# Patient Record
Sex: Female | Born: 1945 | Race: White | Hispanic: No | Marital: Married | Smoking: Never smoker
Health system: Southern US, Community
[De-identification: ages and names within clinical notes are randomized; demographics above are authoritative.]

## PROBLEM LIST (undated history)

## (undated) DIAGNOSIS — G5790 Unspecified mononeuropathy of unspecified lower limb: Secondary | ICD-10-CM

## (undated) DIAGNOSIS — E669 Obesity, unspecified: Secondary | ICD-10-CM

## (undated) DIAGNOSIS — K219 Gastro-esophageal reflux disease without esophagitis: Secondary | ICD-10-CM

## (undated) DIAGNOSIS — I10 Essential (primary) hypertension: Secondary | ICD-10-CM

## (undated) DIAGNOSIS — R51 Headache: Secondary | ICD-10-CM

## (undated) DIAGNOSIS — K5790 Diverticulosis of intestine, part unspecified, without perforation or abscess without bleeding: Secondary | ICD-10-CM

## (undated) DIAGNOSIS — E039 Hypothyroidism, unspecified: Secondary | ICD-10-CM

## (undated) DIAGNOSIS — C50919 Malignant neoplasm of unspecified site of unspecified female breast: Secondary | ICD-10-CM

## (undated) DIAGNOSIS — K635 Polyp of colon: Secondary | ICD-10-CM

## (undated) DIAGNOSIS — E785 Hyperlipidemia, unspecified: Secondary | ICD-10-CM

## (undated) DIAGNOSIS — I4891 Unspecified atrial fibrillation: Secondary | ICD-10-CM

## (undated) DIAGNOSIS — M1990 Unspecified osteoarthritis, unspecified site: Secondary | ICD-10-CM

## (undated) DIAGNOSIS — Z8679 Personal history of other diseases of the circulatory system: Secondary | ICD-10-CM

## (undated) DIAGNOSIS — N281 Cyst of kidney, acquired: Secondary | ICD-10-CM

## (undated) DIAGNOSIS — K760 Fatty (change of) liver, not elsewhere classified: Secondary | ICD-10-CM

## (undated) DIAGNOSIS — C73 Malignant neoplasm of thyroid gland: Secondary | ICD-10-CM

## (undated) DIAGNOSIS — R519 Headache, unspecified: Secondary | ICD-10-CM

## (undated) DIAGNOSIS — K589 Irritable bowel syndrome without diarrhea: Secondary | ICD-10-CM

## (undated) DIAGNOSIS — I499 Cardiac arrhythmia, unspecified: Secondary | ICD-10-CM

## (undated) HISTORY — DX: Essential (primary) hypertension: I10

## (undated) HISTORY — DX: Polyp of colon: K63.5

## (undated) HISTORY — DX: Cyst of kidney, acquired: N28.1

## (undated) HISTORY — DX: Unspecified atrial fibrillation: I48.91

## (undated) HISTORY — DX: Obesity, unspecified: E66.9

## (undated) HISTORY — PX: TONSILLECTOMY: SUR1361

## (undated) HISTORY — PX: COLONOSCOPY W/ POLYPECTOMY: SHX1380

## (undated) HISTORY — PX: CHOLECYSTECTOMY: SHX55

## (undated) HISTORY — DX: Hyperlipidemia, unspecified: E78.5

## (undated) HISTORY — DX: Fatty (change of) liver, not elsewhere classified: K76.0

## (undated) HISTORY — PX: JOINT REPLACEMENT: SHX530

## (undated) HISTORY — DX: Unspecified mononeuropathy of unspecified lower limb: G57.90

## (undated) HISTORY — DX: Personal history of other diseases of the circulatory system: Z86.79

## (undated) HISTORY — DX: Diverticulosis of intestine, part unspecified, without perforation or abscess without bleeding: K57.90

## (undated) HISTORY — DX: Malignant neoplasm of unspecified site of unspecified female breast: C50.919

## (undated) HISTORY — DX: Hypothyroidism, unspecified: E03.9

## (undated) HISTORY — DX: Irritable bowel syndrome, unspecified: K58.9

---

## 1997-09-20 ENCOUNTER — Ambulatory Visit (HOSPITAL_COMMUNITY): Admission: RE | Admit: 1997-09-20 | Discharge: 1997-09-20 | Payer: Self-pay | Admitting: Cardiology

## 1998-04-09 HISTORY — PX: BREAST LUMPECTOMY: SHX2

## 1998-08-29 ENCOUNTER — Other Ambulatory Visit: Admission: RE | Admit: 1998-08-29 | Discharge: 1998-08-29 | Payer: Self-pay | Admitting: Obstetrics and Gynecology

## 1998-09-13 ENCOUNTER — Encounter: Payer: Self-pay | Admitting: Obstetrics and Gynecology

## 1998-09-13 ENCOUNTER — Ambulatory Visit (HOSPITAL_COMMUNITY): Admission: RE | Admit: 1998-09-13 | Discharge: 1998-09-13 | Payer: Self-pay | Admitting: Obstetrics and Gynecology

## 1998-11-07 ENCOUNTER — Ambulatory Visit (HOSPITAL_BASED_OUTPATIENT_CLINIC_OR_DEPARTMENT_OTHER): Admission: RE | Admit: 1998-11-07 | Discharge: 1998-11-07 | Payer: Self-pay | Admitting: Surgery

## 1998-11-25 ENCOUNTER — Ambulatory Visit (HOSPITAL_BASED_OUTPATIENT_CLINIC_OR_DEPARTMENT_OTHER): Admission: RE | Admit: 1998-11-25 | Discharge: 1998-11-25 | Payer: Self-pay | Admitting: Surgery

## 1998-12-05 ENCOUNTER — Encounter: Admission: RE | Admit: 1998-12-05 | Discharge: 1999-03-05 | Payer: Self-pay | Admitting: Radiation Oncology

## 1999-02-17 ENCOUNTER — Other Ambulatory Visit: Admission: RE | Admit: 1999-02-17 | Discharge: 1999-02-17 | Payer: Self-pay | Admitting: Obstetrics and Gynecology

## 1999-03-08 ENCOUNTER — Encounter: Admission: RE | Admit: 1999-03-08 | Discharge: 1999-06-06 | Payer: Self-pay | Admitting: Radiation Oncology

## 1999-09-01 ENCOUNTER — Other Ambulatory Visit: Admission: RE | Admit: 1999-09-01 | Discharge: 1999-09-01 | Payer: Self-pay | Admitting: Obstetrics and Gynecology

## 1999-11-27 ENCOUNTER — Encounter (INDEPENDENT_AMBULATORY_CARE_PROVIDER_SITE_OTHER): Payer: Self-pay | Admitting: Specialist

## 1999-11-27 ENCOUNTER — Other Ambulatory Visit: Admission: RE | Admit: 1999-11-27 | Discharge: 1999-11-27 | Payer: Self-pay | Admitting: Obstetrics and Gynecology

## 2000-07-31 ENCOUNTER — Ambulatory Visit (HOSPITAL_BASED_OUTPATIENT_CLINIC_OR_DEPARTMENT_OTHER): Admission: RE | Admit: 2000-07-31 | Discharge: 2000-07-31 | Payer: Self-pay | Admitting: *Deleted

## 2000-10-09 ENCOUNTER — Encounter: Payer: Self-pay | Admitting: Hematology and Oncology

## 2000-10-09 ENCOUNTER — Ambulatory Visit (HOSPITAL_COMMUNITY): Admission: RE | Admit: 2000-10-09 | Discharge: 2000-10-09 | Payer: Self-pay | Admitting: Hematology and Oncology

## 2000-10-30 ENCOUNTER — Encounter: Admission: RE | Admit: 2000-10-30 | Discharge: 2000-10-30 | Payer: Self-pay | Admitting: Hematology and Oncology

## 2000-10-30 ENCOUNTER — Encounter: Payer: Self-pay | Admitting: Hematology and Oncology

## 2000-12-17 ENCOUNTER — Other Ambulatory Visit: Admission: RE | Admit: 2000-12-17 | Discharge: 2000-12-17 | Payer: Self-pay | Admitting: Obstetrics and Gynecology

## 2001-03-12 ENCOUNTER — Encounter: Admission: RE | Admit: 2001-03-12 | Discharge: 2001-03-12 | Payer: Self-pay | Admitting: Obstetrics and Gynecology

## 2001-03-12 ENCOUNTER — Encounter: Payer: Self-pay | Admitting: Obstetrics and Gynecology

## 2001-03-25 ENCOUNTER — Encounter: Payer: Self-pay | Admitting: Obstetrics and Gynecology

## 2001-03-25 ENCOUNTER — Encounter: Admission: RE | Admit: 2001-03-25 | Discharge: 2001-03-25 | Payer: Self-pay | Admitting: Obstetrics and Gynecology

## 2001-03-26 ENCOUNTER — Encounter: Admission: RE | Admit: 2001-03-26 | Discharge: 2001-03-26 | Payer: Self-pay | Admitting: Obstetrics and Gynecology

## 2001-03-26 ENCOUNTER — Encounter: Payer: Self-pay | Admitting: Obstetrics and Gynecology

## 2002-05-15 ENCOUNTER — Other Ambulatory Visit: Admission: RE | Admit: 2002-05-15 | Discharge: 2002-05-15 | Payer: Self-pay | Admitting: Obstetrics and Gynecology

## 2002-10-30 ENCOUNTER — Encounter: Admission: RE | Admit: 2002-10-30 | Discharge: 2002-10-30 | Payer: Self-pay | Admitting: Oncology

## 2002-10-30 ENCOUNTER — Encounter: Payer: Self-pay | Admitting: Oncology

## 2003-07-01 ENCOUNTER — Other Ambulatory Visit: Admission: RE | Admit: 2003-07-01 | Discharge: 2003-07-01 | Payer: Self-pay | Admitting: Obstetrics and Gynecology

## 2003-08-31 ENCOUNTER — Encounter: Admission: RE | Admit: 2003-08-31 | Discharge: 2003-08-31 | Payer: Self-pay | Admitting: Obstetrics and Gynecology

## 2003-09-03 ENCOUNTER — Encounter: Admission: RE | Admit: 2003-09-03 | Discharge: 2003-09-03 | Payer: Self-pay | Admitting: Obstetrics and Gynecology

## 2003-09-03 ENCOUNTER — Encounter (INDEPENDENT_AMBULATORY_CARE_PROVIDER_SITE_OTHER): Payer: Self-pay | Admitting: Diagnostic Radiology

## 2003-09-03 ENCOUNTER — Encounter (INDEPENDENT_AMBULATORY_CARE_PROVIDER_SITE_OTHER): Payer: Self-pay | Admitting: *Deleted

## 2003-09-14 ENCOUNTER — Encounter: Admission: RE | Admit: 2003-09-14 | Discharge: 2003-09-14 | Payer: Self-pay | Admitting: Surgery

## 2003-10-18 ENCOUNTER — Encounter: Admission: RE | Admit: 2003-10-18 | Discharge: 2003-10-18 | Payer: Self-pay | Admitting: Surgery

## 2003-10-22 ENCOUNTER — Encounter (INDEPENDENT_AMBULATORY_CARE_PROVIDER_SITE_OTHER): Payer: Self-pay | Admitting: *Deleted

## 2003-10-22 ENCOUNTER — Observation Stay (HOSPITAL_COMMUNITY): Admission: RE | Admit: 2003-10-22 | Discharge: 2003-10-22 | Payer: Self-pay | Admitting: Surgery

## 2003-10-22 ENCOUNTER — Encounter: Admission: RE | Admit: 2003-10-22 | Discharge: 2003-10-22 | Payer: Self-pay | Admitting: Surgery

## 2004-02-14 ENCOUNTER — Ambulatory Visit: Payer: Self-pay | Admitting: Internal Medicine

## 2004-02-15 ENCOUNTER — Encounter: Admission: RE | Admit: 2004-02-15 | Discharge: 2004-02-15 | Payer: Self-pay | Admitting: Cardiology

## 2004-04-18 ENCOUNTER — Encounter: Admission: RE | Admit: 2004-04-18 | Discharge: 2004-04-18 | Payer: Self-pay | Admitting: Surgery

## 2004-06-05 ENCOUNTER — Emergency Department (HOSPITAL_COMMUNITY): Admission: EM | Admit: 2004-06-05 | Discharge: 2004-06-05 | Payer: Self-pay | Admitting: Emergency Medicine

## 2004-07-11 ENCOUNTER — Other Ambulatory Visit: Admission: RE | Admit: 2004-07-11 | Discharge: 2004-07-11 | Payer: Self-pay | Admitting: Obstetrics and Gynecology

## 2004-07-17 ENCOUNTER — Ambulatory Visit: Payer: Self-pay | Admitting: Internal Medicine

## 2004-07-26 ENCOUNTER — Ambulatory Visit: Payer: Self-pay | Admitting: Internal Medicine

## 2004-08-01 ENCOUNTER — Ambulatory Visit: Payer: Self-pay | Admitting: Family Medicine

## 2004-11-22 ENCOUNTER — Ambulatory Visit: Payer: Self-pay | Admitting: Oncology

## 2004-11-28 ENCOUNTER — Encounter: Admission: RE | Admit: 2004-11-28 | Discharge: 2004-11-28 | Payer: Self-pay | Admitting: Obstetrics and Gynecology

## 2004-12-08 ENCOUNTER — Ambulatory Visit: Payer: Self-pay | Admitting: Internal Medicine

## 2005-01-09 ENCOUNTER — Encounter: Admission: RE | Admit: 2005-01-09 | Discharge: 2005-01-09 | Payer: Self-pay | Admitting: Obstetrics and Gynecology

## 2005-01-16 ENCOUNTER — Ambulatory Visit: Payer: Self-pay | Admitting: Internal Medicine

## 2005-07-16 ENCOUNTER — Other Ambulatory Visit: Admission: RE | Admit: 2005-07-16 | Discharge: 2005-07-16 | Payer: Self-pay | Admitting: Obstetrics and Gynecology

## 2005-07-27 ENCOUNTER — Ambulatory Visit: Payer: Self-pay | Admitting: Internal Medicine

## 2005-08-03 ENCOUNTER — Ambulatory Visit: Payer: Self-pay | Admitting: Internal Medicine

## 2005-08-10 ENCOUNTER — Encounter: Admission: RE | Admit: 2005-08-10 | Discharge: 2005-08-10 | Payer: Self-pay | Admitting: Internal Medicine

## 2005-11-02 ENCOUNTER — Ambulatory Visit: Payer: Self-pay | Admitting: Internal Medicine

## 2005-11-21 ENCOUNTER — Ambulatory Visit: Payer: Self-pay | Admitting: Oncology

## 2005-11-26 LAB — CBC WITH DIFFERENTIAL/PLATELET
Eosinophils Absolute: 0.1 10*3/uL (ref 0.0–0.5)
MCV: 95.9 fL (ref 81.0–101.0)
MONO%: 7.1 % (ref 0.0–13.0)
NEUT#: 5.9 10*3/uL (ref 1.5–6.5)
RBC: 4.02 10*6/uL (ref 3.70–5.32)
RDW: 12 % (ref 11.3–14.5)
WBC: 9.1 10*3/uL (ref 3.9–10.0)

## 2005-11-26 LAB — COMPREHENSIVE METABOLIC PANEL
AST: 41 U/L — ABNORMAL HIGH (ref 0–37)
Albumin: 4.5 g/dL (ref 3.5–5.2)
Alkaline Phosphatase: 99 U/L (ref 39–117)
Glucose, Bld: 91 mg/dL (ref 70–99)
Potassium: 3.9 mEq/L (ref 3.5–5.3)
Sodium: 141 mEq/L (ref 135–145)
Total Protein: 7.4 g/dL (ref 6.0–8.3)

## 2005-11-30 ENCOUNTER — Encounter (INDEPENDENT_AMBULATORY_CARE_PROVIDER_SITE_OTHER): Payer: Self-pay | Admitting: Diagnostic Radiology

## 2005-11-30 ENCOUNTER — Encounter (INDEPENDENT_AMBULATORY_CARE_PROVIDER_SITE_OTHER): Payer: Self-pay | Admitting: Specialist

## 2005-11-30 ENCOUNTER — Encounter: Admission: RE | Admit: 2005-11-30 | Discharge: 2005-11-30 | Payer: Self-pay | Admitting: Oncology

## 2005-12-25 ENCOUNTER — Ambulatory Visit: Admission: RE | Admit: 2005-12-25 | Discharge: 2006-03-01 | Payer: Self-pay | Admitting: Radiation Oncology

## 2005-12-31 ENCOUNTER — Encounter: Admission: RE | Admit: 2005-12-31 | Discharge: 2005-12-31 | Payer: Self-pay | Admitting: Oncology

## 2006-01-10 ENCOUNTER — Ambulatory Visit: Payer: Self-pay | Admitting: Internal Medicine

## 2006-01-10 ENCOUNTER — Ambulatory Visit: Payer: Self-pay | Admitting: Oncology

## 2006-01-30 ENCOUNTER — Encounter (INDEPENDENT_AMBULATORY_CARE_PROVIDER_SITE_OTHER): Payer: Self-pay | Admitting: Specialist

## 2006-01-30 ENCOUNTER — Ambulatory Visit (HOSPITAL_BASED_OUTPATIENT_CLINIC_OR_DEPARTMENT_OTHER): Admission: RE | Admit: 2006-01-30 | Discharge: 2006-01-30 | Payer: Self-pay | Admitting: Surgery

## 2006-01-30 ENCOUNTER — Encounter: Admission: RE | Admit: 2006-01-30 | Discharge: 2006-01-30 | Payer: Self-pay | Admitting: Surgery

## 2006-02-14 ENCOUNTER — Ambulatory Visit (HOSPITAL_COMMUNITY): Admission: RE | Admit: 2006-02-14 | Discharge: 2006-02-14 | Payer: Self-pay | Admitting: Surgery

## 2006-02-22 ENCOUNTER — Encounter: Payer: Self-pay | Admitting: Internal Medicine

## 2006-03-01 ENCOUNTER — Ambulatory Visit: Payer: Self-pay | Admitting: Oncology

## 2006-03-29 ENCOUNTER — Ambulatory Visit: Payer: Self-pay | Admitting: Internal Medicine

## 2006-03-29 LAB — CONVERTED CEMR LAB
HDL: 34.5 mg/dL — ABNORMAL LOW (ref >39.0)
Triglyceride fasting, serum: 293 mg/dL (ref 0–149)

## 2006-04-05 LAB — COMPREHENSIVE METABOLIC PANEL
AST: 73 U/L — ABNORMAL HIGH (ref 0–37)
Albumin: 4.5 g/dL (ref 3.5–5.2)
BUN: 16 mg/dL (ref 6–23)
CO2: 25 mEq/L (ref 19–32)
Calcium: 9 mg/dL (ref 8.4–10.5)
Chloride: 102 mEq/L (ref 96–112)
Glucose, Bld: 133 mg/dL — ABNORMAL HIGH (ref 70–99)
Potassium: 3.6 mEq/L (ref 3.5–5.3)

## 2006-04-05 LAB — CBC WITH DIFFERENTIAL/PLATELET
Basophils Absolute: 0 10*3/uL (ref 0.0–0.1)
Eosinophils Absolute: 0.1 10*3/uL (ref 0.0–0.5)
HCT: 38.9 % (ref 34.8–46.6)
HGB: 13.9 g/dL (ref 11.6–15.9)
MONO#: 0.5 10*3/uL (ref 0.1–0.9)
NEUT#: 4.6 10*3/uL (ref 1.5–6.5)
RDW: 12.3 % (ref 11.3–14.5)
lymph#: 1.7 10*3/uL (ref 0.9–3.3)

## 2006-04-05 LAB — LACTATE DEHYDROGENASE: LDH: 176 U/L (ref 94–250)

## 2006-04-29 ENCOUNTER — Ambulatory Visit: Payer: Self-pay | Admitting: Oncology

## 2006-04-30 LAB — HEPATIC FUNCTION PANEL
ALT: 53 U/L — ABNORMAL HIGH (ref 0–35)
AST: 45 U/L — ABNORMAL HIGH (ref 0–37)
Albumin: 4.5 g/dL (ref 3.5–5.2)
Alkaline Phosphatase: 103 U/L (ref 39–117)
Total Protein: 7.3 g/dL (ref 6.0–8.3)

## 2006-04-30 LAB — HEPATITIS B SURFACE ANTIGEN: Hepatitis B Surface Ag: NEGATIVE

## 2006-05-09 DIAGNOSIS — I10 Essential (primary) hypertension: Secondary | ICD-10-CM | POA: Insufficient documentation

## 2006-05-09 DIAGNOSIS — K219 Gastro-esophageal reflux disease without esophagitis: Secondary | ICD-10-CM

## 2006-05-23 LAB — CBC WITH DIFFERENTIAL/PLATELET
Basophils Absolute: 0 10*3/uL (ref 0.0–0.1)
EOS%: 1.5 % (ref 0.0–7.0)
Eosinophils Absolute: 0.1 10*3/uL (ref 0.0–0.5)
HCT: UNDETERMINED % (ref 34.8–46.6)
HGB: 14.1 g/dL (ref 11.6–15.9)
MCH: UNDETERMINED pg (ref 26.0–34.0)
MCV: UNDETERMINED fL (ref 81.0–101.0)
MONO%: 8.1 % (ref 0.0–13.0)
NEUT#: 3.5 10*3/uL (ref 1.5–6.5)
NEUT%: 57.4 % (ref 39.6–76.8)
RDW: UNDETERMINED % (ref 11.3–14.5)

## 2006-05-23 LAB — COMPREHENSIVE METABOLIC PANEL
Albumin: 4.3 g/dL (ref 3.5–5.2)
BUN: 13 mg/dL (ref 6–23)
CO2: 27 mEq/L (ref 19–32)
Glucose, Bld: 91 mg/dL (ref 70–99)
Potassium: 3.9 mEq/L (ref 3.5–5.3)
Sodium: 140 mEq/L (ref 135–145)
Total Bilirubin: 1.2 mg/dL (ref 0.3–1.2)
Total Protein: 7 g/dL (ref 6.0–8.3)

## 2006-06-25 ENCOUNTER — Ambulatory Visit: Payer: Self-pay | Admitting: Oncology

## 2006-07-01 LAB — CBC WITH DIFFERENTIAL/PLATELET
Basophils Absolute: 0 10*3/uL (ref 0.0–0.1)
EOS%: 1.8 % (ref 0.0–7.0)
Eosinophils Absolute: 0.1 10*3/uL (ref 0.0–0.5)
HCT: UNDETERMINED % (ref 34.8–46.6)
HGB: 13.4 g/dL (ref 11.6–15.9)
MCH: UNDETERMINED pg (ref 26.0–34.0)
MCV: UNDETERMINED fL (ref 81.0–101.0)
MONO%: 9.5 % (ref 0.0–13.0)
NEUT#: 3.5 10*3/uL (ref 1.5–6.5)
NEUT%: 57.1 % (ref 39.6–76.8)
RDW: 12 % (ref 11.3–14.5)

## 2006-07-01 LAB — COMPREHENSIVE METABOLIC PANEL
Albumin: 4.2 g/dL (ref 3.5–5.2)
BUN: 13 mg/dL (ref 6–23)
Calcium: 9.3 mg/dL (ref 8.4–10.5)
Chloride: 103 mEq/L (ref 96–112)
Creatinine, Ser: 0.54 mg/dL (ref 0.40–1.20)
Glucose, Bld: 92 mg/dL (ref 70–99)
Potassium: 3.7 mEq/L (ref 3.5–5.3)

## 2006-07-01 LAB — GAMMA GT: GGT: 41 U/L (ref 7–51)

## 2006-08-09 ENCOUNTER — Ambulatory Visit: Payer: Self-pay | Admitting: Internal Medicine

## 2006-09-11 ENCOUNTER — Telehealth: Payer: Self-pay | Admitting: Internal Medicine

## 2006-11-15 ENCOUNTER — Ambulatory Visit: Payer: Self-pay | Admitting: Internal Medicine

## 2006-11-23 LAB — CONVERTED CEMR LAB: TSH: 4.69 microintl units/mL (ref 0.35–5.50)

## 2006-11-25 ENCOUNTER — Encounter (INDEPENDENT_AMBULATORY_CARE_PROVIDER_SITE_OTHER): Payer: Self-pay | Admitting: *Deleted

## 2006-12-04 ENCOUNTER — Ambulatory Visit: Payer: Self-pay | Admitting: Oncology

## 2006-12-06 ENCOUNTER — Encounter: Admission: RE | Admit: 2006-12-06 | Discharge: 2006-12-06 | Payer: Self-pay | Admitting: Oncology

## 2006-12-06 ENCOUNTER — Encounter: Payer: Self-pay | Admitting: Internal Medicine

## 2006-12-06 LAB — COMPREHENSIVE METABOLIC PANEL
ALT: 74 U/L — ABNORMAL HIGH (ref 0–35)
AST: 70 U/L — ABNORMAL HIGH (ref 0–37)
Albumin: 4.2 g/dL (ref 3.5–5.2)
Alkaline Phosphatase: 102 U/L (ref 39–117)
Calcium: 9.7 mg/dL (ref 8.4–10.5)
Chloride: 104 mEq/L (ref 96–112)
Potassium: 3.8 mEq/L (ref 3.5–5.3)

## 2006-12-06 LAB — CBC WITH DIFFERENTIAL/PLATELET
BASO%: 1.1 % (ref 0.0–2.0)
Eosinophils Absolute: 0.1 10*3/uL (ref 0.0–0.5)
MCHC: 36.4 g/dL — ABNORMAL HIGH (ref 32.0–36.0)
MONO#: 0.6 10*3/uL (ref 0.1–0.9)
NEUT#: 3.7 10*3/uL (ref 1.5–6.5)
RBC: 4.01 10*6/uL (ref 3.70–5.32)
RDW: 12 % (ref 11.3–14.5)
WBC: 6.4 10*3/uL (ref 3.9–10.0)
lymph#: 2 10*3/uL (ref 0.9–3.3)

## 2006-12-27 ENCOUNTER — Encounter: Payer: Self-pay | Admitting: Internal Medicine

## 2006-12-30 ENCOUNTER — Encounter: Payer: Self-pay | Admitting: Internal Medicine

## 2006-12-30 LAB — HEPATITIS C ANTIBODY: HCV Ab: NEGATIVE

## 2007-01-10 ENCOUNTER — Encounter: Admission: RE | Admit: 2007-01-10 | Discharge: 2007-01-10 | Payer: Self-pay | Admitting: Oncology

## 2007-01-10 ENCOUNTER — Encounter: Payer: Self-pay | Admitting: Internal Medicine

## 2007-01-10 DIAGNOSIS — K760 Fatty (change of) liver, not elsewhere classified: Secondary | ICD-10-CM

## 2007-01-10 HISTORY — DX: Fatty (change of) liver, not elsewhere classified: K76.0

## 2007-01-24 ENCOUNTER — Telehealth (INDEPENDENT_AMBULATORY_CARE_PROVIDER_SITE_OTHER): Payer: Self-pay | Admitting: *Deleted

## 2007-05-16 ENCOUNTER — Ambulatory Visit: Payer: Self-pay | Admitting: Internal Medicine

## 2007-05-31 LAB — CONVERTED CEMR LAB: TSH: 3.75 microintl units/mL (ref 0.35–5.50)

## 2007-06-02 ENCOUNTER — Encounter (INDEPENDENT_AMBULATORY_CARE_PROVIDER_SITE_OTHER): Payer: Self-pay | Admitting: *Deleted

## 2007-06-11 ENCOUNTER — Ambulatory Visit: Payer: Self-pay | Admitting: Oncology

## 2007-06-13 ENCOUNTER — Encounter: Payer: Self-pay | Admitting: Internal Medicine

## 2007-06-13 LAB — CBC WITH DIFFERENTIAL/PLATELET
Basophils Absolute: 0 10*3/uL (ref 0.0–0.1)
EOS%: 0.9 % (ref 0.0–7.0)
HCT: 40.1 % (ref 34.8–46.6)
HGB: 14.6 g/dL (ref 11.6–15.9)
LYMPH%: 27.8 % (ref 14.0–48.0)
MCH: 34.5 pg — ABNORMAL HIGH (ref 26.0–34.0)
MONO#: 0.6 10*3/uL (ref 0.1–0.9)
NEUT%: 62.7 % (ref 39.6–76.8)
Platelets: 233 10*3/uL (ref 145–400)
lymph#: 2.2 10*3/uL (ref 0.9–3.3)

## 2007-06-13 LAB — LACTATE DEHYDROGENASE: LDH: 171 U/L (ref 94–250)

## 2007-06-13 LAB — COMPREHENSIVE METABOLIC PANEL
BUN: 16 mg/dL (ref 6–23)
CO2: 22 mEq/L (ref 19–32)
Calcium: 9.1 mg/dL (ref 8.4–10.5)
Chloride: 106 mEq/L (ref 96–112)
Creatinine, Ser: 0.65 mg/dL (ref 0.40–1.20)
Total Bilirubin: 1.1 mg/dL (ref 0.3–1.2)

## 2007-06-24 ENCOUNTER — Telehealth (INDEPENDENT_AMBULATORY_CARE_PROVIDER_SITE_OTHER): Payer: Self-pay | Admitting: *Deleted

## 2007-07-28 ENCOUNTER — Ambulatory Visit: Payer: Self-pay | Admitting: Oncology

## 2007-09-05 ENCOUNTER — Other Ambulatory Visit: Admission: RE | Admit: 2007-09-05 | Discharge: 2007-09-05 | Payer: Self-pay | Admitting: Obstetrics and Gynecology

## 2007-10-03 ENCOUNTER — Encounter: Payer: Self-pay | Admitting: Internal Medicine

## 2007-12-19 ENCOUNTER — Encounter: Admission: RE | Admit: 2007-12-19 | Discharge: 2007-12-19 | Payer: Self-pay | Admitting: Oncology

## 2008-02-18 ENCOUNTER — Ambulatory Visit: Payer: Self-pay | Admitting: Oncology

## 2008-02-23 ENCOUNTER — Encounter: Payer: Self-pay | Admitting: Internal Medicine

## 2008-02-23 LAB — CBC WITH DIFFERENTIAL/PLATELET
BASO%: 0.3 % (ref 0.0–2.0)
EOS%: 1.1 % (ref 0.0–7.0)
HCT: 39.9 % (ref 34.8–46.6)
LYMPH%: 28.8 % (ref 14.0–48.0)
MCH: 34.9 pg — ABNORMAL HIGH (ref 26.0–34.0)
MCHC: 35.9 g/dL (ref 32.0–36.0)
MCV: 97.2 fL (ref 81.0–101.0)
MONO%: 8.7 % (ref 0.0–13.0)
NEUT%: 61.1 % (ref 39.6–76.8)
Platelets: 217 10*3/uL (ref 145–400)
RBC: 4.11 10*6/uL (ref 3.70–5.32)
WBC: 7.5 10*3/uL (ref 3.9–10.0)

## 2008-02-23 LAB — COMPREHENSIVE METABOLIC PANEL
ALT: 106 U/L — ABNORMAL HIGH (ref 0–35)
AST: 144 U/L — ABNORMAL HIGH (ref 0–37)
Alkaline Phosphatase: 95 U/L (ref 39–117)
CO2: 27 mEq/L (ref 19–32)
Creatinine, Ser: 0.77 mg/dL (ref 0.40–1.20)
Sodium: 138 mEq/L (ref 135–145)
Total Bilirubin: 1.1 mg/dL (ref 0.3–1.2)
Total Protein: 7.3 g/dL (ref 6.0–8.3)

## 2008-02-23 LAB — LACTATE DEHYDROGENASE: LDH: 227 U/L (ref 94–250)

## 2008-02-27 ENCOUNTER — Encounter: Payer: Self-pay | Admitting: Internal Medicine

## 2008-03-12 ENCOUNTER — Ambulatory Visit: Payer: Self-pay | Admitting: Internal Medicine

## 2008-03-12 DIAGNOSIS — Z860100 Personal history of colon polyps, unspecified: Secondary | ICD-10-CM | POA: Insufficient documentation

## 2008-03-12 DIAGNOSIS — Z853 Personal history of malignant neoplasm of breast: Secondary | ICD-10-CM

## 2008-03-12 DIAGNOSIS — E890 Postprocedural hypothyroidism: Secondary | ICD-10-CM

## 2008-03-19 ENCOUNTER — Encounter: Payer: Self-pay | Admitting: Internal Medicine

## 2008-03-19 LAB — HEPATIC FUNCTION PANEL
ALT: 81 U/L — ABNORMAL HIGH (ref 0–35)
AST: 81 U/L — ABNORMAL HIGH (ref 0–37)
Bilirubin, Direct: 0.2 mg/dL (ref 0.0–0.3)
Indirect Bilirubin: 1.1 mg/dL — ABNORMAL HIGH (ref 0.0–0.9)
Total Protein: 7.3 g/dL (ref 6.0–8.3)

## 2008-04-12 ENCOUNTER — Telehealth (INDEPENDENT_AMBULATORY_CARE_PROVIDER_SITE_OTHER): Payer: Self-pay | Admitting: *Deleted

## 2008-08-11 ENCOUNTER — Ambulatory Visit: Payer: Self-pay | Admitting: Oncology

## 2008-08-13 ENCOUNTER — Encounter: Payer: Self-pay | Admitting: Internal Medicine

## 2008-08-13 LAB — COMPREHENSIVE METABOLIC PANEL
ALT: 87 U/L — ABNORMAL HIGH (ref 0–35)
AST: 90 U/L — ABNORMAL HIGH (ref 0–37)
Alkaline Phosphatase: 79 U/L (ref 39–117)
BUN: 13 mg/dL (ref 6–23)
Calcium: 9.5 mg/dL (ref 8.4–10.5)
Chloride: 101 mEq/L (ref 96–112)
Creatinine, Ser: 0.66 mg/dL (ref 0.40–1.20)
Potassium: 3.6 mEq/L (ref 3.5–5.3)

## 2008-08-13 LAB — CBC WITH DIFFERENTIAL/PLATELET
BASO%: 0.2 % (ref 0.0–2.0)
EOS%: 1 % (ref 0.0–7.0)
MCH: 34 pg (ref 25.1–34.0)
MCHC: 35.5 g/dL (ref 31.5–36.0)
MCV: 95.7 fL (ref 79.5–101.0)
MONO%: 7.3 % (ref 0.0–14.0)
NEUT%: 59.8 % (ref 38.4–76.8)
RDW: 12 % (ref 11.2–14.5)
lymph#: 2.3 10*3/uL (ref 0.9–3.3)

## 2008-08-20 ENCOUNTER — Encounter: Payer: Self-pay | Admitting: Internal Medicine

## 2009-03-09 ENCOUNTER — Ambulatory Visit: Payer: Self-pay | Admitting: Oncology

## 2009-03-11 ENCOUNTER — Encounter: Admission: RE | Admit: 2009-03-11 | Discharge: 2009-03-11 | Payer: Self-pay | Admitting: Oncology

## 2009-03-11 LAB — COMPREHENSIVE METABOLIC PANEL
ALT: 64 U/L — ABNORMAL HIGH (ref 0–35)
AST: 66 U/L — ABNORMAL HIGH (ref 0–37)
CO2: 24 mEq/L (ref 19–32)
Calcium: 9.1 mg/dL (ref 8.4–10.5)
Chloride: 104 mEq/L (ref 96–112)
Potassium: 4.2 mEq/L (ref 3.5–5.3)
Sodium: 139 mEq/L (ref 135–145)
Total Protein: 7.3 g/dL (ref 6.0–8.3)

## 2009-03-11 LAB — CBC WITH DIFFERENTIAL/PLATELET
BASO%: 1.4 % (ref 0.0–2.0)
HCT: 39.5 % (ref 34.8–46.6)
MCHC: 35.4 g/dL (ref 31.5–36.0)
MONO#: 0.5 10*3/uL (ref 0.1–0.9)
NEUT%: 60.4 % (ref 38.4–76.8)
RBC: 4.02 10*6/uL (ref 3.70–5.45)
RDW: 12.2 % (ref 11.2–14.5)
WBC: 7.2 10*3/uL (ref 3.9–10.3)
lymph#: 2.1 10*3/uL (ref 0.9–3.3)

## 2009-03-11 LAB — LACTATE DEHYDROGENASE: LDH: 169 U/L (ref 94–250)

## 2009-03-18 ENCOUNTER — Encounter: Payer: Self-pay | Admitting: Internal Medicine

## 2009-03-18 ENCOUNTER — Other Ambulatory Visit: Admission: RE | Admit: 2009-03-18 | Discharge: 2009-03-18 | Payer: Self-pay | Admitting: Obstetrics and Gynecology

## 2009-04-05 ENCOUNTER — Encounter (INDEPENDENT_AMBULATORY_CARE_PROVIDER_SITE_OTHER): Payer: Self-pay | Admitting: *Deleted

## 2009-04-06 ENCOUNTER — Telehealth (INDEPENDENT_AMBULATORY_CARE_PROVIDER_SITE_OTHER): Payer: Self-pay | Admitting: *Deleted

## 2009-05-27 ENCOUNTER — Ambulatory Visit: Payer: Self-pay | Admitting: Internal Medicine

## 2009-05-27 DIAGNOSIS — E785 Hyperlipidemia, unspecified: Secondary | ICD-10-CM

## 2009-05-27 DIAGNOSIS — R740 Nonspecific elevation of levels of transaminase and lactic acid dehydrogenase [LDH]: Secondary | ICD-10-CM

## 2009-05-27 DIAGNOSIS — E8881 Metabolic syndrome: Secondary | ICD-10-CM | POA: Insufficient documentation

## 2009-05-27 LAB — CONVERTED CEMR LAB
HDL goal, serum: 40 mg/dL
LDL Goal: 130 mg/dL

## 2009-06-10 ENCOUNTER — Encounter: Payer: Self-pay | Admitting: Internal Medicine

## 2009-06-16 ENCOUNTER — Encounter: Payer: Self-pay | Admitting: Internal Medicine

## 2009-08-18 ENCOUNTER — Ambulatory Visit: Payer: Self-pay | Admitting: Oncology

## 2009-08-19 ENCOUNTER — Encounter: Payer: Self-pay | Admitting: Internal Medicine

## 2009-08-19 LAB — CBC WITH DIFFERENTIAL/PLATELET
BASO%: 0.2 % (ref 0.0–2.0)
EOS%: 1 % (ref 0.0–7.0)
MCH: 34.4 pg — ABNORMAL HIGH (ref 25.1–34.0)
MCHC: 35.7 g/dL (ref 31.5–36.0)
RBC: 4.03 10*6/uL (ref 3.70–5.45)
RDW: 12 % (ref 11.2–14.5)
lymph#: 2.2 10*3/uL (ref 0.9–3.3)

## 2009-08-19 LAB — COMPREHENSIVE METABOLIC PANEL
ALT: 38 U/L — ABNORMAL HIGH (ref 0–35)
AST: 43 U/L — ABNORMAL HIGH (ref 0–37)
Albumin: 4 g/dL (ref 3.5–5.2)
Calcium: 9.2 mg/dL (ref 8.4–10.5)
Chloride: 105 mEq/L (ref 96–112)
Creatinine, Ser: 0.6 mg/dL (ref 0.40–1.20)
Potassium: 3.7 mEq/L (ref 3.5–5.3)

## 2009-08-26 ENCOUNTER — Encounter: Payer: Self-pay | Admitting: Internal Medicine

## 2009-10-12 ENCOUNTER — Encounter (INDEPENDENT_AMBULATORY_CARE_PROVIDER_SITE_OTHER): Payer: Self-pay | Admitting: *Deleted

## 2009-10-19 ENCOUNTER — Encounter (INDEPENDENT_AMBULATORY_CARE_PROVIDER_SITE_OTHER): Payer: Self-pay | Admitting: *Deleted

## 2009-10-24 ENCOUNTER — Encounter: Payer: Self-pay | Admitting: Internal Medicine

## 2009-11-01 ENCOUNTER — Telehealth (INDEPENDENT_AMBULATORY_CARE_PROVIDER_SITE_OTHER): Payer: Self-pay | Admitting: *Deleted

## 2010-01-17 ENCOUNTER — Encounter (INDEPENDENT_AMBULATORY_CARE_PROVIDER_SITE_OTHER): Payer: Self-pay | Admitting: *Deleted

## 2010-01-20 ENCOUNTER — Ambulatory Visit: Payer: Self-pay | Admitting: Internal Medicine

## 2010-01-31 ENCOUNTER — Telehealth (INDEPENDENT_AMBULATORY_CARE_PROVIDER_SITE_OTHER): Payer: Self-pay | Admitting: *Deleted

## 2010-02-01 ENCOUNTER — Ambulatory Visit: Payer: Self-pay | Admitting: Internal Medicine

## 2010-03-24 ENCOUNTER — Encounter
Admission: RE | Admit: 2010-03-24 | Discharge: 2010-03-24 | Payer: Self-pay | Source: Home / Self Care | Attending: Oncology | Admitting: Oncology

## 2010-04-30 ENCOUNTER — Encounter: Payer: Self-pay | Admitting: Oncology

## 2010-05-01 ENCOUNTER — Encounter: Payer: Self-pay | Admitting: Oncology

## 2010-05-03 ENCOUNTER — Emergency Department (HOSPITAL_COMMUNITY)
Admission: EM | Admit: 2010-05-03 | Discharge: 2010-05-03 | Disposition: A | Discharge status: Other Acute Inpatient Hospital | Payer: Self-pay | Source: Home / Self Care | Admitting: Emergency Medicine

## 2010-05-03 ENCOUNTER — Inpatient Hospital Stay (HOSPITAL_COMMUNITY)
Admission: AD | Admit: 2010-05-03 | Discharge: 2010-05-10 | DRG: 220 | Disposition: A | Discharge status: Home / Self Care | Payer: Medicare Other | Attending: Thoracic Surgery (Cardiothoracic Vascular Surgery) | Admitting: Thoracic Surgery (Cardiothoracic Vascular Surgery)

## 2010-05-03 DIAGNOSIS — Z7982 Long term (current) use of aspirin: Secondary | ICD-10-CM

## 2010-05-03 DIAGNOSIS — I4891 Unspecified atrial fibrillation: Secondary | ICD-10-CM | POA: Diagnosis present

## 2010-05-03 DIAGNOSIS — K219 Gastro-esophageal reflux disease without esophagitis: Secondary | ICD-10-CM | POA: Diagnosis present

## 2010-05-03 DIAGNOSIS — D62 Acute posthemorrhagic anemia: Secondary | ICD-10-CM | POA: Diagnosis not present

## 2010-05-03 DIAGNOSIS — Z23 Encounter for immunization: Secondary | ICD-10-CM

## 2010-05-03 DIAGNOSIS — I2510 Atherosclerotic heart disease of native coronary artery without angina pectoris: Secondary | ICD-10-CM | POA: Diagnosis present

## 2010-05-03 DIAGNOSIS — C50919 Malignant neoplasm of unspecified site of unspecified female breast: Secondary | ICD-10-CM | POA: Diagnosis present

## 2010-05-03 DIAGNOSIS — I7103 Dissection of thoracoabdominal aorta: Principal | ICD-10-CM | POA: Diagnosis present

## 2010-05-03 DIAGNOSIS — Z8679 Personal history of other diseases of the circulatory system: Secondary | ICD-10-CM

## 2010-05-03 DIAGNOSIS — E039 Hypothyroidism, unspecified: Secondary | ICD-10-CM | POA: Diagnosis present

## 2010-05-03 DIAGNOSIS — I10 Essential (primary) hypertension: Secondary | ICD-10-CM | POA: Diagnosis present

## 2010-05-03 HISTORY — DX: Personal history of other diseases of the circulatory system: Z86.79

## 2010-05-03 HISTORY — PX: OTHER SURGICAL HISTORY: SHX169

## 2010-05-03 HISTORY — PX: CORONARY ARTERY BYPASS GRAFT: SHX141

## 2010-05-03 LAB — POCT I-STAT 4, (NA,K, GLUC, HGB,HCT)
Glucose, Bld: 97 mg/dL (ref 70–99)
HCT: 30 % — ABNORMAL LOW (ref 36.0–46.0)
HCT: 23 % — ABNORMAL LOW (ref 36.0–46.0)
HCT: 27 % — ABNORMAL LOW (ref 36.0–46.0)
HCT: 19 % — ABNORMAL LOW (ref 36.0–46.0)
HCT: 33 % — ABNORMAL LOW (ref 36.0–46.0)
Hemoglobin: 11.2 g/dL — ABNORMAL LOW (ref 12.0–15.0)
Hemoglobin: 10.2 g/dL — ABNORMAL LOW (ref 12.0–15.0)
Hemoglobin: 6.5 g/dL — CL (ref 12.0–15.0)
Hemoglobin: 11.2 g/dL — ABNORMAL LOW (ref 12.0–15.0)
Potassium: 4.1 mEq/L (ref 3.5–5.1)
Potassium: 3.4 mEq/L — ABNORMAL LOW (ref 3.5–5.1)
Sodium: 140 mEq/L (ref 135–145)
Sodium: 139 mEq/L (ref 135–145)
Sodium: 141 mEq/L (ref 135–145)

## 2010-05-03 LAB — HEPATIC FUNCTION PANEL
ALT: 54 U/L — ABNORMAL HIGH (ref 0–35)
Albumin: 3.7 g/dL (ref 3.5–5.2)
Alkaline Phosphatase: 80 U/L (ref 39–117)
Total Bilirubin: 1.4 mg/dL — ABNORMAL HIGH (ref 0.3–1.2)
Total Protein: 7.9 g/dL (ref 6.0–8.3)

## 2010-05-03 LAB — HEMOGLOBIN AND HEMATOCRIT, BLOOD: Hemoglobin: 9.5 g/dL — ABNORMAL LOW (ref 12.0–15.0)

## 2010-05-03 LAB — POCT I-STAT 3, ART BLOOD GAS (G3+)
Acid-base deficit: 3 mmol/L — ABNORMAL HIGH (ref 0.0–2.0)
Bicarbonate: 28 mEq/L — ABNORMAL HIGH (ref 20.0–24.0)
Bicarbonate: 24.9 mEq/L — ABNORMAL HIGH (ref 20.0–24.0)
O2 Saturation: 99 %
O2 Saturation: 95 %
TCO2: 29 mmol/L (ref 0–100)
TCO2: 23 mmol/L (ref 0–100)
TCO2: 26 mmol/L (ref 0–100)
pCO2 arterial: 28.4 mmHg — ABNORMAL LOW (ref 35.0–45.0)
pCO2 arterial: 41 mmHg (ref 35.0–45.0)
pCO2 arterial: 43.9 mmHg (ref 35.0–45.0)
pH, Arterial: 7.548 — ABNORMAL HIGH (ref 7.350–7.400)
pH, Arterial: 7.352 (ref 7.350–7.400)

## 2010-05-03 LAB — DIFFERENTIAL
Basophils Relative: 0 % (ref 0–1)
Eosinophils Absolute: 0.1 10*3/uL (ref 0.0–0.7)
Monocytes Absolute: 0.9 10*3/uL (ref 0.1–1.0)
Monocytes Relative: 9 % (ref 3–12)

## 2010-05-03 LAB — URINALYSIS, ROUTINE W REFLEX MICROSCOPIC
Hgb urine dipstick: NEGATIVE
Nitrite: NEGATIVE
Protein, ur: NEGATIVE mg/dL
Urine Glucose, Fasting: NEGATIVE mg/dL

## 2010-05-03 LAB — PROTIME-INR: Prothrombin Time: 17.5 seconds — ABNORMAL HIGH (ref 11.6–15.2)

## 2010-05-03 LAB — BASIC METABOLIC PANEL
CO2: 24 mEq/L (ref 19–32)
Chloride: 106 mEq/L (ref 96–112)
Creatinine, Ser: 1.1 mg/dL (ref 0.4–1.2)
GFR calc Af Amer: >60 mL/min (ref >60)

## 2010-05-03 LAB — CBC
HCT: 33.1 % — ABNORMAL LOW (ref 36.0–46.0)
Hemoglobin: 13 g/dL (ref 12.0–15.0)
Hemoglobin: 11.6 g/dL — ABNORMAL LOW (ref 12.0–15.0)
MCH: 33.7 pg (ref 26.0–34.0)
MCH: 32.5 pg (ref 26.0–34.0)
MCHC: 35.5 g/dL (ref 30.0–36.0)
MCHC: 35 g/dL (ref 30.0–36.0)
Platelets: 174 10*3/uL (ref 150–400)
RBC: 3.57 MIL/uL — ABNORMAL LOW (ref 3.87–5.11)

## 2010-05-03 LAB — POCT CARDIAC MARKERS

## 2010-05-03 LAB — GLUCOSE, POCT (MANUAL RESULT ENTRY)
Glucose, Bld: 125 mg/dL — ABNORMAL HIGH (ref 70–99)
Operator id: 3390

## 2010-05-03 LAB — POCT I-STAT 3, VENOUS BLOOD GAS (G3P V)
pCO2, Ven: 30.4 mmHg — ABNORMAL LOW (ref 45.0–50.0)
pO2, Ven: 49 mmHg — ABNORMAL HIGH (ref 30.0–45.0)

## 2010-05-04 LAB — CBC
HCT: 31.1 % — ABNORMAL LOW (ref 36.0–46.0)
Hemoglobin: 11 g/dL — ABNORMAL LOW (ref 12.0–15.0)
MCH: 32.4 pg (ref 26.0–34.0)
MCH: 32.5 pg (ref 26.0–34.0)
MCHC: 35.4 g/dL (ref 30.0–36.0)
MCHC: 34.6 g/dL (ref 30.0–36.0)
MCV: 91.7 fL (ref 78.0–100.0)
Platelets: 100 10*3/uL — ABNORMAL LOW (ref 150–400)
RDW: 14.9 % (ref 11.5–15.5)

## 2010-05-04 LAB — POCT I-STAT 3, ART BLOOD GAS (G3+)
Acid-base deficit: 1 mmol/L (ref 0.0–2.0)
Bicarbonate: 24.2 mEq/L — ABNORMAL HIGH (ref 20.0–24.0)
Bicarbonate: 25.1 mEq/L — ABNORMAL HIGH (ref 20.0–24.0)
O2 Saturation: 91 %
O2 Saturation: 91 %
O2 Saturation: 92 %
TCO2: 25 mmol/L (ref 0–100)
TCO2: 26 mmol/L (ref 0–100)
TCO2: 25 mmol/L (ref 0–100)
pCO2 arterial: 38.6 mmHg (ref 35.0–45.0)
pCO2 arterial: 40.1 mmHg (ref 35.0–45.0)
pCO2 arterial: 39.5 mmHg (ref 35.0–45.0)
pO2, Arterial: 58 mmHg — ABNORMAL LOW (ref 80.0–100.0)
pO2, Arterial: 61 mmHg — ABNORMAL LOW (ref 80.0–100.0)
pO2, Arterial: 64 mmHg — ABNORMAL LOW (ref 80.0–100.0)

## 2010-05-04 LAB — CREATININE, SERUM
Creatinine, Ser: 0.85 mg/dL (ref 0.4–1.2)
GFR calc Af Amer: >60 mL/min (ref >60)

## 2010-05-04 LAB — PREPARE PLATELETS

## 2010-05-04 LAB — GLUCOSE, CAPILLARY
Glucose-Capillary: 151 mg/dL — ABNORMAL HIGH (ref 70–99)
Glucose-Capillary: 152 mg/dL — ABNORMAL HIGH (ref 70–99)
Glucose-Capillary: 132 mg/dL — ABNORMAL HIGH (ref 70–99)
Glucose-Capillary: 121 mg/dL — ABNORMAL HIGH (ref 70–99)
Glucose-Capillary: 111 mg/dL — ABNORMAL HIGH (ref 70–99)

## 2010-05-04 LAB — POCT I-STAT, CHEM 8
BUN: 16 mg/dL (ref 6–23)
Calcium, Ion: 1.02 mmol/L — ABNORMAL LOW (ref 1.12–1.32)
Chloride: 108 mEq/L (ref 96–112)

## 2010-05-04 LAB — PREPARE FRESH FROZEN PLASMA: Unit division: 0

## 2010-05-04 LAB — BASIC METABOLIC PANEL
BUN: 13 mg/dL (ref 6–23)
CO2: 24 mEq/L (ref 19–32)
Calcium: 8.1 mg/dL — ABNORMAL LOW (ref 8.4–10.5)
Glucose, Bld: 130 mg/dL — ABNORMAL HIGH (ref 70–99)
Sodium: 142 mEq/L (ref 135–145)

## 2010-05-04 NOTE — H&P (Addendum)
NAMECHARMA, MOCARSKI              ACCOUNT NO.:  [id number]  MEDICAL RECORD NO.:  [id number]          PATIENT TYPE:  INP  LOCATION:  2307                         FACILITY:  [facility]  PHYSICIAN:  Salvatore Decent. Dorris Fetch, M.D.DATE OF BIRTH:  1946/02/09  DATE OF ADMISSION:  05/03/2010 DATE OF DISCHARGE:                             HISTORY & PHYSICAL   Breanna Hernandez is a 65 year old woman with a history of hypertension who presented to the emergency room at [facility] with complaint of acute chest pain.  She had a sudden onset of pain this morning about 10:00 a.m. she was driving, was right sided, she has to pulled over, and took an aspirin.  EMS came.  She became nauseated but that resolved but continued to have pain in her chest.  She was taken to the emergency room.  Noted to be hypertensive with blood pressure of 147/50.  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  PAST MEDICAL HISTORY:  Significant for: 1. Hypertension. 2. Hypothyroidism. 3. Breast cancer x3. 4. Previous cholecystectomy.  MEDICATIONS AT HOME:  Amlodipine, hydrochlorothiazide, nadolol, and Synthroid.  She has no known drug allergies.  SOCIAL HISTORY:  She is a nonsmoker, nondrinker.  FAMILY HISTORY:  No family history of dissection.  REVIEW OF SYSTEMS:  The patient was feeling well up until acute onset of pain this morning.  No recent change in bowel or bladder habits.  No stroke or TIA symptoms.  No congestive heart failure or anginal-type symptoms.  All other systems are negative.  PHYSICAL EXAMINATION:  GENERAL:  Ms. Schlauch is a 65 year old woman in no acute distress.  She is overweight, but otherwise, without abnormality. VITAL SIGNS:  Blood pressure is 140/68, pulse is 76, respirations are 20, and oxygen saturation is 99% on 2 liters nasal cannula. NEUROLOGICAL:  She is alert and oriented x3 with no focal deficits. HEENT:   Unremarkable. NECK:  Supple without thyromegaly, adenopathy, or bruits. CARDIAC:  Regular rate and rhythm.  Normal S1 and S2.  There is a 2/6 murmur. ABDOMEN:  Soft, nontender. EXTREMITIES:  Without clubbing, cyanosis, or edema.  She has faintly palpable pulses in all 4 extremities.  LABORATORY DATA:  CT scans reviewed, reveals a type 1 dissection.  Sodium 137, potassium 4.2, BUN 23, creatinine 1.1, glucose is 113. White count 9.3, hematocrit 37, platelets 174.  Albumin 3.7.  LFTs within normal limits.  Myoglobin is 134.  CK less than 1, troponin less than 0.05.  UA was negative.  EKG shows junctional rhythm, nonspecific T-wave abnormality.  IMPRESSION:  Breanna Hernandez is a 65 year old woman with a history of hypertension who presents with acute onset of chest pain.  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  She needs urgent surgical intervention.  I discussed in detail with her the natural history of aortic dissection as well as indications, risks, benefits, and alternative treatments.  She understands the need for open surgery, cardiopulmonary bypass, circulatory arrest, and placement of the ascending aorta, possible repair or replacement of the aortic valve.  She understands the risks include but not limited to death, stroke, myocardial infarction, deep venous thrombosis, pulmonary embolism, bleeding, possible need for transfusions, infections, as well as other organ system dysfunctions including respiratory, renal, hepatic, or GI complications.  She understands and accepts these risks and agrees to proceed.  She is being prepared for the operating room as we speak and we will begin this as soon as possible.     Salvatore Decent Dorris Fetch, M.D.     SCH/MEDQ  D:  05/03/2010  T:  05/04/2010  Job:  [id number]  cc:   Trudi Ida. Denton Lank, M.D. Thereasa Solo. Little, M.D.  Electronically Signed by Breanna Hernandez M.D. on 05/04/2010 11:32:02 AM

## 2010-05-04 NOTE — Op Note (Addendum)
[name], DETTMER              ACCOUNT NO.:  [id number]  MEDICAL RECORD NO.:  [id number]          PATIENT TYPE:  INP  LOCATION:  2307                         FACILITY:  [facility]  PHYSICIAN:  Salvatore Decent. Dorris Fetch, M.D.DATE OF BIRTH:  07/28/1945  DATE OF PROCEDURE:  05/03/2010 DATE OF DISCHARGE:                              OPERATIVE REPORT   PREOPERATIVE DIAGNOSIS:  Type 1 aortic dissection.  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  PROCEDURES:  Median sternotomy, extracorporeal circulation, repair of type 1 aortic dissection with tube graft from sinotubular junction to take-off of the innominate artery, distal anastomoses under deep hypothermic circulatory arrest, coronary artery bypass grafting x1 (saphenous vein graft to right coronary), endoscopic vein harvest right thigh.  SURGEON:  Salvatore Decent. Dorris Fetch, MD  ASSISTANT:  Stephanie Acre Dasovich, PA  ANESTHESIA:  General.  FINDINGS:  Prebypass echocardiography revealed type 1 dissection.  No aortic insufficiency.  No pericardial effusion.  Normal left ventricular function.  Type 1 dissection with tear 1 cm distal to sinotubular junction in line with midportion of the noncoronary cusp, thrombosis of the false lumen.  Normal-appearing aortic valve and tricuspid.  CLINICAL NOTE:  Breanna Hernandez is a 65 year old woman with a history of hypertension, who presented with a sudden onset of chest pain at approximately 6 a.m. this morning.  She went to the emergency room at [facility].  She ruled out for myocardial infarction with initial enzymes and EKG.  However, CT angio revealed a type 1 aortic dissection. The patient was transferred to [facility].  She was brought directly to the preoperative holding area where she was seen and assessed.  She was advised to undergo surgical repair of the type 1 dissection. Indications, risks, benefits, and alternatives were discussed in  detail with the patient and her family.  They understood the high-risk nature of the procedure.  She accepted the risks and agreed to proceed.  OPERATIVE NOTE:  Breanna Hernandez was brought to the preoperative holding area after giving informed consent.  Anesthesia placed.  A Swan-Ganz catheter and arterial blood pressure monitoring line.  Intravenous access was established, intravenous antibiotics were administered.  The patient was taken to the operating room, anesthetized, and intubated.  A Foley catheter was placed.  Transesophageal echocardiography was performed, it revealed normal left ventricular function.  There was no significant valvular pathology.  There was no aortic insufficiency.  There was a type 1 aortic dissection with no visible flow in the false lumen.  The chest, abdomen, and legs were prepped and draped in usual fashion. After performing the surgical time-out, an incision was made in the right deltopectoral groove.  Of note, the patient was morbidly obese. The axillary artery was dissected out.  Care was taken not to retract or otherwise impinge on the brachial plexus.  Proximal and distal control was obtained on the axillary artery.  5000 units of heparin was administered.  The artery was clamped proximally and distally, an arteriotomy was made, an 8-mm Hemashield graft was sewn end-to-side to the axillary artery with a running 5-0 Prolene suture.  This was then  cannulated with a 24-French arterial cannula and connected to the bypass circuit.  A median sternotomy was performed.  Hemostasis was achieved. Remainder of full heparin dose was given.  The pericardium was opened, was immediately apparent.  The aorta was markedly enlarged, purplish in appearance, consistent with aortic dissection.  A dual-stage venous cannula was placed via pursestring suture in the rectal appendage. After confirming adequate anticoagulation with ACT measurement, cardiopulmonary bypass was  commenced.  Flows were maintained per protocol and immediate cooling was begun.  Left ventricular vent was placed via pursestring suture in the right superior pulmonary vein. Retrograde cardioplegic cannula was placed in the right atrium and directed into the coronary sinus.  The aorta was crossclamped.  The left ventricle was emptied via the aortic root vent.  Cardiac arrest then was achieved with a combination of cold retrograde blood cardioplegia and topical iced saline.  Total of 1 L of cardioplegia was administered. There was a rapid diastolic arrest.  There was myocardial septal cooling to 7 degrees Celsius.  The aorta was opened in the mid ascending aorta. Inspection towards the aortic valve revealed the aortic valve to be a tricuspid valve with normal-appearing leaflets.  There was a tear a centimeter distal to the sinotubular junction in line with the midpoint of the noncoronary sinus and leaflet.  The dissected aorta was resected back to the sinotubular junction.  At this point, there was a calcified plaque anteriorly and the dissection did not extend in that location. This, however, did affect an impinge on the ostium of the right coronary which was markedly narrowed.  The left main coronary was normal in appearance and the dissection did not affect the left sinus of Valsalva. The dissection had extended into the noncoronary sinus of Valsalva.  The aortic wall was reconstructed by placing Teflon felt between the intima and adventitia in this area.  BioGlue was used to fuse the tissue and Teflon felt together.  It was apparent that this calcified plaque would not be suitable to sew to as it extended to the ostium and involved the ostium of the right coronary, it was elected to plan to bypass the right coronary after preparing the proximal aorta at the level of sinotubular junction.  By this time, the patient reached 18 degrees Celsius. Barbiturates and steroids were administered.   The patient was placed in steep Trendelenburg position.  The head was packed in ice.  Deep hypothermic circulatory arrest then was performed.  After initially going with complete arrest, a clamp was placed on the innominate artery and antegrade cerebral perfusion was performed via the axillary artery maintaining flows between 500 mL and a liter per minute.  Aorta then was divided up to the takeoff of the innominate artery.  At this point, the false lumen did extend into the aortic arch, but careful inspection of the arch and proximal descending aorta revealed no intimal tears.  Aorta was reconstructed in this area again by placing Teflon felt in between the intima and adventitia and applying BioGlue to achieve a good seal. The takeoff of the brachiocephalic itself was intact with no dissection in that area.  The aorta sized for a 30-mm Hemashield graft.  The distal anastomosis was performed.  A Teflon felt strip was placed around the distal aorta.  Anastomosis then was performed with a running 4-0 Prolene suture.  At the completion of the anastomosis, flow was reinitiated. The clamp was removed from the innominate artery and a crossclamp was placed on the  aortic graft.  The anastomosis was inspected for hemostasis.  Additional sutures were placed as needed.  The proximal suture line then was done in a similar fashion again.  A separate segment of the 30-mm Hemashield graft was anastomosed end-to-side again using a Teflon felt strip to buttress the proximal suture line. Rewarming was begun after re-establishment of flow.  After completing the proximal suture line, the two halves of the graft were then cut to length with a slight bevel.  They were then anastomosed end-to-end with a running 4-0 Prolene suture.  Additional cardioplegia was administered at 20-30-minute intervals throughout this portion of the procedure to maintain myocardial septal temperature of less than 10 degrees  Celsius. There was good diastolic arrest throughout the procedure.  While this end-to-end anastomosis was performed, a segment of saphenous vein was harvested from the right thigh endoscopically. It was a good-quality vein, it was beveled at its distal end and was anastomosed end-to-side to the distal right coronary which was a 2-mm good-quality target.  This anastomosis was performed with a running 7-0 Prolene suture.  A thermal cautery was used create an aortotomy in the ascending graft and the proximal anastomosis was performed with a running 6-0 Prolene suture. Before tying the suture, extensive de-airing was performed of the heart and aortic root.  Warm dose of retrograde cardioplegia was administered. After completely de-airing the graft, the aortic crossclamp was removed. Total crossclamp time was 120 minutes.  Of note, circulatory arrest time was 27 minutes.  All proximal and distal anastomoses were inspected for hemostasis.  The patient resumed a spontaneous rhythm, but was bradycardic.  Epicardial pacing wires were placed on the right ventricle and right atrium.  DDD pacing was initiated at 90 beats per minute.  A dopamine infusion was initiated at 5 mcg/kg per minute.  When the patient had rewarmed to 37 degrees Celsius, she was weaned from cardiopulmonary bypass on the first attempt.  Total bypass time was 159 minutes.  Postbypass transesophageal echocardiography revealed good function of the aortic valve with no aortic insufficiency.  There was preserved left ventricular function.  Initial cardiac index was greater than 2 L per minute per meter squared and the patient remained hemodynamically stable throughout the postbypass period.  A test dose of protamine was administered and was well tolerated.  The atrial cannula was removed.  The patient was thrombocytopenic and coagulopathic.  FFP and platelets were administered.  The Hemashield graft on the axillary artery was  clamped near the anastomosis, divided, and oversewn with a 5-0 Prolene suture.  This wound was then packed after ensuring hemostasis.  Hemostasis was achieved in the chest.  Chest was copiously irrigated warm saline.  Two mediastinal chest tubes were placed through separate subcostal incisions.  The pericardium was reapproximated with interrupted 3-0 silk sutures, came together easily without tension and did not have any hemodynamic consequences.  Sternum was closed with interrupted heavy gauge stainless steel wires.  The pectoralis fascia, subcutaneous tissue, and skin were closed in standard fashion.  The axillary incision then was closed in 2 layers with a running 2-0 Vicryl subcutaneous suture and a running 3-0 Vicryl subcuticular suture.  All sponge, needle, and instrument counts were correct at the end of the procedure.  The patient was taken from the operating room to the surgical intensive care unit in fair condition.     Salvatore Decent Dorris Fetch, M.D.     SCH/MEDQ  D:  05/03/2010  T:  05/04/2010  Job:  [id number]  cc:   Thereasa Solo. Little, M.D.  Electronically Signed by [name] M.D. on 05/04/2010 11:32:20 AM

## 2010-05-05 LAB — CBC
HCT: 26.8 % — ABNORMAL LOW (ref 36.0–46.0)
Hemoglobin: 9.1 g/dL — ABNORMAL LOW (ref 12.0–15.0)
MCH: 32.2 pg (ref 26.0–34.0)
MCV: 94.7 fL (ref 78.0–100.0)
Platelets: 87 10*3/uL — ABNORMAL LOW (ref 150–400)
RBC: 2.83 MIL/uL — ABNORMAL LOW (ref 3.87–5.11)
WBC: 21 10*3/uL — ABNORMAL HIGH (ref 4.0–10.5)

## 2010-05-05 LAB — BASIC METABOLIC PANEL
BUN: 21 mg/dL (ref 6–23)
CO2: 27 mEq/L (ref 19–32)
Chloride: 104 mEq/L (ref 96–112)
Glucose, Bld: 157 mg/dL — ABNORMAL HIGH (ref 70–99)
Potassium: 3.9 mEq/L (ref 3.5–5.1)

## 2010-05-05 LAB — GLUCOSE, CAPILLARY
Glucose-Capillary: 140 mg/dL — ABNORMAL HIGH (ref 70–99)
Glucose-Capillary: 140 mg/dL — ABNORMAL HIGH (ref 70–99)
Glucose-Capillary: 126 mg/dL — ABNORMAL HIGH (ref 70–99)

## 2010-05-06 LAB — BASIC METABOLIC PANEL
BUN: 15 mg/dL (ref 6–23)
Chloride: 103 mEq/L (ref 96–112)
GFR calc Af Amer: >60 mL/min (ref >60)
Potassium: 3.7 mEq/L (ref 3.5–5.1)

## 2010-05-06 LAB — GLUCOSE, CAPILLARY
Glucose-Capillary: 117 mg/dL — ABNORMAL HIGH (ref 70–99)
Glucose-Capillary: 110 mg/dL — ABNORMAL HIGH (ref 70–99)

## 2010-05-06 LAB — CBC
Hemoglobin: 8.8 g/dL — ABNORMAL LOW (ref 12.0–15.0)
MCV: 95.9 fL (ref 78.0–100.0)
Platelets: 88 10*3/uL — ABNORMAL LOW (ref 150–400)
RBC: 2.71 MIL/uL — ABNORMAL LOW (ref 3.87–5.11)
WBC: 15 10*3/uL — ABNORMAL HIGH (ref 4.0–10.5)

## 2010-05-06 LAB — HEMOGLOBIN A1C: Mean Plasma Glucose: 123 mg/dL — ABNORMAL HIGH (ref <117)

## 2010-05-07 LAB — CROSSMATCH
Unit division: 0
Unit division: 0

## 2010-05-07 LAB — BASIC METABOLIC PANEL
BUN: 8 mg/dL (ref 6–23)
CO2: 27 mEq/L (ref 19–32)
Calcium: 8.3 mg/dL — ABNORMAL LOW (ref 8.4–10.5)
Chloride: 100 mEq/L (ref 96–112)
Creatinine, Ser: 0.61 mg/dL (ref 0.4–1.2)
GFR calc Af Amer: >60 mL/min (ref >60)
GFR calc non Af Amer: >60 mL/min (ref >60)
Glucose, Bld: 98 mg/dL (ref 70–99)
Potassium: 3.8 mEq/L (ref 3.5–5.1)
Sodium: 136 mEq/L (ref 135–145)

## 2010-05-07 LAB — CBC
HCT: 26.1 % — ABNORMAL LOW (ref 36.0–46.0)
Hemoglobin: 9 g/dL — ABNORMAL LOW (ref 12.0–15.0)
MCH: 32.7 pg (ref 26.0–34.0)
MCHC: 34.5 g/dL (ref 30.0–36.0)
MCV: 94.9 fL (ref 78.0–100.0)
Platelets: 125 10*3/uL — ABNORMAL LOW (ref 150–400)
RBC: 2.75 MIL/uL — ABNORMAL LOW (ref 3.87–5.11)
RDW: 14 % (ref 11.5–15.5)
WBC: 10.2 10*3/uL (ref 4.0–10.5)

## 2010-05-08 LAB — BASIC METABOLIC PANEL
BUN: 9 mg/dL (ref 6–23)
CO2: 29 mEq/L (ref 19–32)
Calcium: 8.5 mg/dL (ref 8.4–10.5)
Chloride: 101 mEq/L (ref 96–112)
Creatinine, Ser: 0.62 mg/dL (ref 0.4–1.2)
GFR calc Af Amer: >60 mL/min (ref >60)
GFR calc non Af Amer: >60 mL/min (ref >60)
Glucose, Bld: 95 mg/dL (ref 70–99)
Potassium: 3.2 mEq/L — ABNORMAL LOW (ref 3.5–5.1)
Sodium: 139 mEq/L (ref 135–145)

## 2010-05-08 LAB — CBC
HCT: 27.3 % — ABNORMAL LOW (ref 36.0–46.0)
Hemoglobin: 9.2 g/dL — ABNORMAL LOW (ref 12.0–15.0)
MCH: 32.1 pg (ref 26.0–34.0)
MCHC: 33.7 g/dL (ref 30.0–36.0)
MCV: 95.1 fL (ref 78.0–100.0)
Platelets: 171 10*3/uL (ref 150–400)
RBC: 2.87 MIL/uL — ABNORMAL LOW (ref 3.87–5.11)
RDW: 14 % (ref 11.5–15.5)
WBC: 10.8 10*3/uL — ABNORMAL HIGH (ref 4.0–10.5)

## 2010-05-08 LAB — HEPATIC FUNCTION PANEL
ALT: 315 U/L — ABNORMAL HIGH (ref 0–35)
AST: 202 U/L — ABNORMAL HIGH (ref 0–37)
Albumin: 2.8 g/dL — ABNORMAL LOW (ref 3.5–5.2)
Alkaline Phosphatase: 91 U/L (ref 39–117)
Bilirubin, Direct: 0.2 mg/dL (ref 0.0–0.3)
Indirect Bilirubin: 0.8 mg/dL (ref 0.3–0.9)
Total Bilirubin: 1 mg/dL (ref 0.3–1.2)
Total Protein: 5.3 g/dL — ABNORMAL LOW (ref 6.0–8.3)

## 2010-05-08 LAB — AMYLASE: Amylase: 41 U/L (ref 0–105)

## 2010-05-09 LAB — BASIC METABOLIC PANEL
BUN: 8 mg/dL (ref 6–23)
Calcium: 8.8 mg/dL (ref 8.4–10.5)
Chloride: 99 mEq/L (ref 96–112)
Creatinine, Ser: 0.79 mg/dL (ref 0.4–1.2)
GFR calc non Af Amer: >60 mL/min (ref >60)

## 2010-05-09 LAB — CBC
MCH: 32.6 pg (ref 26.0–34.0)
MCHC: 34.4 g/dL (ref 30.0–36.0)
MCV: 94.8 fL (ref 78.0–100.0)
Platelets: 229 10*3/uL (ref 150–400)
RDW: 14 % (ref 11.5–15.5)

## 2010-05-09 NOTE — Letter (Signed)
[facility]   Imported By: Lanelle Bal 09/23/2009 09:47:22  _____________________________________________________________________  External Attachment:    Type:   Image     Comment:   External Document

## 2010-05-09 NOTE — Progress Notes (Signed)
Summary: prep ?'s   Phone Note Call from Patient Call back at [phone]   Caller: Patient Call For: Dr. Juanda Chance Reason for Call: Talk to Nurse Summary of Call: prep ?'s Initial call taken by: Vallarie Mare,  January 31, 2010 10:29 AM  Follow-up for Phone Call        pt called to confirm names of prep med.  Reviewed meds with patient. Follow-up by: Ezra Sites RN,  January 31, 2010 10:39 AM

## 2010-05-09 NOTE — Progress Notes (Signed)
Summary: Refill Request  Phone Note Refill Request Call back at [phone] Message from:  Pharmacy on November 01, 2009 8:28 AM  Refills Requested: Medication #1:  LORAZEPAM 1 MG TABS 1 by mouth once daily as needed   Dosage confirmed as above?Dosage Confirmed   Supply Requested: 1 month   Last Refilled: 04/14/2009 [facility] [address] [place]  Initial call taken by: Lavell Islam,  November 01, 2009 8:30 AM    Prescriptions: LORAZEPAM 1 MG TABS (LORAZEPAM) 1 by mouth once daily as needed  #30 x 0   Entered by:   Shonna Chock CMA   Authorized by:   Marga Melnick MD   Signed by:   Shonna Chock CMA on 11/01/2009   Method used:   Printed then faxed to ...       [facility]  [address] [phone]* (retail)       [address]       [place], [place]  [address]       Ph: [phone] or [phone]       Fax: [phone]   RxID:   [id number]

## 2010-05-09 NOTE — Letter (Signed)
Summary: [facility] Instructions  [facility]  [address] [place], [place] [address]   Phone: [phone]  Fax: [phone]       Breanna Hernandez    Jan 23, 1946    MRN: [id number]       Procedure Day Dorna Bloom:  Wednesday 02/01/2010     Arrival Time: 8:00 am     Procedure Time: 9:00 am     Location of Procedure:                    _ x_  [facility] (4th Floor)      PREPARATION FOR COLONOSCOPY WITH MIRALAX  Starting 5 days prior to your procedure Friday 10/21 do not eat nuts, seeds, popcorn, corn, beans, peas,  salads, or any raw vegetables.  Do not take any fiber supplements (e.g. Metamucil, Citrucel, and Benefiber). ____________________________________________________________________________________________________   THE DAY BEFORE YOUR PROCEDURE         DATE: Tuesday 10/25  1   Drink clear liquids the entire day-NO SOLID FOOD  2   Do not drink anything colored red or purple.  Avoid juices with pulp.  No orange juice.  3   Drink at least 64 oz. (8 glasses) of fluid/clear liquids during the day to prevent dehydration and help the prep work efficiently.  CLEAR LIQUIDS INCLUDE: Water Jello Ice Popsicles Tea (sugar ok, no milk/cream) Powdered fruit flavored drinks Coffee (sugar ok, no milk/cream) Gatorade Juice: apple, white grape, white cranberry  Lemonade Clear bullion, consomm, broth Carbonated beverages (any kind) Strained chicken noodle soup Hard Candy  4   Mix the entire bottle of Miralax with 64 oz. of Gatorade/Powerade in the morning and put in the refrigerator to chill.  5   At 3:00 pm take 2 Dulcolax/Bisacodyl tablets.  6   At 4:30 pm take one Reglan/Metoclopramide tablet.  7  Starting at 5:00 pm drink one 8 oz glass of the Miralax mixture every 15-20 minutes until you have finished drinking the entire 64 oz.  You should finish drinking prep around 7:30 or 8:00 pm.  8   If you are nauseated, you may take the 2nd  Reglan/Metoclopramide tablet at 6:30 pm.        9    At 8:00 pm take 2 more DULCOLAX/Bisacodyl tablets.     THE DAY OF YOUR PROCEDURE      DATE:  Wednesday 10/26  You may drink clear liquids until 7:00 am  (2 HOURS BEFORE PROCEDURE).   MEDICATION INSTRUCTIONS  Unless otherwise instructed, you should take regular prescription medications with a small sip of water as early as possible the morning of your procedure.   Additional medication instructions:  Hold HCTZ morning of procedure only         OTHER INSTRUCTIONS  You will need a responsible adult at least 67 years of age to accompany you and drive you home.   This person must remain in the waiting room during your procedure.  Wear loose fitting clothing that is easily removed.  Leave jewelry and other valuables at home.  However, you may wish to bring a book to read or an iPod/MP3 player to listen to music as you wait for your procedure to start.  Remove all body piercing jewelry and leave at home.  Total time from sign-in until discharge is approximately 2-3 hours.  You should go home directly after your procedure and rest.  You can resume normal activities the day after your procedure.  The day of your procedure you should not:   Drive   Make legal decisions   Operate machinery   Drink alcohol   Return to work  You will receive specific instructions about eating, activities and medications before you leave.   The above instructions have been reviewed and explained to me by  Sherren Kerns RN  January 20, 2010 10:56 AM   I fully understand and can verbalize these instructions _____________________________ Date _______

## 2010-05-09 NOTE — Letter (Signed)
Summary: Colonoscopy Letter  [facility]  [address] [place], [place] [address]   Phone: [phone]  Fax: [phone]      October 12, 2009 MRN: [id number]   [facility] [address] [place], [place]  [address]   Dear Ms. Delaguila,   According to your medical record, it is time for you to schedule a Colonoscopy. The American Cancer Society recommends this procedure as a method to detect early colon cancer. Patients with a family history of colon cancer, or a personal history of colon polyps or inflammatory bowel disease are at increased risk.  This letter has beeen generated based on the recommendations made at the time of your procedure. If you feel that in your particular situation this may no longer apply, please contact our office.  Please call our office at [phone] to schedule this appointment or to update your records at your earliest convenience.  Thank you for cooperating with [place] to provide you with the very best care possible.   Sincerely,  Hedwig Morton. Juanda Chance, M.D.  [facility] [phone]

## 2010-05-09 NOTE — Procedures (Signed)
Summary: Colonoscopy  Patient: Breanna Hernandez Note: All result statuses are Final unless otherwise noted.  Tests: (1) Colonoscopy (COL)   COL Colonoscopy           DONE     [facility]     [address]     [place], [place]  [address]           COLONOSCOPY PROCEDURE REPORT           PATIENT:  Breanna Hernandez, Breanna Hernandez  MR#:  [id number]     BIRTHDATE:  11/16/1945, 63 yrs. old  GENDER:  female     ENDOSCOPIST:  Hedwig Morton. Juanda Chance, MD     REF. BY:     PROCEDURE DATE:  02/01/2010     PROCEDURE:  Colonoscopy [address]     ASA CLASS:  Class II     INDICATIONS:  colon polyp 1999     last colon 2004     MEDICATIONS:   Versed 10 mg, Fentanyl 100 mcg           DESCRIPTION OF PROCEDURE:   After the risks benefits and     alternatives of the procedure were thoroughly explained, informed     consent was obtained.  Digital rectal exam was performed and     revealed no rectal masses.   The LB [id number] endoscope     was introduced through the anus and advanced to the cecum, which     was identified by both the appendix and ileocecal valve, without     limitations.  The quality of the prep was good, using MiraLax.     The instrument was then slowly withdrawn as the colon was fully     examined.     <<PROCEDUREIMAGES>>           FINDINGS:  Mild diverticulosis was found (see image1).  This was     otherwise a normal examination of the colon (see image6, image4,     image3, and image2).   Retroflexed views in the rectum revealed no     abnormalities.    The scope was then withdrawn from the patient     and the procedure completed.           COMPLICATIONS:  None     ENDOSCOPIC IMPRESSION:     1) Mild diverticulosis     2) Otherwise normal examination     RECOMMENDATIONS:     1) high fiber diet     REPEAT EXAM:  In 10 year(s) for.           ______________________________     Hedwig Morton. Juanda Chance, MD           CC:  Pecola Lawless, MD           n.     Rosalie DoctorHedwig Morton. Keshana Klemz at  02/01/2010 10:02 AM           Ladson, Zinc, [phone]  Note: An exclamation mark (!) indicates a result that was not dispersed into the flowsheet. Document Creation Date: 02/01/2010 10:02 AM _______________________________________________________________________  (1) Order result status: Final Collection or observation date-time: 02/01/2010 09:53 Requested date-time:  Receipt date-time:  Reported date-time:  Referring Physician:   Ordering Physician: Lina Sar [phone]) Specimen Source:  Source: Launa Grill Order Number: [id number] Lab site:   Appended Document: Colonoscopy    Clinical Lists Changes  Observations: Added new observation of COLONNXTDUE: 01/2020 (02/01/2010 12:40)

## 2010-05-09 NOTE — Letter (Signed)
Summary: [facility]  [facility]   Imported By: Lanelle Bal 06/21/2009 14:17:33  _____________________________________________________________________  External Attachment:    Type:   Image     Comment:   External Document

## 2010-05-09 NOTE — Letter (Signed)
Summary: Previsit letter  [facility]  [address] [place], [place] [address]   Phone: [phone]  Fax: [phone]       10/19/2009 MRN: [id number]  [facility] [address] [place], [place]  [address]  Dear Breanna Hernandez,  Welcome to the Gastroenterology Division at [facility].    You are scheduled to see a nurse for your pre-procedure visit on 11-25-09 at 10am on the 3rd floor at [facility], [address]  We ask that you try to arrive at our office 15 minutes prior to your appointment time to allow for check-in.  Your nurse visit will consist of discussing your medical and surgical history, your immediate family medical history, and your medications.    Please bring a complete list of all your medications or, if you prefer, bring the medication bottles and we will list them.  We will need to be aware of both prescribed and over the counter drugs.  We will need to know exact dosage information as well.  If you are on blood thinners (Coumadin, Plavix, Aggrenox, Ticlid, etc.) please call our office today/prior to your appointment, as we need to consult with your physician about holding your medication.   Please be prepared to read and sign documents such as consent forms, a financial agreement, and acknowledgement forms.  If necessary, and with your consent, a friend or relative is welcome to sit-in on the nurse visit with you.  Please bring your insurance card so that we may make a copy of it.  If your insurance requires a referral to see a specialist, please bring your referral form from your primary care physician.  No co-pay is required for this nurse visit.     If you cannot keep your appointment, please call [phone] to cancel or reschedule prior to your appointment date.  This allows [place] the opportunity to schedule an appointment for another patient in need of care.    Thank you for choosing [facility] for your  medical needs.  We appreciate the opportunity to care for you.  Please visit [place] at our website  to learn more about our practice.                     Sincerely.                                                                                                                   The Gastroenterology Division

## 2010-05-09 NOTE — Letter (Signed)
[facility]: [facility]   Imported By: Lanelle Bal 05/16/2009 09:30:33  _____________________________________________________________________  External Attachment:    Type:   Image     Comment:   External Document

## 2010-05-09 NOTE — Letter (Signed)
Summary: Previsit letter  [facility]  [address] [place], [place] [address]   Phone: [phone]  Fax: [phone]       10/24/2009 MRN: [id number]  [facility] [address] [place], [place]  [address]  Dear Ms. Hnat,  Welcome to the Gastroenterology Division at [facility].    You are scheduled to see a nurse for your pre-procedure visit on 12-02-09 at 4pm on the 3rd floor at [facility], [address].  We ask that you try to arrive at our office 15 minutes prior to your appointment time to allow for check-in.  Your nurse visit will consist of discussing your medical and surgical history, your immediate family medical history, and your medications.    Please bring a complete list of all your medications or, if you prefer, bring the medication bottles and we will list them.  We will need to be aware of both prescribed and over the counter drugs.  We will need to know exact dosage information as well.  If you are on blood thinners (Coumadin, Plavix, Aggrenox, Ticlid, etc.) please call our office today/prior to your appointment, as we need to consult with your physician about holding your medication.   Please be prepared to read and sign documents such as consent forms, a financial agreement, and acknowledgement forms.  If necessary, and with your consent, a friend or relative is welcome to sit-in on the nurse visit with you.  Please bring your insurance card so that we may make a copy of it.  If your insurance requires a referral to see a specialist, please bring your referral form from your primary care physician.  No co-pay is required for this nurse visit.     If you cannot keep your appointment, please call [phone] to cancel or reschedule prior to your appointment date.  This allows [place] the opportunity to schedule an appointment for another patient in need of care.    Thank you for choosing [facility] for your medical  needs.  We appreciate the opportunity to care for you.  Please visit [place] at our website  to learn more about our practice.                     Sincerely.                                                                                                                   The Gastroenterology Division

## 2010-05-09 NOTE — Miscellaneous (Signed)
Summary: previsit prep/rm  Clinical Lists Changes  Medications: Added new medication of MIRALAX   POWD (POLYETHYLENE GLYCOL 3350) As per prep  instructions. - Signed Added new medication of DULCOLAX 5 MG  TBEC (BISACODYL) Day before procedure take 2 at 3pm and 2 at 8pm. - Signed Added new medication of REGLAN 10 MG  TABS (METOCLOPRAMIDE HCL) As per prep instructions. - Signed Rx of MIRALAX   POWD (POLYETHYLENE GLYCOL 3350) As per prep  instructions.;  #255gm x 0;  Signed;  Entered by: Sherren Kerns RN;  Authorized by: Hart Carwin MD;  Method used: Electronically to [facility]  [address] [phone]*, [address] [place], [place], [place]  [address], Ph: [phone] or [phone], Fax: [phone] Rx of DULCOLAX 5 MG  TBEC (BISACODYL) Day before procedure take 2 at 3pm and 2 at 8pm.;  #4 x 0;  Signed;  Entered by: Sherren Kerns RN;  Authorized by: Hart Carwin MD;  Method used: Electronically to [facility]  [address] [phone]*, [address] [place], [place], [place]  [address], Ph: [phone] or [phone], Fax: [phone] Rx of REGLAN 10 MG  TABS (METOCLOPRAMIDE HCL) As per prep instructions.;  #2 x 0;  Signed;  Entered by: Sherren Kerns RN;  Authorized by: Hart Carwin MD;  Method used: Electronically to [facility]  [address] [phone]*, [address] [place], [place], [place]  [address], Ph: [phone] or [phone], Fax: [phone] Observations: Added new observation of ALLERGY REV: Done (01/20/2010 10:33)    Prescriptions: REGLAN 10 MG  TABS (METOCLOPRAMIDE HCL) As per prep instructions.  #2 x 0   Entered by:   Sherren Kerns RN   Authorized by:   Hart Carwin MD   Signed by:   Sherren Kerns RN on 01/20/2010   Method used:   Electronically to        [facility]  [address] [phone]* (retail)       [address]       [place], [place]  [address]       Ph: [phone] or [phone]       Fax: [phone]   RxID:   [id number] DULCOLAX 5 MG  TBEC (BISACODYL) Day before procedure take 2 at 3pm and  2 at 8pm.  #4 x 0   Entered by:   Sherren Kerns RN   Authorized by:   Hart Carwin MD   Signed by:   Sherren Kerns RN on 01/20/2010   Method used:   [facility]ronically to        [facility]  [address] [phone]* (retail)       [address]       [place], [place]  [address]       Ph: [phone] or [phone]       Fax: [phone]   RxID:   [id number] MIRALAX   POWD (POLYETHYLENE GLYCOL 3350) As per prep  instructions.  #255gm x 0   Entered by:   Sherren Kerns RN   Authorized by:   Hart Carwin MD   Signed by:   Sherren Kerns RN on 01/20/2010   Method used:   [facility]ronically to        [facility]  [address] [phone]* (retail)       [address] [facility]  [place], [place]  [address]       Ph: [phone] or [phone]       Fax: [phone]   RxID:   [id number]

## 2010-05-09 NOTE — Assessment & Plan Note (Signed)
Summary: med refill/kdc   Vital Signs:  Patient profile:   65 year old female Height:      64 inches Weight:      229.4 pounds BMI:     39.52 Temp:     98.3 degrees F oral Pulse rate:   64 / minute Resp:     16 per minute BP sitting:   130 / 82  (left arm) Cuff size:   large  Vitals Entered By: Shonna Chock (May 27, 2009 1:20 PM) CC: Yearly follow-up, heart follow-up will be completed by Dr.Little, Lipid Management, Hypertension Management Comments REVIEWED MED LIST, PATIENT AGREED DOSE AND INSTRUCTION CORRECT    CC:  Yearly follow-up, heart follow-up will be completed by Dr.Little, Lipid Management, and Hypertension Management.  History of Present Illness: Breanna Hernandez is here for med refill; she believed  Dr Caprice Kluver monitors  lipids but none on record since lipid panel here in 2007. Last Card OV was 6/09 to evaluate palpitations. Dr Patsy Lager 03/2009 OV reviewed ; he felt breast disease is inactive , monitor every 6 mos. 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 Bev has decreased sweets since 03/2009; CVE as walking with grandchildren.  Hypertension History:      She complains of neurologic problems, but denies headache, chest pain, palpitations, dyspnea with exertion, orthopnea, PND, peripheral edema, visual symptoms, syncope, and side effects from treatment.  She notes no problems with any antihypertensive medication side effects.  Further comments include: BP 120/75.        Positive major cardiovascular risk factors include female age 67 years old or older, hyperlipidemia, hypertension, and family history for ischemic heart disease (males less than 24 years old).  Negative major cardiovascular risk factors include no history of diabetes and non-tobacco-user status.        Further assessment for target organ damage reveals no history of ASHD, stroke/TIA, or peripheral vascular disease.    Lipid Management History:      Positive NCEP/ATP III risk factors  include female age 67 years old or older, HDL cholesterol less than 40, family history for ischemic heart disease (males less than 42 years old), and hypertension.  Negative NCEP/ATP III risk factors include non-diabetic, non-tobacco-user status, no ASHD (atherosclerotic heart disease), no prior stroke/TIA, no peripheral vascular disease, and no history of aortic aneurysm.      Allergies (verified): No Known Drug Allergies  Past History:  Past Medical History: Hypertension & palpitations, Dr Clarene Duke Breast cancer, hx of 2000,2005 on R; 2007 on L, Dr Cyndie Chime Peripheral neuropathy Colonic polyps, hx of, 2005 GERD Hyperlipidemia  Past Surgical History: Lumpectomy X2 on R;1X on L; radiation  2000; site implant 2007 Cholecystectomy Tonsillectomy Colon polypectomy 2005, due 2011, Dr Juanda Chance  Family History: Father: d 26 cns aneurysm, MI X15, 1st MI in 42s Mother: [place]'s,HTN,goiter Siblings: bro HTN  Social History: Married Never Smoked Alcohol use-no  Review of Systems General:  Denies fatigue and sleep disorder; Weight down 14#with decreased bread & sweets. Eyes:  Denies blurring, double vision, and vision loss-both eyes. ENT:  Denies difficulty swallowing and hoarseness. CV:  Denies leg cramps with exertion, lightheadness, and near fainting. Resp:  Complains of morning headaches; denies excessive snoring and hypersomnolence. GI:  Denies abdominal pain, bloody stools, dark tarry stools, and indigestion; Colonoscopy 2011. Derm:  Complains of dryness; denies changes in nail beds, hair loss, and lesion(s). Neuro:  Complains of numbness and tingling; N&T in feet due to neuropathy  due to ?Marland Kitchen Psych:  Denies anxiety and depression. Endo:  Denies cold intolerance, excessive hunger, excessive thirst, excessive urination, and heat intolerance.  Physical Exam  General:  well-nourished; alert,appropriate and cooperative throughout examination Eyes:  No corneal or conjunctival  inflammation noted. EOMI. Perrla. No lid lag Neck:  No deformities, masses, or tenderness noted. Lungs:  Normal respiratory effort, chest expands symmetrically. Lungs are clear to auscultation, no crackles or wheezes. Heart:  normal rate, regular rhythm, no gallop, no rub, no JVD, no HJR, and grade 1 /6 systolic murmur.   Abdomen:  Bowel sounds positive,abdomen soft and non-tender without masses, organomegaly or hernias noted. Pulses:  R and L carotid,radial,dorsalis pedis and posterior tibial pulses are full and equal bilaterally Extremities:  No clubbing, cyanosis, edema, or deformity noted  Neurologic:  alert & oriented X3 and DTRs symmetrical and normal except @ R knee  . No tremor Skin:  Intact without suspicious lesions or rashes Cervical Nodes:  No lymphadenopathy noted Axillary Nodes:  No palpable lymphadenopathy Psych:  memory intact for recent and remote, normally interactive, and good eye contact.     Impression & Recommendations:  Problem # 1:  HYPERLIPIDEMIA (ICD-272.4)  Metabolic Syndrome Pattern ( "Pre Diabetes")  Orders: TLB-A1C / Hgb A1C (Glycohemoglobin) (83036-A1C)  Problem # 2:  UNSPECIFIED HYPOTHYROIDISM (ICD-244.9)  Orders: Venipuncture (78469) TLB-TSH (Thyroid Stimulating Hormone) (62952-WUX) Prescription Created Electronically [phone])  Problem # 3:  HYPERTENSION (ICD-401.9)  Controlled Her updated medication list for this problem includes:    Nadolol 20 Mg Tabs (Nadolol)    Hydrochlorothiazide 12.5 Mg Caps (Hydrochlorothiazide) .Marland Kitchen... 1 by mouth once daily    Amlodipine Besy-benazepril Hcl 5-20 Mg Caps (Amlodipine besy-benazepril hcl) .Marland Kitchen... 1 by mouth once daily  Orders: Venipuncture (10272) TLB-Creatinine, Blood (82565-CREA) TLB-Potassium (K+) (84132-K) TLB-BUN (Urea Nitrogen) (84520-BUN)  Problem # 4:  METABOLIC SYNDROME X (ICD-277.7)  Orders: Venipuncture (53664) TLB-A1C / Hgb A1C (Glycohemoglobin) (83036-A1C)  Problem # 5:  NONSPEC  ELEVATION OF LEVELS OF TRANSAMINASE/LDH (ICD-790.4) NASH, PMH of   Complete Medication List: 1)  Nadolol 20 Mg Tabs (Nadolol) 2)  Levothyroxine Sodium 50 Mcg  .Marland KitchenMarland Kitchen. 1 by mouth once daily 3)  Hydrochlorothiazide 12.5 Mg Caps (Hydrochlorothiazide) .Marland Kitchen.. 1 by mouth once daily 4)  Lorazepam 1 Mg Tabs (Lorazepam) .Marland Kitchen.. 1 by mouth once daily as needed 5)  Amlodipine Besy-benazepril Hcl 5-20 Mg Caps (Amlodipine besy-benazepril hcl) .Marland Kitchen.. 1 by mouth once daily  Hypertension Assessment/Plan:      The patient's hypertensive risk group is category B: At least one risk factor (excluding diabetes) with no target organ damage.  Today's blood pressure is 130/82.    Lipid Assessment/Plan:      Based on NCEP/ATP III, the patient's risk factor category is "2 or more risk factors and a calculated 10 year CAD risk of > 20%".  The patient's lipid goals are as follows: Total cholesterol goal is 200; LDL cholesterol goal is 130; HDL cholesterol goal is 40; Triglyceride goal is 150.  Her LDL cholesterol goal has not been met.  Secondary causes for hyperlipidemia have been ruled out.  She has been counseled on adjunctive measures for lowering her cholesterol and has been provided with dietary instructions.    Patient Instructions: 1)  Eat brown , low glycemic carbs. Consume < 25 grams of sugar / day from foods & drinks withh High Fructose Corn Syrup as #1,2 or #3 on label. 2)  Please schedule a follow-up appointment in 3 months. 3)  Hepatic Panel prior to  visit, ICD-9:790.4 4)  NMR Lipoprofile Lipid Panel prior to visit, ICD-9:272.4 Prescriptions: LEVOTHYROXINE SODIUM 50 MCG 1 by mouth once daily Brand medically necessary #90 x 3   Entered and Authorized by:   Marga Melnick MD   Signed by:   Marga Melnick MD on 05/27/2009   Method used:   [facility] to ...       [facility]  [address] [phone]* (retail)       [address]       [place], [place]  [address]       Ph: [phone] or [phone]       Fax:  [phone]   RxID:   [id number]

## 2010-05-10 LAB — HEPATIC FUNCTION PANEL
ALT: 143 U/L — ABNORMAL HIGH (ref 0–35)
Indirect Bilirubin: 0.9 mg/dL (ref 0.3–0.9)
Total Protein: 6 g/dL (ref 6.0–8.3)

## 2010-05-10 LAB — BASIC METABOLIC PANEL
BUN: 10 mg/dL (ref 6–23)
Creatinine, Ser: 0.91 mg/dL (ref 0.4–1.2)
GFR calc non Af Amer: >60 mL/min (ref >60)
Potassium: 3.9 mEq/L (ref 3.5–5.1)

## 2010-05-10 LAB — CBC
MCV: 96.3 fL (ref 78.0–100.0)
Platelets: 257 10*3/uL (ref 150–400)
RDW: 14.4 % (ref 11.5–15.5)
WBC: 11.8 10*3/uL — ABNORMAL HIGH (ref 4.0–10.5)

## 2010-05-11 ENCOUNTER — Emergency Department (HOSPITAL_COMMUNITY): Payer: Medicare Other

## 2010-05-11 ENCOUNTER — Inpatient Hospital Stay (HOSPITAL_COMMUNITY)
Admission: EM | Admit: 2010-05-11 | Discharge: 2010-05-17 | DRG: 310 | Disposition: A | Discharge status: Home / Self Care | Payer: Medicare Other | Attending: Cardiology | Admitting: Cardiology

## 2010-05-11 DIAGNOSIS — I10 Essential (primary) hypertension: Secondary | ICD-10-CM | POA: Diagnosis present

## 2010-05-11 DIAGNOSIS — F4321 Adjustment disorder with depressed mood: Secondary | ICD-10-CM | POA: Diagnosis present

## 2010-05-11 DIAGNOSIS — E876 Hypokalemia: Secondary | ICD-10-CM | POA: Diagnosis not present

## 2010-05-11 DIAGNOSIS — E039 Hypothyroidism, unspecified: Secondary | ICD-10-CM | POA: Diagnosis present

## 2010-05-11 DIAGNOSIS — I509 Heart failure, unspecified: Secondary | ICD-10-CM | POA: Diagnosis present

## 2010-05-11 DIAGNOSIS — F411 Generalized anxiety disorder: Secondary | ICD-10-CM | POA: Diagnosis present

## 2010-05-11 DIAGNOSIS — Z951 Presence of aortocoronary bypass graft: Secondary | ICD-10-CM

## 2010-05-11 DIAGNOSIS — Z853 Personal history of malignant neoplasm of breast: Secondary | ICD-10-CM

## 2010-05-11 DIAGNOSIS — R7989 Other specified abnormal findings of blood chemistry: Secondary | ICD-10-CM | POA: Diagnosis present

## 2010-05-11 DIAGNOSIS — I4891 Unspecified atrial fibrillation: Principal | ICD-10-CM | POA: Diagnosis present

## 2010-05-11 DIAGNOSIS — T462X5A Adverse effect of other antidysrhythmic drugs, initial encounter: Secondary | ICD-10-CM | POA: Diagnosis present

## 2010-05-11 DIAGNOSIS — Z7901 Long term (current) use of anticoagulants: Secondary | ICD-10-CM

## 2010-05-11 LAB — URINALYSIS, ROUTINE W REFLEX MICROSCOPIC
Hgb urine dipstick: NEGATIVE
Leukocytes, UA: NEGATIVE
Specific Gravity, Urine: 1.012 (ref 1.005–1.030)
Urobilinogen, UA: 0.2 mg/dL (ref 0.0–1.0)

## 2010-05-11 LAB — POCT CARDIAC MARKERS
CKMB, poc: 2.6 ng/mL (ref 1.0–8.0)
Myoglobin, poc: 145 ng/mL (ref 12–200)
Troponin i, poc: <0.05 ng/mL (ref 0.00–0.09)

## 2010-05-11 LAB — URINE MICROSCOPIC-ADD ON

## 2010-05-11 LAB — POCT I-STAT, CHEM 8
BUN: 14 mg/dL (ref 6–23)
Chloride: 106 mEq/L (ref 96–112)
Sodium: 138 mEq/L (ref 135–145)

## 2010-05-11 LAB — CBC
Platelets: 241 10*3/uL (ref 150–400)
RDW: 14.7 % (ref 11.5–15.5)
WBC: 12.9 10*3/uL — ABNORMAL HIGH (ref 4.0–10.5)

## 2010-05-11 LAB — COMPREHENSIVE METABOLIC PANEL
ALT: 96 U/L — ABNORMAL HIGH (ref 0–35)
AST: 35 U/L (ref 0–37)
Alkaline Phosphatase: 92 U/L (ref 39–117)
CO2: 24 mEq/L (ref 19–32)
GFR calc non Af Amer: >60 mL/min (ref >60)
Glucose, Bld: 96 mg/dL (ref 70–99)
Potassium: 3.6 mEq/L (ref 3.5–5.1)
Sodium: 138 mEq/L (ref 135–145)
Total Protein: 5.9 g/dL — ABNORMAL LOW (ref 6.0–8.3)

## 2010-05-11 LAB — MAGNESIUM: Magnesium: 1.8 mg/dL (ref 1.5–2.5)

## 2010-05-11 LAB — APTT: aPTT: 25 seconds (ref 24–37)

## 2010-05-11 LAB — HEMOGLOBIN A1C: Hgb A1c MFr Bld: 5.7 % — ABNORMAL HIGH (ref <5.7)

## 2010-05-11 LAB — PROTIME-INR: INR: 1.1 (ref 0.00–1.49)

## 2010-05-12 LAB — COMPREHENSIVE METABOLIC PANEL
Albumin: 3 g/dL — ABNORMAL LOW (ref 3.5–5.2)
Alkaline Phosphatase: 89 U/L (ref 39–117)
BUN: 7 mg/dL (ref 6–23)
CO2: 24 mEq/L (ref 19–32)
Chloride: 103 mEq/L (ref 96–112)
Creatinine, Ser: 0.66 mg/dL (ref 0.4–1.2)
GFR calc non Af Amer: >60 mL/min (ref >60)
Glucose, Bld: 105 mg/dL — ABNORMAL HIGH (ref 70–99)
Potassium: 3.4 mEq/L — ABNORMAL LOW (ref 3.5–5.1)
Total Bilirubin: 1.4 mg/dL — ABNORMAL HIGH (ref 0.3–1.2)

## 2010-05-12 LAB — PROTIME-INR: INR: 1.07 (ref 0.00–1.49)

## 2010-05-12 LAB — GLUCOSE, CAPILLARY: Glucose-Capillary: 138 mg/dL — ABNORMAL HIGH (ref 70–99)

## 2010-05-13 LAB — PROTIME-INR
INR: 1.21 (ref 0.00–1.49)
Prothrombin Time: 15.5 seconds — ABNORMAL HIGH (ref 11.6–15.2)

## 2010-05-14 LAB — CBC
HCT: 29.2 % — ABNORMAL LOW (ref 36.0–46.0)
MCH: 32.1 pg (ref 26.0–34.0)
MCV: 96.7 fL (ref 78.0–100.0)
Platelets: 321 10*3/uL (ref 150–400)
RDW: 15.1 % (ref 11.5–15.5)

## 2010-05-14 LAB — BASIC METABOLIC PANEL
BUN: 6 mg/dL (ref 6–23)
Creatinine, Ser: 0.71 mg/dL (ref 0.4–1.2)
GFR calc non Af Amer: >60 mL/min (ref >60)
Glucose, Bld: 110 mg/dL — ABNORMAL HIGH (ref 70–99)
Potassium: 3.6 mEq/L (ref 3.5–5.1)

## 2010-05-15 LAB — CBC
HCT: 29.8 % — ABNORMAL LOW (ref 36.0–46.0)
MCHC: 33.6 g/dL (ref 30.0–36.0)
MCV: 95.8 fL (ref 78.0–100.0)
Platelets: 348 10*3/uL (ref 150–400)
RDW: 14.9 % (ref 11.5–15.5)
WBC: 13.7 10*3/uL — ABNORMAL HIGH (ref 4.0–10.5)

## 2010-05-15 LAB — COMPREHENSIVE METABOLIC PANEL
Albumin: 3 g/dL — ABNORMAL LOW (ref 3.5–5.2)
BUN: 5 mg/dL — ABNORMAL LOW (ref 6–23)
Calcium: 9.1 mg/dL (ref 8.4–10.5)
Glucose, Bld: 106 mg/dL — ABNORMAL HIGH (ref 70–99)
Potassium: 3.2 mEq/L — ABNORMAL LOW (ref 3.5–5.1)
Sodium: 139 mEq/L (ref 135–145)
Total Protein: 6.4 g/dL (ref 6.0–8.3)

## 2010-05-15 LAB — PROTIME-INR
INR: 2.18 — ABNORMAL HIGH (ref 0.00–1.49)
Prothrombin Time: 24.4 seconds — ABNORMAL HIGH (ref 11.6–15.2)

## 2010-05-16 LAB — BASIC METABOLIC PANEL
BUN: 7 mg/dL (ref 6–23)
CO2: 27 mEq/L (ref 19–32)
Calcium: 9 mg/dL (ref 8.4–10.5)
GFR calc Af Amer: >60 mL/min (ref >60)
GFR calc non Af Amer: >60 mL/min (ref >60)
GFR calc non Af Amer: >60 mL/min (ref >60)
Glucose, Bld: 99 mg/dL (ref 70–99)
Potassium: 3 mEq/L — ABNORMAL LOW (ref 3.5–5.1)
Potassium: 4 mEq/L (ref 3.5–5.1)
Sodium: 136 mEq/L (ref 135–145)

## 2010-05-16 LAB — CBC
Hemoglobin: 10.4 g/dL — ABNORMAL LOW (ref 12.0–15.0)
Platelets: 395 10*3/uL (ref 150–400)
RBC: 3.24 MIL/uL — ABNORMAL LOW (ref 3.87–5.11)
WBC: 13.6 10*3/uL — ABNORMAL HIGH (ref 4.0–10.5)

## 2010-05-16 LAB — MAGNESIUM: Magnesium: 2.1 mg/dL (ref 1.5–2.5)

## 2010-05-16 LAB — PROTIME-INR
INR: 2.34 — ABNORMAL HIGH (ref 0.00–1.49)
Prothrombin Time: 25.8 seconds — ABNORMAL HIGH (ref 11.6–15.2)

## 2010-05-16 LAB — T3, FREE: T3, Free: 1.9 pg/mL — ABNORMAL LOW (ref 2.3–4.2)

## 2010-05-17 ENCOUNTER — Encounter: Payer: Self-pay | Admitting: Internal Medicine

## 2010-05-17 LAB — CBC
HCT: 29.4 % — ABNORMAL LOW (ref 36.0–46.0)
Hemoglobin: 9.9 g/dL — ABNORMAL LOW (ref 12.0–15.0)
MCHC: 33.7 g/dL (ref 30.0–36.0)
MCV: 95.8 fL (ref 78.0–100.0)
Platelets: 414 10*3/uL — ABNORMAL HIGH (ref 150–400)
WBC: 11.6 10*3/uL — ABNORMAL HIGH (ref 4.0–10.5)

## 2010-05-17 LAB — BASIC METABOLIC PANEL
BUN: 8 mg/dL (ref 6–23)
Calcium: 9 mg/dL (ref 8.4–10.5)
GFR calc non Af Amer: >60 mL/min (ref >60)
Glucose, Bld: 108 mg/dL — ABNORMAL HIGH (ref 70–99)

## 2010-05-17 LAB — PROTIME-INR: Prothrombin Time: 27.6 seconds — ABNORMAL HIGH (ref 11.6–15.2)

## 2010-05-18 NOTE — Discharge Summary (Signed)
Breanna Hernandez, Breanna Hernandez              ACCOUNT NO.:  [id number]  MEDICAL RECORD NO.:  [id number]          PATIENT TYPE:  INP  LOCATION:  2018                         FACILITY:  [facility]  PHYSICIAN:  Salvatore Decent. Dorris Fetch, M.D.DATE OF BIRTH:  05-Apr-1946  DATE OF ADMISSION:  05/03/2010 DATE OF DISCHARGE:  05/10/2010                              DISCHARGE SUMMARY   ADMITTING DIAGNOSES: 1. Type 1 aortic dissection. 2. History of hypertension. 3. History of hypothyroidism. 4. History of breast cancer x3.  DISCHARGE DIAGNOSES: 1. Type 1 aortic dissection. 2. History of hypertension. 3. History of hypothyroidism. 4. History of breast cancer x3. 5. Acute blood loss anemia. 6. Elevated transaminases. 7. Atrial fibrillation (conversion to sinus rhythm).  PROCEDURES:  Emergent median sternotomy, extracorporeal circulation, and repair of type 1 aortic dissection with tube graft from sinotubular junction to take off the innominate artery, distal anastomosis under deep hypothermic circulatory arrest, coronary artery bypass graft x1 (saphenous vein graft to right coronary artery with endoscopic vessel harvesting from the right side) by Dr. Dorris Fetch on May 03, 2010.  HISTORY OF PRESENTING ILLNESS:  This is a 65 year old Caucasian female with the aforementioned past medical history who presented initially to [facility] Emergency Room with complaints of acute chest pain. According to medical records, the patient was driving around 16:10 a.m. to a saloon appointment when she experienced a sudden onset of chest pain.  She pulled her car over, took an aspirin, and EMS was summoned. She then became nauseated, however, this did resolve.  She did continue to have chest pain.  Upon her arrival to [facility], the patient was found to be hypertensive with a blood pressure of 147/50.  A CT angiogram of the chest revealed a type A dissection with possible thrombosis of the false  lumen.  The patient was then emergently transferred to [facility] for further evaluation and treatment by Dr. Dorris Fetch.  The patient obviously needed urgent surgical intervention. Potential risks, complications, and benefits were discussed.  The patient agreed to proceed, and she underwent the repair of the type 1 aortic dissection with CABG x1 on May 03, 2010.  BRIEF HOSPITAL COURSE STAY:  The patient was extubated without difficulty early the morning of postop day #1.  She remained afebrile and hemodynamically stable.  She was initially AAI paced.  She also initially required 50% Ventimask.  She was able to be weaned off this and was placed on nasal cannula and eventually weaned to room air.  Her Swan-Ganz A-line chest tubes and Foley were all removed early in her postoperative course.  She was found to have leukocytosis postoperatively.  Her white blood cell count did increase to 24,300. Her last white blood cell count was 13,300.  She was given steroids intraoperatively.  She remains afebrile.  There are no signs of wound infection.  No GU complaints.  No pneumonia seen on chest x-ray.  The patient was also found to have acute blood loss anemia.  H and H went as low as 8.8 and 26.  She did not require a postoperative transfusion. Her last H and H was 10 and 29.1  respectively.  She has already been started on a beta-blocker and Norvasc.  She did, however, go into atrial fibrillation.  She was placed on an amiodarone drip initially.  She then converted to sinus rhythm and was placed on amiodarone p.o.  However, she was found to have elevated transaminases.  Her last AST was 202 and ALT was 315.  These will again be checked prior to discharge.  Her amiodarone was discontinued, and she did remain in sinus rhythm.  She was then felt surgically stable for transfer from intensive care unit to [facility] for further convalescence on May 08, 2010.  She was found to be volume overloaded  and diuresed accordingly.  Currently on postop day #6, she has already been tolerating a diet.  She has had a bowel movement. Her complaints include anxiety, difficulty sleeping, loose stools, some neck and shoulder pain, and an episode where uptil apparently got "stuck".  PHYSICAL EXAMINATION:  VITAL SIGNS:  She is afebrile.  Heart rate is 76, BP 144/81, O2 sats 91% on room air.  Preop weight is 104 kg, weight is down to 105.6 kg. CARDIOVASCULAR:  Regular rate and rhythm. PULMONARY:  Clear. ABDOMEN:  Soft and nontender.  Bowel sounds present. EXTREMITIES:  Positive lower extremity edema bilaterally. SKIN:  Her sternal wound is clean, dry, and continuing to heal.  Provided the patient remains in sinus rhythm, remains afebrile, and pending morning round evaluation, she will be surgically stable for discharge on May 10, 2010.  Latest laboratory studies are as follows.  BMET done on May 09, 2010, potassium 3.8 which has been supplemented, sodium 138, BUN and creatinine 8 and 0.79 respectively. CBC done on this date H and H 10 and 29.1, white count 13,300, platelet count 229,000.  Last chest x-ray done on May 09, 2010, showed small bilateral pleural effusions, mild interstitial edema, improvement in overall aeration.  DISCHARGE INSTRUCTIONS:  Diet:  The patient to remain on a low-sodium heart-healthy diet. Activity:  No driving, no strenuous activity.  No lifting more than 10 pounds until instructed otherwise.  The patient is to continue with her breathing exercise daily.  She is to walk every day and increase her frequency and duration as she tolerates. Wound care:  She may shower.  She is to cleanse her wounds with mild soap and water, to call the office if any wound problems arise.  FOLLOWUP APPOINTMENTS: 1. The patient is to contact Dr. Fredirick Maudlin office for followup     appointment with his discretion. 2. The patient has an appointment with Dr. Dorris Fetch on May 31, 2010, at 11:15 a.m.; 30 minutes prior to this office appointment, a     chest x-ray will be obtained.  DISCHARGE MEDICATIONS:  At the time of this dictation include the following. 1. Enteric-coated aspirin 325 mg p.o. daily. 2. Lasix 40 mg p.o. daily x4 days. 3. Mucinex 600 mg p.o. two times daily for cough. 4. Lopressor 50 mg p.o. two times daily. 5. Potassium chloride 20 mEq p.o. daily x4 days. 6. Ultram 50 mg 1-2 tablets every 4-6 hours as needed for pain. 7. Amlodipine and benazepril 5/20 one tablet p.o. daily. 8. Calcium carbonate over the counter p.o. two times daily. 9. Ibuprofen 200 mg 3 tablets p.o. q.8 h. p.r.n. pain. 10.Omega-3 acid ethyl esters 1 g p.o. daily. 11.Synthroid 50 mcg p.o. daily.     Doree Fudge, PA   ______________________________ Salvatore Decent Dorris Fetch, M.D.    DZ/MEDQ  D:  05/09/2010  T:  05/10/2010  Job:  [id number]  cc:   Thereasa Solo. Little, M.D.  Electronically Signed by Doree Fudge PA on 05/10/2010 09:36:28 AM Electronically Signed by [name] M.D. on 05/18/2010 10:23:10 AM

## 2010-05-26 ENCOUNTER — Encounter: Payer: Self-pay | Admitting: Internal Medicine

## 2010-05-29 ENCOUNTER — Encounter: Payer: Self-pay | Admitting: Internal Medicine

## 2010-05-30 ENCOUNTER — Other Ambulatory Visit: Payer: Self-pay | Admitting: Thoracic Surgery (Cardiothoracic Vascular Surgery)

## 2010-05-30 DIAGNOSIS — I7101 Dissection of thoracic aorta: Secondary | ICD-10-CM

## 2010-05-31 ENCOUNTER — Encounter (INDEPENDENT_AMBULATORY_CARE_PROVIDER_SITE_OTHER): Payer: Self-pay | Admitting: Thoracic Surgery (Cardiothoracic Vascular Surgery)

## 2010-05-31 ENCOUNTER — Ambulatory Visit
Admission: RE | Admit: 2010-05-31 | Discharge: 2010-05-31 | Disposition: A | Discharge status: Home / Self Care | Payer: Medicare Other | Source: Ambulatory Visit | Attending: Thoracic Surgery (Cardiothoracic Vascular Surgery) | Admitting: Thoracic Surgery (Cardiothoracic Vascular Surgery)

## 2010-05-31 DIAGNOSIS — I7101 Dissection of thoracic aorta: Secondary | ICD-10-CM

## 2010-06-01 NOTE — H&P (Signed)
Breanna Hernandez, Breanna Hernandez              ACCOUNT NO.:  [id number]  MEDICAL RECORD NO.:  [id number]           PATIENT TYPE:  E  LOCATION:  MAJO                         FACILITY:  [facility]  PHYSICIAN:  Thereasa Solo. Harlow Basley, M.D. DATE OF BIRTH:  02-24-1946  DATE OF ADMISSION:  05/11/2010 DATE OF DISCHARGE:                             HISTORY & PHYSICAL   CHIEF COMPLAINT:  Chest discomfort and tachycardia.  HISTORY OF PRESENT ILLNESS:  The patient is a 65 year old female who was just discharged 12 hours ago after emergency surgery for aortic dissection.  The patient had her surgery on May 03, 2010.  According to the discharge summary she did have some postoperative atrial fibrillation and was put on amiodarone, but this was discontinued because of LFTs of 202 and 315.  She was in sinus rhythm.  She was discharged in sinus rhythm and her LFTs were actually improving to 58 and 54.  Unfortunately, her records from her admission are not available to me as she was just recently discharged.  The patient said she went home and was doing well, but woke up at 2:00 a.m. with tachycardia and weak feeling.  She went to her blood pressure machine and her heart rate was 200.  She came to the emergency room and was seen by the emergency room physician and put on diltiazem.  Her rate came down, but her blood pressure also dropped into the 90s.  She eventually converted spontaneously to sinus rhythm.  Her blood pressure is now 105/50.  She is much more comfortable.  She denies any fever or chills.  PAST MEDICAL HISTORY:  Remarkable for breast cancer.  She has had radiation therapy on the right and lumpectomy on the right in 2005.  She had radiation on the left in 2007.  She has had a cholecystectomy.  She has been treated by Dr. Clarene Duke for hypertension.  DISCHARGE MEDICATIONS: 1. Aspirin 325 mg a day. 2. Lasix 40 mg a day. 3. Lopressor 50 mg b.i.d. 4. Potassium 20 mEq a day. 5. Norvasc 5 mg a day. 6.  Benazepril 10 mg a day. 7. Synthroid 0.05 mg a day.  ALLERGIES:  SHE HAS NO KNOWN DRUG ALLERGIES.  SOCIAL HISTORY:  She is married.  She is a nonsmoker.  FAMILY HISTORY:  Unremarkable.  REVIEW OF SYSTEMS:  Essentially unremarkable, except for noted above.  PHYSICAL EXAMINATION:  VITAL SIGNS:  Blood pressure currently is 105/50, heart rate 85, respirations 12. GENERAL:  She is a morbidly obese female in no acute distress. HEENT:  Normocephalic, atraumatic.  Extraocular movements are intact. Sclera is nonicteric.  Lids and conjunctivae are within normal limits. NECK:  Without JVD or bruit.  Thyroid not enlarged. CHEST: Clear to auscultation and percussion.  She does have a healing anterior surgical scar. ABDOMEN:  Morbidly obese, nontender. EXTREMITIES:  Reveal no edema.  Distal pulses are 3+/4 bilaterally. NEURO:  Grossly intact.  She is awake, alert and oriented, cooperative. Moves all extremities without obvious deficit. SKIN: Cool and dry.  LABORATORY:  White count 12.9, hemoglobin 12.1, hematocrit 34.4, platelets 241.  Sodium 138, potassium 3.7, BUN 14, creatinine 0.8,  troponin is negative.  EKG shows sinus rhythm with no acute changes.  IMPRESSION: 1. Recurrent paroxysmal atrial fibrillation. 2. Status post emergency surgery May 03, 2010 for type I aortic     dissection. 3. Hypertension. 4. History of breast cancer. 5. Morbid obesity. 6. History of elevated liver function tests during her previous     admission, which resulted in stopping her amiodarone prior to     discharge.  PLAN:  The patient will be admitted to telemetry.  We will continue her beta blocker and add low dose Cardizem.  She may need to go back on amiodarone, this will be discussed further with Dr. Clarene Duke.  It is interesting to note that the patient actually did have some atrial fibrillation a week before she had her aortic dissection.  She was instructed to take an extra metoprolol and it went  away spontaneously. At this time, we will not start heparin or Lovenox, but will probably need to consider this if she has recurrent atrial fibrillation.  She will probably need to go on heparin.     Abelino Derrick, P.A.   ______________________________ Thereasa Solo. Rudie Rikard, M.D.    Lenard Lance  D:  05/11/2010  T:  05/11/2010  Job:  [id number]  Electronically Signed by Corine Shelter P.A. on 05/16/2010 12:26:53 PM Electronically Signed by Julieanne Manson M.D. on 06/01/2010 01:03:59 PM

## 2010-06-01 NOTE — Discharge Summary (Signed)
Breanna Hernandez, Breanna Hernandez              ACCOUNT NO.:  [id number]  MEDICAL RECORD NO.:  [id number]           Hernandez TYPE:  I  LOCATION:  3705                         FACILITY:  [facility]  PHYSICIAN:  Thereasa Solo. Sandra Tellefsen, M.D. DATE OF BIRTH:  [birth year]  DATE OF ADMISSION:  05/11/2010 DATE OF DISCHARGE:  05/17/2010                              DISCHARGE SUMMARY   DISCHARGE DIAGNOSES: 1. Atrial fibrillation with rapid ventricular response, now     maintaining sinus rhythm. 2. Significant sinus pauses both in atrial fibrillation and sinus     rhythm while on IV Cardizem.  We will monitor as an outpatient for     tachy-brady syndrome and to rule out need for permanent pacemaker. 3. Anticoagulation on Coumadin with therapeutic INRs. 4. Recent type 1 aortic dissection, undergoing repair of Breanna type 1     aortic dissection with tube graft and coronary artery bypass graft     x1 with saphenous vein graft to Breanna right coronary artery on     May 03, 2010. 5. Hypokalemia, resolving. 6. Situational depression secondary to sudden surgery and now repeat     hospitalization. 7. Hypothyroidism.  We will monitor as an outpatient now as Breanna Hernandez is on     amiodarone and on replacement therapy with Synthroid.  This may     also be playing a role in Breanna Hernandez depression. 8. Situational anxiety. 9. Hypertension, controlled. 10.History of breast cancer without recurrence. 11.Obesity. 12.Drug-induced abnormal liver function studies, possibly related to     amiodarone, will need to monitor as an outpatient.  DISCHARGE CONDITION:  Improved.  PROCEDURES:  None.  DISCHARGE MEDICATIONS:  See medication reconciliation sheet from [facility].  Breanna Hernandez will be on a decreasing dose of amiodarone.  Breanna Hernandez did not tolerate Celexa in [facility] hospital with hot flashes and diarrhea that could be interpreted to lactulose Breanna Hernandez was receiving in [facility] hospital.  Breanna Hernandez is now on Lexapro to help with Breanna Hernandez depression and Ativan.  We increased  Breanna Hernandez potassium, but that will need to be monitored.  Breanna Hernandez potassium is low as an outpatient and of course Coumadin was added.  DISCHARGE INSTRUCTIONS: 1. Increase activity slowly, follow previous discharge instructions. 2. Low-sodium, heart-healthy diet. 3. Wound care as before for chest incision. 4. Follow up with Dr. Clarene Duke next week on May 29, 2010 at noon. 5. Follow up with Dr. Dorris Fetch as previously instructed. 6. Follow up at [facility]     on May 18, 2010 at 3:10 p.m. 7. You are scheduled for a monitor to wear on May 18, 2010 at 2:30     p.m. just before [facility] [place].  HOSPITAL COURSE:  Breanna Hernandez is a 65 year old female, had just been discharged 12 hours prior to this admission after having emergency surgery for aortic dissection with repair of Breanna aorta and bypass grafting x1 with a vein graft to Breanna RCA.  Breanna Hernandez did have some postop AFib and had been placed on amiodarone, but it was discontinued because of abnormal LFTs up to 202.  Breanna Hernandez maintained sinus rhythm towards Breanna end of Breanna Hernandez  hospital stay and Breanna Hernandez LFTs had actually improved.  Breanna Hernandez awoke at 2 a.m. on May 11, 2010 with tachycardia.  Heart was beating out of Breanna Hernandez chest and weak feeling.  Breanna Hernandez took Breanna Hernandez blood pressure and heart rate on Breanna machine was 200.  Breanna Hernandez came to [facility] emergency room and put on diltiazem.  Breanna Hernandez rate came down, and Breanna Hernandez blood pressure also dropped into Breanna 90s.  Breanna Hernandez eventually converted spontaneously to sinus rhythm, but then would go back and forth into AFib in sinus rhythm.  PAST MEDICAL HISTORY:  Breast cancer with radiation on Breanna right, lumpectomy on Breanna right in 2005.  Breanna Hernandez had radiation on Breanna left in 2007. Other history includes cholecystectomy and history of hypertension.  Breanna Hernandez was admitted and throughout Breanna day after admission, Breanna Hernandez continued with episodic AFib with rapid ventricular response.  Breanna Hernandez  was then given a bolus of IV amiodarone and drip started, which did control Breanna Hernandez heart rate.  Cardizem was continued and changed to p.o., and Breanna Hernandez did develop long pauses.  While on sinus rhythm, Breanna Hernandez would have 3.5-second pauses and at times, up to 6-second pauses.  Cardizem was discontinued and Breanna Hernandez maintained sinus with PACs and then Breanna Hernandez did eventually have more atrial fibrillation.  Breanna Hernandez last atrial fib prior to discharge was on Breanna night of May 15, 2010, but no further bradycardia.  Breanna Hernandez had had an extra IV Lopressor during Breanna night.  By May 17, 2010, Dr. Lynnea Ferrier had a long discussion with Breanna Hernandez concerning possibility of pacemaker, staying in [facility] hospital, monitoring as an outpatient.  Breanna Hernandez was quite anxious to be discharged.  Breanna Hernandez had maintained sinus rhythm for 24 hours.  Breanna Hernandez amio continues currently at 400 twice a day and will be decreased on May 19, 2010 down to 200 twice a day.  Please note Breanna Hernandez potassium is slightly low today because we have discontinued Breanna Hernandez benazepril.  We will increase Breanna Hernandez potassium to 40 mEq daily.  Breanna Hernandez is somewhat anemic at 9.9 after surgery, this has been stable.  Breanna Hernandez white count was slightly elevated, but is down to 11.6. Additionally, Breanna Hernandez TSH is elevated at 8.  We will follow that as an outpatient before we make any adjustments with Breanna Hernandez recent episodes of PAF.  LABORATORY DATA:  At discharge, sodium 135, potassium 3.4 which will be replaced prior to discharge, chloride 102, CO2 of 24, glucose 108, BUN 8, creatinine 0.68, and calcium 9.0.  Pro time 27.6 with a INR of 2.56. CBC:  Hemoglobin 9.9, hematocrit 29.4, WBC 11.6, and platelets 414. Free T3 was 1.9.  TSH was 8.161.  Magnesium level was 2.1 and most recent comprehensive metabolic panel, Breanna Hernandez total bili was 1.0, alkaline phos was 100, SGOT 33, SGPT was 48, total protein was 6.4, albumin 3.0, and calcium 9.1.  Urine had 0-2 wbc's, some bacteria, nitrites were positive, leukocytes were  negative.  Hemoglobin A1c was 5.7.  Breanna Hernandez initial TSH was 7.891.  MRSA screen was negative.  BNP on admission had been 285.  Actually on amio, Breanna Hernandez LFTs had actually come down.  Breanna Hernandez SGPT on admission had been 96.  We will have lab work done to include comprehensive metabolic panel; TSH; free T4, T3; and T3, T4.  CBC to evaluate Breanna Hernandez anemia.  RADIOLOGY:  Chest x-ray on admission, mild perihilar and basilar predominant pulmonary edema consistent with heart failure.  Breanna Hernandez was maintained on Breanna Hernandez regular dose of Lasix.  Blood pressure was 101/53, pulse 73, respirations 19,  temperature 97.3, oxygen saturation on room air was 95% on discharge.  Breanna Hernandez will follow up with Dr. Clarene Duke as instructed.     Darcella Gasman. [place], N.P.   ______________________________ Thereasa Solo Trebor Galdamez, M.D.    LRI/MEDQ  D:  05/17/2010  T:  05/17/2010  Job:  [id number]  cc:   Salvatore Decent. Dorris Fetch, M.D. Dr. Alwyn Ren  Electronically Signed by Nada Boozer N.P. on 05/18/2010 03:35:37 PM Electronically Signed by Julieanne Manson M.D. on 06/01/2010 01:03:56 PM

## 2010-06-01 NOTE — Assessment & Plan Note (Signed)
OFFICE VISIT  Breanna Hernandez, Breanna Hernandez DOB:  14-Nov-1945                                        May 31, 2010 CHART #:  [id number]  REASON FOR VISIT:  Followup after recent repair type 1 aortic dissection and CABG x1.  The patient is a 65 year old woman who presented with acute onset of chest pain back in January 2012.  On May 03, 2010, she underwent repair of a type 1 aortic dissection with a replacement of the ascending aorta with deep hypothermic circulatory arrest.  We did also have to do a bypass of the right coronary artery because of the calcified plaque had extended to the ostium of the right coronary and could not be sewn through to repair the root.  There also was some ostial narrowing of the right coronary from this plaque.  Postoperatively, she really did quite well.  She was, however, readmitted with atrial fibrillation a few days after she was first discharged.  She has been out of the hospital and in sinus rhythm for about 2 weeks since then.  She saw Dr. Clarene Duke yesterday and said everything was good except her blood pressure was still little elevated at that time.  She does have some soreness, but by and large is doing well and had numerous questions.  CURRENT MEDICATIONS: 1. Aspirin 81 mg daily. 2. Lopressor 50 mg b.i.d. 3. Amlodipine/benazepril 5/20 one tablet b.i.d. 4. Synthroid 50 mcg daily. 5. Mucinex 600 mg b.i.d. 6. Ultram 50 mg q.6 hours p.r.n. pain. 7. Over-the-counter calcium carbonate twice daily. 8. Fish oil 1000 mg daily. 9. Ibuprofen 600 mg q.8 hours p.r.n. 10.Oxycodone p.r.n.  On physical examination, her blood pressure is 160/82, pulse 60, respirations 16, oxygen saturation 95% on room air.  Neurologically, she is alert and oriented x3 with no focal deficits.  Her incisions are healing well except for 1 area, likely suture granuloma at the inferior portion of her sternal incision.  This was explored.  There was  some blood but no purulence.  The sternum itself is stable.  Chest x-ray shows good aeration of the lungs bilaterally.  IMPRESSION:  The patient is now about a month out from repair of a type 1 dissection.  She is doing well at this point in time.  She did have some atrial fibrillation which required readmission but that is currently under control.  She may have had atrial fibrillation a few weeks prior to her admission.  She had an episode of palpitations but this was not formally diagnosed.  My biggest concern currently is her blood pressure still elevated.  She is on a pretty good medical regimen with a beta-blocker, calcium blocker, and ACE inhibitor.  Rather than have multiple doctors start adjusting medications, I recommended that she recontact Dr. Fredirick Maudlin office as they advised her to if her blood pressure remained elevated, just to see if they want to make an adjustment to her regimen.  I will plan to see her back in 6 months, repeat a CT angio of the chest, reassess the remainder of the aorta.  I recommended that she not try driving for a couple more of weeks, she is not to lift anything greater than 10 pounds for at least another 2 weeks.  Salvatore Decent Dorris Fetch, M.D. Electronically Signed  SCH/MEDQ  D:  05/31/2010  T:  06/01/2010  Job:  [id number] 

## 2010-06-06 NOTE — Letter (Signed)
Summary: [facility]  [facility]   Imported By: Maryln Gottron 05/31/2010 14:01:25  _____________________________________________________________________  External Attachment:    Type:   Image     Comment:   External Document

## 2010-06-07 ENCOUNTER — Telehealth (INDEPENDENT_AMBULATORY_CARE_PROVIDER_SITE_OTHER): Payer: Self-pay | Admitting: *Deleted

## 2010-06-15 NOTE — Progress Notes (Signed)
Summary: Thyroid labs  Phone Note Call from Patient   Summary of Call: Patient left message on triage that she had thyroid tested at Dr. Julieanne Manson office and was notified that it would be sent to our office because her meds may need to be adjusted. This has not been scanned in, have you seen it? Please advise.  Initial call taken by: Lucious Groves CMA,  June 07, 2010 11:51 AM  Follow-up for Phone Call        yes; thyroid was abnormal . As soon as it has been scanned ask her to see me before refill of thyroid med. give samples if she is running out  Follow-up by: Marga Melnick MD,  June 08, 2010 5:38 AM  Additional Follow-up for Phone Call Additional follow up Details #1::        spoke w/ patient aware of information and says that she is currently recovering from aortic surgey and not sure when she will be able to come in but informed that once information is scanned that we will call her with further instructions....Marland KitchenMarland KitchenDoristine Devoid CMA  June 08, 2010 9:06 AM

## 2010-06-27 NOTE — Letter (Signed)
Summary: [facility]   [facility]   Imported By: Kassie Mends 06/22/2010 08:54:19  _____________________________________________________________________  External Attachment:    Type:   Image     Comment:   External Document

## 2010-07-26 ENCOUNTER — Encounter: Payer: Self-pay | Admitting: Internal Medicine

## 2010-07-26 ENCOUNTER — Ambulatory Visit (INDEPENDENT_AMBULATORY_CARE_PROVIDER_SITE_OTHER): Payer: Medicare Other | Admitting: Internal Medicine

## 2010-07-26 VITALS — BP 128/80 | HR 56 | Wt 232.4 lb

## 2010-07-26 DIAGNOSIS — E876 Hypokalemia: Secondary | ICD-10-CM

## 2010-07-26 DIAGNOSIS — E039 Hypothyroidism, unspecified: Secondary | ICD-10-CM

## 2010-07-26 MED ORDER — LEVOTHYROXINE SODIUM 75 MCG PO TABS: 75.0000 ug | ORAL_TABLET | Freq: Every day | ORAL | Status: DC

## 2010-07-26 NOTE — Progress Notes (Signed)
  Subjective:    Patient ID: Breanna Hernandez, female    DOB: May 11, 1945, 65 y.o.   MRN: [id number]  HPI she returns to followup on her thyroid function tests. The TSH was significantly elevated at 8.161 ; free T3 was decreased @ 1.9. She was started on Amiodarone in late 04/2010 while in ICU.  Since discharge she has had difficulty sleeping; she states she awakens every 2 hours or so. She has been using lorazepam with some benefit. She is having mild fatigue ; HCT was 29.4 on 02/08. Symptoms: Fever: no  Night sweats: yes, once only  Weight loss: no   Exertional chest pain: no  Dyspnea: no  Cough: no  Hemoptysis: no  Leg swelling: yes, better with HCTZ ( Note: recent hypokalemia)  PND: no  Melena: no  Adenopathy: no  Severe snoring or apnea: no  Daytime sleepiness: no  Skin changes: no  Feeling depressed: no  Anhedonia: yes, improved with  time  Altered appetite: no  Poor sleep: yes, see above       Review of Systems     Objective:   Physical Exam she exhibits no distress; skin is warm and dry without lesions.  Extraocular motion is intact. There is no lid lag present.  Thyroid is normal to palpation.  She has a regular rhythm with a grade 1/2 systolic murmur. Second heart sound is increased.  Deep tendon reflexes are 0-1/2+ at the knees.  She has no tremor or needle lysis of the nails. She is oriented and interactive. Clinically she does not slightly depressed.          Assessment & Plan:  #1 hypothyroidism in the context of amiodarone therapy  #2 fatigue, multifactorial. Her anemia contributes to this  #3 recent hypokalemia. Significantly  HCTZ  has been restarted.  Plan: #1 her Synthroid will be increased from 50 mcg a day to 75 mcg a day. TSH will be checked in 8 weeks.  #2 Close monitor of the potassium is  necessary. As her renal function is normal, perhaps she would be a candidate for spironolactone in place of HCTZ if the hypokalemia is  recurrent.

## 2010-07-26 NOTE — Patient Instructions (Addendum)
Please  Use  the salt substitute "No Salt"  @ your table. Increase  intake of citrus products.  In 8-10 weeks repeat the  TSH with free T4 & free T3  along with a potassium level(244.9, 276.8)

## 2010-07-28 ENCOUNTER — Other Ambulatory Visit: Payer: Self-pay | Admitting: *Deleted

## 2010-07-28 MED ORDER — LORAZEPAM 1 MG PO TABS: 1.0000 mg | ORAL_TABLET | Freq: Every day | ORAL | Status: DC | PRN

## 2010-08-21 ENCOUNTER — Encounter (HOSPITAL_BASED_OUTPATIENT_CLINIC_OR_DEPARTMENT_OTHER): Payer: Medicare Other | Admitting: Oncology

## 2010-08-21 ENCOUNTER — Other Ambulatory Visit: Payer: Self-pay | Admitting: Oncology

## 2010-08-21 DIAGNOSIS — C50419 Malignant neoplasm of upper-outer quadrant of unspecified female breast: Secondary | ICD-10-CM

## 2010-08-21 LAB — COMPREHENSIVE METABOLIC PANEL
BUN: 15 mg/dL (ref 6–23)
CO2: 25 mEq/L (ref 19–32)
Creatinine, Ser: 0.76 mg/dL (ref 0.40–1.20)
Glucose, Bld: 92 mg/dL (ref 70–99)
Total Bilirubin: 0.7 mg/dL (ref 0.3–1.2)

## 2010-08-21 LAB — CBC WITH DIFFERENTIAL/PLATELET
BASO%: 0.4 % (ref 0.0–2.0)
Basophils Absolute: 0 10*3/uL (ref 0.0–0.1)
HCT: 39.3 % (ref 34.8–46.6)
LYMPH%: 26.9 % (ref 14.0–49.7)
MCHC: 34.9 g/dL (ref 31.5–36.0)
MONO#: 0.5 10*3/uL (ref 0.1–0.9)
NEUT%: 63.2 % (ref 38.4–76.8)
Platelets: 185 10*3/uL (ref 145–400)
WBC: 6.1 10*3/uL (ref 3.9–10.3)

## 2010-08-21 LAB — LACTATE DEHYDROGENASE: LDH: 155 U/L (ref 94–250)

## 2010-08-21 LAB — FERRITIN: Ferritin: 90 ng/mL (ref 10–291)

## 2010-08-25 ENCOUNTER — Other Ambulatory Visit: Payer: Self-pay | Admitting: Oncology

## 2010-08-25 ENCOUNTER — Encounter (HOSPITAL_BASED_OUTPATIENT_CLINIC_OR_DEPARTMENT_OTHER): Payer: Medicare Other | Admitting: Oncology

## 2010-08-25 DIAGNOSIS — C50419 Malignant neoplasm of upper-outer quadrant of unspecified female breast: Secondary | ICD-10-CM

## 2010-08-25 DIAGNOSIS — Z9889 Other specified postprocedural states: Secondary | ICD-10-CM

## 2010-08-25 DIAGNOSIS — Z170 Estrogen receptor positive status [ER+]: Secondary | ICD-10-CM

## 2010-08-25 DIAGNOSIS — Z853 Personal history of malignant neoplasm of breast: Secondary | ICD-10-CM

## 2010-08-25 NOTE — Op Note (Signed)
Breanna Hernandez, Breanna Hernandez              ACCOUNT NO.:  [id number]   MEDICAL RECORD NO.:  [id number]          PATIENT TYPE:  AMB   LOCATION:  DAY                          FACILITY[facility]  [facility]   PHYSICIAN:  Currie Paris, M.D.DATE OF BIRTH:  14-Sep-1945   DATE OF PROCEDURE:  02/14/2006  DATE OF DISCHARGE:                                 OPERATIVE REPORT   CCS [phone]   PREOPERATIVE DIAGNOSIS:  Carcinoma left breast, upper outer quadrant.   POSTOPERATIVE DIAGNOSIS:  Carcinoma left breast, upper outer quadrant.   OPERATION:  Placement of MammoSite for radiation therapy.   SURGEON:  Currie Paris, M.D.   ANESTHESIA:  MAC.   MEDICAL HISTORY:  [name] is a 59-year lady getting ready to have  radiation for her breast cancer.  She elected to proceed with MammoSite  radiation.   DESCRIPTION OF PROCEDURE:  The patient confirmed that we were planned to  place a MammoSite in the left breast.  The left side was initialed to  confirm that site.   The patient was taken to the operating and after satisfactory IV sedation,  the breast was prepped and draped.  The time-out occurred.   Using ultrasound I could see that I had a nice cavity that was about 2 cm  deep.  It measured about 4 cm wide and perhaps 6 to 7 cm long.   I aspirated it and got some bloody fluid and injected about the 10 mL of  0.25% Marcaine with epi.   I also injected a small area just medial in the skin so that we could place  the MammoSite through that.   Using spinal needle I accessed the cavity again and injected the tract.   We opened an elliptical MammoSite and the first one unfortunately was not  symmetric but the second appeared appropriate.   Small incision was made in the skin just medial to the lumpectomy incision.  Using trocar and ultrasound guidance, the cavity was entered.  The seroma  was decompressed.  The MammoSite was introduced and inflated to 65 mL with  additional seroma being drained as this  filled up.   Using ultrasound we seemed to have the cavity fairly well filled and good  approximation.   Sterile dressings applied.  The patient tolerated the procedure well.  No  operative complications.  All counts were correct.      Currie Paris, M.D.  Electronically Signed     CJS/MEDQ  D:  02/14/2006  T:  02/14/2006  Job:  [id number]   cc:   Billie Lade, M.D.  Fax: [phone]   Genene Churn. Cyndie Chime, M.D.  Fax: [phone]

## 2010-08-25 NOTE — Consult Note (Signed)
NAMEDAHNA, HATTABAUGH NO.:  [id number]   MEDICAL RECORD NO.:  [id number]          PATIENT TYPE:  EMS   LOCATION:  MAJO                         FACILITY:  [facility]   PHYSICIAN:  Melvyn Novas, M.D.  DATE OF BIRTH:  07-Jul-1945   DATE OF CONSULTATION:  06/05/2004  DATE OF DISCHARGE:                                   CONSULTATION   REQUESTING PHYSICIAN:  Consult called through the ER physician, Dr. Lorre Nick.   The patient was seen on June 05, 2004, at the [facility]  emergency room.  The patient was admitted at 2 p.m.  The consult was  requested at 6:30 p.m.   This is a pleasant 65 year old Caucasian right-handed female patient of Dr.  Alwyn Ren, Dr. Verdis Prime, and Dr. Marcelino Freestone.  She has a history of  migraines, which were in detail evaluated two years ago on an outpatient  basis.  Workup included an MRI and MRA at the time that the patient recalls  being negative.  She presented today with a tingling numbness in the right  arm and face but also felt weak in the same distribution and at the same  time had a retro-orbital and temporal right-sided headache and some nausea.  The patient presented to the ER after having called Dr. Alwyn Ren, and his  office nurse told her to come to the ER instead.  Dr. Freida Busman here evaluated  the patient by a head CT, which was negative for stroke.  A sedimentation  rate was taken, which is still pending.  The patient had a normal Chem-7 and  metabolic profile a normal PT.  Her INR was 0.9.  She said that she takes  aspirin at home.  Here in the ER, she received 0.5 mg of Dilaudid for her  headache.  The patient was at the time of her admission in regular sinus  rhythm and has remained so.  Blood pressure on admission was 180 systolic  over 96 diastolic.  Respiratory rate was 18.  Temperature was normal  temperature.  She has now a blood pressure of 140/60 with resolution of  headaches and nausea after being given  Dilaudid.  She has also no further  tingling, numbness or weakness.   PHYSICAL EXAMINATION:  CHEST:  The patient shows clear lungs to  auscultation.  CARDIAC:  No cardiac murmur.  NECK:  No carotid bruits.  NEUROLOGIC:  There is no facial asymmetry noticed, no bruits.  The patient  has no dysarthria or aphasia.  The pupils are equal to light and  accommodation, full extraocular movements.  No temporal arteries palpable.  Motor examination shows equal strength, tone and mass.  The patient can  extend all extremities for longer than 12 seconds against gravity and shows  no drift.  Deep tendon reflexes are 1+.  Toes are downgoing to plantar  stimulation.  Finger-to-nose test is intact without dysmetria, ataxia or  tremor.  The patient is slightly impaired in her gait secondary to being  drowsy from __________.   ASSESSMENT:  Complicated migraine resolution after receiving __________.  The patient  should continue aspirin at home for her regular vascular  prophylaxis and see Dr. Orlin Hilding for follow-up within this week.  I have also  discussed with her that she can obtain outpatient neurovascular evaluation  through Dr. Orlin Hilding pending on her assessment.  I do see at this time no  reason for admission.  I will give the patient a Midrin prescription for  migraine medication to use at home.      CD/MEDQ  D:  06/05/2004  T:  06/06/2004  Job:  [id number]   cc:   Santina Evans A. Orlin Hilding, M.D.  [address]  [address]  [place]  [place] [address]  Fax: [phone]   Titus Dubin. Alwyn Ren, M.D. [facility]   Lyn Records III, M.D.  [address]  [address]  [place]  [place] [address]  Fax: [phone]

## 2010-09-01 ENCOUNTER — Encounter: Payer: Self-pay | Admitting: Internal Medicine

## 2010-09-19 ENCOUNTER — Other Ambulatory Visit: Payer: Self-pay | Admitting: Internal Medicine

## 2010-09-19 DIAGNOSIS — E876 Hypokalemia: Secondary | ICD-10-CM

## 2010-09-19 DIAGNOSIS — E039 Hypothyroidism, unspecified: Secondary | ICD-10-CM

## 2010-09-20 ENCOUNTER — Other Ambulatory Visit (INDEPENDENT_AMBULATORY_CARE_PROVIDER_SITE_OTHER): Payer: Medicare Other

## 2010-09-20 DIAGNOSIS — E039 Hypothyroidism, unspecified: Secondary | ICD-10-CM

## 2010-09-20 DIAGNOSIS — E876 Hypokalemia: Secondary | ICD-10-CM

## 2010-09-20 LAB — TSH: TSH: 3.7 u[IU]/mL (ref 0.35–5.50)

## 2010-09-20 LAB — POTASSIUM: Potassium: 4.1 mEq/L (ref 3.5–5.1)

## 2010-09-20 LAB — T3, FREE: T3, Free: 2.6 pg/mL (ref 2.3–4.2)

## 2010-09-21 LAB — T4, FREE: Free T4: 0.99 ng/dL (ref 0.60–1.60)

## 2010-10-25 ENCOUNTER — Other Ambulatory Visit: Payer: Self-pay | Admitting: Thoracic Surgery (Cardiothoracic Vascular Surgery)

## 2010-10-25 DIAGNOSIS — I7100 Dissection of unspecified site of aorta: Secondary | ICD-10-CM

## 2010-10-31 ENCOUNTER — Other Ambulatory Visit: Payer: Self-pay | Admitting: Internal Medicine

## 2010-10-31 MED ORDER — LORAZEPAM 1 MG PO TABS: 1.0000 mg | ORAL_TABLET | Freq: Every day | ORAL | Status: DC | PRN

## 2010-10-31 NOTE — Telephone Encounter (Signed)
rx sent to pharmacy

## 2010-11-24 ENCOUNTER — Other Ambulatory Visit: Payer: Self-pay | Admitting: Thoracic Surgery (Cardiothoracic Vascular Surgery)

## 2010-11-25 LAB — CREATININE, SERUM: Creat: 0.97 mg/dL (ref 0.50–1.10)

## 2010-11-25 LAB — BUN: BUN: 18 mg/dL (ref 6–23)

## 2010-11-27 ENCOUNTER — Encounter (INDEPENDENT_AMBULATORY_CARE_PROVIDER_SITE_OTHER): Payer: Medicare Other | Admitting: Thoracic Surgery (Cardiothoracic Vascular Surgery)

## 2010-11-27 ENCOUNTER — Ambulatory Visit
Admission: RE | Admit: 2010-11-27 | Discharge: 2010-11-27 | Disposition: A | Discharge status: Home / Self Care | Payer: Medicare Other | Source: Ambulatory Visit | Attending: Thoracic Surgery (Cardiothoracic Vascular Surgery) | Admitting: Thoracic Surgery (Cardiothoracic Vascular Surgery)

## 2010-11-27 DIAGNOSIS — I7100 Dissection of unspecified site of aorta: Secondary | ICD-10-CM

## 2010-11-27 DIAGNOSIS — I7101 Dissection of thoracic aorta: Secondary | ICD-10-CM

## 2010-11-27 DIAGNOSIS — I2510 Atherosclerotic heart disease of native coronary artery without angina pectoris: Secondary | ICD-10-CM

## 2010-11-27 MED ORDER — IOHEXOL 300 MG/ML  SOLN: 100.0000 mL | Freq: Once | INTRAMUSCULAR | Status: AC | PRN

## 2010-11-28 NOTE — Assessment & Plan Note (Signed)
OFFICE VISIT  ELLENORA, TALTON DOB:  1945/09/16                                        November 27, 2010 CHART #:  [id number]  REASON FOR VISIT:  Six-month followup after repair of aortic dissection.  Ms. 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  She had some ostial narrowing of the right coronary artery.  She had repair of the type 1 dissection under deep hypothermic circulatory arrest and coronary artery bypass grafting x1 with a saphenous vein to her right coronary artery that was on May 03, 2010.  She was readmitted with atrial fibrillation a few days later, but overall was doing very well and I saw her back in the office in February.  She states that since then she has overall been doing well. She has not had any type of pain remotely similar to what she had with her dissection.  She says her blood pressure has been controlled, usually running about 120/70.  She is checking it at home as well as multiple doctors' visits.  She does state that she still has some pains mainly on the left side of her chest and sometimes feels like clicking sensation or noise when she lies down and this tends to be she thought possibly from her throat from where she was intubated.  CURRENT MEDICATIONS: 1. Aspirin 81 mg daily. 2. Coumadin 2.5 mg daily. 3. Lopressor 75 mg b.i.d. 4. Amlodipine/benazepril 5/20 one tablet daily. 5. Synthroid 75 mcg daily. 6. Calcium carbonate twice daily. 7. Fish oil 1000 mg daily. 8. Amiodarone 200 mg daily. 9. Lexapro 10 mg daily. 10.Hydrochlorothiazide 12.5 mg daily. 11.Ativan 1 mg nightly.  She has no known drug allergies.  PHYSICAL EXAMINATION:  VITAL SIGNS:  Today, her blood pressure is 170/96, pulse 64, respirations 18, and oxygen saturations 96% on room air. CARDIAC:  Regular rate and rhythm.  There is no murmur. CHEST:  Her sternum is stable.  Sternal incision is clean,  dry, intact. NECK:  She has no carotid bruits. EXTREMITIES:  She has palpable pulses throughout. LUNGS:  Clear with equal breath sounds.  CT angio of the chest is reviewed and compared to her film from when she presented with type 1 dissection which extended from just above the valve all the way down into her abdominal aorta.  On her CT angio today, there is essentially no trace of a false lumen and no signs that the ascending aorta has been replaced, but the remainder of the aorta appears relatively normal down into the abdomen.  IMPRESSION:  Ms. Carmon is a 65 year old woman status post replacement of ascending aorta for a type 1 dissection along with bypass of right coronary artery back in January.  Overall, I think she is doing well. She still has some pains which all likelihood related to the incisions. It is quite possible that with some osteoporosis she had some rib fractures and maybe part of the reason she is having some more chronic pain issues.  These really are not severe or life altering, although it does limit some of the heavier activity she might be doing.  From a standpoint of her CT angio, I am really excited to see that there is no residual evidence of false lumen. Although it is not a guarantee, it has been shown to  be less likely to result in late aneurysmal dilatation when that is found.  I am concerned by her blood pressure today.  It was elevated in the office, but she just had a CT angio and had rushed upstairs and has another appointment later this afternoon, so it may be somewhat related to the stressors of the day.  She says that she is checking it on a regular basis and tends to run in the 120s systolic, which is right where we want her, so I did not make any medication changes today. But I told her to keep an eye on her blood pressure and if it were to continue to climb then she might need an adjustment to her medication doses.  I am going to plan to see  her back in 6 months.  We will repeat her CT angio.  I do think she will need to continue to be followed even though her risk for aneurysmal dilatation may be less, she still has some risks for that, so after I see her back in 6 months which will be the 1-year followup visit, beyond that we can just see her back once a year.  Salvatore Decent Dorris Fetch, M.D. Electronically Signed  SCH/MEDQ  D:  11/27/2010  T:  11/28/2010  Job:  [id number]  cc:   Thereasa Solo. Little, M.D. Titus Dubin. Alwyn Ren, MD,FACP,FCCP

## 2010-12-04 ENCOUNTER — Encounter: Payer: Medicare Other | Admitting: Thoracic Surgery (Cardiothoracic Vascular Surgery)

## 2011-01-08 ENCOUNTER — Other Ambulatory Visit: Payer: Self-pay | Admitting: Internal Medicine

## 2011-01-08 MED ORDER — LORAZEPAM 1 MG PO TABS: 1.0000 mg | ORAL_TABLET | Freq: Every day | ORAL | Status: DC | PRN

## 2011-01-08 NOTE — Telephone Encounter (Signed)
Rx sent 

## 2011-03-14 ENCOUNTER — Other Ambulatory Visit: Payer: Self-pay | Admitting: Internal Medicine

## 2011-03-14 NOTE — Telephone Encounter (Signed)
Last OV noted 07-26-10 last refill 01-08-11

## 2011-03-15 MED ORDER — LORAZEPAM 1 MG PO TABS: 1.0000 mg | ORAL_TABLET | Freq: Every day | ORAL | Status: DC | PRN

## 2011-03-15 NOTE — Telephone Encounter (Signed)
Addended by: Maurice Small on: 03/15/2011 11:23 AM   Modules accepted: Orders

## 2011-04-06 ENCOUNTER — Ambulatory Visit
Admission: RE | Admit: 2011-04-06 | Discharge: 2011-04-06 | Disposition: A | Discharge status: Home / Self Care | Payer: Medicare Other | Source: Ambulatory Visit | Attending: Oncology | Admitting: Oncology

## 2011-04-06 DIAGNOSIS — Z853 Personal history of malignant neoplasm of breast: Secondary | ICD-10-CM

## 2011-04-17 ENCOUNTER — Ambulatory Visit (INDEPENDENT_AMBULATORY_CARE_PROVIDER_SITE_OTHER): Payer: Medicare Other

## 2011-04-17 DIAGNOSIS — Z23 Encounter for immunization: Secondary | ICD-10-CM

## 2011-04-19 ENCOUNTER — Other Ambulatory Visit: Payer: Self-pay | Admitting: Thoracic Surgery (Cardiothoracic Vascular Surgery)

## 2011-04-19 DIAGNOSIS — I7101 Dissection of thoracic aorta: Secondary | ICD-10-CM

## 2011-04-19 DIAGNOSIS — I2510 Atherosclerotic heart disease of native coronary artery without angina pectoris: Secondary | ICD-10-CM

## 2011-05-07 ENCOUNTER — Encounter: Payer: Self-pay | Admitting: Internal Medicine

## 2011-05-29 ENCOUNTER — Encounter: Payer: Medicare Other | Admitting: Thoracic Surgery (Cardiothoracic Vascular Surgery)

## 2011-05-29 ENCOUNTER — Other Ambulatory Visit: Payer: Self-pay | Admitting: *Deleted

## 2011-05-29 ENCOUNTER — Encounter: Payer: PRIVATE HEALTH INSURANCE | Admitting: Thoracic Surgery (Cardiothoracic Vascular Surgery)

## 2011-05-29 ENCOUNTER — Other Ambulatory Visit: Payer: Medicare Other

## 2011-05-29 DIAGNOSIS — E039 Hypothyroidism, unspecified: Secondary | ICD-10-CM

## 2011-05-29 MED ORDER — LEVOTHYROXINE SODIUM 75 MCG PO TABS: 75.0000 ug | ORAL_TABLET | Freq: Every day | ORAL | Status: DC

## 2011-05-29 NOTE — Telephone Encounter (Signed)
Rx sent 

## 2011-05-31 MED ORDER — LORAZEPAM 1 MG PO TABS: 1.0000 mg | ORAL_TABLET | Freq: Every day | ORAL | Status: DC | PRN

## 2011-05-31 NOTE — Telephone Encounter (Signed)
Lorazepam sent in also

## 2011-06-27 DIAGNOSIS — Z8679 Personal history of other diseases of the circulatory system: Secondary | ICD-10-CM | POA: Insufficient documentation

## 2011-06-28 ENCOUNTER — Ambulatory Visit
Admission: RE | Admit: 2011-06-28 | Discharge: 2011-06-28 | Disposition: A | Discharge status: Home / Self Care | Payer: BLUE CROSS/BLUE SHIELD | Source: Ambulatory Visit | Attending: Thoracic Surgery (Cardiothoracic Vascular Surgery) | Admitting: Thoracic Surgery (Cardiothoracic Vascular Surgery)

## 2011-06-28 ENCOUNTER — Encounter: Payer: Medicare Other | Admitting: Thoracic Surgery (Cardiothoracic Vascular Surgery)

## 2011-06-28 DIAGNOSIS — I2510 Atherosclerotic heart disease of native coronary artery without angina pectoris: Secondary | ICD-10-CM

## 2011-06-28 DIAGNOSIS — I7101 Dissection of thoracic aorta: Secondary | ICD-10-CM

## 2011-06-28 MED ORDER — IOHEXOL 350 MG/ML SOLN
100.0000 mL | Freq: Once | INTRAVENOUS | Status: AC | PRN
  Administered 2011-06-28: 100 mL via INTRAVENOUS

## 2011-07-03 ENCOUNTER — Ambulatory Visit (INDEPENDENT_AMBULATORY_CARE_PROVIDER_SITE_OTHER): Payer: Medicare Other | Admitting: Thoracic Surgery (Cardiothoracic Vascular Surgery)

## 2011-07-03 ENCOUNTER — Encounter: Payer: Self-pay | Admitting: Thoracic Surgery (Cardiothoracic Vascular Surgery)

## 2011-07-03 VITALS — BP 165/81 | HR 55 | Resp 16 | Ht 65.0 in | Wt 244.0 lb

## 2011-07-03 DIAGNOSIS — Z09 Encounter for follow-up examination after completed treatment for conditions other than malignant neoplasm: Secondary | ICD-10-CM

## 2011-07-03 DIAGNOSIS — I7100 Dissection of unspecified site of aorta: Secondary | ICD-10-CM

## 2011-07-03 NOTE — Progress Notes (Signed)
HPI: Breanna Hernandez is a 66 year old woman who underwent repair of a type I aortic dissection on 05/03/2010. She also had coronary bypass grafting times one with a graft to her right coronary at that time. She was last seen in the office in August for 6 month followup at which time she was doing well. Since then she's continued to do well. She states that her blood pressures have been running high systolic but normal diastolic. She states that she occasionally is very aware of her heartbeat and feels like her heart is pounding in her chest, this is usually when she is lying down at night trying to go to sleep. She says that her heart is not beating rapidly, just that it feels very strong.  She recently was found to be relatively hypothyroid and her Synthroid dose was increased to  Current Outpatient Prescriptions  Medication Sig Dispense Refill  . amiodarone (PACERONE) 200 MG tablet Take 200 mg by mouth daily.        Marland Kitchen amLODipine (NORVASC) 5 MG tablet Take 5 mg by mouth daily.      Marland Kitchen aspirin 81 MG tablet Take 81 mg by mouth daily.        . benazepril (LOTENSIN) 20 MG tablet Take 20 mg by mouth daily.      . Calcium Carb-Cholecalciferol (LIQUID CALCIUM WITH D3) [phone] MG-UNIT CAPS Take by mouth 2 (two) times daily.      . fish oil-omega-3 fatty acids 1000 MG capsule Take 2 g by mouth daily.      Marland Kitchen levothyroxine (SYNTHROID) 75 MCG tablet Take 1 tablet (75 mcg total) by mouth daily.  30 tablet  0  . LORazepam (ATIVAN) 1 MG tablet Take 1 tablet (1 mg total) by mouth daily as needed.  30 tablet  0  . metoprolol (LOPRESSOR) 50 MG tablet Take 50 mg by mouth as directed. 1 1/2 by mouth twice daily       . warfarin (COUMADIN) 2.5 MG tablet Take 2.5 mg by mouth as directed. 2 by mouth daily or as directed by coumadin clinic       . HYDROcodone-acetaminophen (VICODIN) 5-500 MG per tablet Take 1 tablet by mouth as directed. 1-2 by mouth every 6 hours as needed only          Physical Exam: BP 165/81  Pulse  55  Resp 16  Ht 5\' 5"  (1.651 m)  Wt 244 lb (110.678 kg)  BMI 40.60 kg/m2  SpO2 92% Gen. obese in no acute distress Neurologic alert and oriented x3 no focal deficits Lungs clear equal breath sounds bilaterally Cardiac exam regular rate and rhythm, 2/6 systolic murmur, no diastolic murmur 2+ pulses throughout No carotid bruits  Diagnostic Tests: CT of the chest is reviewed. Shows a stable repair, the distal arch and descending aorta appear relatively normal, no false lumen is apparent.  Impression: 66 year old woman now one year out from repair of an aortic dissection. She really is doing well at this point in time. She does still have some systolic hypertension. Unfortunately her heart rate is to loaded try and push her beta blocker dose. Fortunately her diastolic blood pressures are within normal limits.  Typically after a type I dissection we would follow with yearly CT angios. However, given the essentially normal appearance of her aorta and her concerns about radiation exposure, I think it would be reasonable in her case to every other year unless a change is noted. I did discuss with her the possibility doing  MR angios, but she has had a bad experience with MR in the past.  Plan: Return in 2 years with CT angiogram to assess aortic arch and descending thoracic aorta

## 2011-07-16 ENCOUNTER — Other Ambulatory Visit: Payer: Self-pay | Admitting: Internal Medicine

## 2011-07-25 ENCOUNTER — Other Ambulatory Visit: Payer: Self-pay | Admitting: Internal Medicine

## 2011-07-25 ENCOUNTER — Other Ambulatory Visit: Payer: Self-pay | Admitting: *Deleted

## 2011-07-25 MED ORDER — LEVOTHYROXINE SODIUM 75 MCG PO TABS: ORAL_TABLET | ORAL | Status: DC

## 2011-07-25 NOTE — Telephone Encounter (Signed)
RX called in .

## 2011-08-22 ENCOUNTER — Telehealth: Payer: Self-pay | Admitting: Oncology

## 2011-08-22 NOTE — Telephone Encounter (Signed)
lmonvm adviisng the pt of her may 2013 appt for lab and to see dr Cyndie Chime

## 2011-08-24 ENCOUNTER — Telehealth: Payer: Self-pay | Admitting: Oncology

## 2011-08-24 NOTE — Telephone Encounter (Signed)
Pt called and left message , called pt back , left message that appt with MD 1st opening is in July 2013, waiting for a call back

## 2011-08-28 ENCOUNTER — Other Ambulatory Visit: Payer: Medicare Other | Admitting: Lab

## 2011-08-29 ENCOUNTER — Other Ambulatory Visit (HOSPITAL_BASED_OUTPATIENT_CLINIC_OR_DEPARTMENT_OTHER): Payer: Medicare Other | Admitting: Lab

## 2011-08-29 DIAGNOSIS — E039 Hypothyroidism, unspecified: Secondary | ICD-10-CM

## 2011-08-29 LAB — CBC WITH DIFFERENTIAL/PLATELET
Basophils Absolute: 0 10*3/uL (ref 0.0–0.1)
Eosinophils Absolute: 0.1 10*3/uL (ref 0.0–0.5)
HCT: 39 % (ref 34.8–46.6)
HGB: 13.4 g/dL (ref 11.6–15.9)
LYMPH%: 30.1 % (ref 14.0–49.7)
MCV: 100.1 fL (ref 79.5–101.0)
MONO%: 9.4 % (ref 0.0–14.0)
NEUT#: 3.3 10*3/uL (ref 1.5–6.5)
NEUT%: 58.9 % (ref 38.4–76.8)
Platelets: 184 10*3/uL (ref 145–400)
RDW: 12.9 % (ref 11.2–14.5)

## 2011-08-29 LAB — LACTATE DEHYDROGENASE: LDH: 153 U/L (ref 94–250)

## 2011-08-29 LAB — COMPREHENSIVE METABOLIC PANEL
CO2: 26 mEq/L (ref 19–32)
Glucose, Bld: 87 mg/dL (ref 70–99)
Sodium: 140 mEq/L (ref 135–145)
Total Bilirubin: 1 mg/dL (ref 0.3–1.2)
Total Protein: 6.8 g/dL (ref 6.0–8.3)

## 2011-09-04 ENCOUNTER — Telehealth: Payer: Self-pay | Admitting: Oncology

## 2011-09-04 ENCOUNTER — Ambulatory Visit (HOSPITAL_BASED_OUTPATIENT_CLINIC_OR_DEPARTMENT_OTHER): Payer: Medicare Other | Admitting: Oncology

## 2011-09-04 ENCOUNTER — Other Ambulatory Visit: Payer: Self-pay | Admitting: Internal Medicine

## 2011-09-04 VITALS — BP 146/72 | HR 54 | Temp 96.9°F | Wt 244.2 lb

## 2011-09-04 DIAGNOSIS — Z853 Personal history of malignant neoplasm of breast: Secondary | ICD-10-CM

## 2011-09-04 DIAGNOSIS — E039 Hypothyroidism, unspecified: Secondary | ICD-10-CM

## 2011-09-04 DIAGNOSIS — I10 Essential (primary) hypertension: Secondary | ICD-10-CM

## 2011-09-04 DIAGNOSIS — C50919 Malignant neoplasm of unspecified site of unspecified female breast: Secondary | ICD-10-CM

## 2011-09-04 NOTE — Telephone Encounter (Signed)
gv pt appt schedule for may 2014 and mammo for 04/2012

## 2011-09-04 NOTE — Progress Notes (Signed)
Hematology and Oncology Follow Up Visit  Breanna Hernandez [id number] [birth year] 66 y.o. 09/04/2011 5:34 PM   Principle Diagnosis: Encounter Diagnosis  Name Primary?  . Breast CA Yes     Interim History:  Followup visit for this pleasant 66 year old woman with a history of Metachronous bilateral breast cancers.  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  Started on hormonal therapy with Aromasin which she stopped due to excessive weight gain.  She's had no interim medical problems. Her chest wall remains sore status post emergency surgery last year for a type I aortic dissection. She denies any bone pain. No headache or change in vision. No change in bowel habit. No vaginal bleeding or discharge.   Medications: reviewed  Allergies: No Known Allergies  Review of Systems: Constitutional:  No constitutional symptoms  Respiratory: No cough or dyspnea Cardiovascular: She gets a rapid heartbeat but doesn't feel it is palpitations. This is occurring 2-3 times a week. Not associated with dyspnea. She was told by her cardiologist at this was likely not significant.  Gastrointestinal: No change in bowel habit Genito-Urinary: No urinary tract symptoms. Musculoskeletal: See above Neurologic: See above Skin: No rash or ecchymosis General: Her hair is thinning out. Remaining ROS negative.  Physical Exam: Blood pressure 146/72, pulse 54, temperature 96.9 F (36.1 C), temperature source Oral, weight 244 lb 3.2 oz (110.768 kg). Wt Readings from Last 3 Encounters:  09/04/11 244 lb 3.2 oz (110.768 kg)  07/03/11 244 lb (110.678 kg)  07/26/10 232 lb 6.4 oz (105.416 kg)     General appearance: Well-nourished Caucasian  woman HENNT: Midline thyroidectomy scar in her neck. Pharynx no erythema or exudate. Lymph nodes: No cervical supraclavicular or axillary adenopathy Breasts: Surgical changes in both breasts. No dominant mass in either breast. Lungs: Clear to auscultation resonant to percussion. Heart: Regular rhythm with a 2-3/6 pansystolic murmur loudest at the left sternal border Abdomen: Soft obese nontender no mass no organomegaly Extremities: No edema no calf tenderness Vascular: No cyanosis Neurologic: Motor strength 5 over 5, reflexes 1+ symmetric Skin: No rash or ecchymosis  Lab Results: Lab Results  Component Value Date   WBC 5.6 08/29/2011   HGB 13.4 08/29/2011   HCT 39.0 08/29/2011   MCV 100.1 08/29/2011   PLT 184 08/29/2011     Chemistry      Component Value Date/Time   NA 140 08/29/2011 1035   K 4.2 08/29/2011 1035   CL 104 08/29/2011 1035   CO2 26 08/29/2011 1035   BUN 19 08/29/2011 1035   CREATININE 0.90 08/29/2011 1035   CREATININE 0.97 11/24/2010 0000      Component Value Date/Time   CALCIUM 9.3 08/29/2011 1035   ALKPHOS 62 08/29/2011 1035   AST 22 08/29/2011 1035   ALT 23 08/29/2011 1035   BILITOT 1.0 08/29/2011 1035       Radiological Studies: Most recent mammogram done 03/29/2011 showed no new disease.   Impression and Plan: #1. Metachronous primary bilateral invasive and noninvasive breast cancers treated as outlined above. She remains free of any new disease now out 13 years from initial DCIS, 8 years from second DCIS, and 6 years from the invasive cancer. Plan is continue annual followup and mammograms. Routine laboratory studies no longer felt necessary in  stable breast cancer patients who do not have stage IV disease. She can get her annual labs done through her primary care physician's office if there are other indications for these labs. I reassured her that if she developed any new symptom or physical finding that this would be followed up with the appropriate  studies.  #2. Status post emergency surgery for a type I aortic dissection 05/03/2010  #3.  status post previous thyroidectomy for benign disease currently hypothyroid on replacement.  #4. Essential hypertension.  #5. GERD.  #6. 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  #7. Fluctuating mild transaminase elevations. Liver functions have been normal over the last year.   CC:. Dr. Marga Melnick; Dr. Cicero Duck; Dr. Teodora Medici; Dr. Caprice Kluver   Levert Feinstein, MD 5/28/20135:34 PM

## 2011-09-05 NOTE — Telephone Encounter (Signed)
Last OV, 07/26/2010. Medication last filled on 07/25/11, #30 with an indication OV due. No pending appointment schedule

## 2011-09-05 NOTE — Telephone Encounter (Signed)
OK #30; unfortunately it has been  over a year since she's been seen. Any additional refills would require office visit followup as per the [facility]  medical board standard of care

## 2011-10-23 ENCOUNTER — Other Ambulatory Visit: Payer: Self-pay | Admitting: Internal Medicine

## 2011-10-25 NOTE — Telephone Encounter (Signed)
OK X1 

## 2011-10-25 NOTE — Telephone Encounter (Signed)
Dr.Hopper please advise on refill request,  last OV 07/2010, patient informed office visit over-due. No pending appointment

## 2011-11-10 ENCOUNTER — Other Ambulatory Visit: Payer: Self-pay | Admitting: Internal Medicine

## 2011-11-12 NOTE — Telephone Encounter (Signed)
Patient needs to schedule a CPX  

## 2011-12-06 ENCOUNTER — Other Ambulatory Visit: Payer: Self-pay | Admitting: Internal Medicine

## 2011-12-07 NOTE — Telephone Encounter (Signed)
Ok to refill 

## 2011-12-07 NOTE — Telephone Encounter (Signed)
Refill done.  

## 2011-12-07 NOTE — Telephone Encounter (Signed)
Ok ativan #30, no RF

## 2011-12-13 ENCOUNTER — Other Ambulatory Visit: Payer: Self-pay | Admitting: Internal Medicine

## 2012-01-10 ENCOUNTER — Other Ambulatory Visit: Payer: Self-pay | Admitting: Internal Medicine

## 2012-01-11 ENCOUNTER — Other Ambulatory Visit: Payer: Self-pay | Admitting: Internal Medicine

## 2012-01-11 NOTE — Telephone Encounter (Signed)
Patient needs to have TSH 244.9, appointment 3-5 days later for a follow-up and CPX near future (3 appointments)

## 2012-01-14 NOTE — Telephone Encounter (Signed)
Rx printed.     MW

## 2012-01-14 NOTE — Telephone Encounter (Signed)
OK #30 

## 2012-01-14 NOTE — Telephone Encounter (Signed)
Ok to refill 

## 2012-02-08 ENCOUNTER — Other Ambulatory Visit: Payer: Self-pay | Admitting: Internal Medicine

## 2012-02-12 ENCOUNTER — Encounter: Payer: Self-pay | Admitting: Internal Medicine

## 2012-02-15 ENCOUNTER — Ambulatory Visit: Payer: Medicare Other | Admitting: Internal Medicine

## 2012-02-18 ENCOUNTER — Other Ambulatory Visit: Payer: Self-pay | Admitting: Internal Medicine

## 2012-02-19 NOTE — Telephone Encounter (Signed)
Rx sent for # 30 no refills to normal pharmacy. Pt needs OV last seen 07/26/10       MW

## 2012-02-22 NOTE — Telephone Encounter (Signed)
lmovm

## 2012-02-29 ENCOUNTER — Other Ambulatory Visit: Payer: Self-pay | Admitting: Internal Medicine

## 2012-02-29 NOTE — Telephone Encounter (Signed)
Ok to refill 

## 2012-02-29 NOTE — Telephone Encounter (Signed)
I called patient and informed her that she is overdue for an appointment. Per Dr.Hopper (gave verbal-was standing in front of me at the time of call) , # 30 and no more refills with out an appointment. Patient verbalized understanding that she is due for an appointment. Patient was unable to check her schedule at the time of call and indicated that she would check and call back to schedule an appointment.

## 2012-02-29 NOTE — Telephone Encounter (Signed)
Pt last OV 07/26/10 pt needs OV plz advise   MW

## 2012-03-12 ENCOUNTER — Encounter: Payer: Self-pay | Admitting: *Deleted

## 2012-03-12 ENCOUNTER — Ambulatory Visit (INDEPENDENT_AMBULATORY_CARE_PROVIDER_SITE_OTHER)
Admission: RE | Admit: 2012-03-12 | Discharge: 2012-03-12 | Disposition: A | Discharge status: Home / Self Care | Payer: Medicare Other | Source: Ambulatory Visit | Attending: Physician Assistant | Admitting: Physician Assistant

## 2012-03-12 ENCOUNTER — Ambulatory Visit (INDEPENDENT_AMBULATORY_CARE_PROVIDER_SITE_OTHER): Payer: Medicare Other | Admitting: Physician Assistant

## 2012-03-12 ENCOUNTER — Telehealth: Payer: Self-pay | Admitting: *Deleted

## 2012-03-12 ENCOUNTER — Telehealth: Payer: Self-pay | Admitting: Internal Medicine

## 2012-03-12 ENCOUNTER — Other Ambulatory Visit (INDEPENDENT_AMBULATORY_CARE_PROVIDER_SITE_OTHER): Payer: Medicare Other

## 2012-03-12 VITALS — BP 134/72 | HR 56 | Ht 64.0 in | Wt 249.0 lb

## 2012-03-12 DIAGNOSIS — R109 Unspecified abdominal pain: Secondary | ICD-10-CM

## 2012-03-12 DIAGNOSIS — R52 Pain, unspecified: Secondary | ICD-10-CM

## 2012-03-12 DIAGNOSIS — Z8679 Personal history of other diseases of the circulatory system: Secondary | ICD-10-CM

## 2012-03-12 DIAGNOSIS — K559 Vascular disorder of intestine, unspecified: Secondary | ICD-10-CM

## 2012-03-12 DIAGNOSIS — R197 Diarrhea, unspecified: Secondary | ICD-10-CM

## 2012-03-12 LAB — BASIC METABOLIC PANEL
CO2: 30 mEq/L (ref 19–32)
Glucose, Bld: 101 mg/dL — ABNORMAL HIGH (ref 70–99)
Potassium: 4.2 mEq/L (ref 3.5–5.1)
Sodium: 138 mEq/L (ref 135–145)

## 2012-03-12 LAB — CBC WITH DIFFERENTIAL/PLATELET
Eosinophils Relative: 0.6 % (ref 0.0–5.0)
HCT: 39.5 % (ref 36.0–46.0)
Lymphs Abs: 2.1 10*3/uL (ref 0.7–4.0)
Monocytes Relative: 8.1 % (ref 3.0–12.0)
Neutrophils Relative %: 68.9 % (ref 43.0–77.0)
Platelets: 177 10*3/uL (ref 150.0–400.0)
WBC: 9.6 10*3/uL (ref 4.5–10.5)

## 2012-03-12 MED ORDER — IOHEXOL 350 MG/ML SOLN
100.0000 mL | Freq: Once | INTRAVENOUS | Status: AC | PRN
  Administered 2012-03-12: 100 mL via INTRAVENOUS

## 2012-03-12 MED ORDER — DICYCLOMINE HCL 10 MG PO CAPS: 10.0000 mg | ORAL_CAPSULE | Freq: Three times a day (TID) | ORAL | Status: DC

## 2012-03-12 NOTE — Progress Notes (Signed)
Subjective:    Patient ID: Breanna Hernandez, female    DOB: [birth year], 66 y.o.   MRN: [id number]  HPI Breanna Hernandez is a very nice 66 year old white female known to Dr. Lina Sar with history of bilateral breast cancers, status post lumpectomy Z. and radiation. She also has history of hypertension hyperlipidemia hypothyroidism morbid obesity, and had an aortic dissection in January of 2012 for which she underwent emergency surgery with Dr. Dorris Fetch. 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 Postoperatively she developed atrial fibrillation and has been on Coumadin since. She was last seen here in October of 2011 when she had colonoscopy which was negative with the exception of mild left-sided diverticulosis.  She comes in today as an urgent add-on, after onset late yesterday afternoon with severe abrupt mid to lower abdominal pain followed by a "hot feeling" all over and then multiple bowel movements eventually becoming bloody by about 9:30 last evening. She says the first several bowel movements did not maintain any blood and then late last night she started passing small amounts of red blood with mucus. She only slept for about 3 or 4 hours and then this morning has had 2 episodes of bowel movements with just dark red blood and mucus. She says the pain is not nearly as bad at this point, she does feel sore all over her abdomen. She has had some nausea, but no vomiting, no fever or chills. She drank some water and ate a cracker this morning. She was able to drive herself to our office, denies dizziness etc. She has not had any recent changes in her medications, no recent antibiotics. She is says that she has had IBS episodes with cramping and diarrhea but that this was completely different and much more intense.    Review of Systems  Constitutional: Positive for appetite change.  HENT: Negative.   Eyes: Negative.   Respiratory: Negative.   Cardiovascular: Negative.    Gastrointestinal: Positive for abdominal pain, diarrhea and blood in stool.  Genitourinary: Negative.   Musculoskeletal: Negative.   Neurological: Negative.   Hematological: Negative.   Psychiatric/Behavioral: Negative.    Outpatient Prescriptions Prior to Visit  Medication Sig Dispense Refill  . amiodarone (PACERONE) 200 MG tablet Take 100 mg by mouth daily.       Marland Kitchen amLODipine (NORVASC) 5 MG tablet Take 5 mg by mouth daily.      Marland Kitchen aspirin 81 MG tablet Take 81 mg by mouth daily.        . benazepril (LOTENSIN) 20 MG tablet Take 40 mg by mouth daily.       . Calcium Carb-Cholecalciferol (LIQUID CALCIUM WITH D3) [phone] MG-UNIT CAPS Take by mouth 3 (three) times daily.       . fish oil-omega-3 fatty acids 1000 MG capsule Take by mouth 3 (three) times daily.       . hydrochlorothiazide (HYDRODIURIL) 12.5 MG tablet Take 12.5 mg by mouth daily.      Marland Kitchen levothyroxine (SYNTHROID, LEVOTHROID) 75 MCG tablet TAKE ONE TABLET BY MOUTH EVERY DAY **NEED TO CALL FOR AN APPOINTMENT**  30 tablet  0  . LORazepam (ATIVAN) 1 MG tablet TAKE ONE TABLET BY MOUTH EVERY DAY AS NEEDED  30 tablet  0  . metoprolol (LOPRESSOR) 50 MG tablet Take 50 mg by mouth as directed. 1 1/2 by mouth twice daily       . levothyroxine (SYNTHROID, LEVOTHROID) 75 MCG tablet TAKE ONE TABLET BY MOUTH EVERY DAY **NEED  TO CALL FOR AN APPOINTMENT**  30 tablet  0  . warfarin (COUMADIN) 2.5 MG tablet Take 2.5 mg by mouth as directed. 2 by mouth daily or as directed by coumadin clinic        No Known Allergies Patient Active Problem List  Diagnosis  . UNSPECIFIED HYPOTHYROIDISM  . HYPERLIPIDEMIA  . METABOLIC SYNDROME X  . HYPERTENSION  . Esophageal Reflux  . NONSPEC ELEVATION OF LEVELS OF TRANSAMINASE/LDH  . BREAST CANCER, HX OF  . COLONIC POLYPS, HX OF  . History of aortic dissection   History  Substance Use Topics  . Smoking status: Never Smoker   . Smokeless tobacco: Never Used  . Alcohol Use: No   Family History  Problem  Relation Age of Onset  . Aneurysm Father     CNS  . Heart attack Father     x2  . Alzheimer's disease Mother   . Hypertension Mother   . Hypertension Brother   . Goiter Mother   . Colon cancer Neg Hx        Objective:   Physical Exam O. well-developed obese older white female in no acute distress, pleasant blood pressure 134/72 pulse 56 height 5 foot 4 weight 249. HEENT; nontraumatic normocephalic EOMI PERRLA sclera anicteric,Neck; Supple no JVD, Cardiovascular; regular rate and rhythm with S1-S2 no significant murmur rub or gallop, she has a sternal incisional scar. Pulmonary; clear bilaterally, Abdomen; large, soft she is quite tender in the hypogastrium and left upper quadrant left mid quadrant no guarding or rebound no palpable mass or hepatosplenomegaly bowel sounds are active upper abdominal incisional scar, Rectal; not done, Extremities; no clubbing cyanosis or edema,skin warm and dry, Psych; mood and affect normal and appropriate.        Assessment & Plan:  #38  66 year old female with abrupt onset of severe lower abdominal pain, clamminess, diarrhea with eventual development of bloody diarrhea and mucus, onset yesterday afternoon . Her clinical picture is very consistent with an acute segmental ischemic colitis,, also consider infectious colitis though doubt. She is hemodynamically stable, but on chronic Coumadin, and also has history of previous aortic aneurysm repair.  #2 history of atrial fibrillation-on chronic Coumadin #3 status post repair of type I aortic dissection and CABG January 2012 #4 history of bilateral breast cancers #5 obesity #6 hypertension  Plan; CBC with differential, bmet,and pro time now Schedule for CT angio  abdomen and pelvis today-segmental ischemic colitis is generally a small vessel process-however given her history of prior aortic aneurysm repair feel it is important to evaluate her mesenteric vessels. Clear liquid diet with gradual  advancement Bentyl 10 mg 2-3 times daily as needed for cramping and pain Further plans pending results of CT

## 2012-03-12 NOTE — Progress Notes (Signed)
Reviewed and agree. Ischemic colitis likely cause for acute abd. pain.

## 2012-03-12 NOTE — Patient Instructions (Addendum)
Please go to the basement level to have your labs drawn.  We sent a prescription to [facility], [place] for the Dicyclomine for cramping and spasms.  You have been scheduled for a CT  Angiogram scan of the abdomen and pelvis at [facility] ([address]---this is in the same building as Architectural technologist).   You are scheduled on Wed 03-12-2012 Today  at 2:00 Pm . You should arrive at 1:45 PM  prior to your appointment time for registration. Please follow the written instructions below on the day of your exam:  WARNING: IF YOU ARE ALLERGIC TO IODINE/X-RAY DYE, PLEASE NOTIFY RADIOLOGY IMMEDIATELY AT [phone]! YOU WILL BE GIVEN A 13 HOUR PREMEDICATION PREP.  1) Do not eat or drink anything after 10 AM.  (4 hours prior to your test) You may take any medications as prescribed with a small amount of water except for the following: Metformin, Glucophage, Glucovance, Avandamet, Riomet, Fortamet, Actoplus Met, Janumet, Glumetza or Metaglip. The above medications must be held the day of the exam AND 48 hours after the exam.  The purpose of you drinking the oral contrast is to aid in the visualization of your intestinal tract. The contrast solution may cause some diarrhea. Before your exam is started, you will be given a small amount of fluid to drink. Depending on your individual set of symptoms, you may also receive an intravenous injection of x-ray contrast/dye. Plan on being at [facility] for 30 minutes or long, depending on the type of exam you are having performed.  If you have any questions regarding your exam or if you need to reschedule, you may call the CT department at [phone] between the hours of 8:00 am and 5:00 pm, Monday-Friday.  ________________________________________________________________________

## 2012-03-12 NOTE — Telephone Encounter (Signed)
I called the pt per [facility] PA to advise the labs looks good.  The CT Angiogram also looked good.  The blood vessels were fine, no problem with them.  I made the patient a follow up with Dr. Juanda Chance on 04-30-2012 at 3:45 PM.

## 2012-03-12 NOTE — Telephone Encounter (Signed)
Patient calling to report yesterday, she had abdominal cramping and diarrhea x 6-7 times. Last night, she had a diarrhea stool with bright, red blood. This AM, she has had blood and mucous in toilet without stool. She feels "sore" inside her stomach. Denies fever, nausea, vomiting or recent antibiotic use. She does report she is on Coumadin. Scheduled patient with Mike Gip, PA today at 11:00 AM.

## 2012-03-13 ENCOUNTER — Telehealth: Payer: Self-pay | Admitting: Internal Medicine

## 2012-03-13 NOTE — Telephone Encounter (Signed)
Just be sure she has a follow up scheduled with primary GI MD- I believe brodie, and Pam may have already scheduled- thanks

## 2012-03-13 NOTE — Telephone Encounter (Signed)
Clarified with Mike Gip PA the patient is ok to wait for follow up as scheduled.  The patient should feel better and have more energy in the next 4 or 5 days if she is not better by then she should call back.  She verbalized understanding

## 2012-03-13 NOTE — Telephone Encounter (Signed)
Patient reports that she is sore today, no bleeding, fever, or diarrhea.  She is tolerating a bland diet.  She does not feel well.  Amy Esterwood PA what is the next step?

## 2012-03-16 ENCOUNTER — Encounter: Payer: Self-pay | Admitting: Internal Medicine

## 2012-03-18 ENCOUNTER — Encounter: Payer: Self-pay | Admitting: Internal Medicine

## 2012-04-03 ENCOUNTER — Encounter: Payer: Self-pay | Admitting: *Deleted

## 2012-04-10 ENCOUNTER — Other Ambulatory Visit: Payer: Self-pay | Admitting: Internal Medicine

## 2012-04-10 NOTE — Telephone Encounter (Signed)
OK X 1; The medication is " as needed" and should not be taken on a regular basis.  Regular use can increase risk of addiction but more importantly affect level of alertness and balance with increased risk of falling.  

## 2012-04-10 NOTE — Telephone Encounter (Signed)
Last appointment 07/2010, no pending appointment. Hopp please advise if ok to refill?  **Patient will need to sign controlled substance contract

## 2012-04-11 ENCOUNTER — Other Ambulatory Visit: Payer: Self-pay | Admitting: Internal Medicine

## 2012-04-18 ENCOUNTER — Ambulatory Visit
Admission: RE | Admit: 2012-04-18 | Discharge: 2012-04-18 | Disposition: A | Discharge status: Home / Self Care | Payer: Medicare Other | Source: Ambulatory Visit | Attending: Oncology | Admitting: Oncology

## 2012-04-18 ENCOUNTER — Other Ambulatory Visit: Payer: Self-pay | Admitting: Oncology

## 2012-04-18 DIAGNOSIS — C50919 Malignant neoplasm of unspecified site of unspecified female breast: Secondary | ICD-10-CM

## 2012-04-30 ENCOUNTER — Encounter: Payer: Self-pay | Admitting: Internal Medicine

## 2012-04-30 ENCOUNTER — Ambulatory Visit (INDEPENDENT_AMBULATORY_CARE_PROVIDER_SITE_OTHER): Payer: Medicare Other | Admitting: Internal Medicine

## 2012-04-30 ENCOUNTER — Ambulatory Visit: Payer: Medicare Other | Admitting: Internal Medicine

## 2012-04-30 VITALS — BP 128/74 | HR 68 | Ht 64.0 in | Wt 246.4 lb

## 2012-04-30 DIAGNOSIS — K559 Vascular disorder of intestine, unspecified: Secondary | ICD-10-CM

## 2012-04-30 MED ORDER — DICYCLOMINE HCL 20 MG PO TABS: 20.0000 mg | ORAL_TABLET | Freq: Three times a day (TID) | ORAL | Status: DC | PRN

## 2012-04-30 NOTE — Progress Notes (Signed)
Breanna Hernandez 1945-09-23 MRN [id number]  History of Present Illness:  This is a 67 year old white female who comes for followup of acute abdominal pain consistent with ischemic colitis. This happened several weeks ago and she saw Mike Gip, PA-C in the office. Patient at that time was passing blood and mucus for 24 hours. She has been on 3 blood pressure medications at the time of the attack. A CT angiogram done in December 2013 showed aortic calcifications, repaired dissecting aorta and no sign of mesenteric ischemia. She has a history of atrial fibrillation for which she is on Coumadin. She is up-to-date on her colonoscopy with last exam being in October 2011, which showed mild diverticulosis of the left colon. Patient is morbidly obese. She has an irritable bowel syndrome with occasional urgent diarrheal stools and crampy abdominal pain.   Past Medical History  Diagnosis Date  . HTN (hypertension)   . Hypothyroidism   . Breast cancer     x 3  . History of aortic dissection     type 1  . Diverticulosis   . Hyperplastic colon polyp   . Hyperlipidemia   . Obesity   . IBS (irritable bowel syndrome)   . Atrial fibrillation   . Renal cyst, left   . Fatty liver 01/10/07   Past Surgical History  Procedure Date  . Cholecystectomy   . Tonsillectomy   . Colonoscopy w/ polypectomy   . Breast lumpectomy 2000    Right x2, Left x1  . Median sternotomy, extracorporeal circulation, repair of type 1 aortic dissection with  tube graft from  sinotubular junction to take off of innominate artery, distal anastomoses under deep bypothermic circulatory arrest 05/03/2010    Dr Dorris Fetch  . Cabg x 1 05/03/2010    Dr Dorris Fetch (with type 1 aortic dissection)    reports that she has never smoked. She has never used smokeless tobacco. She reports that she does not drink alcohol or use illicit drugs. family history includes Alzheimer's disease in her mother; Aneurysm in her father; Goiter in her  mother; Heart attack in her father; and Hypertension in her brother and mother.  There is no history of Colon cancer. No Known Allergies      Review of Systems: Denies chest pain she has occasional dizziness when she stands up  The remainder of the 10 point ROS is negative except as outlined in H&P   Physical Exam: General appearance  Well developed, in no distress. Morbidly obese Eyes- non icteric. HEENT nontraumatic, normocephalic. Mouth no lesions, tongue papillated, no cheilosis. Neck supple without adenopathy, thyroid not enlarged, no carotid bruits, no JVD. Lungs Clear to auscultation bilaterally. Cor normal S1, normal S2, regular rhythm, no murmur,  quiet precordium. Abdomen: Morbidly obese with normoactive bowel sounds. Minimal tenderness left lower quadrant. No distention, no rebound. No ascites. Rectal: Not done. Extremities no pedal edema. Skin no lesions. Neurological alert and oriented x 3. Psychological normal mood and affect.  Assessment and Plan:  Problem #6 67 year old white female who had an episode of ischemic colitis most likely secondary to a low flow state. She is on 3 separate antihypertensive medications which might have caused borderline blood pressure combined with mild dehydration. She quickly recovered within several days. A CT angiogram did not show any significant occlusive disease of the mesenteric blood vessels. I had a long discussion with the patient and encouraged her to maintain good hydration and monitor her blood pressure. If her systolic pressure drops below 90, she should hold  her blood pressure medication. Her metoprolol dose has been decreased to 50 mg twice a day. No further evaluation is necessary at this point. She is up-to-date on her colonoscopy.  Problem #1 Irritable bowel syndrome. Patient is to continue a high fiber diet. We will give her Bentyl 20 mg to take one by mouth when necessary for crampy abdominal pain.   04/30/2012 Lina Sar

## 2012-04-30 NOTE — Patient Instructions (Addendum)
We have sent the following medications to your pharmacy for you to pick up at your convenience: Bentyl  CC: Dr Marga Melnick, Dr Dorris Fetch

## 2012-05-24 ENCOUNTER — Other Ambulatory Visit: Payer: Self-pay

## 2012-06-04 ENCOUNTER — Telehealth: Payer: Self-pay | Admitting: Oncology

## 2012-06-04 NOTE — Telephone Encounter (Signed)
left message gv appt d/t. ask pt to confirm message..pt aware letter/cal will b mailed...td

## 2012-06-05 ENCOUNTER — Telehealth: Payer: Self-pay | Admitting: Oncology

## 2012-06-05 NOTE — Telephone Encounter (Signed)
spoke with pt..gv appt d/t..td °

## 2012-06-07 IMAGING — CR DG CHEST 1V PORT
1 series · 1 of 1 positions shown · non-contrast
Comparison: CT 05/03/2010, time 14 26

CLINICAL DATA: Postoperative evaluation.  Thoracic ascending
aneurysm.

PORTABLE CHEST - 1 VIEW

[AP]
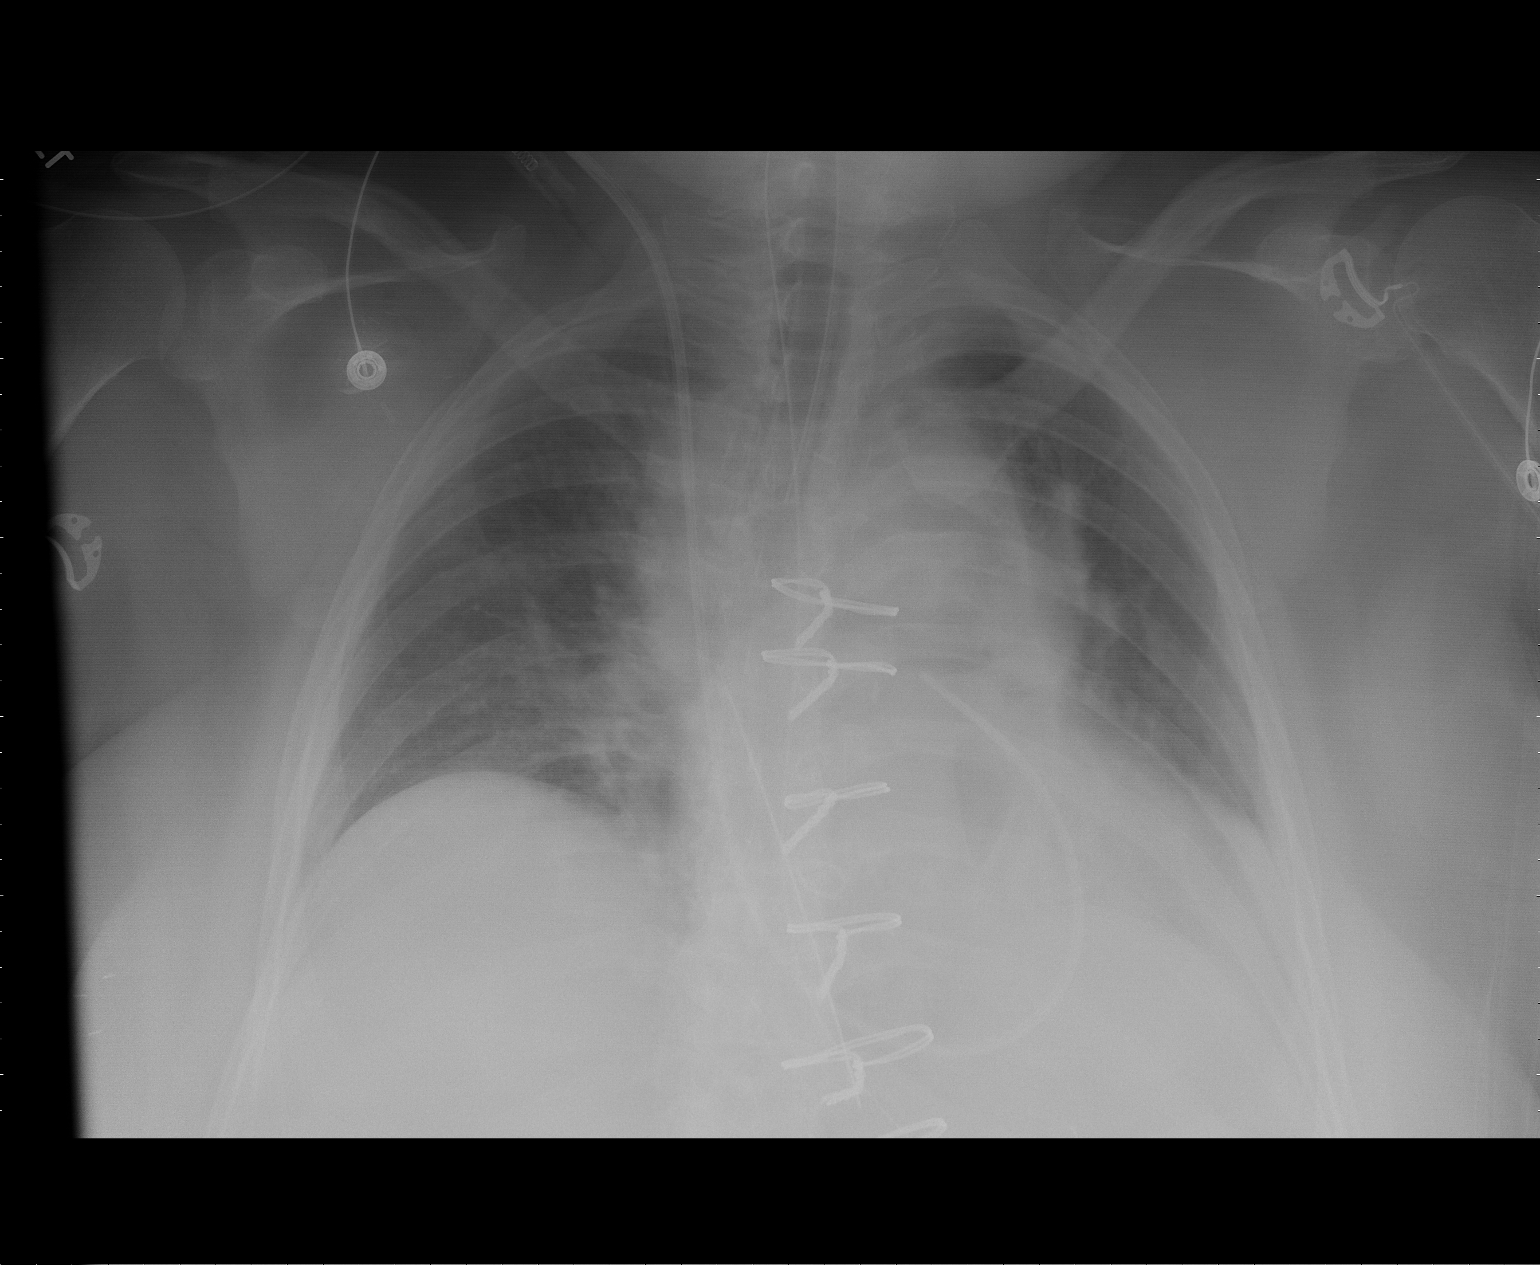

[1 of 1 positions shown; findings below may reference images not displayed]

FINDINGS: An endotracheal tube is in place with the tip
approximately 2.5 cm above the carina.  A Swan-Ganz catheter is in
place via a right IJ approach with the tip in the main pulmonary
outflow tract.  An enteric tube is also in place with the tip
terminating off the inferior aspect of this exam.  Chest tubes are
partially imaged overlying the upper abdomen and left lower
hemithorax.

The lung volumes are low.  Linear atelectasis in the small effusion
is noted at the left lung base.  The heart is within normal limits
in size and contour considering the depth of inspiration.  The
right lung is relatively clear.
IMPRESSION: Support apparatus as detailed above.  Low lung volumes with left
lung base atelectasis.

## 2012-06-07 IMAGING — CR DG CHEST 1V PORT
1 series · 1 of 1 positions shown · non-contrast
Comparison: Chest 02/14/2006.

CLINICAL DATA: Chest pain and vomiting.

PORTABLE CHEST - 1 VIEW

[AP]
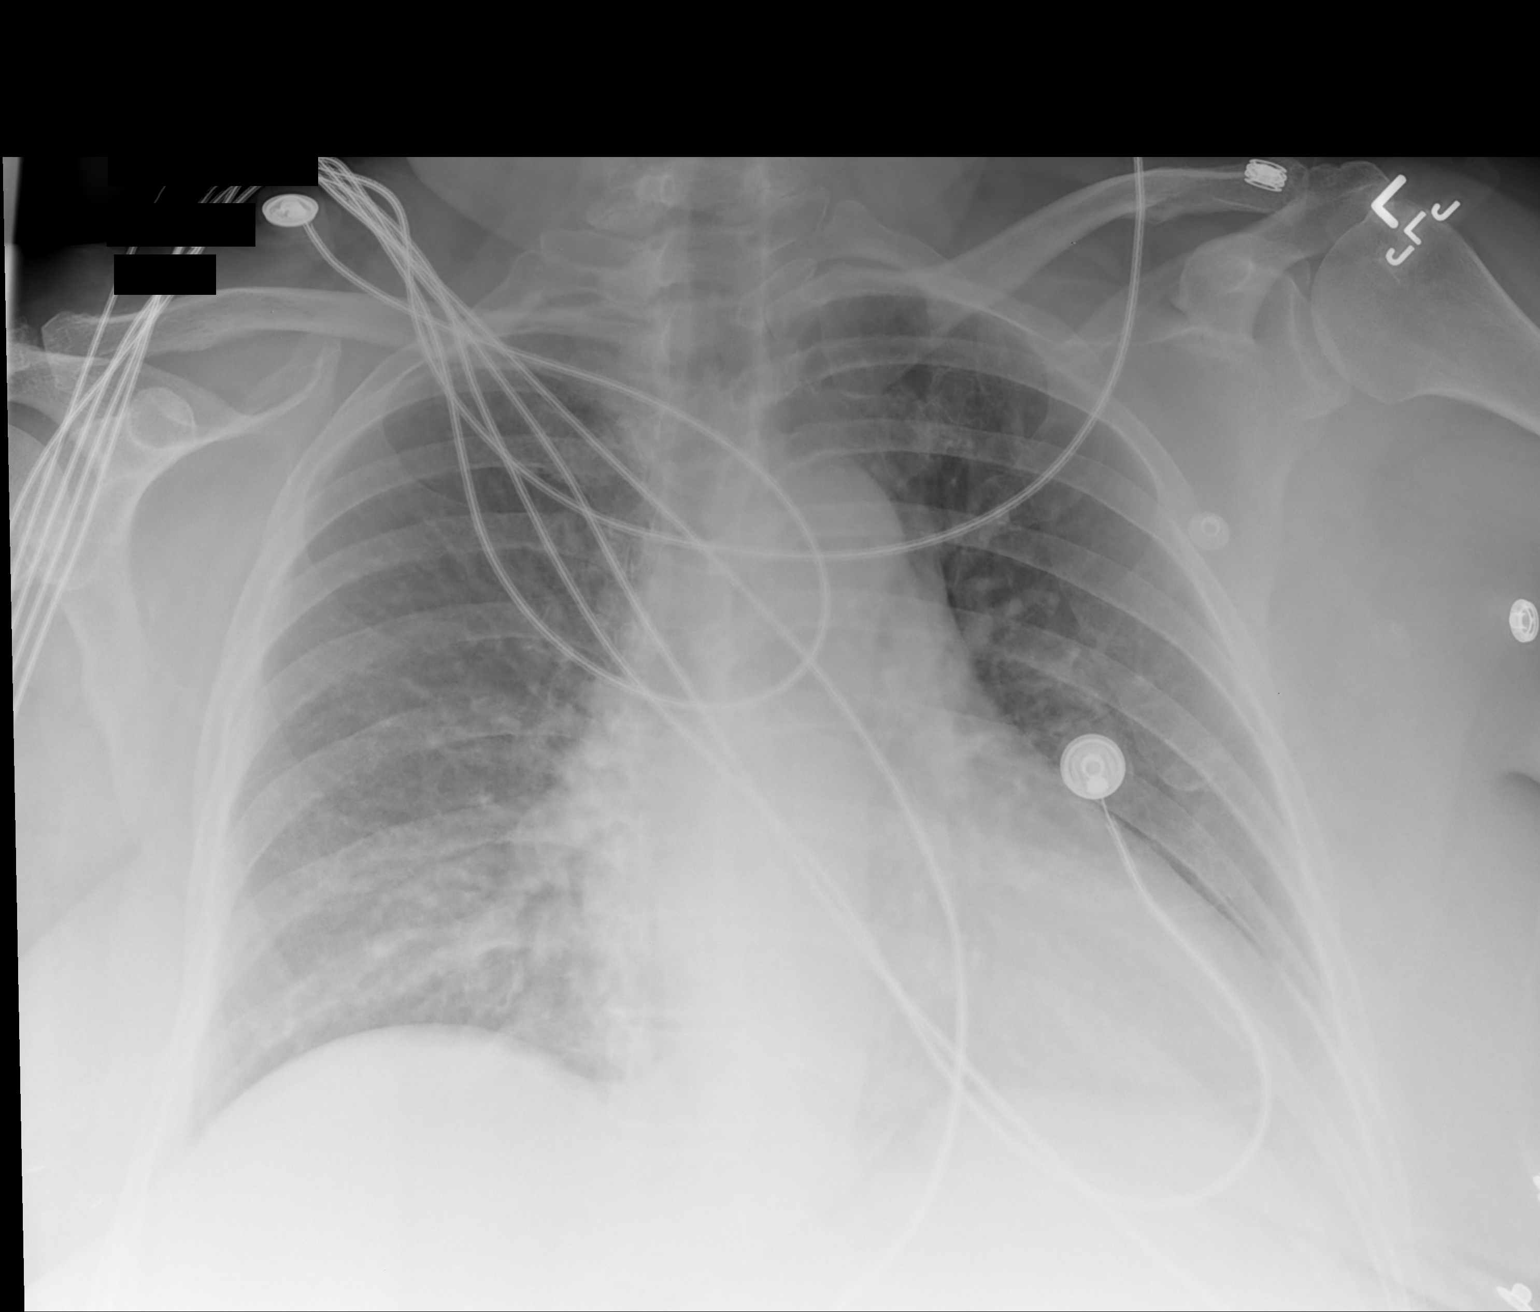

[1 of 1 positions shown; findings below may reference images not displayed]

FINDINGS: Lung volumes are low with some subsegmental basilar
atelectasis.  Heart size is upper normal.  No pneumothorax or
pleural effusion.
IMPRESSION: No acute disease.

## 2012-06-08 IMAGING — CR DG CHEST 1V PORT
1 series · 1 of 1 positions shown · non-contrast
Comparison: 05/03/2010

CLINICAL DATA: Status post CABG

PORTABLE CHEST - 1 VIEW

[AP]
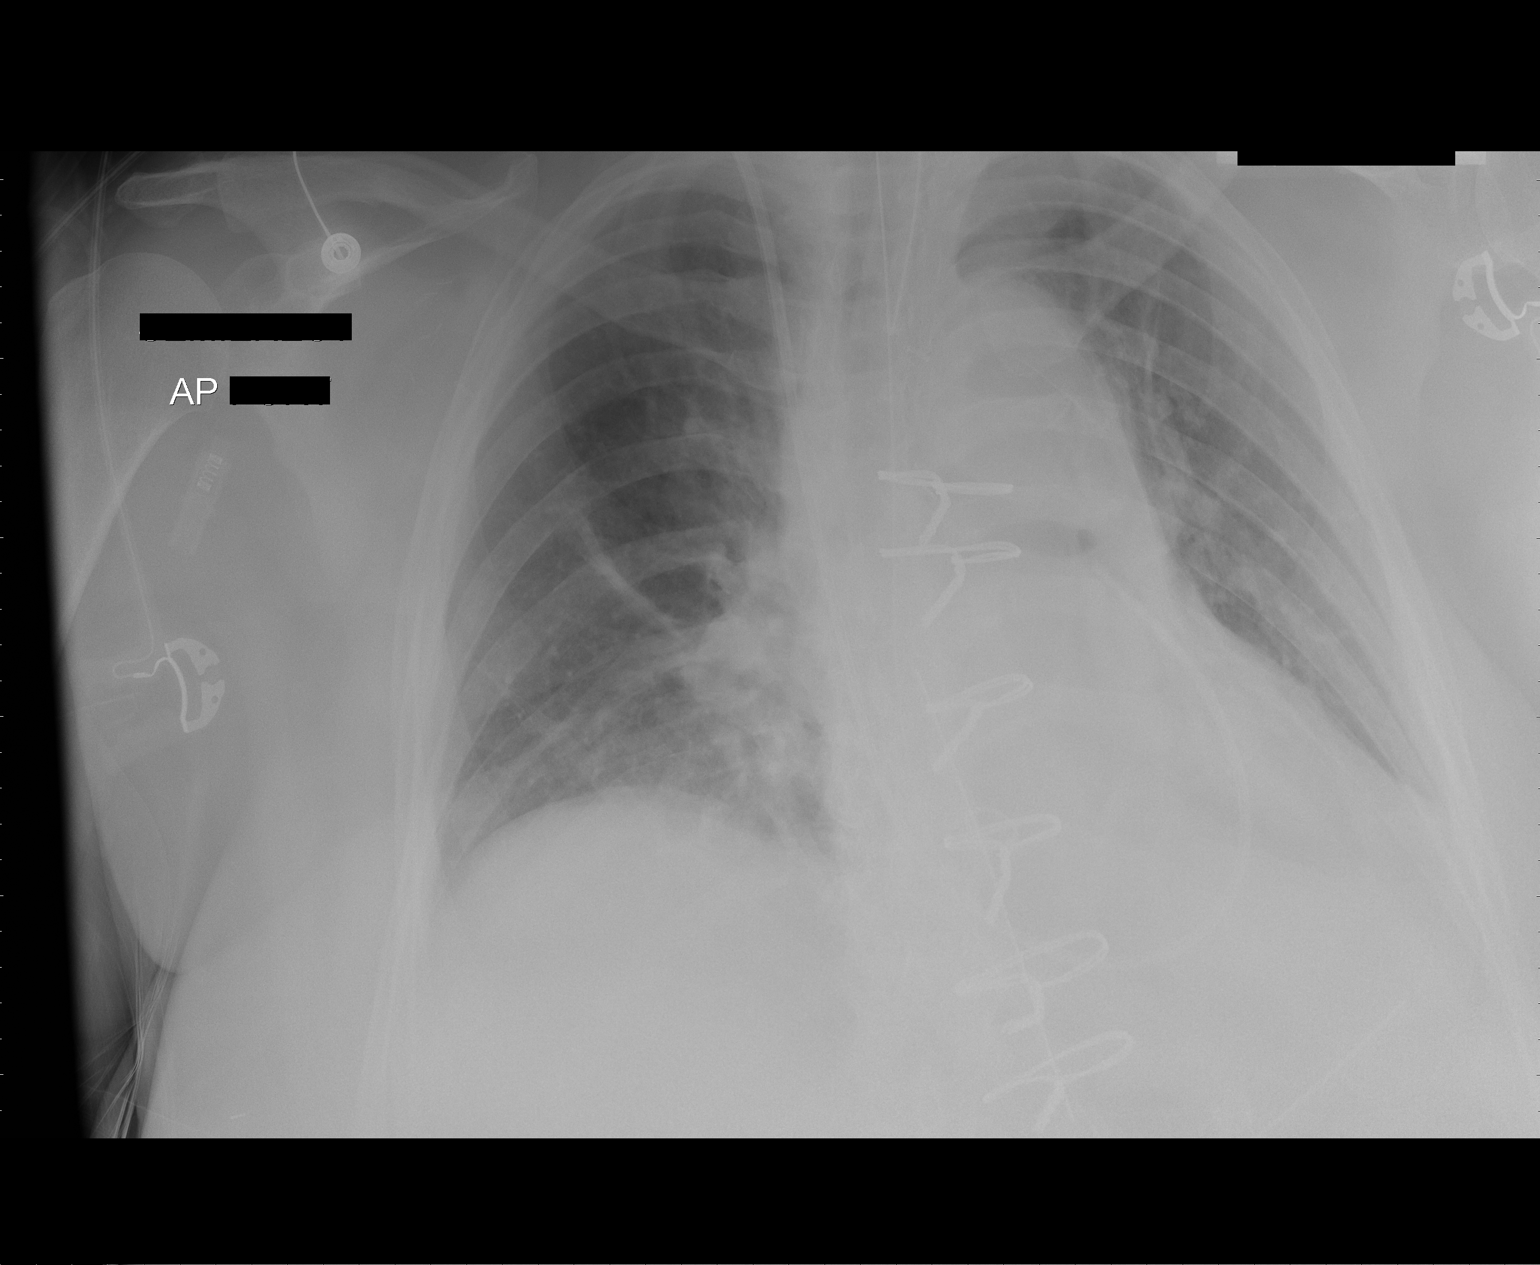

[1 of 1 positions shown; findings below may reference images not displayed]

FINDINGS: Evidence of cardiac surgery again noted.
Endotracheal tube, NG tube and Swan-Ganz catheter and mediastinal
tube are unchanged.
A mildly low volume film with continued bilateral lower lung
atelectasis present.
Mild pulmonary vascular congestion is identified.
No definite pneumothorax is identified.
Little interval change from the prior study noted.
IMPRESSION: Stable chest - no evidence of pneumothorax.

Continued bilateral lower lung atelectasis.

## 2012-06-10 ENCOUNTER — Telehealth: Payer: Self-pay | Admitting: Oncology

## 2012-06-10 NOTE — Telephone Encounter (Signed)
Pt called and MD visit to a Friday per pt rqst

## 2012-06-25 ENCOUNTER — Ambulatory Visit: Payer: Self-pay | Admitting: Cardiovascular Disease

## 2012-06-25 DIAGNOSIS — I4891 Unspecified atrial fibrillation: Secondary | ICD-10-CM | POA: Insufficient documentation

## 2012-06-29 ENCOUNTER — Encounter: Payer: Self-pay | Admitting: *Deleted

## 2012-07-28 ENCOUNTER — Encounter: Payer: Self-pay | Admitting: Cardiovascular Disease

## 2012-08-26 ENCOUNTER — Other Ambulatory Visit: Payer: Self-pay | Admitting: Cardiovascular Disease

## 2012-08-26 NOTE — Telephone Encounter (Signed)
Rx was sent to pharmacy electronically. 

## 2012-08-29 ENCOUNTER — Ambulatory Visit: Payer: Medicare Other | Admitting: Oncology

## 2012-09-10 ENCOUNTER — Ambulatory Visit (INDEPENDENT_AMBULATORY_CARE_PROVIDER_SITE_OTHER): Payer: Medicare Other | Admitting: Pharmacist Clinician (PhC)/ Clinical Pharmacy Specialist

## 2012-09-10 VITALS — BP 142/76 | HR 60

## 2012-09-10 DIAGNOSIS — Z7901 Long term (current) use of anticoagulants: Secondary | ICD-10-CM

## 2012-09-10 DIAGNOSIS — I4891 Unspecified atrial fibrillation: Secondary | ICD-10-CM

## 2012-09-10 LAB — POCT INR: INR: 2

## 2012-09-22 ENCOUNTER — Ambulatory Visit: Payer: Self-pay | Admitting: Oncology

## 2012-10-03 ENCOUNTER — Ambulatory Visit (HOSPITAL_BASED_OUTPATIENT_CLINIC_OR_DEPARTMENT_OTHER): Payer: Medicare Other | Admitting: Lab

## 2012-10-03 ENCOUNTER — Telehealth: Payer: Self-pay | Admitting: Oncology

## 2012-10-03 ENCOUNTER — Ambulatory Visit (HOSPITAL_BASED_OUTPATIENT_CLINIC_OR_DEPARTMENT_OTHER): Payer: Medicare Other | Admitting: Oncology

## 2012-10-03 DIAGNOSIS — Z7901 Long term (current) use of anticoagulants: Secondary | ICD-10-CM

## 2012-10-03 DIAGNOSIS — Z853 Personal history of malignant neoplasm of breast: Secondary | ICD-10-CM

## 2012-10-03 LAB — HEPATIC FUNCTION PANEL
AST: 29 U/L (ref 0–37)
Albumin: 4.1 g/dL (ref 3.5–5.2)
Alkaline Phosphatase: 87 U/L (ref 39–117)
Total Protein: 7.3 g/dL (ref 6.0–8.3)

## 2012-10-03 NOTE — Progress Notes (Signed)
Hematology and Oncology Follow Up Visit  Breanna Hernandez [id number] Jan 08, 1946 67 y.o. 10/03/2012 7:22 PM   Principle Diagnosis: Encounter Diagnoses  Name Primary?  Marland Kitchen BREAST CANCER, HX OF   . Long term (current) use of anticoagulants   . Other hemochromatosis Yes     Interim History:   Followup visit for this pleasant 67 year old woman with a history of metachronous primary bilateral breast cancers. 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 Started on hormonal therapy with Aromasin which she stopped due to excessive weight gain.   Most recent mammogram done 04/18/2012 showed a 4 mm nodular density upper inner quadrant left breast which was not seen on ultrasound. 6 month interval study recommended by the radiologist.  She denies any headache or change in vision, no bone pain, no change in bowel habit, no vaginal bleeding or discharge.  She remains on Coumadin 2 years status post surgery for a type I aortic dissection.   Medications: reviewed  Allergies: No Known Allergies  Review of Systems: Constitutional:   No constitutional symptoms Respiratory: No cough or dyspnea Cardiovascular:  No chest pain or palpitations Gastrointestinal: See above Genito-Urinary: See above Musculoskeletal: See above Neurologic: See above Skin: No rash or ecchymosis Remaining ROS negative.  Physical Exam: Blood pressure 137/64, pulse 63, temperature 98 F (36.7 C), temperature source Oral, resp. rate 18, height 5\' 4"  (1.626 m), weight 243 lb (110.224 kg). Wt Readings from Last 3 Encounters:  10/03/12 243 lb (110.224 kg)  04/30/12 246 lb 6.4 oz (111.766 kg)  03/12/12 249 lb (112.946 kg)     General appearance:  Well-nourished Caucasian woman HENNT: Pharynx no erythema or exudate Lymph nodes: No cervical, supraclavicular, or axillary adenopathy Breasts: Large breasted. Surgical changes left breast. No dominant mass in either breast. Lungs: Clear to auscultation resonant to percussion Heart: Regular rhythm soft 1-2/6 systolic murmur at left sternal border Abdomen: Soft nontender, no mass, no organomegaly Extremities: No edema, no calf tenderness Musculoskeletal: No joint deformities.  GU: Vascular: No cyanosis Neurologic: Motor strength 5 over 5, reflexes absent symmetric at the knees, 1+ symmetric at the biceps Skin: No rash or ecchymosis  Lab Results: Lab Results  Component Value Date   WBC 9.6 03/12/2012   HGB 13.4 03/12/2012   HCT 39.5 03/12/2012   MCV 99.1 03/12/2012   PLT 177.0 03/12/2012     Chemistry      Component Value Date/Time   NA 138 03/12/2012 1221   K 4.2 03/12/2012 1221   CL 101 03/12/2012 1221   CO2 30 03/12/2012 1221   BUN 19 03/12/2012 1221   CREATININE 0.9 03/12/2012 1221   CREATININE 0.97 11/24/2010 0000      Component Value Date/Time   CALCIUM 9.4 03/12/2012 1221   ALKPHOS 62 08/29/2011 1035   AST 22 08/29/2011 1035   ALT 23 08/29/2011 1035   BILITOT 1.0 08/29/2011 1035       Radiological Studies: No results found.  Impression:  Plan: #1. Metachronous primary bilateral invasive and noninvasive breast cancers treated as outlined above. No gross evidence for new disease. Minor finding on January 2000 4T mammogram will be followed up with a 6 month interval left mammogram at this time.   #2. Status post emergency surgery for a type I aortic dissection  05/03/2010  #3. status post previous thyroidectomy for benign disease currently hypothyroid on replacement.  #4. Essential hypertension.  #5. GERD.  #6. 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    CC:.    Levert Feinstein, MD 6/27/20147:22 PM

## 2012-10-03 NOTE — Telephone Encounter (Signed)
Gave pt appt for lab and MD June 2015

## 2012-10-08 ENCOUNTER — Telehealth: Payer: Self-pay | Admitting: *Deleted

## 2012-10-08 NOTE — Telephone Encounter (Signed)
Notified pt of lab results per Dr Cyndie Chime.  She had questions about her bilirubin which she saw was elevated in MyChart & wonders if she should be concerned & if there is any correlation between that & gallbladder problems.  Note to Dr Cyndie Chime.

## 2012-10-08 NOTE — Telephone Encounter (Signed)
Message copied by Sabino Snipes on Wed Oct 08, 2012 12:56 PM ------      Message from: Levert Feinstein      Created: Mon Oct 06, 2012  9:07 AM       Call patient: iron storage protein in normal range - nothing needs to be done ------

## 2012-10-09 ENCOUNTER — Telehealth: Payer: Self-pay | Admitting: Cardiovascular Disease

## 2012-10-09 NOTE — Telephone Encounter (Signed)
Please call-having dizziness,especially when walking! Blood pressure is down-the bottom is 57-114/57-this is low for her!

## 2012-10-09 NOTE — Telephone Encounter (Signed)
Returned call.  Pt stated she has been experiencing some dizzy spells the last 2-3 days.  Stated BP has been 110/57 and 112/57.  Stated it's usually a lot higher than that, both numbers.  Pt stated she took her BP yesterday (described orthostatics) and it was 99/<55 standing.  Pt informed she is likely having some orthostatic hypotension.  Orthostatic precautions given to pt and also advised to increase water intake.  Pt advised to take BP monitor w/ her over the weekend and monitor BP, especially if symptomatic.  Pt verbalized understanding and agreed w/ plan.  Pt will call back on Monday if symptoms worsen for appt or discuss w/ Belenda Cruise at INR check next week.

## 2012-10-15 ENCOUNTER — Ambulatory Visit (INDEPENDENT_AMBULATORY_CARE_PROVIDER_SITE_OTHER): Payer: Medicare Other | Admitting: Pharmacist Clinician (PhC)/ Clinical Pharmacy Specialist

## 2012-10-15 VITALS — BP 142/72 | HR 76

## 2012-10-15 DIAGNOSIS — I4891 Unspecified atrial fibrillation: Secondary | ICD-10-CM

## 2012-10-15 DIAGNOSIS — Z7901 Long term (current) use of anticoagulants: Secondary | ICD-10-CM

## 2012-10-17 ENCOUNTER — Ambulatory Visit
Admission: RE | Admit: 2012-10-17 | Discharge: 2012-10-17 | Disposition: A | Discharge status: Home / Self Care | Payer: Medicare Other | Source: Ambulatory Visit | Attending: Oncology | Admitting: Oncology

## 2012-10-17 ENCOUNTER — Other Ambulatory Visit: Payer: Self-pay | Admitting: Oncology

## 2012-10-17 DIAGNOSIS — Z853 Personal history of malignant neoplasm of breast: Secondary | ICD-10-CM

## 2012-10-20 ENCOUNTER — Other Ambulatory Visit: Payer: Self-pay | Admitting: Oncology

## 2012-10-20 DIAGNOSIS — Z853 Personal history of malignant neoplasm of breast: Secondary | ICD-10-CM

## 2012-10-22 ENCOUNTER — Telehealth: Payer: Self-pay | Admitting: *Deleted

## 2012-10-22 NOTE — Telephone Encounter (Signed)
sw pt gv appt d/t for mammogram for 04/20/12. Pt is aware that i will mail a letter/avs...td

## 2012-10-24 ENCOUNTER — Other Ambulatory Visit: Payer: Self-pay | Admitting: Internal Medicine

## 2012-10-27 NOTE — Telephone Encounter (Signed)
Last OV 07/26/2010, no pending appointment. Hopp please advise (indication given on several refills-appointment due)

## 2012-10-27 NOTE — Telephone Encounter (Signed)
No refill w/o OV follow up NOT seen for > 2 years

## 2012-11-12 ENCOUNTER — Other Ambulatory Visit: Payer: Self-pay

## 2012-11-13 ENCOUNTER — Ambulatory Visit (INDEPENDENT_AMBULATORY_CARE_PROVIDER_SITE_OTHER): Payer: Medicare Other | Admitting: Pharmacist Clinician (PhC)/ Clinical Pharmacy Specialist

## 2012-11-13 DIAGNOSIS — Z7901 Long term (current) use of anticoagulants: Secondary | ICD-10-CM

## 2012-11-13 DIAGNOSIS — I4891 Unspecified atrial fibrillation: Secondary | ICD-10-CM

## 2012-11-18 ENCOUNTER — Other Ambulatory Visit: Payer: Self-pay | Admitting: Cardiovascular Disease

## 2012-11-18 NOTE — Telephone Encounter (Signed)
Rx was sent to pharmacy electronically. 

## 2012-12-04 ENCOUNTER — Ambulatory Visit: Payer: Medicare Other | Admitting: Pharmacist Clinician (PhC)/ Clinical Pharmacy Specialist

## 2012-12-09 ENCOUNTER — Ambulatory Visit (INDEPENDENT_AMBULATORY_CARE_PROVIDER_SITE_OTHER): Payer: Medicare Other | Admitting: Pharmacist Clinician (PhC)/ Clinical Pharmacy Specialist

## 2012-12-09 DIAGNOSIS — Z7901 Long term (current) use of anticoagulants: Secondary | ICD-10-CM

## 2012-12-09 DIAGNOSIS — I4891 Unspecified atrial fibrillation: Secondary | ICD-10-CM

## 2012-12-30 ENCOUNTER — Ambulatory Visit (INDEPENDENT_AMBULATORY_CARE_PROVIDER_SITE_OTHER): Payer: Medicare Other | Admitting: Pharmacist Clinician (PhC)/ Clinical Pharmacy Specialist

## 2012-12-30 VITALS — BP 134/68 | HR 64

## 2012-12-30 DIAGNOSIS — Z7901 Long term (current) use of anticoagulants: Secondary | ICD-10-CM

## 2012-12-30 DIAGNOSIS — I4891 Unspecified atrial fibrillation: Secondary | ICD-10-CM

## 2013-01-20 ENCOUNTER — Ambulatory Visit (INDEPENDENT_AMBULATORY_CARE_PROVIDER_SITE_OTHER): Payer: Medicare Other | Admitting: Pharmacist Clinician (PhC)/ Clinical Pharmacy Specialist

## 2013-01-20 VITALS — BP 136/82 | HR 64

## 2013-01-20 DIAGNOSIS — Z7901 Long term (current) use of anticoagulants: Secondary | ICD-10-CM

## 2013-01-20 DIAGNOSIS — I4891 Unspecified atrial fibrillation: Secondary | ICD-10-CM

## 2013-02-03 ENCOUNTER — Ambulatory Visit: Payer: Medicare Other | Admitting: Pharmacist Clinician (PhC)/ Clinical Pharmacy Specialist

## 2013-02-04 ENCOUNTER — Other Ambulatory Visit: Payer: Self-pay | Admitting: *Deleted

## 2013-02-04 ENCOUNTER — Other Ambulatory Visit: Payer: Self-pay | Admitting: Pharmacist Clinician (PhC)/ Clinical Pharmacy Specialist

## 2013-02-04 MED ORDER — WARFARIN SODIUM 4 MG PO TABS: ORAL_TABLET | ORAL | Status: DC

## 2013-02-04 MED ORDER — AMLODIPINE BESYLATE 5 MG PO TABS: 5.0000 mg | ORAL_TABLET | Freq: Every day | ORAL | Status: DC

## 2013-02-04 NOTE — Telephone Encounter (Signed)
Rx was sent to pharmacy electronically. 

## 2013-02-05 ENCOUNTER — Ambulatory Visit (INDEPENDENT_AMBULATORY_CARE_PROVIDER_SITE_OTHER): Payer: Medicare Other | Admitting: Pharmacist Clinician (PhC)/ Clinical Pharmacy Specialist

## 2013-02-05 VITALS — BP 120/70 | HR 60

## 2013-02-05 DIAGNOSIS — Z7901 Long term (current) use of anticoagulants: Secondary | ICD-10-CM

## 2013-02-05 DIAGNOSIS — I4891 Unspecified atrial fibrillation: Secondary | ICD-10-CM

## 2013-02-05 LAB — POCT INR: INR: 2.5

## 2013-02-12 ENCOUNTER — Other Ambulatory Visit: Payer: Self-pay

## 2013-03-11 ENCOUNTER — Ambulatory Visit (INDEPENDENT_AMBULATORY_CARE_PROVIDER_SITE_OTHER): Payer: Medicare Other | Admitting: Pharmacist Clinician (PhC)/ Clinical Pharmacy Specialist

## 2013-03-11 VITALS — BP 134/70 | HR 64

## 2013-03-11 DIAGNOSIS — Z7901 Long term (current) use of anticoagulants: Secondary | ICD-10-CM

## 2013-03-11 DIAGNOSIS — I4891 Unspecified atrial fibrillation: Secondary | ICD-10-CM

## 2013-03-11 LAB — POCT INR: INR: 2.6

## 2013-03-12 ENCOUNTER — Ambulatory Visit: Payer: Medicare Other | Admitting: Pharmacist Clinician (PhC)/ Clinical Pharmacy Specialist

## 2013-04-22 ENCOUNTER — Ambulatory Visit: Payer: Medicare Other | Admitting: Pharmacist Clinician (PhC)/ Clinical Pharmacy Specialist

## 2013-04-29 ENCOUNTER — Ambulatory Visit (INDEPENDENT_AMBULATORY_CARE_PROVIDER_SITE_OTHER): Payer: Medicare Other | Admitting: Pharmacist Clinician (PhC)/ Clinical Pharmacy Specialist

## 2013-04-29 VITALS — BP 140/72 | HR 76

## 2013-04-29 DIAGNOSIS — I4891 Unspecified atrial fibrillation: Secondary | ICD-10-CM

## 2013-04-29 DIAGNOSIS — Z7901 Long term (current) use of anticoagulants: Secondary | ICD-10-CM

## 2013-04-29 LAB — POCT INR: INR: 2.2

## 2013-06-06 ENCOUNTER — Telehealth: Payer: Self-pay | Admitting: Oncology

## 2013-06-06 ENCOUNTER — Encounter: Payer: Self-pay | Admitting: Oncology

## 2013-06-06 NOTE — Telephone Encounter (Signed)
, °

## 2013-06-09 ENCOUNTER — Other Ambulatory Visit: Payer: Self-pay

## 2013-06-09 DIAGNOSIS — I7100 Dissection of unspecified site of aorta: Secondary | ICD-10-CM

## 2013-06-10 ENCOUNTER — Ambulatory Visit (INDEPENDENT_AMBULATORY_CARE_PROVIDER_SITE_OTHER): Payer: Medicare Other | Admitting: Pharmacist Clinician (PhC)/ Clinical Pharmacy Specialist

## 2013-06-10 VITALS — BP 150/78 | HR 60

## 2013-06-10 DIAGNOSIS — I4891 Unspecified atrial fibrillation: Secondary | ICD-10-CM

## 2013-06-10 DIAGNOSIS — Z7901 Long term (current) use of anticoagulants: Secondary | ICD-10-CM

## 2013-06-10 LAB — POCT INR: INR: 3.9

## 2013-06-22 ENCOUNTER — Other Ambulatory Visit: Payer: Self-pay | Admitting: Thoracic Surgery (Cardiothoracic Vascular Surgery)

## 2013-06-22 LAB — CREATININE, SERUM: Creat: 0.77 mg/dL (ref 0.50–1.10)

## 2013-06-22 LAB — BUN: BUN: 15 mg/dL (ref 6–23)

## 2013-06-23 ENCOUNTER — Ambulatory Visit (INDEPENDENT_AMBULATORY_CARE_PROVIDER_SITE_OTHER): Payer: Medicare Other | Admitting: Thoracic Surgery (Cardiothoracic Vascular Surgery)

## 2013-06-23 ENCOUNTER — Encounter: Payer: Self-pay | Admitting: Thoracic Surgery (Cardiothoracic Vascular Surgery)

## 2013-06-23 ENCOUNTER — Ambulatory Visit
Admission: RE | Admit: 2013-06-23 | Discharge: 2013-06-23 | Disposition: A | Discharge status: Home / Self Care | Payer: Medicare Other | Source: Ambulatory Visit | Attending: Thoracic Surgery (Cardiothoracic Vascular Surgery) | Admitting: Thoracic Surgery (Cardiothoracic Vascular Surgery)

## 2013-06-23 VITALS — BP 142/72 | HR 70 | Resp 16 | Ht 64.0 in | Wt 235.0 lb

## 2013-06-23 DIAGNOSIS — I7100 Dissection of unspecified site of aorta: Secondary | ICD-10-CM

## 2013-06-23 DIAGNOSIS — Z951 Presence of aortocoronary bypass graft: Secondary | ICD-10-CM

## 2013-06-23 DIAGNOSIS — Z09 Encounter for follow-up examination after completed treatment for conditions other than malignant neoplasm: Secondary | ICD-10-CM

## 2013-06-23 MED ORDER — IOHEXOL 350 MG/ML SOLN
80.0000 mL | Freq: Once | INTRAVENOUS | Status: AC | PRN
  Administered 2013-06-23: 80 mL via INTRAVENOUS

## 2013-06-23 NOTE — Progress Notes (Signed)
HPI:  Breanna Hernandez returns today for a scheduled followup visit.  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 I last saw in the office in March of 2013. At that time I recommended an annual CT followup. However she had concerns about radiation exposure and wished to wait 2 years.  She says that she is doing well. She has been on Coumadin for 3 years and wants to stop that. She says that she has not had any atrial fibrillation since the perioperative period for the aortic dissection. She does complain of a burning sensation in the epigastrium with exertion. This is relieved with rest. It is not severe and doesn't happen every time she is exerting herself. She was concerned with whether this could be a sign of additional aortic problems. This is not accompanied by shortness of breath or diaphoresis.   Current Outpatient Prescriptions  Medication Sig Dispense Refill  . amLODipine (NORVASC) 5 MG tablet Take 1 tablet (5 mg total) by mouth daily.  30 tablet  5  . aspirin 81 MG tablet Take 81 mg by mouth daily.        . benazepril (LOTENSIN) 20 MG tablet Take 40 mg by mouth daily.       . Calcium Carb-Cholecalciferol (LIQUID CALCIUM WITH D3) [phone] MG-UNIT CAPS Take by mouth 3 (three) times daily.       . fish oil-omega-3 fatty acids 1000 MG capsule Take by mouth 3 (three) times daily.       . hydrochlorothiazide (MICROZIDE) 12.5 MG capsule TAKE ONE CAPSULE BY MOUTH ONCE DAILY  90 capsule  2  . levothyroxine (SYNTHROID, LEVOTHROID) 75 MCG tablet TAKE ONE TABLET BY MOUTH EVERY DAY **NEED TO CALL FOR AN APPOINTMENT**  30 tablet  0  . LORazepam (ATIVAN) 1 MG tablet 1 by mouth " as needed" and should not be taken on a regular basis. Regular use can increase risk of addiction but more importantly affect level of alertness and balance with increased risk of falling. APPOINTMENT DUE  30 tablet  0  . Magnesium 250 MG TABS Take  250 mg by mouth 2 (two) times daily.      . metoprolol (LOPRESSOR) 50 MG tablet Take 50 mg by mouth 2 (two) times daily.       Marland Kitchen warfarin (COUMADIN) 4 MG tablet Take 1 & 1/2 tablets by mouth daily or as directed  135 tablet  1  . [DISCONTINUED] amLODipine-benazepril (LOTREL) 5-20 MG per capsule Take 1 capsule by mouth daily.        . [DISCONTINUED] escitalopram (LEXAPRO) 10 MG tablet Take 10 mg by mouth daily.        . [DISCONTINUED] lisinopril (PRINIVIL,ZESTRIL) 20 MG tablet Take 20 mg by mouth daily.         No current facility-administered medications for this visit.   Review of systems Burning pain with exertion The leg swelling Constipation Leg cramps Neuropathy with numbness in hands and feet Muscle pain Difficulty walking All other systems are negative  Physical Exam BP 142/72  Pulse 70  Resp 16  Ht 5\' 4"  (1.626 m)  Wt 235 lb (106.595 kg)  BMI 40.32 kg/m2  SpO2 73% Obese 68 year old woman in no acute distress Alert and oriented x3 with no focal motor deficits No carotid bruits Cardiac regular rate and rhythm normal S1 and S2, 2/6 systolic murmur Lungs clear with equal breath sounds Peripheral pulses intact  Diagnostic Tests: CT ANGIOGRAPHY CHEST WITH CONTRAST  TECHNIQUE:  Multidetector CT imaging of the chest was performed using the  standard protocol during bolus administration of intravenous  contrast. Multiplanar CT image reconstructions and MIPs were  obtained to evaluate the vascular anatomy.  CONTRAST: 25mL OMNIPAQUE IOHEXOL 350 MG/ML SOLN  COMPARISON: CT ANGIO CHEST W/CM &/OR WO/CM dated 06/28/2011  FINDINGS:  The thoracic inlet is unremarkable.  Stable postsurgical changes identified within the mediastinum.  Cardiomegaly is once again appreciated also stable. The patient's  aortic graft repair is stable and unchanged. There is no evidence of  mediastinal masses nor adenopathy. Atherosclerotic calcifications  are identified within the coronary arteries.   There is no evidence of filling defects within the main, lobar, or  segmental pulmonary arteries. There is no evidence of a thoracic  aortic aneurysm nor dissection. Mural atherosclerotic calcification  appreciated within the thoracic aorta.  Hypoventilation is appreciated within the lung bases. No focal  regions of consolidation or focal infiltrates are identified.  Arterial phase contrast evaluation of the liver, adrenals, pancreas  is unremarkable. A left renal cyst is once again identified. Kidneys  are otherwise unremarkable. 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 Atherosclerotic calcifications  identified within the abdominal aorta. Visualized portions of the  abdominal aorta demonstrate no evidence of aneurysmal dilatation.  Atherosclerotic calcifications identified within the mesenteric  vessels. Celiac, SMA, IMA are opacified. There is no evidence of  abdominal free fluid, loculated fluid collections, masses, nor  adenopathy.  There is no evidence of aggressive appearing osseous lesions.  Patient status post median sternotomy. Multilevel spondylolysis is  appreciated within the thoracic spine. Stable changes are identified  within the right breast and the area described within the left  breast on previous study it is only partially visualized grossly  appearing unchanged.  Review of the MIP images confirms the above findings.  IMPRESSION:  1. No CT evidence of pulmonary arterial embolic disease, thoracic  aortic aneurysm, nor dissection.  2. Stable postsurgical changes within the mediastinum. The patient  is aortic graft repair appears unchanged.  3. Stable findings within the abdomen and breast.  Electronically Signed  By: Margaree Mackintosh M.D.  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 She is doing well from an aortic standpoint. Her CT scan shows no significant aneurysmal disease. Her descending thoracic aorta is large at a little over 3 cm but not truly aneurysmal. Given her history I do think this bears followup.  She wants to come off of Coumadin. I don't have any objection to that to do think she should be seen by cardiology and possibly have Holter monitoring to make sure that it safe to discontinue that.  She does complain of some burning discomfort with exertion. It's not 100% consistent. However it is suspicious for angina. She was previously followed by Dr. Rex Kras cardiology but now is followed by Dr. Sallyanne Kuster. She has not seen him recently. We're going to help her get an appointment scheduled so that she can discuss both of these issues with him.  Plan: Cardiology consultation  I'll plan to see her back in 2 years with a repeat CT angio of the chest

## 2013-06-25 ENCOUNTER — Other Ambulatory Visit: Payer: Self-pay | Admitting: Cardiovascular Disease

## 2013-07-01 ENCOUNTER — Ambulatory Visit (INDEPENDENT_AMBULATORY_CARE_PROVIDER_SITE_OTHER): Payer: Medicare Other | Admitting: Pharmacist Clinician (PhC)/ Clinical Pharmacy Specialist

## 2013-07-01 DIAGNOSIS — I4891 Unspecified atrial fibrillation: Secondary | ICD-10-CM

## 2013-07-01 DIAGNOSIS — Z7901 Long term (current) use of anticoagulants: Secondary | ICD-10-CM

## 2013-07-01 LAB — POCT INR: INR: 2.6

## 2013-07-03 ENCOUNTER — Telehealth: Payer: Self-pay | Admitting: Oncology

## 2013-07-03 NOTE — Telephone Encounter (Signed)
S/W PATIENT AND GAVE NEW TIME FOR APPT LAB 2:30, MD @ 3 W/DR. SHADAD. NEW CALENDAR MAILED.

## 2013-07-19 ENCOUNTER — Other Ambulatory Visit: Payer: Self-pay | Admitting: Cardiovascular Disease

## 2013-07-20 NOTE — Telephone Encounter (Signed)
Rx was sent to pharmacy electronically. 

## 2013-07-21 ENCOUNTER — Other Ambulatory Visit: Payer: Self-pay | Admitting: *Deleted

## 2013-07-21 ENCOUNTER — Telehealth: Payer: Self-pay | Admitting: Hematology and Oncology

## 2013-07-21 MED ORDER — METOPROLOL TARTRATE 50 MG PO TABS: 50.0000 mg | ORAL_TABLET | Freq: Two times a day (BID) | ORAL | Status: DC

## 2013-07-21 NOTE — Telephone Encounter (Signed)
Recieved message from Amy Merry Proud H) that pt is requesting a female provider. pt appts for lb/FS on 6/26 moved to lb/NG 6/26 @ 1pm. new schedule provided. Amy will mailed schedule and contact pt.

## 2013-07-21 NOTE — Telephone Encounter (Signed)
Rx refill sent to patient pharmacy   

## 2013-08-03 ENCOUNTER — Ambulatory Visit (INDEPENDENT_AMBULATORY_CARE_PROVIDER_SITE_OTHER): Payer: Medicare Other | Admitting: Cardiovascular Disease

## 2013-08-03 ENCOUNTER — Encounter: Payer: Self-pay | Admitting: Cardiovascular Disease

## 2013-08-03 ENCOUNTER — Ambulatory Visit (INDEPENDENT_AMBULATORY_CARE_PROVIDER_SITE_OTHER): Payer: Medicare Other | Admitting: Pharmacist Clinician (PhC)/ Clinical Pharmacy Specialist

## 2013-08-03 VITALS — BP 132/64 | HR 58 | Resp 16 | Ht 65.0 in | Wt 254.4 lb

## 2013-08-03 DIAGNOSIS — I4891 Unspecified atrial fibrillation: Secondary | ICD-10-CM

## 2013-08-03 DIAGNOSIS — R079 Chest pain, unspecified: Secondary | ICD-10-CM

## 2013-08-03 DIAGNOSIS — Z7901 Long term (current) use of anticoagulants: Secondary | ICD-10-CM

## 2013-08-03 LAB — POCT INR: INR: 3.7

## 2013-08-03 NOTE — Progress Notes (Signed)
Patient ID: Breanna Hernandez, female   DOB: Aug 11, 1945, 67 y.o.   MRN: [id number]      Reason for office visit History of type I aortic dissection, coronary atherosclerosis, perioperative atrial fibrillation, severe hypercholesterolemia   This is a routine yearly visit for [facility] and she has not had any new recent complaints. Her biggest problem is lower extremity neuropathy which greatly limits her ability to be active. She denies  dyspnea, dizziness, syncope or palpitations, at least at the level of physical activity that she can achieve. As we talked more about her physical ability, it became apparent that she occasionally has an epigastric burning sensation when she tries to hurry. The symptoms routinely resolved after just a minute of rest.   She recently saw Dr. Roxan Hockey and the CT of her aorta shows a stable appearance without any evidence of new aortic enlargement. Atherosclerotic calcifications were described in the coronary arteries, as well as in the abdominal aorta and in the mesenteric vessels.  In January of 2012 she presented with a type I aortic dissection and underwent emergency surgery. She had no previous history of coronary problems but at the time of surgery was noticed to have a very large plaque in the proximal right coronary artery by direct inspection. Dr. Roxan Hockey placed a saphenous vein graft bypass to the RCA. The aortic repair was otherwise uneventful. Her hospital recovery was briefly complicated by postoperative atrial fibrillation. She wore an event monitor last year that did not show any evidence of recurrent atrial fibrillation over a period of 30 days. She continues to take warfarin and has not had serious bleeding complications. She would like to stop this. Despite severe hypercholesterolemia(LDL>190) she has refused statins in the past. She had a normal nuclear perfusion study in 2011, before she underwent her dissection surgery.    No Known  Allergies  Current Outpatient Prescriptions  Medication Sig Dispense Refill  . aspirin 81 MG tablet Take 81 mg by mouth daily.        . benazepril (LOTENSIN) 20 MG tablet Take 40 mg by mouth daily.       . Calcium Carb-Cholecalciferol (LIQUID CALCIUM WITH D3) [id number] MG-UNIT CAPS Take by mouth 3 (three) times daily.       . fish oil-omega-3 fatty acids 1000 MG capsule Take by mouth 3 (three) times daily.       . hydrochlorothiazide (MICROZIDE) 12.5 MG capsule TAKE ONE CAPSULE BY MOUTH ONCE DAILY  90 capsule  2  . levothyroxine (SYNTHROID, LEVOTHROID) 75 MCG tablet TAKE ONE TABLET BY MOUTH ONCE DAILY  30 tablet  0  . LORazepam (ATIVAN) 1 MG tablet 1 by mouth " as needed" and should not be taken on a regular basis. Regular use can increase risk of addiction but more importantly affect level of alertness and balance with increased risk of falling. APPOINTMENT DUE  30 tablet  0  . Magnesium 250 MG TABS Take 250 mg by mouth 2 (two) times daily.      . metoprolol (LOPRESSOR) 50 MG tablet Take 1 tablet (50 mg total) by mouth 2 (two) times daily.  180 tablet  0  . warfarin (COUMADIN) 4 MG tablet Take 1 & 1/2 tablets by mouth daily or as directed  135 tablet  1  . amLODipine (NORVASC) 5 MG tablet TAKE ONE TABLET BY MOUTH ONCE DAILY  30 tablet  11  . [DISCONTINUED] amLODipine-benazepril (LOTREL) 5-20 MG per capsule Take 1 capsule by mouth daily.        . [  DISCONTINUED] escitalopram (LEXAPRO) 10 MG tablet Take 10 mg by mouth daily.        . [DISCONTINUED] lisinopril (PRINIVIL,ZESTRIL) 20 MG tablet Take 20 mg by mouth daily.         No current facility-administered medications for this visit.    Past Medical History  Diagnosis Date  . HTN (hypertension)   . Hypothyroidism   . Breast cancer     x 3  . 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 Diverticulosis   . Hyperplastic colon polyp   . Hyperlipidemia   . Obesity   . IBS (irritable bowel syndrome)   . Atrial fibrillation     h/o  post-op 04/2010  . Renal cyst, left   . Fatty liver 01/10/07  . Lower extremity neuropathy     Past Surgical History  Procedure Laterality Date  . Cholecystectomy    . Tonsillectomy    . Colonoscopy w/ polypectomy    . Breast lumpectomy  2000    Right x2, Left x1  . Median sternotomy, extracorporeal circulation, repair of type 1 aortic dissection with  tube graft from  sinotubular junction to take off of innominate artery, distal anastomoses under deep bypothermic circulatory arrest  05/03/2010    Dr Roxan Hockey  . Cabg x 1  05/03/2010    RCA -Dr Roxan Hockey (with type 1 aortic dissection)    Family History  Problem Relation Age of Onset  . Aneurysm Father     CNS  . Heart attack Father     x2  . Alzheimer's disease Mother   . Hypertension Mother   . Hypertension Brother   . Goiter Mother   . Colon cancer Neg Hx     History   Social History  . Marital Status: Married    Spouse Name: N/A    Number of Children: N/A  . Years of Education: N/A   Occupational History  . Not on file.   Social History Main Topics  . Smoking status: Never Smoker   . Smokeless tobacco: Never Used  . Alcohol Use: No  . Drug Use: No  . Sexual Activity: Not on file   Other Topics Concern  . Not on file   Social History Narrative  . No narrative on file    Review of systems: The patient specifically denies any chest pain at rest, dyspnea at rest or with exertion, orthopnea, paroxysmal nocturnal dyspnea, syncope, palpitations, focal neurological deficits, intermittent claudication, lower extremity edema, unexplained weight gain, cough, hemoptysis or wheezing.  The patient also denies abdominal pain, nausea, vomiting, dysphagia, diarrhea, constipation, polyuria, polydipsia, dysuria, hematuria, frequency, urgency, abnormal bleeding or bruising, fever, chills, unexpected weight changes, mood swings, change in skin or hair texture, change in voice quality, auditory or visual problems, allergic  reactions or rashes, new musculoskeletal complaints other than usual "aches and pains".   PHYSICAL EXAM BP 132/64  Pulse 58  Resp 16  Ht $R'5\' 5"'Ls$  (1.651 m)  Wt 254 lb 6.4 oz (115.395 kg)  BMI 42.33 kg/m2  General: Alert, oriented x3, no distress, morbidly obese Head: no evidence of trauma, PERRL, EOMI, no exophtalmos or lid lag, no myxedema, no xanthelasma; normal ears, nose and oropharynx Neck: Cannot identify any jugular venous pulsations and no hepatojugular reflux; brisk carotid pulses without delay and no carotid bruits Chest: clear to auscultation, no signs of consolidation by percussion or palpation, normal fremitus, symmetrical and full respiratory excursions Cardiovascular: Unable to locate the apical impulse,  regular rhythm, normal first and second heart sounds, no murmurs, rubs or gallops Abdomen: no tenderness or distention, no masses by palpation, no abnormal pulsatility or arterial bruits, normal bowel sounds, no hepatosplenomegaly Extremities: no clubbing, cyanosis or edema; 2+ radial, ulnar and brachial pulses bilaterally; 2+ right femoral, posterior tibial and dorsalis pedis pulses; 2+ left femoral, posterior tibial and dorsalis pedis pulses; no subclavian or femoral bruits Neurological: grossly nonfocal   EKG: Sinus bradycardia, nonspecific T wave changes in the lateral leads (1, 2, aVL, V5 and V6). QTC 428 ms  Lipid Panel     Component Value Date/Time   CHOL 255* 03/29/2006 1033   HDL 34.5* 03/29/2006 1033   VLDL 59* 03/29/2006 1033    BMET    Component Value Date/Time   NA 138 03/12/2012 1221   K 4.2 03/12/2012 1221   CL 101 03/12/2012 1221   CO2 30 03/12/2012 1221   GLUCOSE 101* 03/12/2012 1221   BUN 15 06/22/2013 1149   CREATININE 0.77 06/22/2013 1149   CREATININE 0.9 03/12/2012 1221   CALCIUM 9.4 03/12/2012 1221   GFRNONAA >60 05/17/2010 0516   GFRAA  Value: >60        The eGFR has been calculated using the MDRD equation. This calculation has not been validated in  all clinical situations. eGFR's persistently <60 mL/min signify possible Chronic Kidney Disease. 05/17/2010 0516     ASSESSMENT AND PLAN  Breanna Hernandez symptoms are worrisome for exertional angina pectoris, which is a new development. She has known coronary atherosclerosis documented both by direct visual inspection at the time of surgery for aortic dissection and on the CT angiogram of the aorta. She has severe and untreated hyperlipidemia. There are no high-risk features either on the clinical history or on her electrocardiogram. We'll start with a nuclear perfusion study, especially since we have a Holter study to compare to. Otherwise he'll be quite worried about the risk of "false positive" findings speak to her body habitus. She does have a history of gastroesophageal reflux disease that dates back as far as 2008. This could provide an alternative explanation for his symptoms.  Regardless of the findings of the nuclear stress test I think it is imperative that she take statins for her severe hyperlipidemia. She remains skeptical.  As far as the atrial fibrillation is concerned, we have had no documentation of this problem in 3 years including a 30 day event monitor. I think it is reasonable that she stop taking warfarin. We'll discuss this at her followup visit after the stress test.  Patient Instructions  Your physician has requested that you have a lexiscan myoview. For further information please visit [email or web address]. Please follow instruction sheet, as given.  We will call you with the results.  Dr. Sallyanne Kuster recommends that you schedule a follow-up appointment in: One year.         Orders Placed This Encounter  Procedures  . Myocardial Perfusion Imaging  . EKG 12-Lead   No orders of the defined types were placed in this encounter.    Jorgen Wolfinger  Sanda Klein, MD, [facility] [phone] office [phone] pager

## 2013-08-03 NOTE — Patient Instructions (Signed)
Your physician has requested that you have a lexiscan myoview. For further information please visit [email or web address]. Please follow instruction sheet, as given.  We will call you with the results.  Dr. Croitoru recommends that you schedule a follow-up appointment in: One year   

## 2013-08-04 ENCOUNTER — Other Ambulatory Visit: Payer: Self-pay | Admitting: Hematology and Oncology

## 2013-08-04 DIAGNOSIS — Z853 Personal history of malignant neoplasm of breast: Secondary | ICD-10-CM

## 2013-08-07 ENCOUNTER — Other Ambulatory Visit: Payer: Self-pay | Admitting: Cardiovascular Disease

## 2013-08-07 ENCOUNTER — Telehealth (HOSPITAL_COMMUNITY): Payer: Self-pay

## 2013-08-07 NOTE — Telephone Encounter (Signed)
Rx was sent to pharmacy electronically. 

## 2013-08-09 DIAGNOSIS — R079 Chest pain, unspecified: Secondary | ICD-10-CM | POA: Insufficient documentation

## 2013-08-11 ENCOUNTER — Telehealth: Payer: Self-pay | Admitting: Cardiovascular Disease

## 2013-08-11 ENCOUNTER — Other Ambulatory Visit: Payer: Self-pay

## 2013-08-11 MED ORDER — BENAZEPRIL HCL 40 MG PO TABS
40.0000 mg | ORAL_TABLET | Freq: Every day | ORAL | Status: DC
Start: 2013-08-11 — End: 2014-05-16

## 2013-08-11 NOTE — Telephone Encounter (Signed)
Her medical dr retired and she needs to find out if Dr C would write her rx for Lorazepham 1mg  until she gets new dr.  Please call

## 2013-08-11 NOTE — Telephone Encounter (Signed)
Refill lorazepam 1mg  prn #30 0/refills called to pharmacy.

## 2013-08-11 NOTE — Telephone Encounter (Signed)
Rx was sent to pharmacy electronically. 

## 2013-08-11 NOTE — Telephone Encounter (Signed)
Refill lorazepam 1mg  prn #30 0/refills called to pharmacy.  Patient notified future refills through new PCP.

## 2013-08-11 NOTE — Telephone Encounter (Signed)
I can do a 30 tablets prescription, no refills

## 2013-08-11 NOTE — Telephone Encounter (Signed)
Message forwarded to Dr. Croitoru/Barbara, CMA 

## 2013-08-12 ENCOUNTER — Ambulatory Visit (HOSPITAL_COMMUNITY)
Admission: RE | Admit: 2013-08-12 | Discharge: 2013-08-12 | Disposition: A | Discharge status: Home / Self Care | Payer: Medicare Other | Source: Ambulatory Visit | Attending: Cardiovascular Disease | Admitting: Cardiovascular Disease

## 2013-08-12 DIAGNOSIS — E669 Obesity, unspecified: Secondary | ICD-10-CM | POA: Insufficient documentation

## 2013-08-12 DIAGNOSIS — I4891 Unspecified atrial fibrillation: Secondary | ICD-10-CM | POA: Insufficient documentation

## 2013-08-12 DIAGNOSIS — R0989 Other specified symptoms and signs involving the circulatory and respiratory systems: Secondary | ICD-10-CM | POA: Insufficient documentation

## 2013-08-12 DIAGNOSIS — R002 Palpitations: Secondary | ICD-10-CM | POA: Insufficient documentation

## 2013-08-12 DIAGNOSIS — Z8249 Family history of ischemic heart disease and other diseases of the circulatory system: Secondary | ICD-10-CM | POA: Insufficient documentation

## 2013-08-12 DIAGNOSIS — I10 Essential (primary) hypertension: Secondary | ICD-10-CM | POA: Insufficient documentation

## 2013-08-12 DIAGNOSIS — R079 Chest pain, unspecified: Secondary | ICD-10-CM | POA: Insufficient documentation

## 2013-08-12 DIAGNOSIS — R0609 Other forms of dyspnea: Secondary | ICD-10-CM | POA: Insufficient documentation

## 2013-08-12 MED ORDER — TECHNETIUM TC 99M SESTAMIBI GENERIC - CARDIOLITE
10.8000 | Freq: Once | INTRAVENOUS | Status: AC | PRN
  Administered 2013-08-12: 11 via INTRAVENOUS

## 2013-08-12 MED ORDER — AMINOPHYLLINE 25 MG/ML IV SOLN
75.0000 mg | Freq: Once | INTRAVENOUS | Status: AC
  Administered 2013-08-12: 75 mg via INTRAVENOUS

## 2013-08-12 MED ORDER — REGADENOSON 0.4 MG/5ML IV SOLN
0.4000 mg | Freq: Once | INTRAVENOUS | Status: AC
  Administered 2013-08-12: 0.4 mg via INTRAVENOUS

## 2013-08-12 MED ORDER — TECHNETIUM TC 99M SESTAMIBI GENERIC - CARDIOLITE
31.6000 | Freq: Once | INTRAVENOUS | Status: AC | PRN
  Administered 2013-08-12: 32 via INTRAVENOUS

## 2013-08-12 NOTE — Procedures (Addendum)
[name] [address] [name] [address] [phone]  Cardiology Nuclear Med Study  Breanna Hernandez is a 68 y.o. female     MRN : [id number]     DOB: 03/20/1946  Procedure Date: 08/12/2013  Nuclear Med Background Indication for Stress Test:  Evaluation for Ischemia History:  PAF;AORTIC DISECTION-2012;Last NUC MPI on 06/16/2009-nonischemic;EF=76% Cardiac Risk Factors: Family History - CAD, Hypertension, Lipids, Obesity and metabolic syndrome  Symptoms:  Chest Pain, DOE and Palpitations   Nuclear Pre-Procedure Caffeine/Decaff Intake:  7:00pm NPO After: 5:00am   IV Site: R Hand  IV 0.9% NS with Angio Cath:  22g  Chest Size (in):  n/a IV Started by: Rolene Course, RN  Height: 5\' 5"  (1.651 m)  Cup Size: D-Pt has had lumpectomy on right breast x2;lumpectomy on left breast x1;unknown if titanium markers   BMI:  Body mass index is 42.27 kg/(m^2). Weight:  254 lb (115.214 kg)   Tech Comments:  n/a    Nuclear Med Study 1 or 2 day study: 1 day  Stress Test Type:  [place] Provider:  Sanda Klein, MD   Resting Radionuclide: Technetium 85m Sestamibi  Resting Radionuclide Dose: 10.8 mCi   Stress Radionuclide:  Technetium 44m Sestamibi  Stress Radionuclide Dose: 31.6 mCi           Stress Protocol Rest HR: 69 Stress HR: 93  Rest BP: 128/80 Stress BP: 148/66  Exercise Time (min): n/a METS: n/a          Dose of Adenosine (mg):  n/a Dose of Lexiscan: 0.4 mg  Dose of Atropine (mg): n/a Dose of Dobutamine: n/a mcg/kg/min (at max HR)  Stress Test Technologist: Mellody Memos, CCT Nuclear Technologist: Imagene Riches, CNMT   Rest Procedure:  Myocardial perfusion imaging was performed at rest 45 minutes following the intravenous administration of Technetium 67m Sestamibi. Stress Procedure:  The patient received IV Lexiscan 0.4 mg over 15-seconds.  Technetium 8m Sestamibi injected Iv at 30-seconds. Patient  experienced shortness of breath, head pain and was administered 75 mg of Aminophylline IV at 5 minutes. There were no significant changes with Lexiscan.  Quantitative spect images were obtained after a 45 minute delay.  Transient Ischemic Dilatation (Normal <1.22):  0.82 Lung/Heart Ratio (Normal <0.45):  0.25 QGS EDV:  53 ml QGS ESV:  12 ml LV Ejection Fraction: 76%     Rest ECG: NSR with non-specific ST-T wave changes  Stress ECG: No significant ST segment change suggestive of ischemia.  QPS Raw Data Images:  Mild breast attenuation.  Normal left ventricular size. Stress Images:  Normal homogeneous uptake in all areas of the myocardium. Rest Images:  Normal homogeneous uptake in all areas of the myocardium. Subtraction (SDS):  No evidence of ischemia. LV Wall Motion:  NL LV Function; NL Wall Motion  Impression Exercise Capacity:  Lexiscan with no exercise. BP Response:  Normal blood pressure response. Clinical Symptoms:  No significant symptoms noted. ECG Impression:  No significant ECG changes with Lexiscan. Comparison with Prior Nuclear Study: No significant change from previous study   Overall Impression:  Low risk stress nuclear study with breast attenuation artifact.   Sanda Klein, MD  08/12/2013 12:59 PM

## 2013-08-14 ENCOUNTER — Ambulatory Visit
Admission: RE | Admit: 2013-08-14 | Discharge: 2013-08-14 | Disposition: A | Discharge status: Home / Self Care | Payer: Medicare Other | Source: Ambulatory Visit | Attending: Hematology and Oncology | Admitting: Hematology and Oncology

## 2013-08-14 ENCOUNTER — Other Ambulatory Visit: Payer: Self-pay | Admitting: Hematology and Oncology

## 2013-08-14 DIAGNOSIS — Z853 Personal history of malignant neoplasm of breast: Secondary | ICD-10-CM

## 2013-08-22 ENCOUNTER — Other Ambulatory Visit: Payer: Self-pay | Admitting: Cardiovascular Disease

## 2013-08-22 ENCOUNTER — Other Ambulatory Visit: Payer: Self-pay | Admitting: Pharmacist Clinician (PhC)/ Clinical Pharmacy Specialist

## 2013-08-25 ENCOUNTER — Ambulatory Visit (INDEPENDENT_AMBULATORY_CARE_PROVIDER_SITE_OTHER): Payer: Medicare Other | Admitting: Pharmacist Clinician (PhC)/ Clinical Pharmacy Specialist

## 2013-08-25 DIAGNOSIS — Z7901 Long term (current) use of anticoagulants: Secondary | ICD-10-CM

## 2013-08-25 DIAGNOSIS — I4891 Unspecified atrial fibrillation: Secondary | ICD-10-CM

## 2013-08-25 LAB — POCT INR: INR: 2.6

## 2013-08-25 NOTE — Telephone Encounter (Signed)
Rx was sent to pharmacy electronically. Refill for synthroid refused - defer to PCP

## 2013-08-28 ENCOUNTER — Other Ambulatory Visit: Payer: Self-pay | Admitting: Cardiovascular Disease

## 2013-08-29 NOTE — Telephone Encounter (Signed)
Refill for synthroid refused - defer to PCP

## 2013-08-31 ENCOUNTER — Telehealth: Payer: Self-pay | Admitting: Cardiology

## 2013-08-31 NOTE — Telephone Encounter (Signed)
Patient called answering service with medication refill request. She is followed by Dr. Sallyanne Kuster. She is requesting a refill for levothyroxine. However, I attempted to return phone call several times at the number provided (which was cell / mobile phone number) but did not get an answer. Also, the voicemail box was full and could not accept any messages. I then attempted to reach patient at her home phone number listed in chart. I left a message for her to return our phone call. Answering service also notified of the above.

## 2013-09-01 ENCOUNTER — Telehealth: Payer: Self-pay | Admitting: Cardiovascular Disease

## 2013-09-01 ENCOUNTER — Other Ambulatory Visit: Payer: Self-pay | Admitting: Cardiovascular Disease

## 2013-09-01 MED ORDER — LEVOTHYROXINE SODIUM 75 MCG PO TABS: 75.0000 ug | ORAL_TABLET | Freq: Every day | ORAL | Status: DC

## 2013-09-01 NOTE — Telephone Encounter (Signed)
Would like to know if we can refill levothyroxine 75mg  , because her primary has retired and she has not gotten a new one yet. Please Call.. Thanks

## 2013-09-01 NOTE — Telephone Encounter (Signed)
Pt says she need her medicine today,Her primary have retired and she does not have a new one yet.Please call this in if you can.

## 2013-09-01 NOTE — Telephone Encounter (Signed)
Rx was sent to pharmacy electronically. 

## 2013-09-01 NOTE — Telephone Encounter (Signed)
Rx was sent to pharmacy electronically. Patient was notified by RN that she will need to find a new PCP for future refills/to monitor her hypothyroidism/labs

## 2013-09-22 ENCOUNTER — Encounter: Payer: Self-pay | Admitting: Internal Medicine

## 2013-09-22 ENCOUNTER — Other Ambulatory Visit (INDEPENDENT_AMBULATORY_CARE_PROVIDER_SITE_OTHER): Payer: Medicare Other

## 2013-09-22 ENCOUNTER — Ambulatory Visit (INDEPENDENT_AMBULATORY_CARE_PROVIDER_SITE_OTHER): Payer: Medicare Other | Admitting: Internal Medicine

## 2013-09-22 VITALS — BP 130/72 | HR 69 | Temp 98.0°F | Wt 246.0 lb

## 2013-09-22 DIAGNOSIS — E8881 Metabolic syndrome: Secondary | ICD-10-CM

## 2013-09-22 DIAGNOSIS — Z86010 Personal history of colonic polyps: Secondary | ICD-10-CM

## 2013-09-22 DIAGNOSIS — E039 Hypothyroidism, unspecified: Secondary | ICD-10-CM

## 2013-09-22 DIAGNOSIS — Z862 Personal history of diseases of the blood and blood-forming organs and certain disorders involving the immune mechanism: Secondary | ICD-10-CM

## 2013-09-22 DIAGNOSIS — R7402 Elevation of levels of lactic acid dehydrogenase (LDH): Secondary | ICD-10-CM

## 2013-09-22 DIAGNOSIS — I10 Essential (primary) hypertension: Secondary | ICD-10-CM

## 2013-09-22 DIAGNOSIS — R740 Nonspecific elevation of levels of transaminase and lactic acid dehydrogenase [LDH]: Secondary | ICD-10-CM

## 2013-09-22 DIAGNOSIS — R7401 Elevation of levels of liver transaminase levels: Secondary | ICD-10-CM

## 2013-09-22 DIAGNOSIS — Z8639 Personal history of other endocrine, nutritional and metabolic disease: Secondary | ICD-10-CM

## 2013-09-22 LAB — HEMOGLOBIN A1C: Hgb A1c MFr Bld: 5.8 % (ref 4.6–6.5)

## 2013-09-22 LAB — HEPATIC FUNCTION PANEL
ALBUMIN: 4 g/dL (ref 3.5–5.2)
ALT: 31 U/L (ref 0–35)
AST: 31 U/L (ref 0–37)
Alkaline Phosphatase: 80 U/L (ref 39–117)
BILIRUBIN DIRECT: 0.1 mg/dL (ref 0.0–0.3)
TOTAL PROTEIN: 7.7 g/dL (ref 6.0–8.3)
Total Bilirubin: 1.4 mg/dL — ABNORMAL HIGH (ref 0.2–1.2)

## 2013-09-22 LAB — CBC WITH DIFFERENTIAL/PLATELET
BASOS ABS: 0 10*3/uL (ref 0.0–0.1)
Basophils Relative: 0.4 % (ref 0.0–3.0)
EOS ABS: 0.1 10*3/uL (ref 0.0–0.7)
Eosinophils Relative: 0.8 % (ref 0.0–5.0)
HCT: 39.3 % (ref 36.0–46.0)
Hemoglobin: 13.5 g/dL (ref 12.0–15.0)
LYMPHS ABS: 1.9 10*3/uL (ref 0.7–4.0)
LYMPHS PCT: 24.8 % (ref 12.0–46.0)
MCHC: 34.3 g/dL (ref 30.0–36.0)
MCV: 98.2 fl (ref 78.0–100.0)
MONO ABS: 0.7 10*3/uL (ref 0.1–1.0)
Monocytes Relative: 9.2 % (ref 3.0–12.0)
NEUTROS ABS: 5 10*3/uL (ref 1.4–7.7)
Neutrophils Relative %: 64.8 % (ref 43.0–77.0)
Platelets: 221 10*3/uL (ref 150.0–400.0)
RBC: 4 Mil/uL (ref 3.87–5.11)
RDW: 13 % (ref 11.5–15.5)
WBC: 7.7 10*3/uL (ref 4.0–10.5)

## 2013-09-22 LAB — BASIC METABOLIC PANEL
BUN: 14 mg/dL (ref 6–23)
CALCIUM: 9.6 mg/dL (ref 8.4–10.5)
CO2: 28 meq/L (ref 19–32)
CREATININE: 0.8 mg/dL (ref 0.4–1.2)
Chloride: 100 mEq/L (ref 96–112)
GFR: 73.35 mL/min (ref >60.00)
Glucose, Bld: 102 mg/dL — ABNORMAL HIGH (ref 70–99)
Potassium: 3.6 mEq/L (ref 3.5–5.1)
SODIUM: 136 meq/L (ref 135–145)

## 2013-09-22 LAB — LIPID PANEL
Cholesterol: 273 mg/dL — ABNORMAL HIGH (ref 0–200)
HDL: 32.1 mg/dL — ABNORMAL LOW (ref >39.00)
LDL Cholesterol: 179 mg/dL — ABNORMAL HIGH (ref 0–99)
NonHDL: 240.9
TRIGLYCERIDES: 310 mg/dL — AB (ref 0.0–149.0)
Total CHOL/HDL Ratio: 9
VLDL: 62 mg/dL — ABNORMAL HIGH (ref 0.0–40.0)

## 2013-09-22 LAB — TSH: TSH: 1.72 u[IU]/mL (ref 0.35–4.50)

## 2013-09-22 MED ORDER — LEVOTHYROXINE SODIUM 75 MCG PO TABS: 75.0000 ug | ORAL_TABLET | Freq: Every day | ORAL | Status: DC

## 2013-09-22 NOTE — Progress Notes (Signed)
   Subjective:    Patient ID: Breanna Hernandez, female    DOB: 1945/08/22, 68 y.o.   MRN: [id number]  HPI Patient is here to monitor thyroid status There has been no change in the dose, brand, or mode of administration of thyroid supplement.She takes the supplement in am with other meds. Last TSH on record 3.70 on 09/20/10; Cardiologist has been Rxing thyroid supplement.  She has not had her lipids checked for 16 months.    Review of Systems  Constitutional: No significant change in weight; significant fatigue; sleep disorder; change in appetite. Eye: no blurred, double ,loss of vision Cardiovascular: no palpitations; racing; irregularity ENT/GI: no hoarseness;dysphagia Derm: no change in nails,hair,skin Neuro: no numbness or tingling; tremor Psych:no anxiety; depression; panic attacks Endo: no temperature intolerance to heat ,cold  She describes IBS symptoms for at least a week. She been prescribed dicyclomine but this did not help. She was also worried about its possible adverse cardiovascular effects.        Objective:   Physical Exam  Significant or distinguishing  findings on physical exam are documented first.  Below that are other systems examined & findings. Based on CDC criteria severe obesity is present. Her hair is very fine with alopecia of anterior scalp She has a grade 9/1-6 systolic murmur at the base There is increased lordosis of the upper thoracic spine. Abdomen is massive without organomegaly or masses. Flexion contracture of the left fifth digit. She has crepitus of the knees without effusion. She has lipedema of the ankles.  1/2+ edema left lower extremity. Deep tendon reflexes are equal but 0-1/2 + @ knees.     Gen.:adequately nourished; in no acute distress Eyes: Extraocular motion intact; no lid lag or proptosis ,nystagmus Neck: full ROM; no masses ; thyroid normal  Heart: Normal rhythm and rate without significant  gallop, or extra heart  sounds Lungs: Chest clear to auscultation without rales,rales, wheezes Neuro: No tremor  Skin: Warm and dry without significant lesions or rashes; no onycholysis Lymphatic: no cervical or axillary LA Psych: Normally communicative and interactive; no abnormal mood or affect clinically.       Assessment & Plan:  See Current Assessment & Plan in Problem List under specific Diagnosis

## 2013-09-22 NOTE — Progress Notes (Signed)
Pre visit review using our clinic review tool, if applicable. No additional management support is needed unless otherwise documented below in the visit note. 

## 2013-09-22 NOTE — Assessment & Plan Note (Signed)
Blood pressure goals reviewed. BMET 

## 2013-09-22 NOTE — Assessment & Plan Note (Signed)
TSH 

## 2013-09-22 NOTE — Assessment & Plan Note (Addendum)
A1c, Lipids

## 2013-09-22 NOTE — Progress Notes (Signed)
   Subjective:    Patient ID: Breanna Hernandez, female    DOB: 1946/03/20, 68 y.o.   MRN: [id number]  HPI Pt is here for synthroid refill. She states she has not been seen in a couple of years by the PCP as her cardiologist has been refilling her medication. She has not had her TSH checked for some time now.    Review of Systems  Constitutional: Negative for fatigue and unexpected weight change.  Gastrointestinal: Negative for constipation.  Endocrine: Negative for cold intolerance.       No skin, hair or nail changes        Objective:   Physical Exam Gen.: Healthy and well-nourished in appearance. Alert, appropriate and cooperative throughout exam.  Eyes: No corneal or conjunctival inflammation noted. Pupils equal round reactive to light and accommodation. Extraocular motion intact.   Nose: External nasal exam reveals no deformity or inflammation. Nasal mucosa are pink and moist. No lesions or exudates noted.    Mouth: Oral mucosa and oropharynx reveal no lesions or exudates. Teeth in good repair.  Neck: No thyromegaly.   Lungs: Normal respiratory effort; Lungs are clear to auscultation bilaterally   Cardiac: Normal rate and rhythm. Normal S1 and S2. Systolic murmur.  Abdomen: Bowel sounds normal; abdomen soft and nontender.   Vascular: Radial artery pulses are full and equal. Pedal pulse present in RLE, diminished in LLE. +1 edema in LLE. Trace edema in RLE. No carotid bruits present.   Skin: Intact with some scattered ecchymosis on bilateral upper extremities. Vertical chest wall incisional scar, healed nicely.  Lymph: No cervical, axillary lymphadenopathy present.  Psych: Mood and affect are normal. Normally interactive                                                                                   Assessment & Plan:  #1 hypothyroidism; Rx synthroid. Check TSH today.

## 2013-09-22 NOTE — Assessment & Plan Note (Signed)
CBC & dif 

## 2013-09-22 NOTE — Patient Instructions (Addendum)
Your next office appointment will be determined based upon review of your pending labs . Those instructions will be transmitted to you through My Chart  . Minimal Blood Pressure Goal= AVERAGE < 140/90;  Ideal is an AVERAGE < 135/85. This AVERAGE should be calculated from @ least 5-7 BP readings taken @ different times of day on different days of week. You should not respond to isolated BP readings , but rather the AVERAGE for that week .Please bring your  blood pressure cuff to office visits to verify that it is reliable.It  can also be checked against the blood pressure device at the pharmacy. Finger or wrist cuffs are not dependable; an arm cuff is.  Please take a probiotic , Florastor OR Align, every day until the bowels are normal. This will replace the normal bacteria which  are necessary for formation of normal stool and processing of food.

## 2013-09-22 NOTE — Assessment & Plan Note (Signed)
LFTs 

## 2013-09-23 ENCOUNTER — Telehealth: Payer: Self-pay | Admitting: Internal Medicine

## 2013-09-23 NOTE — Telephone Encounter (Signed)
Relevant patient education assigned to patient using Emmi. ° °

## 2013-09-25 ENCOUNTER — Ambulatory Visit (INDEPENDENT_AMBULATORY_CARE_PROVIDER_SITE_OTHER): Payer: Medicare Other | Admitting: Pharmacist Clinician (PhC)/ Clinical Pharmacy Specialist

## 2013-09-25 VITALS — BP 122/64 | HR 60

## 2013-09-25 DIAGNOSIS — I4891 Unspecified atrial fibrillation: Secondary | ICD-10-CM

## 2013-09-25 DIAGNOSIS — Z7901 Long term (current) use of anticoagulants: Secondary | ICD-10-CM

## 2013-09-25 LAB — POCT INR: INR: 3.7

## 2013-09-29 ENCOUNTER — Other Ambulatory Visit: Payer: Self-pay | Admitting: *Deleted

## 2013-09-29 DIAGNOSIS — E039 Hypothyroidism, unspecified: Secondary | ICD-10-CM

## 2013-09-29 MED ORDER — LEVOTHYROXINE SODIUM 75 MCG PO TABS: 75.0000 ug | ORAL_TABLET | Freq: Every day | ORAL | Status: DC

## 2013-09-29 NOTE — Telephone Encounter (Signed)
Left msg on triage stating md sent [name] name synthroid to her pharmacy. Wanting the generic levothyroxine sent in. Called pt back inform her will send...Breanna Hernandez

## 2013-10-02 ENCOUNTER — Ambulatory Visit (HOSPITAL_BASED_OUTPATIENT_CLINIC_OR_DEPARTMENT_OTHER): Payer: BLUE CROSS/BLUE SHIELD | Admitting: Hematology and Oncology

## 2013-10-02 ENCOUNTER — Encounter: Payer: Self-pay | Admitting: Hematology and Oncology

## 2013-10-02 ENCOUNTER — Other Ambulatory Visit: Payer: Self-pay | Admitting: *Deleted

## 2013-10-02 ENCOUNTER — Other Ambulatory Visit: Payer: BLUE CROSS/BLUE SHIELD

## 2013-10-02 ENCOUNTER — Telehealth: Payer: Self-pay | Admitting: Hematology and Oncology

## 2013-10-02 ENCOUNTER — Ambulatory Visit: Payer: BLUE CROSS/BLUE SHIELD | Admitting: Oncology

## 2013-10-02 ENCOUNTER — Other Ambulatory Visit (HOSPITAL_BASED_OUTPATIENT_CLINIC_OR_DEPARTMENT_OTHER): Payer: Medicare Other

## 2013-10-02 VITALS — BP 155/63 | HR 53 | Temp 97.4°F | Resp 19 | Ht 65.0 in | Wt 245.1 lb

## 2013-10-02 DIAGNOSIS — Z853 Personal history of malignant neoplasm of breast: Secondary | ICD-10-CM

## 2013-10-02 DIAGNOSIS — G629 Polyneuropathy, unspecified: Secondary | ICD-10-CM | POA: Insufficient documentation

## 2013-10-02 DIAGNOSIS — G589 Mononeuropathy, unspecified: Secondary | ICD-10-CM

## 2013-10-02 DIAGNOSIS — R269 Unspecified abnormalities of gait and mobility: Secondary | ICD-10-CM

## 2013-10-02 LAB — CBC WITH DIFFERENTIAL/PLATELET
BASO%: 0.6 % (ref 0.0–2.0)
BASOS ABS: 0 10*3/uL (ref 0.0–0.1)
EOS%: 0.7 % (ref 0.0–7.0)
Eosinophils Absolute: 0 10*3/uL (ref 0.0–0.5)
HEMATOCRIT: 38.9 % (ref 34.8–46.6)
HEMOGLOBIN: 13.1 g/dL (ref 11.6–15.9)
LYMPH%: 30.4 % (ref 14.0–49.7)
MCH: 33.2 pg (ref 25.1–34.0)
MCHC: 33.7 g/dL (ref 31.5–36.0)
MCV: 98.5 fL (ref 79.5–101.0)
MONO#: 0.6 10*3/uL (ref 0.1–0.9)
MONO%: 8.8 % (ref 0.0–14.0)
NEUT#: 4 10*3/uL (ref 1.5–6.5)
NEUT%: 59.5 % (ref 38.4–76.8)
Platelets: 219 10*3/uL (ref 145–400)
RBC: 3.96 10*6/uL (ref 3.70–5.45)
RDW: 12.9 % (ref 11.2–14.5)
WBC: 6.6 10*3/uL (ref 3.9–10.3)
lymph#: 2 10*3/uL (ref 0.9–3.3)

## 2013-10-02 LAB — HEPATIC FUNCTION PANEL
ALT: 24 U/L (ref 0–35)
AST: 26 U/L (ref 0–37)
Albumin: 4.1 g/dL (ref 3.5–5.2)
Alkaline Phosphatase: 75 U/L (ref 39–117)
BILIRUBIN DIRECT: 0.1 mg/dL (ref 0.0–0.3)
BILIRUBIN INDIRECT: 1 mg/dL (ref 0.2–1.2)
Total Bilirubin: 1.1 mg/dL (ref 0.2–1.2)
Total Protein: 7.3 g/dL (ref 6.0–8.3)

## 2013-10-02 LAB — FERRITIN CHCC: Ferritin: 91 ng/ml (ref 9–269)

## 2013-10-02 NOTE — Telephone Encounter (Signed)
S/w the pt and she is aware of her yearly appt in june

## 2013-10-02 NOTE — Assessment & Plan Note (Signed)
Cause is unknown. She complained of numbness from the bottom of her legs to the knees. Breanna Hernandez complain of some mild unsteady gait. I would recheck iron studies just in case that the neuropathy is related to iron overload. Results of ferritin is within normal limits

## 2013-10-02 NOTE — Assessment & Plan Note (Signed)
She had left breast cancer treated with local therapy, intolerance to adjuvant Aromasin. I recommend continue history, physical examination and yearly mammogram.   

## 2013-10-02 NOTE — Progress Notes (Signed)
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 female was transferred to my care after her prior physician has left.  I reviewed the patient's records extensive and collaborated the history with the patient. Summary of her history is as follows: This patient had history of metachronous primary bilateral breast cancers. 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 Started on hormonal therapy with Aromasin which she stopped due to excessive weight gain. Of note, she took it for less than a year.  Most recent mammogram done 04/18/2012 showed a 4 mm nodular density upper inner quadrant left breast which was not seen on ultrasound. 6 month interval study recommended by the radiologist including ultrasound were normal.  The cancer was detected by screening mammogram. The cancer was not palpable prior to diagnosis In terms of breast cancer risk profile:  She menarched at early age of 36 and went to menopause at age 65  She had 6 pregnancy, her first child was born at age 58  She breast-fed all her children.  She never received birth control pills.  She was never exposed to fertility medications or hormone replacement therapy.  She has no family history of Breast/GYN/GI cancer  She denies any recent abnormal breast examination, palpable mass, abnormal breast appearance or nipple changes  MEDICAL HISTORY:  Past Medical History  Diagnosis Date  . HTN  (hypertension)   . Hypothyroidism   . Breast cancer     x 3  . 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 Diverticulosis   . Hyperplastic colon polyp   . Hyperlipidemia   . Obesity   . IBS (irritable bowel syndrome)   . Atrial fibrillation     h/o post-op 04/2010  . Renal cyst, left   . Fatty liver 01/10/07  . Lower extremity neuropathy     SURGICAL HISTORY: Past Surgical History  Procedure Laterality Date  . Cholecystectomy    . Tonsillectomy    . Colonoscopy w/ polypectomy    . Breast lumpectomy  2000    Right x2, Left x1  . Median sternotomy, extracorporeal circulation, repair of type 1 aortic dissection with  tube graft from  sinotubular junction to take off of innominate artery, distal anastomoses under deep bypothermic circulatory arrest  05/03/2010    Dr Roxan Hockey  . Cabg x 1  05/03/2010    RCA -Dr Roxan Hockey (with type 1 aortic dissection)    SOCIAL HISTORY: History   Social History  . Marital Status: Married    Spouse Name: N/A    Number of Children: N/A  . Years of Education: N/A   Occupational History  . Not on file.   Social History Main Topics  . Smoking status: Never Smoker   . Smokeless tobacco: Never Used  . Alcohol Use: No  . Drug Use: No  . Sexual Activity: Not on file   Other Topics Concern  . Not on file   Social History Narrative  . No narrative on file    FAMILY HISTORY: Family  History  Problem Relation Age of Onset  . Aneurysm Father     CNS  . Heart attack Father     x2  . Alzheimer's disease Mother   . Hypertension Mother   . Hypertension Brother   . Goiter Mother   . Colon cancer Neg Hx     ALLERGIES:  has No Known Allergies.  MEDICATIONS:  Current Outpatient Prescriptions  Medication Sig Dispense Refill  . amLODipine (NORVASC) 5 MG tablet TAKE ONE TABLET BY MOUTH ONCE DAILY  30 tablet  11  . aspirin 81 MG tablet Take 81 mg by mouth daily.        . benazepril (LOTENSIN) 40 MG tablet Take 1 tablet (40 mg  total) by mouth daily.  90 tablet  3  . CALCIUM-MAGNESIUM-VITAMIN D PO Take 2 tablets by mouth daily.      . fish oil-omega-3 fatty acids 1000 MG capsule Take by mouth 3 (three) times daily.       . hydrochlorothiazide (MICROZIDE) 12.5 MG capsule TAKE ONE CAPSULE BY MOUTH ONCE DAILY  90 capsule  3  . levothyroxine (SYNTHROID, LEVOTHROID) 75 MCG tablet Take 1 tablet (75 mcg total) by mouth daily before breakfast.  90 tablet  3  . LORazepam (ATIVAN) 1 MG tablet 1 by mouth " as needed" and should not be taken on a regular basis. Regular use can increase risk of addiction but more importantly affect level of alertness and balance with increased risk of falling. APPOINTMENT DUE  30 tablet  0  . metoprolol (LOPRESSOR) 50 MG tablet Take 1 tablet (50 mg total) by mouth 2 (two) times daily.  180 tablet  0  . warfarin (COUMADIN) 4 MG tablet TAKE ONE & ONE-HALF TABLETS BY MOUTH ONCE DAILY OR AS DIRECTED  135 tablet  1  . [DISCONTINUED] amLODipine-benazepril (LOTREL) 5-20 MG per capsule Take 1 capsule by mouth daily.        . [DISCONTINUED] escitalopram (LEXAPRO) 10 MG tablet Take 10 mg by mouth daily.        . [DISCONTINUED] lisinopril (PRINIVIL,ZESTRIL) 20 MG tablet Take 20 mg by mouth daily.         No current facility-administered medications for this visit.    REVIEW OF SYSTEMS:   Constitutional: Denies fevers, chills or abnormal night sweats Eyes: Denies blurriness of vision, double vision or watery eyes Ears, nose, mouth, throat, and face: Denies mucositis or sore throat Respiratory: Denies cough, dyspnea or wheezes Cardiovascular: Denies palpitation, chest discomfort or lower extremity swelling Gastrointestinal:  Denies nausea, heartburn or change in bowel habits Skin: Denies abnormal skin rashes Lymphatics: Denies new lymphadenopathy or easy bruising Neurological: She complained of progressive numbness in the legs of unknown etiology. Behavioral/Psych: Mood is stable, no new changes  All other  systems were reviewed with the patient and are negative.  PHYSICAL EXAMINATION: ECOG PERFORMANCE STATUS: 1 - Symptomatic but completely ambulatory  Filed Vitals:   10/02/13 1331  BP: 155/63  Pulse: 53  Temp: 97.4 F (36.3 C)  Resp: 19   Filed Weights   10/02/13 1331  Weight: 245 lb 1.6 oz (111.177 kg)    GENERAL:alert, no distress and comfortable. She is morbidly obese SKIN: skin color, texture, turgor are normal, no rashes or significant lesions EYES: normal, conjunctiva are pink and non-injected, sclera clear OROPHARYNX:no exudate, normal lips, buccal mucosa, and tongue  NECK: supple, thyroid normal size, non-tender, without nodularity LYMPH:  no palpable lymphadenopathy in the cervical, axillary or inguinal LUNGS: clear to  auscultation and percussion with normal breathing effort HEART: regular rate & rhythm and no murmurs without lower extremity edema ABDOMEN:abdomen soft, non-tender and normal bowel sounds Musculoskeletal:no cyanosis of digits and no clubbing  PSYCH: alert & oriented x 3 with fluent speech NEURO: no focal motor/sensory deficits Bilateral breast examination revealed well-healed surgical scars. LABORATORY DATA:  I have reviewed the data as listed Lab Results  Component Value Date   WBC 6.6 10/02/2013   HGB 13.1 10/02/2013   HCT 38.9 10/02/2013   MCV 98.5 10/02/2013   PLT 219 10/02/2013    Recent Labs  10/03/12 1537 06/22/13 1149 09/22/13 0957  NA  --   --  136  K  --   --  3.6  CL  --   --  100  CO2  --   --  28  GLUCOSE  --   --  102*  BUN  --  15 14  CREATININE  --  0.77 0.8  CALCIUM  --   --  9.6  PROT 7.3  --  7.7  ALBUMIN 4.1  --  4.0  AST 29  --  31  ALT 24  --  31  ALKPHOS 87  --  80  BILITOT 1.4*  --  1.4*  BILIDIR 0.2  --  0.1  IBILI 1.2*  --   --     RADIOGRAPHIC STUDIES: I reviewed the report of the last mammogram. I have personally reviewed the radiological images as listed and agreed with the findings in the  report.   ASSESSMENT & PLAN:  BREAST CANCER, HX OF She had left breast cancer treated with local therapy, intolerance to adjuvant Aromasin. I recommend continue history, physical examination and yearly mammogram.  Neuropathy Cause is unknown. She complained of numbness from the bottom of her legs to the knees. Broadus John complain of some mild unsteady gait. I would recheck iron studies just in case that the neuropathy is related to iron overload. Results of ferritin is within normal limits     All questions were answered. The patient knows to call the clinic with any problems, questions or concerns. I spent 25 minutes counseling the patient face to face. The total time spent in the appointment was 30 minutes and more than 50% was on counseling.     [facility], [place], MD 10/02/2013 2:32 PM

## 2013-10-07 ENCOUNTER — Telehealth: Payer: Self-pay | Admitting: Pharmacist Clinician (PhC)/ Clinical Pharmacy Specialist

## 2013-10-08 ENCOUNTER — Ambulatory Visit: Payer: Medicare Other | Admitting: Pharmacist Clinician (PhC)/ Clinical Pharmacy Specialist

## 2013-10-08 NOTE — Telephone Encounter (Signed)
Encounter complete. 

## 2013-10-12 ENCOUNTER — Ambulatory Visit (INDEPENDENT_AMBULATORY_CARE_PROVIDER_SITE_OTHER): Payer: Medicare Other | Admitting: Pharmacist

## 2013-10-12 DIAGNOSIS — Z7901 Long term (current) use of anticoagulants: Secondary | ICD-10-CM

## 2013-10-12 DIAGNOSIS — I4891 Unspecified atrial fibrillation: Secondary | ICD-10-CM

## 2013-10-12 LAB — POCT INR: INR: 3.9

## 2013-10-12 NOTE — Telephone Encounter (Signed)
Closed enounter °

## 2013-10-19 ENCOUNTER — Other Ambulatory Visit: Payer: Self-pay | Admitting: Cardiovascular Disease

## 2013-10-19 NOTE — Telephone Encounter (Signed)
Rx was sent to pharmacy electronically. 

## 2013-10-27 ENCOUNTER — Ambulatory Visit (INDEPENDENT_AMBULATORY_CARE_PROVIDER_SITE_OTHER): Payer: Medicare Other | Admitting: Pharmacist Clinician (PhC)/ Clinical Pharmacy Specialist

## 2013-10-27 VITALS — BP 130/68 | HR 52

## 2013-10-27 DIAGNOSIS — Z7901 Long term (current) use of anticoagulants: Secondary | ICD-10-CM

## 2013-10-27 DIAGNOSIS — I4891 Unspecified atrial fibrillation: Secondary | ICD-10-CM

## 2013-10-27 LAB — POCT INR: INR: 2.5

## 2013-11-24 ENCOUNTER — Ambulatory Visit: Payer: Medicare Other | Admitting: Pharmacist Clinician (PhC)/ Clinical Pharmacy Specialist

## 2013-11-25 ENCOUNTER — Ambulatory Visit (INDEPENDENT_AMBULATORY_CARE_PROVIDER_SITE_OTHER): Payer: Medicare Other | Admitting: Pharmacist Clinician (PhC)/ Clinical Pharmacy Specialist

## 2013-11-25 VITALS — BP 126/72 | HR 60

## 2013-11-25 DIAGNOSIS — Z7901 Long term (current) use of anticoagulants: Secondary | ICD-10-CM

## 2013-11-25 DIAGNOSIS — I4891 Unspecified atrial fibrillation: Secondary | ICD-10-CM

## 2013-11-25 LAB — POCT INR: INR: 2.5

## 2013-11-28 ENCOUNTER — Encounter: Payer: Self-pay | Admitting: Internal Medicine

## 2013-12-23 ENCOUNTER — Ambulatory Visit (INDEPENDENT_AMBULATORY_CARE_PROVIDER_SITE_OTHER): Payer: Medicare Other | Admitting: Pharmacist Clinician (PhC)/ Clinical Pharmacy Specialist

## 2013-12-23 VITALS — BP 122/70 | HR 56

## 2013-12-23 DIAGNOSIS — I4891 Unspecified atrial fibrillation: Secondary | ICD-10-CM

## 2013-12-23 DIAGNOSIS — Z7901 Long term (current) use of anticoagulants: Secondary | ICD-10-CM

## 2013-12-23 LAB — POCT INR: INR: 2.3

## 2014-01-22 ENCOUNTER — Other Ambulatory Visit: Payer: Self-pay

## 2014-02-03 ENCOUNTER — Ambulatory Visit (INDEPENDENT_AMBULATORY_CARE_PROVIDER_SITE_OTHER): Payer: Medicare Other | Admitting: Pharmacist Clinician (PhC)/ Clinical Pharmacy Specialist

## 2014-02-03 DIAGNOSIS — I4891 Unspecified atrial fibrillation: Secondary | ICD-10-CM

## 2014-02-03 DIAGNOSIS — Z7901 Long term (current) use of anticoagulants: Secondary | ICD-10-CM

## 2014-02-03 LAB — POCT INR: INR: 2

## 2014-02-10 ENCOUNTER — Telehealth: Payer: Self-pay | Admitting: Pharmacist Clinician (PhC)/ Clinical Pharmacy Specialist

## 2014-02-10 NOTE — Telephone Encounter (Signed)
Pt called, stating had some light vaginal bleeding start over weekend.  Called her GYN, has appt in about 2 weeks.  Advised pt to hold dose x 1 then resume warfarin.  Call if bleeding continues/worsens before appt with GYN.  Pt voiced understanding

## 2014-02-19 ENCOUNTER — Other Ambulatory Visit: Payer: Self-pay | Admitting: Obstetrics and Gynecology

## 2014-02-19 ENCOUNTER — Other Ambulatory Visit (HOSPITAL_COMMUNITY)
Admission: RE | Admit: 2014-02-19 | Discharge: 2014-02-19 | Disposition: A | Discharge status: Home / Self Care | Payer: Medicare Other | Source: Ambulatory Visit | Attending: Obstetrics and Gynecology | Admitting: Obstetrics and Gynecology

## 2014-02-19 DIAGNOSIS — Z1151 Encounter for screening for human papillomavirus (HPV): Secondary | ICD-10-CM | POA: Insufficient documentation

## 2014-02-19 DIAGNOSIS — Z01419 Encounter for gynecological examination (general) (routine) without abnormal findings: Secondary | ICD-10-CM | POA: Insufficient documentation

## 2014-02-24 LAB — CYTOLOGY - PAP

## 2014-03-12 ENCOUNTER — Other Ambulatory Visit: Payer: Self-pay | Admitting: Pharmacist Clinician (PhC)/ Clinical Pharmacy Specialist

## 2014-03-17 ENCOUNTER — Ambulatory Visit (INDEPENDENT_AMBULATORY_CARE_PROVIDER_SITE_OTHER): Payer: Medicare Other | Admitting: Pharmacist Clinician (PhC)/ Clinical Pharmacy Specialist

## 2014-03-17 ENCOUNTER — Telehealth: Payer: Self-pay | Admitting: *Deleted

## 2014-03-17 DIAGNOSIS — Z7901 Long term (current) use of anticoagulants: Secondary | ICD-10-CM

## 2014-03-17 DIAGNOSIS — I4891 Unspecified atrial fibrillation: Secondary | ICD-10-CM

## 2014-03-17 LAB — POCT INR: INR: 2.3

## 2014-03-17 NOTE — Telephone Encounter (Signed)
Signed surgical clearance faxed - low risk for D&C/Hysteroscopy - OK to stop Warfarin 5 days before surgery. 03/16/2014.

## 2014-03-18 ENCOUNTER — Telehealth: Payer: Self-pay | Admitting: Pharmacist Clinician (PhC)/ Clinical Pharmacy Specialist

## 2014-03-18 NOTE — Telephone Encounter (Signed)
-----   Message from Sanda Klein, MD sent at 03/17/2014  3:47 PM EST ----- Yes, in fact I think I may have already filled out the request that came from the gynecologist office ----- Message -----    From: Tommy Medal, RPH-CPP    Sent: 03/17/2014   3:43 PM      To: Sanda Klein, MD  Dr. Loletha Grayer  Ms. Dost is scheduled to have a D&C with possible polypectcomy on January 9.  Ok to hold warfarin?  Erasmo Downer

## 2014-03-18 NOTE — Telephone Encounter (Signed)
LMOM for patient to hold x 5 days prior to procedure.  I will see her before procedure to confirm information.

## 2014-03-31 ENCOUNTER — Other Ambulatory Visit: Payer: Self-pay | Admitting: Pharmacist Clinician (PhC)/ Clinical Pharmacy Specialist

## 2014-04-08 NOTE — Patient Instructions (Addendum)
   Your procedure is scheduled on: Tuesday, Jan 5  Enter through the [employer] of [facility] at:  12:30 PM Pick up the phone at the desk and dial [phone] and inform us of your arrival.  Please call this number if you have any problems the morning of surgery: [phone]  Remember: Do not eat food after midnight: Monday Do not drink clear liquids after: 10 AM Tuesday, day of surgery Take these medicines the morning of surgery with a SIP OF WATER: take medications as you normally do   Do not wear jewelry, make-up, or FINGER nail polish No metal in your hair or on your body. Do not wear lotions, powders, perfumes.  You may wear deodorant.  Do not bring valuables to the hospital. Contacts, dentures or bridgework may not be worn into surgery.  Leave suitcase in the car. After Surgery it may be brought to your room. For patients being admitted to the hospital, checkout time is 11:00am the day of discharge.    Patients discharged on the day of surgery will not be allowed to drive home.

## 2014-04-12 ENCOUNTER — Encounter (HOSPITAL_COMMUNITY)
Admission: RE | Admit: 2014-04-12 | Discharge: 2014-04-12 | Disposition: A | Discharge status: Home / Self Care | Payer: Medicare Other | Source: Ambulatory Visit | Attending: Obstetrics and Gynecology | Admitting: Obstetrics and Gynecology

## 2014-04-12 ENCOUNTER — Other Ambulatory Visit: Payer: Self-pay | Admitting: Obstetrics and Gynecology

## 2014-04-12 ENCOUNTER — Ambulatory Visit (INDEPENDENT_AMBULATORY_CARE_PROVIDER_SITE_OTHER): Payer: Medicare Other | Admitting: Pharmacist Clinician (PhC)/ Clinical Pharmacy Specialist

## 2014-04-12 DIAGNOSIS — K589 Irritable bowel syndrome without diarrhea: Secondary | ICD-10-CM | POA: Diagnosis not present

## 2014-04-12 DIAGNOSIS — N8500 Endometrial hyperplasia, unspecified: Secondary | ICD-10-CM | POA: Diagnosis not present

## 2014-04-12 DIAGNOSIS — I10 Essential (primary) hypertension: Secondary | ICD-10-CM | POA: Diagnosis not present

## 2014-04-12 DIAGNOSIS — N840 Polyp of corpus uteri: Secondary | ICD-10-CM | POA: Diagnosis not present

## 2014-04-12 DIAGNOSIS — Z560 Unemployment, unspecified: Secondary | ICD-10-CM | POA: Diagnosis not present

## 2014-04-12 DIAGNOSIS — Z79899 Other long term (current) drug therapy: Secondary | ICD-10-CM | POA: Diagnosis not present

## 2014-04-12 DIAGNOSIS — Z7901 Long term (current) use of anticoagulants: Secondary | ICD-10-CM

## 2014-04-12 DIAGNOSIS — K219 Gastro-esophageal reflux disease without esophagitis: Secondary | ICD-10-CM | POA: Diagnosis not present

## 2014-04-12 DIAGNOSIS — I4891 Unspecified atrial fibrillation: Secondary | ICD-10-CM | POA: Diagnosis not present

## 2014-04-12 DIAGNOSIS — N950 Postmenopausal bleeding: Secondary | ICD-10-CM | POA: Diagnosis present

## 2014-04-12 DIAGNOSIS — Z853 Personal history of malignant neoplasm of breast: Secondary | ICD-10-CM | POA: Diagnosis not present

## 2014-04-12 DIAGNOSIS — R938 Abnormal findings on diagnostic imaging of other specified body structures: Secondary | ICD-10-CM | POA: Diagnosis not present

## 2014-04-12 DIAGNOSIS — Z8249 Family history of ischemic heart disease and other diseases of the circulatory system: Secondary | ICD-10-CM | POA: Diagnosis not present

## 2014-04-12 DIAGNOSIS — G629 Polyneuropathy, unspecified: Secondary | ICD-10-CM | POA: Diagnosis not present

## 2014-04-12 DIAGNOSIS — E039 Hypothyroidism, unspecified: Secondary | ICD-10-CM | POA: Diagnosis not present

## 2014-04-12 DIAGNOSIS — C541 Malignant neoplasm of endometrium: Secondary | ICD-10-CM | POA: Diagnosis not present

## 2014-04-12 LAB — BASIC METABOLIC PANEL
ANION GAP: 8 (ref 5–15)
BUN: 14 mg/dL (ref 6–23)
CHLORIDE: 101 meq/L (ref 96–112)
CO2: 29 mmol/L (ref 19–32)
CREATININE: 0.78 mg/dL (ref 0.50–1.10)
Calcium: 9.4 mg/dL (ref 8.4–10.5)
GFR calc Af Amer: >90 mL/min (ref >90)
GFR calc non Af Amer: 83 mL/min — ABNORMAL LOW (ref >90)
Glucose, Bld: 99 mg/dL (ref 70–99)
Potassium: 3.9 mmol/L (ref 3.5–5.1)
Sodium: 138 mmol/L (ref 135–145)

## 2014-04-12 LAB — CBC
HCT: 37.8 % (ref 36.0–46.0)
HEMOGLOBIN: 13.3 g/dL (ref 12.0–15.0)
MCH: 34.8 pg — AB (ref 26.0–34.0)
MCHC: 35.2 g/dL (ref 30.0–36.0)
MCV: 99 fL (ref 78.0–100.0)
PLATELETS: 194 10*3/uL (ref 150–400)
RBC: 3.82 MIL/uL — AB (ref 3.87–5.11)
RDW: 12.7 % (ref 11.5–15.5)
WBC: 7.8 10*3/uL (ref 4.0–10.5)

## 2014-04-12 LAB — POCT INR: INR: 1.4

## 2014-04-13 ENCOUNTER — Ambulatory Visit (HOSPITAL_COMMUNITY): Payer: Medicare Other | Admitting: Anesthesiology

## 2014-04-13 ENCOUNTER — Encounter (HOSPITAL_COMMUNITY): Payer: Self-pay | Admitting: Anesthesiology

## 2014-04-13 ENCOUNTER — Ambulatory Visit (HOSPITAL_COMMUNITY)
Admission: RE | Admit: 2014-04-13 | Discharge: 2014-04-13 | Disposition: A | Discharge status: Home / Self Care | Payer: Medicare Other | Source: Ambulatory Visit | Attending: Obstetrics and Gynecology | Admitting: Obstetrics and Gynecology

## 2014-04-13 ENCOUNTER — Encounter (HOSPITAL_COMMUNITY)
Admission: RE | Disposition: A | Discharge status: Home / Self Care | Payer: Self-pay | Source: Ambulatory Visit | Attending: Obstetrics and Gynecology

## 2014-04-13 DIAGNOSIS — N8500 Endometrial hyperplasia, unspecified: Secondary | ICD-10-CM | POA: Insufficient documentation

## 2014-04-13 DIAGNOSIS — N950 Postmenopausal bleeding: Secondary | ICD-10-CM | POA: Diagnosis present

## 2014-04-13 DIAGNOSIS — R938 Abnormal findings on diagnostic imaging of other specified body structures: Secondary | ICD-10-CM | POA: Insufficient documentation

## 2014-04-13 DIAGNOSIS — Z7901 Long term (current) use of anticoagulants: Secondary | ICD-10-CM | POA: Insufficient documentation

## 2014-04-13 DIAGNOSIS — C541 Malignant neoplasm of endometrium: Secondary | ICD-10-CM | POA: Insufficient documentation

## 2014-04-13 DIAGNOSIS — K219 Gastro-esophageal reflux disease without esophagitis: Secondary | ICD-10-CM | POA: Insufficient documentation

## 2014-04-13 DIAGNOSIS — Z79899 Other long term (current) drug therapy: Secondary | ICD-10-CM | POA: Insufficient documentation

## 2014-04-13 DIAGNOSIS — Z560 Unemployment, unspecified: Secondary | ICD-10-CM | POA: Insufficient documentation

## 2014-04-13 DIAGNOSIS — Z8249 Family history of ischemic heart disease and other diseases of the circulatory system: Secondary | ICD-10-CM | POA: Insufficient documentation

## 2014-04-13 DIAGNOSIS — N840 Polyp of corpus uteri: Secondary | ICD-10-CM | POA: Insufficient documentation

## 2014-04-13 DIAGNOSIS — E039 Hypothyroidism, unspecified: Secondary | ICD-10-CM | POA: Insufficient documentation

## 2014-04-13 DIAGNOSIS — I10 Essential (primary) hypertension: Secondary | ICD-10-CM | POA: Insufficient documentation

## 2014-04-13 DIAGNOSIS — K589 Irritable bowel syndrome without diarrhea: Secondary | ICD-10-CM | POA: Insufficient documentation

## 2014-04-13 DIAGNOSIS — I4891 Unspecified atrial fibrillation: Secondary | ICD-10-CM | POA: Insufficient documentation

## 2014-04-13 DIAGNOSIS — Z853 Personal history of malignant neoplasm of breast: Secondary | ICD-10-CM | POA: Insufficient documentation

## 2014-04-13 DIAGNOSIS — G629 Polyneuropathy, unspecified: Secondary | ICD-10-CM | POA: Insufficient documentation

## 2014-04-13 HISTORY — PX: HYSTEROSCOPY WITH D & C: SHX1775

## 2014-04-13 LAB — PROTIME-INR
INR: 1.18 (ref 0.00–1.49)
Prothrombin Time: 15.2 seconds (ref 11.6–15.2)

## 2014-04-13 SURGERY — DILATATION AND CURETTAGE /HYSTEROSCOPY
Anesthesia: General

## 2014-04-13 MED ORDER — LACTATED RINGERS IV SOLN
INTRAVENOUS | Status: DC
  Administered 2014-04-13 (×2): via INTRAVENOUS

## 2014-04-13 MED ORDER — PROPOFOL 10 MG/ML IV BOLUS
INTRAVENOUS | Status: AC
  Filled 2014-04-13: qty 20

## 2014-04-13 MED ORDER — EPHEDRINE 5 MG/ML INJ
INTRAVENOUS | Status: AC
  Filled 2014-04-13: qty 10

## 2014-04-13 MED ORDER — BUPIVACAINE HCL (PF) 0.25 % IJ SOLN
INTRAMUSCULAR | Status: AC
  Filled 2014-04-13: qty 30

## 2014-04-13 MED ORDER — GLYCINE 1.5 % IR SOLN
Status: DC | PRN
  Administered 2014-04-13: 3000 mL

## 2014-04-13 MED ORDER — ONDANSETRON HCL 4 MG/2ML IJ SOLN
INTRAMUSCULAR | Status: DC | PRN
  Administered 2014-04-13: 4 mg via INTRAVENOUS

## 2014-04-13 MED ORDER — DEXAMETHASONE SODIUM PHOSPHATE 4 MG/ML IJ SOLN
INTRAMUSCULAR | Status: DC | PRN
  Administered 2014-04-13: 4 mg via INTRAVENOUS

## 2014-04-13 MED ORDER — DEXAMETHASONE SODIUM PHOSPHATE 4 MG/ML IJ SOLN
INTRAMUSCULAR | Status: AC
  Filled 2014-04-13: qty 1

## 2014-04-13 MED ORDER — EPHEDRINE SULFATE 50 MG/ML IJ SOLN
INTRAMUSCULAR | Status: DC | PRN
  Administered 2014-04-13 (×3): 10 mg via INTRAVENOUS

## 2014-04-13 MED ORDER — FENTANYL CITRATE 0.05 MG/ML IJ SOLN
25.0000 ug | INTRAMUSCULAR | Status: DC | PRN
  Administered 2014-04-13: 50 ug via INTRAVENOUS

## 2014-04-13 MED ORDER — BUPIVACAINE HCL (PF) 0.25 % IJ SOLN
INTRAMUSCULAR | Status: DC | PRN
  Administered 2014-04-13: 20 mL

## 2014-04-13 MED ORDER — MIDAZOLAM HCL 2 MG/2ML IJ SOLN
INTRAMUSCULAR | Status: DC | PRN
  Administered 2014-04-13 (×2): 1 mg via INTRAVENOUS

## 2014-04-13 MED ORDER — SILVER NITRATE-POT NITRATE 75-25 % EX MISC
CUTANEOUS | Status: AC
  Filled 2014-04-13: qty 1

## 2014-04-13 MED ORDER — PROPOFOL 10 MG/ML IV BOLUS
INTRAVENOUS | Status: DC | PRN
  Administered 2014-04-13: 170 mg via INTRAVENOUS

## 2014-04-13 MED ORDER — LIDOCAINE HCL (CARDIAC) 20 MG/ML IV SOLN
INTRAVENOUS | Status: DC | PRN
  Administered 2014-04-13: 80 mg via INTRAVENOUS

## 2014-04-13 MED ORDER — HYDROCODONE-ACETAMINOPHEN 5-325 MG PO TABS: 1.0000 | ORAL_TABLET | Freq: Four times a day (QID) | ORAL | Status: DC | PRN

## 2014-04-13 MED ORDER — MIDAZOLAM HCL 2 MG/2ML IJ SOLN
INTRAMUSCULAR | Status: AC
  Filled 2014-04-13: qty 2

## 2014-04-13 MED ORDER — LIDOCAINE HCL (CARDIAC) 20 MG/ML IV SOLN
INTRAVENOUS | Status: AC
  Filled 2014-04-13: qty 5

## 2014-04-13 MED ORDER — FENTANYL CITRATE 0.05 MG/ML IJ SOLN
INTRAMUSCULAR | Status: DC | PRN
  Administered 2014-04-13: 25 ug via INTRAVENOUS
  Administered 2014-04-13 (×3): 50 ug via INTRAVENOUS
  Administered 2014-04-13: 25 ug via INTRAVENOUS

## 2014-04-13 MED ORDER — GLYCOPYRROLATE 0.2 MG/ML IJ SOLN
INTRAMUSCULAR | Status: DC | PRN
  Administered 2014-04-13: 0.1 mg via INTRAVENOUS

## 2014-04-13 MED ORDER — FENTANYL CITRATE 0.05 MG/ML IJ SOLN
INTRAMUSCULAR | Status: AC
  Filled 2014-04-13: qty 2

## 2014-04-13 MED ORDER — ONDANSETRON HCL 4 MG/2ML IJ SOLN
INTRAMUSCULAR | Status: AC
  Filled 2014-04-13: qty 2

## 2014-04-13 MED ORDER — PHENYLEPHRINE 40 MCG/ML (10ML) SYRINGE FOR IV PUSH (FOR BLOOD PRESSURE SUPPORT)
PREFILLED_SYRINGE | INTRAVENOUS | Status: AC
  Filled 2014-04-13: qty 10

## 2014-04-13 SURGICAL SUPPLY — 17 items
CANISTER SUCT 3000ML (MISCELLANEOUS) ×3 IMPLANT
CATH ROBINSON RED A/P 16FR (CATHETERS) ×3 IMPLANT
CLOTH BEACON ORANGE TIMEOUT ST (SAFETY) ×3 IMPLANT
CONTAINER PREFILL 10% NBF 60ML (FORM) ×6 IMPLANT
ELECT REM PT RETURN 9FT ADLT (ELECTROSURGICAL) ×3
ELECTRODE REM PT RTRN 9FT ADLT (ELECTROSURGICAL) ×1 IMPLANT
GLOVE BIOGEL M 6.5 STRL (GLOVE) ×6 IMPLANT
GLOVE BIOGEL PI IND STRL 6.5 (GLOVE) ×1 IMPLANT
GLOVE BIOGEL PI INDICATOR 6.5 (GLOVE) ×2
GOWN STRL REUS W/TWL LRG LVL3 (GOWN DISPOSABLE) ×6 IMPLANT
LOOP ANGLED CUTTING 22FR (CUTTING LOOP) IMPLANT
PACK VAGINAL MINOR WOMEN LF (CUSTOM PROCEDURE TRAY) ×3 IMPLANT
PAD OB MATERNITY 4.3X12.25 (PERSONAL CARE ITEMS) ×3 IMPLANT
TOWEL OR 17X24 6PK STRL BLUE (TOWEL DISPOSABLE) ×6 IMPLANT
TUBING AQUILEX INFLOW (TUBING) ×3 IMPLANT
TUBING AQUILEX OUTFLOW (TUBING) ×3 IMPLANT
WATER STERILE IRR 1000ML POUR (IV SOLUTION) ×3 IMPLANT

## 2014-04-13 NOTE — Op Note (Signed)
04/13/2014  4:23 PM  PATIENT:  Breanna Hernandez  69 y.o. female  PRE-OPERATIVE DIAGNOSIS:  Post Menopausal Bleeding,  Thickened Endometrium  POST-OPERATIVE DIAGNOSIS:  Post Menopausal Bleeding,  Thickened Endometrium  PROCEDURE:  Procedure(s) with comments: DILATATION AND CURETTAGE /HYSTEROSCOPY Polypectomy (N/A) - Possible Polypectomy  SURGEON:  Surgeon(s) and Role:    * Mong Neal J. Landry Mellow, MD - Primary  PHYSICIAN ASSISTANT:   ASSISTANTS: none   ANESTHESIA:   general  EBL:  Total I/O In: 1100 [I.V.:1100] Out: 56 [Urine:50; Blood:10]  BLOOD ADMINISTERED:none  DRAINS: none   LOCAL MEDICATIONS USED:  MARCAINE     SPECIMEN:  Source of Specimen:  endometrial currettings and polyps  DISPOSITION OF SPECIMEN:  PATHOLOGY  COUNTS:  YES  TOURNIQUET:  * No tourniquets in log *  DICTATION: .Dragon Dictation  PLAN OF CARE: Discharge to home after PACU  PATIENT DISPOSITION:  PACU - hemodynamically stable.   Delay start of Pharmacological VTE agent (>24hrs) due to surgical blood loss or risk of bleeding: not applicable  Indication 69 y/o with postmenopausal bleeding and thickened endometrium on ultrasound.   Findings: Proliferative endometrium and polyps noted   Procedure: Patient was taken to the operating room where she was placed under general anesthesia. She was placed in the dorsal lithotomy position. She was prepped and draped in the usual sterile fashion. A speculum was placed into the vaginal vault. The anterior lip of the cervix was grasped with a single-tooth tenaculum. Quarter percent Marcaine was injected at the 4 and 8:00 positions of the cervix. The cervix was then sounded to 8 cm. The cervix was dilated to approximately 6 mm. Diagnostic hysteroscope was inserted. The findings noted above. The hysteroscope was removed. Sharp curet was introduced and endometrial corrected curettings were obtained. The hysteroscope was then reinserted. There was no evidence of endometrial  polyps or masses with reinsertion of the hysteroscope. There was no evidence of perforation. Hysteroscope was then removed. The single-tooth tenaculum was removed from the anterior lip of the cervix. Patient was noted to have bleeding from the tenaculum site. Silver nitrate was applied and excellent hemostasis was noted. The speculum was removed from the patient's vagina. She was awakened from anesthesia taken care at country room awake and in stable condition. Sponge lap and needle counts were correct x2.

## 2014-04-13 NOTE — Anesthesia Preprocedure Evaluation (Addendum)
Anesthesia Evaluation  Patient identified by MRN, date of birth, ID band Patient awake    Reviewed: Allergy & Precautions, H&P , NPO status , Patient's Chart, lab work & pertinent test results, reviewed documented beta blocker date and time   Airway Mallampati: II  TM Distance: >3 FB Neck ROM: full    Dental no notable dental hx. (+)    Pulmonary  breath sounds clear to auscultation  Pulmonary exam normal       Cardiovascular hypertension (LVH, NS ST-T changes-diffuse. ), On Medications and Pt. on medications + dysrhythmias Atrial Fibrillation Rhythm:regular Rate:Normal  Hx/o Aortic Dissection repaired 2012   Neuro/Psych    GI/Hepatic GERD-  ,  Endo/Other  Hypothyroidism Morbid obesity  Renal/GU Renal InsufficiencyRenal disease     Musculoskeletal negative musculoskeletal ROS (+)   Abdominal   Peds  Hematology On coumadin for hx/o paroxysmal A.Fib- last dose 5 days ago.   Anesthesia Other Findings Off coumadin since 12/28 No difficulty with previous GA  Reproductive/Obstetrics negative OB ROS                           Anesthesia Physical Anesthesia Plan  ASA: III  Anesthesia Plan:    Post-op Pain Management:    Induction: Intravenous  Airway Management Planned: LMA  Additional Equipment:   Intra-op Plan:   Post-operative Plan: Extubation in OR  Informed Consent: I have reviewed the patients History and Physical, chart, labs and discussed the procedure including the risks, benefits and alternatives for the proposed anesthesia with the patient or authorized representative who has indicated his/her understanding and acceptance.   Dental Advisory Given and Dental advisory given  Plan Discussed with: CRNA, Surgeon and Anesthesiologist  Anesthesia Plan Comments: (Discussed GA with LMA, possible sore throat, potential need to switch to ETT, N/V, pulmonary aspiration. Questions  answered. )       Anesthesia Quick Evaluation

## 2014-04-13 NOTE — Transfer of Care (Signed)
Immediate Anesthesia Transfer of Care Note  Patient: Breanna Hernandez  Procedure(s) Performed: Procedure(s) with comments: DILATATION AND CURETTAGE /HYSTEROSCOPY Polypectomy (N/A) - Possible Polypectomy  Patient Location: PACU  Anesthesia Type:General  Level of Consciousness: awake, alert  and oriented  Airway & Oxygen Therapy: Patient Spontanous Breathing and Patient connected to nasal cannula oxygen  Post-op Assessment: Report given to PACU RN and Post -op Vital signs reviewed and stable  Post vital signs: Reviewed and stable  Complications: No apparent anesthesia complications

## 2014-04-13 NOTE — H&P (Signed)
Reason for Appointment  1. PreOp for 04/13/14   History of Present Illness  General:  69 y/o with episode of postmenopausal bleeding presents for history and physical in preparation for hysteroscopy D&C possible polypectomy. She is on Coumadin for h/o aortic dissection. Dr. Sharyn Lull at [facility] vascular her anticoagulation.  Her PCP is Dr. Zenaida Deed in [place] at [facility] health care.  Her ultrasound shows an 8.0cm x 4.54 cm x 4.16 cm uterus with a thickened endometrium 1.89 cm. The is a questionable hyperechoic mass 1.9 x 1.4 cm in the endometrium. no blood flow is noted. Both ovaries appear normal.    Current Medications  Taking   HCTZ 12.5 MG Tablet 1 tablet q AM   Synthroid(L-Thyroxine Sodium) 50 MCG Tablet 1 tablet every morning on an empty stomach Once a day   Amlodipine Besylate 2.5 MG Tablet 1 tablet Once a day   Benazepril HCl 40 MG Tablet 1 tablet Once a day   Metoprolol Tartrate 50 MG Tablet 1 tablet Twice a day   Baby Aspirin(Aspirin)   Warfarin Sodium 4 MG Tablet 1 tablet Once a day   Medication List reviewed and reconciled with the patient    Past Medical History  Breast cancer metachronous left 2007 and right as below.  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   No family history of breast, colon, prostate or ovarian cancer.   Social History  General:  Tobacco use cigarettes: Never smoked, Tobacco history last updated 03/02/2014. no Smoking. no Alcohol. no Caffeine. no Recreational drug use. no Exercise. Occupation: unemployed. Marital Status: married. Children: 6 children.    Gyn History  Sexual activity currently sexually active.  Periods : postmenopausal.  Denies H/O  LMP 10/2001.  Denies H/O Birth control.  Last pap smear date 02/19/14.  Last mammogram date June 2015.  Abnormal pap smear ? possible CIN 1 ASCUS 2001 Nl since 2004.  STD none.  Menarche 39.  Other: PID as a result of an IUD.    OB History  Number of pregnancies 6.  Pregnancy # 1 live birth, vaginal delivery, boy.  Pregnancy # 2 live birth, vaginal delivery, boy.  Pregnancy # 3 live birth, vaginal delivery, boy.  Pregnancy # 4: live birth, vaginal delivery, girl.  Pregnancy # 5: Live Birth, vaginal delivery, Girl.  Pregnancy # 6 live birth, vaginal delivery, boy.    Allergies  N.K.D.A.   Hospitalization/Major Diagnostic Procedure  cholecystectomy 1969  Lumpectomy 11/1998  Hand surgery 07/2000  childbirth x 6    Review of Systems  Negative except as stated in hPI.      Vital Signs  Wt 246, Wt change 2 lb, Ht 65, BMI 40.93, Temp 97, Pulse sitting 64, BP sitting 118/57.   Examination  General Examination: GENERAL APPEARANCE alert, oriented, NAD. LUNGS: clear to auscultation bilaterally. HEART: regular rate and rhythm. ABDOMEN: soft, non-tender/non-distended, bowel sounds present. FEMALE GENITOURINARY: normal external genitalia, labia - unremarkable, vagina - pink moist mucosa, no lesions or abnormal discharge, cervix - no discharge or lesions or CMT, adnexa - no masses or tenderness, uterus - nontender and normal size on palpation. EXTREMITIES: no edema present.     Assessments  1. Postmenopausal bleeding - N95.0 (Primary)   2. Thickened endometrium - R93.8   Treatment  1. Postmenopausal bleeding  Notes: pt was offered em biopsy in the office vs . hystereoscopy D&C possible polypectomy. she has opted for hysteroscopy D&C . rb/a of surgery discussed with the patient including but not limited to infection bleeding perforation of the uterus with the need for further surgery. pt voiced understanding and desires to proceed. She will discontinue her coumadin 5 days prior to  surgery.. she has been cleared for surgery by her cardiologist.    Follow Up  3 Weeks (Reason: postoperative visit)

## 2014-04-13 NOTE — Anesthesia Postprocedure Evaluation (Signed)
  Anesthesia Post-op Note  Patient: Breanna Hernandez  Procedure(s) Performed: Procedure(s) with comments: DILATATION AND CURETTAGE /HYSTEROSCOPY Polypectomy (N/A) - Possible Polypectomy Patient is awake and responsive. Pain and nausea are reasonably well controlled. Vital signs are stable and clinically acceptable. Oxygen saturation is clinically acceptable. There are no apparent anesthetic complications at this time. Patient is ready for discharge.

## 2014-04-13 NOTE — Anesthesia Procedure Notes (Signed)
Procedure Name: LMA Insertion Date/Time: 04/13/2014 2:25 PM Performed by: Flossie Dibble Pre-anesthesia Checklist: Patient identified, Patient being monitored, Timeout performed, Emergency Drugs available and Suction available Patient Re-evaluated:Patient Re-evaluated prior to inductionOxygen Delivery Method: Circle system utilized Preoxygenation: Pre-oxygenation with 100% oxygen Intubation Type: IV induction LMA: LMA inserted LMA Size: 4.0 Number of attempts: 1 Placement Confirmation: breath sounds checked- equal and bilateral and positive ETCO2 Tube secured with: Tape Dental Injury: Teeth and Oropharynx as per pre-operative assessment

## 2014-04-14 ENCOUNTER — Encounter (HOSPITAL_COMMUNITY): Payer: Self-pay | Admitting: Obstetrics and Gynecology

## 2014-04-26 ENCOUNTER — Ambulatory Visit: Payer: Medicare Other | Attending: Gynecologic Oncology | Admitting: Gynecologic Oncology

## 2014-04-26 ENCOUNTER — Encounter: Payer: Self-pay | Admitting: Gynecologic Oncology

## 2014-04-26 VITALS — BP 179/67 | HR 60 | Temp 97.9°F | Resp 18 | Ht 64.5 in | Wt 245.7 lb

## 2014-04-26 DIAGNOSIS — I10 Essential (primary) hypertension: Secondary | ICD-10-CM | POA: Insufficient documentation

## 2014-04-26 DIAGNOSIS — C541 Malignant neoplasm of endometrium: Secondary | ICD-10-CM | POA: Diagnosis not present

## 2014-04-26 DIAGNOSIS — Z923 Personal history of irradiation: Secondary | ICD-10-CM | POA: Diagnosis not present

## 2014-04-26 DIAGNOSIS — Z853 Personal history of malignant neoplasm of breast: Secondary | ICD-10-CM | POA: Insufficient documentation

## 2014-04-26 DIAGNOSIS — Z8542 Personal history of malignant neoplasm of other parts of uterus: Secondary | ICD-10-CM | POA: Insufficient documentation

## 2014-04-26 DIAGNOSIS — Z9049 Acquired absence of other specified parts of digestive tract: Secondary | ICD-10-CM | POA: Insufficient documentation

## 2014-04-26 DIAGNOSIS — Z7982 Long term (current) use of aspirin: Secondary | ICD-10-CM | POA: Diagnosis not present

## 2014-04-26 DIAGNOSIS — Z79899 Other long term (current) drug therapy: Secondary | ICD-10-CM | POA: Insufficient documentation

## 2014-04-26 DIAGNOSIS — Z7901 Long term (current) use of anticoagulants: Secondary | ICD-10-CM | POA: Insufficient documentation

## 2014-04-26 DIAGNOSIS — G629 Polyneuropathy, unspecified: Secondary | ICD-10-CM

## 2014-04-26 DIAGNOSIS — Z8679 Personal history of other diseases of the circulatory system: Secondary | ICD-10-CM | POA: Diagnosis not present

## 2014-04-26 NOTE — Progress Notes (Signed)
Consult Note: Gyn-Onc  Consult was requested by Dr. Landry Mellow for the evaluation of Yasuko Lapage 69 y.o. female with grade 2 endometrial cancer  CC:  Chief Complaint  Patient presents with  . endometrial cancer    Assessment/Plan:  Ms. Estle Sabella  is a 69 y.o.  year old woman with grade 2 endometrial cancer.   A detailed discussion was held with the patient and her family with regard to to her endometrial cancer diagnosis. We discussed the standard management options for uterine cancer which includes surgery followed possibly by adjuvant therapy depending on the results of surgery. The options for surgical management include a hysterectomy and removal of the tubes and ovaries possibly with removal of pelvic and para-aortic lymph nodes. A minimally invasive approach including a robotic hysterectomy or laparoscopic hysterectomy have benefits including shorter hospital stay, recovery time and better wound healing. The alternative approach is an open hysterectomy. The patient has been counseled about these surgical options and the risks of surgery in general including infection, bleeding, damage to surrounding structures (including bowel, bladder, ureters, nerves or vessels), and the postoperative risks of PE/ DVT, and lymphedema. I extensively reviewed the additional risks of robotic hysterectomy including possible need for conversion to open laparotomy.  I discussed positioning during surgery of trendelenberg and risks of minor facial swelling and care we take in preoperative positioning. I discussed alternatives to complete lymphadenectomy including sentinel lymph node mapping and discussed that SLN mapping is associated with a 0.4% false negative rate and a potential for the reduction in postoperative morbidity.  After counseling and consideration of her options, she desires to proceed with robotic hysterectomy, BSO, and sentinel lymph node mapping.    She takes Coumadin daily for a history of an  aortic dissection which was repaired in January 2012. She reports that she was taken off this Coumadin for her D&C procedure. I discussed the increased risk for bleeding associated with perioperative anticoagulation use, and recommend discontinuing Coumadin 7 days preoperatively with an INR/PT check on the day before surgery. I will discuss her case with her cardiologist to ensure that this is safe and that she does not require bridging with Lovenox preoperatively. We will restart the Coumadin on postoperative day 1 if her bleeding risk is deemed to low by intraoperative findings.  She will be seen by anesthesia for preoperative clearance and discussion of postoperative pain management.  She was given the opportunity to ask questions, which were answered to her satisfaction, and she is agreement with the above mentioned plan of care.  HPI: Caitland is a 69 year old gravida 6 para 6 who is seen in consultation at the request of Dr. Landry Mellow for grade 2 endometrioid endometrial adenocarcinoma she reports a history of postmenopausal bleeding since 02/07/2014. It is light and only spotting. This prompted evaluation by her Dr., Dr. Landry Mellow, who performed a transvaginal ultrasound which revealed a uterus measuring 4 x 8 x 4 cm, with a 1.9 cm endometrial stripe. Subsequently she underwent a D&C on 04/13/2014 which revealed endometrioid carcinoma endometrioid cell type with squamous differentiation grade 2.  The patient has a personal history of breast cancer, bilateral, in 2005 and 2007. She's been treated with surgery and radiation for this and has not received chemotherapy or tamoxifen. She also has a history of peripheral neuropathy of unknown etiology. It affects her lower extremities and makes it difficult for her to walk. She has difficulty with balance.  Interval History: she continues to have light vaginal spotting.  Current Meds:  Outpatient Encounter Prescriptions as of 04/26/2014  Medication Sig  .  amLODipine (NORVASC) 5 MG tablet TAKE ONE TABLET BY MOUTH ONCE DAILY  . aspirin 81 MG tablet Take 81 mg by mouth daily.    . benazepril (LOTENSIN) 40 MG tablet Take 1 tablet (40 mg total) by mouth daily.  Marland Kitchen CALCIUM-MAGNESIUM-VITAMIN D PO Take 1 tablet by mouth daily.   . fish oil-omega-3 fatty acids 1000 MG capsule Take 1 g by mouth 2 (two) times daily.   . hydrochlorothiazide (MICROZIDE) 12.5 MG capsule TAKE ONE CAPSULE BY MOUTH ONCE DAILY  . HYDROcodone-acetaminophen (NORCO) 5-325 MG per tablet Take 1-2 tablets by mouth every 6 (six) hours as needed for moderate pain.  . hydrocortisone 2.5 % cream Apply 1 application topically as needed.   Marland Kitchen ketoconazole (NIZORAL) 2 % cream Apply 1 application topically daily as needed.   Marland Kitchen levothyroxine (SYNTHROID, LEVOTHROID) 75 MCG tablet Take 1 tablet (75 mcg total) by mouth daily before breakfast.  . LORazepam (ATIVAN) 1 MG tablet 1 by mouth " as needed" and should not be taken on a regular basis. Regular use can increase risk of addiction but more importantly affect level of alertness and balance with increased risk of falling. APPOINTMENT DUE (Patient taking differently: Take 1 mg by mouth at bedtime as needed for sleep. 1 by mouth " as needed" and should not be taken on a regular basis. Regular use can increase risk of addiction but more importantly affect level of alertness and balance with increased risk of falling. APPOINTMENT DUE)  . metoprolol (LOPRESSOR) 50 MG tablet Take 1 tablet (50 mg total) by mouth 2 (two) times daily.  Marland Kitchen warfarin (COUMADIN) 4 MG tablet Take 4-6 mg by mouth daily. 4 mg every day except Tuesdays, Thursdays and Saturdays and takes 6 mg on those days  . [DISCONTINUED] warfarin (COUMADIN) 4 MG tablet TAKE 1 AND 1/2 TABLETS BY MOUTH ONCE DAILY OR AS DIRECTED (Patient not taking: Reported on 04/13/2014)    Allergy: No Known Allergies  Social Hx:   History   Social History  . Marital Status: Married    Spouse Name: N/A    Number  of Children: N/A  . Years of Education: N/A   Occupational History  . Not on file.   Social History Main Topics  . Smoking status: Never Smoker   . Smokeless tobacco: Never Used  . Alcohol Use: No  . Drug Use: No  . Sexual Activity: Yes   Other Topics Concern  . Not on file   Social History Narrative    Past Surgical Hx:  Past Surgical History  Procedure Laterality Date  . Cholecystectomy    . Tonsillectomy    . Colonoscopy w/ polypectomy    . Breast lumpectomy  2000    Right x2, Left x1  . Median sternotomy, extracorporeal circulation, repair of type 1 aortic dissection with  tube graft from  sinotubular junction to take off of innominate artery, distal anastomoses under deep bypothermic circulatory arrest  05/03/2010    Dr Roxan Hockey  . Cabg x 1  05/03/2010    RCA -Dr Roxan Hockey (with type 1 aortic dissection)  . Hysteroscopy w/d&c N/A 04/13/2014    Procedure: DILATATION AND CURETTAGE /HYSTEROSCOPY Polypectomy;  Surgeon: Maeola Sarah. Landry Mellow, MD;  Location: [facility];  Service: Gynecology;  Laterality: N/A;  Possible Polypectomy    Past Medical Hx:  Past Medical History  Diagnosis Date  . HTN (hypertension)   . Hypothyroidism   . Breast  cancer     x 3  . 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 Diverticulosis   . Hyperplastic colon polyp   . Hyperlipidemia   . Obesity   . IBS (irritable bowel syndrome)   . Atrial fibrillation     h/o post-op 04/2010  . Renal cyst, left   . Fatty liver 01/10/07  . Lower extremity neuropathy     Past Gynecological History:  SVD x 6  No LMP recorded. Patient is postmenopausal.  Family Hx:  Family History  Problem Relation Age of Onset  . Aneurysm Father     CNS  . Heart attack Father     x2  . Alzheimer's disease Mother   . Hypertension Mother   . Hypertension Brother   . Goiter Mother   . Colon cancer Neg Hx     Review of Systems:  Constitutional  Feels well,    ENT Normal appearing ears and nares  bilaterally Skin/Breast  No rash, sores, jaundice, itching, dryness Cardiovascular  No chest pain, shortness of breath, or edema  Pulmonary  No cough or wheeze.  Gastro Intestinal  No nausea, vomitting, or diarrhoea. No bright red blood per rectum, no abdominal pain, change in bowel movement, or constipation.  Genito Urinary  No frequency, urgency, dysuria, see HPI Musculo Skeletal  No myalgia, arthralgia, joint swelling or pain  Neurologic  No weakness, numbness, change in gait,  Psychology  No depression, anxiety, insomnia.   Vitals:  Blood pressure 179/67, pulse 60, temperature 97.9 F (36.6 C), temperature source Oral, resp. rate 18, height 5' 4.5" (1.638 m), weight 245 lb 11.2 oz (111.449 kg).  Physical Exam: WD in NAD Neck  Supple NROM, without any enlargements.  Lymph Node Survey No cervical supraclavicular or inguinal adenopathy Cardiovascular  Pulse normal rate, regularity and rhythm. S1 and S2 normal.  Lungs  Clear to auscultation bilateraly, without wheezes/crackles/rhonchi. Good air movement.  Skin  No rash/lesions/breakdown  Psychiatry  Alert and oriented to person, place, and time  Abdomen  Normoactive bowel sounds, abdomen soft, non-tender and obese without evidence of hernia. Vertical midline incision from cholecystectomy Back No CVA tenderness Genito Urinary  Vulva/vagina: Normal external female genitalia.  No lesions. No discharge or bleeding.  Bladder/urethra:  No lesions or masses, well supported bladder  Vagina: normal in appearance  Cervix: Normal appearing, no lesions.  Uterus:  Small, mobile, no parametrial involvement or nodularity.  Adnexa: no palpable masses. Rectal  Good tone, no masses no cul de sac nodularity. Hemorrhoid present. Extremities  No bilateral cyanosis, clubbing or edema.   Donaciano Eva, MD   04/26/2014, 3:43 PM

## 2014-04-26 NOTE — Patient Instructions (Signed)
Preparing for your Surgery  Plan for surgery on February 9 with Dr. Denman George.  STOP COUMADIN 7 DAYS PRIOR TO SURGERY. WE WILL CONTACT YOUR CARDIOLOGIST AND LET YOU KNOW IF THEY HAVE ADDITIONAL INSTRUCTIONS.  Pre-operative Testing -You will receive a phone call from presurgical testing at [facility] to arrange for a pre-operative testing appointment before your surgery.  This appointment normally occurs one to two weeks before your scheduled surgery.   -Bring your insurance card, copy of an advanced directive if applicable, medication list  -At that visit, you will be asked to sign a consent for a possible blood transfusion in case a transfusion becomes necessary during surgery.  The need for a blood transfusion is rare but having consent is a necessary part of your care.     -You should not be taking blood thinners or aspirin at least ten days prior to surgery unless instructed by your surgeon.  Day Before Surgery at [facility] will be asked to take in only clear liquids the day before surgery.  Examples of clear liquids include broths, jello, and clear juices.  You will be advised to have nothing to eat or drink after midnight the evening before.    Your role in recovery Your role is to become active as soon as directed by your doctor, while still giving yourself time to heal.  Rest when you feel tired. You will be asked to do the following in order to speed your recovery:  - Cough and breathe deeply. This helps toclear and expand your lungs and can prevent pneumonia. You may be given a spirometer to practice deep breathing. A staff member will show you how to use the spirometer. - Do mild physical activity. Walking or moving your legs help your circulation and body functions return to normal. A staff member will help you when you try to walk and will provide you with simple exercises. Do not try to get up or walk alone the first time. - Actively manage your pain. Managing  your pain lets you move in comfort. We will ask you to rate your pain on a scale of zero to 10. It is your responsibility to tell your doctor or nurse where and how much you hurt so your pain can be treated.  Special Considerations -If you are diabetic, you may be placed on insulin after surgery to have closer control over your blood sugars to promote healing and recovery.  This does not mean that you will be discharged on insulin.  If applicable, your oral antidiabetics will be resumed when you are tolerating a solid diet.  -Your final pathology results from surgery should be available by the Friday after surgery and the results will be relayed to you when available.  Blood Transfusion Information WHAT IS A BLOOD TRANSFUSION? A transfusion is the replacement of blood or some of its parts. Blood is made up of multiple cells which provide different functions.  Red blood cells carry oxygen and are used for blood loss replacement.  White blood cells fight against infection.  Platelets control bleeding.  Plasma helps clot blood.  Other blood products are available for specialized needs, such as hemophilia or other clotting disorders. BEFORE THE TRANSFUSION  Who gives blood for transfusions?   You may be able to donate blood to be used at a later date on yourself (autologous donation).  Relatives can be asked to donate blood. This is generally not any safer than if you have received blood from  a stranger. The same precautions are taken to ensure safety when a relative's blood is donated.  Healthy volunteers who are fully evaluated to make sure their blood is safe. This is blood bank blood. Transfusion therapy is the safest it has ever been in the practice of medicine. Before blood is taken from a donor, a complete history is taken to make sure that person has no history of diseases nor engages in risky social behavior (examples are intravenous drug use or sexual activity with multiple  partners). The donor's travel history is screened to minimize risk of transmitting infections, such as malaria. The donated blood is tested for signs of infectious diseases, such as HIV and hepatitis. The blood is then tested to be sure it is compatible with you in order to minimize the chance of a transfusion reaction. If you or a relative donates blood, this is often done in anticipation of surgery and is not appropriate for emergency situations. It takes many days to process the donated blood. RISKS AND COMPLICATIONS Although transfusion therapy is very safe and saves many lives, the main dangers of transfusion include:   Getting an infectious disease.  Developing a transfusion reaction. This is an allergic reaction to something in the blood you were given. Every precaution is taken to prevent this. The decision to have a blood transfusion has been considered carefully by your caregiver before blood is given. Blood is not given unless the benefits outweigh the risks.

## 2014-04-28 ENCOUNTER — Ambulatory Visit (INDEPENDENT_AMBULATORY_CARE_PROVIDER_SITE_OTHER): Payer: Medicare Other | Admitting: Pharmacist Clinician (PhC)/ Clinical Pharmacy Specialist

## 2014-04-28 DIAGNOSIS — Z7901 Long term (current) use of anticoagulants: Secondary | ICD-10-CM

## 2014-04-28 DIAGNOSIS — I4891 Unspecified atrial fibrillation: Secondary | ICD-10-CM

## 2014-04-28 LAB — POCT INR: INR: 2.4

## 2014-05-11 ENCOUNTER — Telehealth: Payer: Self-pay | Admitting: *Deleted

## 2014-05-11 NOTE — Telephone Encounter (Signed)
Request for new Rx for Lorazepam to be faxed to [facility] mail order.  This was denied by Dr. Loletha Grayer and instructed they send this to her PCP.

## 2014-05-13 ENCOUNTER — Encounter (HOSPITAL_COMMUNITY)
Admission: RE | Admit: 2014-05-13 | Discharge: 2014-05-13 | Disposition: A | Discharge status: Home / Self Care | Payer: Medicare Other | Source: Ambulatory Visit | Attending: Gynecologic Oncology | Admitting: Gynecologic Oncology

## 2014-05-13 ENCOUNTER — Ambulatory Visit (HOSPITAL_COMMUNITY)
Admission: RE | Admit: 2014-05-13 | Discharge: 2014-05-13 | Disposition: A | Discharge status: Home / Self Care | Payer: Medicare Other | Source: Ambulatory Visit | Attending: Gynecologic Oncology | Admitting: Gynecologic Oncology

## 2014-05-13 ENCOUNTER — Encounter (HOSPITAL_COMMUNITY): Payer: Self-pay

## 2014-05-13 DIAGNOSIS — C541 Malignant neoplasm of endometrium: Secondary | ICD-10-CM | POA: Insufficient documentation

## 2014-05-13 DIAGNOSIS — Z0183 Encounter for blood typing: Secondary | ICD-10-CM | POA: Diagnosis not present

## 2014-05-13 DIAGNOSIS — E039 Hypothyroidism, unspecified: Secondary | ICD-10-CM | POA: Insufficient documentation

## 2014-05-13 DIAGNOSIS — Z853 Personal history of malignant neoplasm of breast: Secondary | ICD-10-CM | POA: Insufficient documentation

## 2014-05-13 DIAGNOSIS — Z79899 Other long term (current) drug therapy: Secondary | ICD-10-CM | POA: Insufficient documentation

## 2014-05-13 DIAGNOSIS — G629 Polyneuropathy, unspecified: Secondary | ICD-10-CM | POA: Insufficient documentation

## 2014-05-13 DIAGNOSIS — I10 Essential (primary) hypertension: Secondary | ICD-10-CM | POA: Diagnosis not present

## 2014-05-13 DIAGNOSIS — Z01818 Encounter for other preprocedural examination: Secondary | ICD-10-CM | POA: Diagnosis not present

## 2014-05-13 DIAGNOSIS — Z7982 Long term (current) use of aspirin: Secondary | ICD-10-CM | POA: Diagnosis not present

## 2014-05-13 DIAGNOSIS — Z01812 Encounter for preprocedural laboratory examination: Secondary | ICD-10-CM | POA: Insufficient documentation

## 2014-05-13 DIAGNOSIS — Z951 Presence of aortocoronary bypass graft: Secondary | ICD-10-CM | POA: Diagnosis not present

## 2014-05-13 HISTORY — DX: Cardiac arrhythmia, unspecified: I49.9

## 2014-05-13 LAB — CBC WITH DIFFERENTIAL/PLATELET
Basophils Absolute: 0 10*3/uL (ref 0.0–0.1)
Basophils Relative: 0 % (ref 0–1)
Eosinophils Absolute: 0 10*3/uL (ref 0.0–0.7)
Eosinophils Relative: 0 % (ref 0–5)
HEMATOCRIT: 39.1 % (ref 36.0–46.0)
HEMOGLOBIN: 13.2 g/dL (ref 12.0–15.0)
Lymphocytes Relative: 28 % (ref 12–46)
Lymphs Abs: 1.9 10*3/uL (ref 0.7–4.0)
MCH: 33.4 pg (ref 26.0–34.0)
MCHC: 33.8 g/dL (ref 30.0–36.0)
MCV: 99 fL (ref 78.0–100.0)
Monocytes Absolute: 0.7 10*3/uL (ref 0.1–1.0)
Monocytes Relative: 10 % (ref 3–12)
NEUTROS ABS: 4.2 10*3/uL (ref 1.7–7.7)
Neutrophils Relative %: 62 % (ref 43–77)
PLATELETS: 213 10*3/uL (ref 150–400)
RBC: 3.95 MIL/uL (ref 3.87–5.11)
RDW: 12.4 % (ref 11.5–15.5)
WBC: 6.9 10*3/uL (ref 4.0–10.5)

## 2014-05-13 LAB — COMPREHENSIVE METABOLIC PANEL
ALBUMIN: 4 g/dL (ref 3.5–5.2)
ALK PHOS: 86 U/L (ref 39–117)
ALT: 33 U/L (ref 0–35)
AST: 39 U/L — AB (ref 0–37)
Anion gap: 8 (ref 5–15)
BUN: 20 mg/dL (ref 6–23)
CO2: 28 mmol/L (ref 19–32)
Calcium: 9.4 mg/dL (ref 8.4–10.5)
Chloride: 99 mmol/L (ref 96–112)
Creatinine, Ser: 0.86 mg/dL (ref 0.50–1.10)
GFR calc non Af Amer: 67 mL/min — ABNORMAL LOW (ref >90)
GFR, EST AFRICAN AMERICAN: 78 mL/min — AB (ref >90)
Glucose, Bld: 89 mg/dL (ref 70–99)
Potassium: 4 mmol/L (ref 3.5–5.1)
Sodium: 135 mmol/L (ref 135–145)
Total Bilirubin: 1.4 mg/dL — ABNORMAL HIGH (ref 0.3–1.2)
Total Protein: 7.5 g/dL (ref 6.0–8.3)

## 2014-05-13 LAB — URINALYSIS, ROUTINE W REFLEX MICROSCOPIC
Bilirubin Urine: NEGATIVE
Glucose, UA: NEGATIVE mg/dL
Hgb urine dipstick: NEGATIVE
KETONES UR: NEGATIVE mg/dL
Leukocytes, UA: NEGATIVE
Nitrite: NEGATIVE
PROTEIN: NEGATIVE mg/dL
Specific Gravity, Urine: 1.019 (ref 1.005–1.030)
Urobilinogen, UA: 0.2 mg/dL (ref 0.0–1.0)
pH: 5 (ref 5.0–8.0)

## 2014-05-13 LAB — PROTIME-INR
INR: 1.48 (ref 0.00–1.49)
Prothrombin Time: 18.1 seconds — ABNORMAL HIGH (ref 11.6–15.2)

## 2014-05-13 LAB — ABO/RH: ABO/RH(D): B POS

## 2014-05-13 NOTE — Pre-Procedure Instructions (Signed)
05-13-14 1400 Dr. Marcell Barlow given update pt history-" will see AM of surgery"-aware of Coumadin use-stopped x 7 days- will receive Lovenox injection AM of preop per surgeon order.

## 2014-05-13 NOTE — Patient Instructions (Addendum)
[place]  05/13/2014   Your procedure is scheduled on:   05-18-2014 Tuesday  Enter through [facility]  Entrance and follow signs to [facility]. Arrive at    0800    AM ..  Call this number if you have problems the morning of surgery: [phone]  Or Presurgical Testing [phone].   For Living Will and/or Health Care Power Attorney Forms: please provide copy for your medical record,may bring AM of surgery(Forms should be already notarized -we do not provide this service).(05-13-14 Yes/ No information preferred today).  Remember: Follow any bowel prep instructions per MD office.(Clear liquids x 24 hour preop)    Do not eat food/ or drink: After Midnight.      Take these medicines the morning of surgery with A SIP OF WATER: Amlodipine(if takes AM), Levothyroxine. Metoprolol. Hydrocodone(if need)   Do not wear jewelry, make-up or nail polish.  Do not wear deodorant, lotions, powders, or perfumes.   Do not shave legs and under arms- 48 hours(2 days) prior to first CHG shower.(Shaving face and neck okay.)  Do not bring valuables to the hospital.(Hospital is not responsible for lost valuables).  Contacts, dentures or removable bridgework, body piercing, hair pins may not be worn into surgery.  Leave suitcase in the car. After surgery it may be brought to your room.  For patients admitted to the hospital, checkout time is 11:00 AM the day of discharge.(Restricted visitors-Any Persons displaying flu-like symptoms or illness).    Patients discharged the day of surgery will not be allowed to drive home. Must have responsible person with you x 24 hours once discharged.  Name and phone number of your driver: Sonia Side -spouse [phone] cell     Please read over the following fact sheets that you were given:  CHG(Chlorhexidine Gluconate 4% Surgical Soap) use, MRSA Information, Blood Transfusion fact sheet, Incentive Spirometry Instruction.  Remember : Type/Screen "Blue armbands" -  may not be removed once applied(would result in being retested AM of surgery, if removed).         [place] - Preparing for Surgery Before surgery, you can play an important role.  Because skin is not sterile, your skin needs to be as free of germs as possible.  You can reduce the number of germs on your skin by washing with CHG (chlorahexidine gluconate) soap before surgery.  CHG is an antiseptic cleaner which kills germs and bonds with the skin to continue killing germs even after washing. Please DO NOT use if you have an allergy to CHG or antibacterial soaps.  If your skin becomes reddened/irritated stop using the CHG and inform your nurse when you arrive at Short Stay. Do not shave (including legs and underarms) for at least 48 hours prior to the first CHG shower.  You may shave your face/neck. Please follow these instructions carefully:  1.  Shower with CHG Soap the night before surgery and the  morning of Surgery.  2.  If you choose to wash your hair, wash your hair first as usual with your  normal  shampoo.  3.  After you shampoo, rinse your hair and body thoroughly to remove the  shampoo.                           4.  Use CHG as you would any other liquid soap.  You can apply chg directly  to the skin and wash  Gently with a scrungie or clean washcloth.  5.  Apply the CHG Soap to your body ONLY FROM THE NECK DOWN.   Do not use on face/ open                           Wound or open sores. Avoid contact with eyes, ears mouth and genitals (private parts).                       Wash face,  Genitals (private parts) with your normal soap.             6.  Wash thoroughly, paying special attention to the area where your surgery  will be performed.  7.  Thoroughly rinse your body with warm water from the neck down.  8.  DO NOT shower/wash with your normal soap after using and rinsing off  the CHG Soap.                9.  Pat yourself dry with a clean towel.            10.   Wear clean pajamas.            11.  Place clean sheets on your bed the night of your first shower and do not  sleep with pets. Day of Surgery : Do not apply any lotions/deodorants the morning of surgery.  Please wear clean clothes to the hospital/surgery center.  FAILURE TO FOLLOW THESE INSTRUCTIONS MAY RESULT IN THE CANCELLATION OF YOUR SURGERY PATIENT SIGNATURE_________________________________  NURSE SIGNATURE__________________________________  ________________________________________________________________________    CLEAR LIQUID DIET   Foods Allowed                                                                     Foods Excluded  Coffee and tea, regular and decaf                             liquids that you cannot  Plain Jell-O in any flavor                                             see through such as: Fruit ices (not with fruit pulp)                                     milk, soups, orange juice  Iced Popsicles                                    All solid food Carbonated beverages, regular and diet                                    Cranberry, grape and apple juices Sports drinks like Gatorade Lightly seasoned clear broth or consume(fat free) Sugar, honey syrup  Sample Menu Breakfast                                Lunch                                     Supper Cranberry juice                    Beef broth                            Chicken broth Jell-O                                     Grape juice                           Apple juice Coffee or tea                        Jell-O                                      Popsicle                                                Coffee or tea                        Coffee or tea  _____________________________________________________________________    Incentive Spirometer  An incentive spirometer is a tool that can help keep your lungs clear and active. This tool measures how well you are filling your lungs with each  breath. Taking long deep breaths may help reverse or decrease the chance of developing breathing (pulmonary) problems (especially infection) following:  A long period of time when you are unable to move or be active. BEFORE THE PROCEDURE   If the spirometer includes an indicator to show your best effort, your nurse or respiratory therapist will set it to a desired goal.  If possible, sit up straight or lean slightly forward. Try not to slouch.  Hold the incentive spirometer in an upright position. INSTRUCTIONS FOR USE   Sit on the edge of your bed if possible, or sit up as far as you can in bed or on a chair.  Hold the incentive spirometer in an upright position.  Breathe out normally.  Place the mouthpiece in your mouth and seal your lips tightly around it.  Breathe in slowly and as deeply as possible, raising the piston or the ball toward the top of the column.  Hold your breath for 3-5 seconds or for as long as possible. Allow the piston or ball to fall to the bottom of the column.  Remove the mouthpiece from your mouth and breathe out normally.  Rest for a few seconds and repeat Steps 1 through 7 at least 10 times every 1-2 hours when you are awake. Take your time and take a few normal breaths between  deep breaths.  The spirometer may include an indicator to show your best effort. Use the indicator as a goal to work toward during each repetition.  After each set of 10 deep breaths, practice coughing to be sure your lungs are clear. If you have an incision (the cut made at the time of surgery), support your incision when coughing by placing a pillow or rolled up towels firmly against it. Once you are able to get out of bed, walk around indoors and cough well. You may stop using the incentive spirometer when instructed by your caregiver.  RISKS AND COMPLICATIONS  Take your time so you do not get dizzy or light-headed.  If you are in pain, you may need to take or ask for pain  medication before doing incentive spirometry. It is harder to take a deep breath if you are having pain. AFTER USE  Rest and breathe slowly and easily.  It can be helpful to keep track of a log of your progress. Your caregiver can provide you with a simple table to help with this. If you are using the spirometer at home, follow these instructions: [place] IF:   You are having difficultly using the spirometer.  You have trouble using the spirometer as often as instructed.  Your pain medication is not giving enough relief while using the spirometer.  You develop fever of 100.5 F (38.1 C) or higher. SEEK IMMEDIATE MEDICAL CARE IF:   You cough up bloody sputum that had not been present before.  You develop fever of 102 F (38.9 C) or greater.  You develop worsening pain at or near the incision site. MAKE SURE YOU:   Understand these instructions.  Will watch your condition.  Will get help right away if you are not doing well or get worse. Document Released: 08/06/2006 Document Revised: 06/18/2011 Document Reviewed: 10/07/2006 Exit[employer] Patient Information 2014 [employer], [place].   ________________________________________________________________________  WHAT IS A BLOOD TRANSFUSION? Blood Transfusion Information  A transfusion is the replacement of blood or some of its parts. Blood is made up of multiple cells which provide different functions.  Red blood cells carry oxygen and are used for blood loss replacement.  White blood cells fight against infection.  Platelets control bleeding.  Plasma helps clot blood.  Other blood products are available for specialized needs, such as hemophilia or other clotting disorders. BEFORE THE TRANSFUSION  Who gives blood for transfusions?   Healthy volunteers who are fully evaluated to make sure their blood is safe. This is blood bank blood. Transfusion therapy is the safest it has ever been in the practice of medicine.  Before blood is taken from a donor, a complete history is taken to make sure that person has no history of diseases nor engages in risky social behavior (examples are intravenous drug use or sexual activity with multiple partners). The donor's travel history is screened to minimize risk of transmitting infections, such as malaria. The donated blood is tested for signs of infectious diseases, such as HIV and hepatitis. The blood is then tested to be sure it is compatible with you in order to minimize the chance of a transfusion reaction. If you or a relative donates blood, this is often done in anticipation of surgery and is not appropriate for emergency situations. It takes many days to process the donated blood. RISKS AND COMPLICATIONS Although transfusion therapy is very safe and saves many lives, the main dangers of transfusion include:   Getting an infectious disease.  Developing a transfusion reaction. This is an allergic reaction to something in the blood you were given. Every precaution is taken to prevent this. The decision to have a blood transfusion has been considered carefully by your caregiver before blood is given. Blood is not given unless the benefits outweigh the risks. AFTER THE TRANSFUSION  Right after receiving a blood transfusion, you will usually feel much better and more energetic. This is especially true if your red blood cells have gotten low (anemic). The transfusion raises the level of the red blood cells which carry oxygen, and this usually causes an energy increase.  The nurse administering the transfusion will monitor you carefully for complications. HOME CARE INSTRUCTIONS  No special instructions are needed after a transfusion. You may find your energy is better. Speak with your caregiver about any limitations on activity for underlying diseases you may have. SEEK MEDICAL CARE IF:   Your condition is not improving after your transfusion.  You develop redness or  irritation at the intravenous (IV) site. SEEK IMMEDIATE MEDICAL CARE IF:  Any of the following symptoms occur over the next 12 hours:  Shaking chills.  You have a temperature by mouth above 102 F (38.9 C), not controlled by medicine.  Chest, back, or muscle pain.  People around you feel you are not acting correctly or are confused.  Shortness of breath or difficulty breathing.  Dizziness and fainting.  You get a rash or develop hives.  You have a decrease in urine output.  Your urine turns a dark color or changes to pink, red, or brown. Any of the following symptoms occur over the next 10 days:  You have a temperature by mouth above 102 F (38.9 C), not controlled by medicine.  Shortness of breath.  Weakness after normal activity.  The white part of the eye turns yellow (jaundice).  You have a decrease in the amount of urine or are urinating less often.  Your urine turns a dark color or changes to pink, red, or brown. Document Released: 03/23/2000 Document Revised: 06/18/2011 Document Reviewed: 11/10/2007 [facility] Patient Information 2014 [place], [place].  _______________________________________________________________________

## 2014-05-14 ENCOUNTER — Telehealth: Payer: Self-pay | Admitting: Gynecologic Oncology

## 2014-05-14 NOTE — Telephone Encounter (Signed)
Message left for patient.  Calling to confirm that she stopped her Coumadin.  Advised to call for any questions or concerns.

## 2014-05-16 ENCOUNTER — Other Ambulatory Visit: Payer: Self-pay | Admitting: Cardiovascular Disease

## 2014-05-17 ENCOUNTER — Telehealth: Payer: Self-pay | Admitting: Cardiovascular Disease

## 2014-05-17 ENCOUNTER — Other Ambulatory Visit: Payer: Self-pay | Admitting: Cardiovascular Disease

## 2014-05-17 ENCOUNTER — Telehealth: Payer: Self-pay | Admitting: Nurse Practitioner

## 2014-05-17 NOTE — Telephone Encounter (Signed)
Pt need a new prescription for Lorazepam #30 and refills.Please call to her new pharmacy [facility]-[phone].

## 2014-05-17 NOTE — Telephone Encounter (Signed)
Rx(s) sent to pharmacy electronically.  

## 2014-05-17 NOTE — Telephone Encounter (Signed)
This RN called patient to remind her of surgery tomorrow 05/18/14 arrival time of 8 AM, surgery to start around 10:00.  Confirmed patient has stopped coumadin, stating tonight will be her 7th missed dose, is on clear liquid diet today, and will be NPO at midnight tonight. Patient denies other needs at this time and thanks for the call.

## 2014-05-17 NOTE — Telephone Encounter (Signed)
Called pt. Advised to call primary since he wrote for it. She acknowledged understanding.

## 2014-05-17 NOTE — Telephone Encounter (Signed)
Returning your call. °

## 2014-05-18 ENCOUNTER — Encounter (HOSPITAL_COMMUNITY)
Admission: RE | Disposition: A | Discharge status: Home / Self Care | Payer: Self-pay | Source: Ambulatory Visit | Attending: Obstetrics & Gynecology

## 2014-05-18 ENCOUNTER — Inpatient Hospital Stay (HOSPITAL_COMMUNITY)
Admission: RE | Admit: 2014-05-18 | Discharge: 2014-05-19 | DRG: 740 | Disposition: A | Discharge status: Home / Self Care | Payer: Medicare Other | Source: Ambulatory Visit | Attending: Obstetrics & Gynecology | Admitting: Obstetrics & Gynecology

## 2014-05-18 ENCOUNTER — Inpatient Hospital Stay (HOSPITAL_COMMUNITY): Payer: Medicare Other | Admitting: Certified Registered Nurse Anesthetist

## 2014-05-18 ENCOUNTER — Encounter (HOSPITAL_COMMUNITY): Payer: Self-pay | Admitting: *Deleted

## 2014-05-18 DIAGNOSIS — Z6841 Body Mass Index (BMI) 40.0 and over, adult: Secondary | ICD-10-CM

## 2014-05-18 DIAGNOSIS — I10 Essential (primary) hypertension: Secondary | ICD-10-CM | POA: Diagnosis present

## 2014-05-18 DIAGNOSIS — N814 Uterovaginal prolapse, unspecified: Secondary | ICD-10-CM | POA: Diagnosis present

## 2014-05-18 DIAGNOSIS — E039 Hypothyroidism, unspecified: Secondary | ICD-10-CM | POA: Diagnosis present

## 2014-05-18 DIAGNOSIS — K219 Gastro-esophageal reflux disease without esophagitis: Secondary | ICD-10-CM | POA: Diagnosis present

## 2014-05-18 DIAGNOSIS — Z7901 Long term (current) use of anticoagulants: Secondary | ICD-10-CM

## 2014-05-18 DIAGNOSIS — Z853 Personal history of malignant neoplasm of breast: Secondary | ICD-10-CM

## 2014-05-18 DIAGNOSIS — Z8542 Personal history of malignant neoplasm of other parts of uterus: Secondary | ICD-10-CM | POA: Diagnosis present

## 2014-05-18 DIAGNOSIS — C541 Malignant neoplasm of endometrium: Principal | ICD-10-CM | POA: Diagnosis present

## 2014-05-18 DIAGNOSIS — Z8249 Family history of ischemic heart disease and other diseases of the circulatory system: Secondary | ICD-10-CM

## 2014-05-18 DIAGNOSIS — E785 Hyperlipidemia, unspecified: Secondary | ICD-10-CM | POA: Diagnosis present

## 2014-05-18 DIAGNOSIS — Z951 Presence of aortocoronary bypass graft: Secondary | ICD-10-CM | POA: Diagnosis not present

## 2014-05-18 HISTORY — PX: ROBOTIC ASSISTED TOTAL HYSTERECTOMY WITH BILATERAL SALPINGO OOPHERECTOMY: SHX6086

## 2014-05-18 LAB — TYPE AND SCREEN
ABO/RH(D): B POS
ANTIBODY SCREEN: NEGATIVE

## 2014-05-18 SURGERY — ROBOTIC ASSISTED TOTAL HYSTERECTOMY WITH BILATERAL SALPINGO OOPHORECTOMY
Anesthesia: General | Laterality: Bilateral

## 2014-05-18 MED ORDER — DEXAMETHASONE SODIUM PHOSPHATE 10 MG/ML IJ SOLN
INTRAMUSCULAR | Status: AC
  Filled 2014-05-18: qty 1

## 2014-05-18 MED ORDER — GLYCOPYRROLATE 0.2 MG/ML IJ SOLN
INTRAMUSCULAR | Status: AC
  Filled 2014-05-18: qty 5

## 2014-05-18 MED ORDER — CEFAZOLIN SODIUM-DEXTROSE 2-3 GM-% IV SOLR
INTRAVENOUS | Status: AC
  Filled 2014-05-18: qty 50

## 2014-05-18 MED ORDER — FENTANYL CITRATE 0.05 MG/ML IJ SOLN
INTRAMUSCULAR | Status: AC
  Filled 2014-05-18: qty 2

## 2014-05-18 MED ORDER — LACTATED RINGERS IV SOLN
INTRAVENOUS | Status: DC
  Administered 2014-05-18: 1000 mL via INTRAVENOUS
  Administered 2014-05-18: 12:00:00 via INTRAVENOUS

## 2014-05-18 MED ORDER — HYDROMORPHONE HCL 1 MG/ML IJ SOLN: 0.2000 mg | INTRAMUSCULAR | Status: DC | PRN

## 2014-05-18 MED ORDER — ENOXAPARIN SODIUM 40 MG/0.4ML ~~LOC~~ SOLN
40.0000 mg | SUBCUTANEOUS | Status: DC
  Administered 2014-05-19: 40 mg via SUBCUTANEOUS
  Filled 2014-05-18: qty 0.4

## 2014-05-18 MED ORDER — AMLODIPINE BESYLATE 5 MG PO TABS
5.0000 mg | ORAL_TABLET | Freq: Every day | ORAL | Status: DC
  Administered 2014-05-18 – 2014-05-19 (×2): 5 mg via ORAL
  Filled 2014-05-18 (×2): qty 1

## 2014-05-18 MED ORDER — ONDANSETRON HCL 4 MG/2ML IJ SOLN
INTRAMUSCULAR | Status: AC
  Filled 2014-05-18: qty 2

## 2014-05-18 MED ORDER — LACTATED RINGERS IV SOLN
INTRAVENOUS | Status: DC | PRN
  Administered 2014-05-18: 1000 mL

## 2014-05-18 MED ORDER — PROPOFOL 10 MG/ML IV BOLUS
INTRAVENOUS | Status: DC | PRN
  Administered 2014-05-18: 175 mg via INTRAVENOUS

## 2014-05-18 MED ORDER — LORAZEPAM 1 MG PO TABS: 1.0000 mg | ORAL_TABLET | Freq: Every evening | ORAL | Status: DC | PRN

## 2014-05-18 MED ORDER — MIDAZOLAM HCL 5 MG/5ML IJ SOLN
INTRAMUSCULAR | Status: DC | PRN
  Administered 2014-05-18 (×2): 1 mg via INTRAVENOUS

## 2014-05-18 MED ORDER — ENOXAPARIN SODIUM 40 MG/0.4ML ~~LOC~~ SOLN
40.0000 mg | SUBCUTANEOUS | Status: DC
  Filled 2014-05-18: qty 0.4

## 2014-05-18 MED ORDER — ASPIRIN 81 MG PO CHEW
81.0000 mg | CHEWABLE_TABLET | Freq: Every morning | ORAL | Status: DC
  Administered 2014-05-19: 81 mg via ORAL
  Filled 2014-05-18: qty 1

## 2014-05-18 MED ORDER — OXYCODONE-ACETAMINOPHEN 5-325 MG PO TABS
1.0000 | ORAL_TABLET | ORAL | Status: DC | PRN
  Administered 2014-05-19: 1 via ORAL
  Administered 2014-05-19: 2 via ORAL
  Filled 2014-05-18: qty 2
  Filled 2014-05-18: qty 1

## 2014-05-18 MED ORDER — STERILE WATER FOR IRRIGATION IR SOLN
Status: DC | PRN
  Administered 2014-05-18: 3000 mL

## 2014-05-18 MED ORDER — CISATRACURIUM BESYLATE 20 MG/10ML IV SOLN
INTRAVENOUS | Status: AC
  Filled 2014-05-18: qty 10

## 2014-05-18 MED ORDER — METOPROLOL TARTRATE 50 MG PO TABS
50.0000 mg | ORAL_TABLET | Freq: Two times a day (BID) | ORAL | Status: DC
  Administered 2014-05-19: 50 mg via ORAL
  Filled 2014-05-18 (×3): qty 1

## 2014-05-18 MED ORDER — PROMETHAZINE HCL 25 MG/ML IJ SOLN: 6.2500 mg | INTRAMUSCULAR | Status: DC | PRN

## 2014-05-18 MED ORDER — IBUPROFEN 800 MG PO TABS
800.0000 mg | ORAL_TABLET | Freq: Three times a day (TID) | ORAL | Status: DC | PRN
Start: 2014-05-18 — End: 2014-05-19
  Administered 2014-05-19: 800 mg via ORAL
  Filled 2014-05-18: qty 1

## 2014-05-18 MED ORDER — LEVOTHYROXINE SODIUM 75 MCG PO TABS
75.0000 ug | ORAL_TABLET | Freq: Every day | ORAL | Status: DC
  Administered 2014-05-19: 75 ug via ORAL
  Filled 2014-05-18 (×2): qty 1

## 2014-05-18 MED ORDER — PROPOFOL 10 MG/ML IV BOLUS
INTRAVENOUS | Status: AC
  Filled 2014-05-18: qty 20

## 2014-05-18 MED ORDER — NEOSTIGMINE METHYLSULFATE 10 MG/10ML IV SOLN
INTRAVENOUS | Status: DC | PRN
  Administered 2014-05-18: 5 mg via INTRAVENOUS

## 2014-05-18 MED ORDER — ONDANSETRON HCL 4 MG PO TABS: 4.0000 mg | ORAL_TABLET | Freq: Four times a day (QID) | ORAL | Status: DC | PRN

## 2014-05-18 MED ORDER — METOCLOPRAMIDE HCL 5 MG/ML IJ SOLN
INTRAMUSCULAR | Status: AC
  Filled 2014-05-18: qty 2

## 2014-05-18 MED ORDER — HYDROMORPHONE HCL 1 MG/ML IJ SOLN
0.5000 mg | INTRAMUSCULAR | Status: DC | PRN
  Administered 2014-05-18 (×2): 0.5 mg via INTRAVENOUS

## 2014-05-18 MED ORDER — LIDOCAINE HCL (CARDIAC) 20 MG/ML IV SOLN
INTRAVENOUS | Status: DC | PRN
  Administered 2014-05-18: 75 mg via INTRAVENOUS

## 2014-05-18 MED ORDER — CEFAZOLIN SODIUM-DEXTROSE 2-3 GM-% IV SOLR
2.0000 g | INTRAVENOUS | Status: AC
  Administered 2014-05-18: 2 g via INTRAVENOUS

## 2014-05-18 MED ORDER — SUCCINYLCHOLINE CHLORIDE 20 MG/ML IJ SOLN
INTRAMUSCULAR | Status: DC | PRN
  Administered 2014-05-18: 130 mg via INTRAVENOUS

## 2014-05-18 MED ORDER — MIDAZOLAM HCL 2 MG/2ML IJ SOLN
INTRAMUSCULAR | Status: AC
  Filled 2014-05-18: qty 2

## 2014-05-18 MED ORDER — MEPERIDINE HCL 50 MG/ML IJ SOLN: 6.2500 mg | INTRAMUSCULAR | Status: DC | PRN

## 2014-05-18 MED ORDER — FENTANYL CITRATE 0.05 MG/ML IJ SOLN
25.0000 ug | INTRAMUSCULAR | Status: DC | PRN
  Administered 2014-05-18: 50 ug via INTRAVENOUS
  Administered 2014-05-18 (×2): 25 ug via INTRAVENOUS

## 2014-05-18 MED ORDER — NEOSTIGMINE METHYLSULFATE 10 MG/10ML IV SOLN
INTRAVENOUS | Status: AC
  Filled 2014-05-18: qty 1

## 2014-05-18 MED ORDER — FENTANYL CITRATE 0.05 MG/ML IJ SOLN
INTRAMUSCULAR | Status: AC
  Filled 2014-05-18: qty 5

## 2014-05-18 MED ORDER — ONDANSETRON HCL 4 MG/2ML IJ SOLN
INTRAMUSCULAR | Status: DC | PRN
  Administered 2014-05-18: 4 mg via INTRAVENOUS

## 2014-05-18 MED ORDER — METOCLOPRAMIDE HCL 5 MG/ML IJ SOLN
INTRAMUSCULAR | Status: DC | PRN
  Administered 2014-05-18: 5 mg via INTRAVENOUS

## 2014-05-18 MED ORDER — CISATRACURIUM BESYLATE (PF) 10 MG/5ML IV SOLN
INTRAVENOUS | Status: DC | PRN
  Administered 2014-05-18: 2 mg via INTRAVENOUS
  Administered 2014-05-18: 8 mg via INTRAVENOUS
  Administered 2014-05-18: 4 mg via INTRAVENOUS

## 2014-05-18 MED ORDER — HYDROMORPHONE HCL 1 MG/ML IJ SOLN
INTRAMUSCULAR | Status: AC
  Filled 2014-05-18: qty 1

## 2014-05-18 MED ORDER — LIDOCAINE HCL (CARDIAC) 20 MG/ML IV SOLN
INTRAVENOUS | Status: AC
  Filled 2014-05-18: qty 5

## 2014-05-18 MED ORDER — KCL IN DEXTROSE-NACL 20-5-0.45 MEQ/L-%-% IV SOLN
INTRAVENOUS | Status: DC
  Administered 2014-05-18: 16:00:00 via INTRAVENOUS
  Filled 2014-05-18 (×2): qty 1000

## 2014-05-18 MED ORDER — GLYCOPYRROLATE 0.2 MG/ML IJ SOLN
INTRAMUSCULAR | Status: DC | PRN
  Administered 2014-05-18 (×2): .2 mg via INTRAVENOUS
  Administered 2014-05-18: .4 mg via INTRAVENOUS

## 2014-05-18 MED ORDER — BENAZEPRIL HCL 40 MG PO TABS
40.0000 mg | ORAL_TABLET | Freq: Every day | ORAL | Status: DC
  Administered 2014-05-18: 40 mg via ORAL
  Filled 2014-05-18 (×3): qty 1

## 2014-05-18 MED ORDER — DEXAMETHASONE SODIUM PHOSPHATE 10 MG/ML IJ SOLN
INTRAMUSCULAR | Status: DC | PRN
  Administered 2014-05-18: 10 mg via INTRAVENOUS

## 2014-05-18 MED ORDER — FENTANYL CITRATE 0.05 MG/ML IJ SOLN
INTRAMUSCULAR | Status: DC | PRN
Start: 2014-05-18 — End: 2014-05-18
  Administered 2014-05-18 (×5): 50 ug via INTRAVENOUS

## 2014-05-18 MED ORDER — EPHEDRINE SULFATE 50 MG/ML IJ SOLN
INTRAMUSCULAR | Status: DC | PRN
  Administered 2014-05-18 (×2): 5 mg via INTRAVENOUS

## 2014-05-18 MED ORDER — ONDANSETRON HCL 4 MG/2ML IJ SOLN: 4.0000 mg | Freq: Four times a day (QID) | INTRAMUSCULAR | Status: DC | PRN

## 2014-05-18 SURGICAL SUPPLY — 52 items
ADH SKN CLS APL DERMABOND .7 (GAUZE/BANDAGES/DRESSINGS) ×1
BAG SPEC RTRVL LRG 6X4 10 (ENDOMECHANICALS) ×2
CABLE HIGH FREQUENCY MONO STRZ (ELECTRODE) ×2 IMPLANT
CHLORAPREP W/TINT 26ML (MISCELLANEOUS) ×2 IMPLANT
CORDS BIPOLAR (ELECTRODE) ×2 IMPLANT
COVER SURGICAL LIGHT HANDLE (MISCELLANEOUS) ×2 IMPLANT
COVER TIP SHEARS 8 DVNC (MISCELLANEOUS) ×1 IMPLANT
COVER TIP SHEARS 8MM DA VINCI (MISCELLANEOUS) ×1
DERMABOND ADVANCED (GAUZE/BANDAGES/DRESSINGS) ×1
DERMABOND ADVANCED .7 DNX12 (GAUZE/BANDAGES/DRESSINGS) IMPLANT
DRAPE SHEET LG 3/4 BI-LAMINATE (DRAPES) ×4 IMPLANT
DRAPE SURG IRRIG POUCH 19X23 (DRAPES) ×2 IMPLANT
DRAPE TABLE BACK 44X90 PK DISP (DRAPES) ×4 IMPLANT
DRAPE WARM FLUID 44X44 (DRAPE) ×2 IMPLANT
DRSG TEGADERM 6X8 (GAUZE/BANDAGES/DRESSINGS) ×4 IMPLANT
ELECT REM PT RETURN 9FT ADLT (ELECTROSURGICAL) ×2
ELECTRODE REM PT RTRN 9FT ADLT (ELECTROSURGICAL) ×1 IMPLANT
GLOVE BIO SURGEON STRL SZ 6 (GLOVE) ×6 IMPLANT
GLOVE BIO SURGEON STRL SZ 6.5 (GLOVE) ×4 IMPLANT
GOWN STRL REUS W/ TWL LRG LVL4 (GOWN DISPOSABLE) ×3 IMPLANT
GOWN STRL REUS W/TWL LRG LVL3 (GOWN DISPOSABLE) ×6 IMPLANT
GOWN STRL REUS W/TWL LRG LVL4 (GOWN DISPOSABLE) ×6
HOLDER FOLEY CATH W/STRAP (MISCELLANEOUS) ×2 IMPLANT
KIT ACCESSORY DA VINCI DISP (KITS) ×1
KIT ACCESSORY DVNC DISP (KITS) ×1 IMPLANT
KIT BASIN OR (CUSTOM PROCEDURE TRAY) ×2 IMPLANT
KIT PROCEDURE DA VINCI SI (MISCELLANEOUS) ×1
KIT PROCEDURE DVNC SI (MISCELLANEOUS) IMPLANT
LIQUID BAND (GAUZE/BANDAGES/DRESSINGS) ×2 IMPLANT
MANIPULATOR UTERINE 4.5 ZUMI (MISCELLANEOUS) ×2 IMPLANT
OCCLUDER COLPOPNEUMO (BALLOONS) ×2 IMPLANT
PEN SKIN MARKING BROAD (MISCELLANEOUS) ×2 IMPLANT
POUCH SPECIMEN RETRIEVAL 10MM (ENDOMECHANICALS) ×2 IMPLANT
SET TUBE IRRIG SUCTION NO TIP (IRRIGATION / IRRIGATOR) ×2 IMPLANT
SHEET LAVH (DRAPES) ×2 IMPLANT
SOLUTION ANTI FOG 6CC (MISCELLANEOUS) ×2 IMPLANT
SOLUTION ELECTROLUBE (MISCELLANEOUS) ×2 IMPLANT
SUT VIC AB 0 CT1 27 (SUTURE) ×2
SUT VIC AB 0 CT1 27XBRD ANTBC (SUTURE) ×1 IMPLANT
SUT VIC AB 4-0 PS2 27 (SUTURE) ×4 IMPLANT
SUT VICRYL 0 UR6 27IN ABS (SUTURE) ×2 IMPLANT
SYR 50ML LL SCALE MARK (SYRINGE) ×2 IMPLANT
TOWEL OR 17X26 10 PK STRL BLUE (TOWEL DISPOSABLE) ×4 IMPLANT
TOWEL OR NON WOVEN STRL DISP B (DISPOSABLE) ×2 IMPLANT
TRAP SPECIMEN MUCOUS 40CC (MISCELLANEOUS) IMPLANT
TRAY FOLEY CATH 14FRSI W/METER (CATHETERS) ×2 IMPLANT
TRAY LAPAROSCOPIC (CUSTOM PROCEDURE TRAY) ×2 IMPLANT
TROCAR 12M 150ML BLUNT (TROCAR) ×2 IMPLANT
TROCAR BLADELESS OPT 5 100 (ENDOMECHANICALS) ×2 IMPLANT
TROCAR XCEL 12X100 BLDLESS (ENDOMECHANICALS) ×2 IMPLANT
TUBING INSUFFLATION 10FT LAP (TUBING) ×2 IMPLANT
WATER STERILE IRR 1500ML POUR (IV SOLUTION) ×4 IMPLANT

## 2014-05-18 NOTE — Anesthesia Postprocedure Evaluation (Signed)
  Anesthesia Post-op Note  Patient: Publishing copy  Procedure(s) Performed: Procedure(s) (LRB): ROBOTIC ASSISTED TOTAL HYSTERECTOMY WITH BILATERAL SALPINGO OOPHORECTOMY SENTINAL LYMPH NODE MAPPING  (Bilateral)  Patient Location: PACU  Anesthesia Type: General  Level of Consciousness: awake and alert   Airway and Oxygen Therapy: Patient Spontanous Breathing  Post-op Pain: mild  Post-op Assessment: Post-op Vital signs reviewed, Patient's Cardiovascular Status Stable, Respiratory Function Stable, Patent Airway and No signs of Nausea or vomiting  Last Vitals:  Filed Vitals:   05/18/14 1512  BP: 118/44  Pulse: 56  Temp: 36.4 C  Resp: 14    Post-op Vital Signs: stable   Complications: No apparent anesthesia complications

## 2014-05-18 NOTE — Progress Notes (Signed)
Utilization review completed.  

## 2014-05-18 NOTE — Interval H&P Note (Signed)
History and Physical Interval Note:  05/18/2014 10:30 AM  Breanna Hernandez  has presented today for surgery, with the diagnosis of ENDOMETRIAL CANCER   The various methods of treatment have been discussed with the patient and family. After consideration of risks, benefits and other options for treatment, the patient has consented to  Procedure(s): ROBOTIC ASSISTED TOTAL HYSTERECTOMY WITH BILATERAL SALPINGO OOPHORECTOMY [place]  (Bilateral) as a surgical intervention .  The patient's history has been reviewed, patient examined, no change in status, stable for surgery.  I have reviewed the patient's chart and labs.  Questions were answered to the patient's satisfaction.     Donaciano Eva

## 2014-05-18 NOTE — H&P (View-Only) (Signed)
Consult Note: Gyn-Onc  Consult was requested by Dr. Landry Mellow for the evaluation of Breanna Hernandez 69 y.o. female with grade 2 endometrial cancer  CC:  Chief Complaint  Patient presents with  . endometrial cancer    Assessment/Plan:  Breanna Hernandez  is a 69 y.o.  year old woman with grade 2 endometrial cancer.   A detailed discussion was held with the patient and her family with regard to to her endometrial cancer diagnosis. We discussed the standard management options for uterine cancer which includes surgery followed possibly by adjuvant therapy depending on the results of surgery. The options for surgical management include a hysterectomy and removal of the tubes and ovaries possibly with removal of pelvic and para-aortic lymph nodes. A minimally invasive approach including a robotic hysterectomy or laparoscopic hysterectomy have benefits including shorter hospital stay, recovery time and better wound healing. The alternative approach is an open hysterectomy. The patient has been counseled about these surgical options and the risks of surgery in general including infection, bleeding, damage to surrounding structures (including bowel, bladder, ureters, nerves or vessels), and the postoperative risks of PE/ DVT, and lymphedema. I extensively reviewed the additional risks of robotic hysterectomy including possible need for conversion to open laparotomy.  I discussed positioning during surgery of trendelenberg and risks of minor facial swelling and care we take in preoperative positioning. I discussed alternatives to complete lymphadenectomy including sentinel lymph node mapping and discussed that SLN mapping is associated with a 0.4% false negative rate and a potential for the reduction in postoperative morbidity.  After counseling and consideration of her options, she desires to proceed with robotic hysterectomy, BSO, and sentinel lymph node mapping.    She takes Coumadin daily for a history of an  aortic dissection which was repaired in January 2012. She reports that she was taken off this Coumadin for her D&C procedure. I discussed the increased risk for bleeding associated with perioperative anticoagulation use, and recommend discontinuing Coumadin 7 days preoperatively with an INR/PT check on the day before surgery. I will discuss her case with her cardiologist to ensure that this is safe and that she does not require bridging with Lovenox preoperatively. We will restart the Coumadin on postoperative day 1 if her bleeding risk is deemed to low by intraoperative findings.  She will be seen by anesthesia for preoperative clearance and discussion of postoperative pain management.  She was given the opportunity to ask questions, which were answered to her satisfaction, and she is agreement with the above mentioned plan of care.  HPI: Breanna Hernandez is a 69 year old gravida 6 para 6 who is seen in consultation at the request of Dr. Landry Mellow for grade 2 endometrioid endometrial adenocarcinoma she reports a history of postmenopausal bleeding since 02/07/2014. It is light and only spotting. This prompted evaluation by her Dr., Dr. Landry Mellow, who performed a transvaginal ultrasound which revealed a uterus measuring 4 x 8 x 4 cm, with a 1.9 cm endometrial stripe. Subsequently she underwent a D&C on 04/13/2014 which revealed endometrioid carcinoma endometrioid cell type with squamous differentiation grade 2.  The patient has a personal history of breast cancer, bilateral, in 2005 and 2007. She's been treated with surgery and radiation for this and has not received chemotherapy or tamoxifen. She also has a history of peripheral neuropathy of unknown etiology. It affects her lower extremities and makes it difficult for her to walk. She has difficulty with balance.  Interval History: she continues to have light vaginal spotting.  Current Meds:  Outpatient Encounter Prescriptions as of 04/26/2014  Medication Sig  .  amLODipine (NORVASC) 5 MG tablet TAKE ONE TABLET BY MOUTH ONCE DAILY  . aspirin 81 MG tablet Take 81 mg by mouth daily.    . benazepril (LOTENSIN) 40 MG tablet Take 1 tablet (40 mg total) by mouth daily.  Marland Kitchen CALCIUM-MAGNESIUM-VITAMIN D PO Take 1 tablet by mouth daily.   . fish oil-omega-3 fatty acids 1000 MG capsule Take 1 g by mouth 2 (two) times daily.   . hydrochlorothiazide (MICROZIDE) 12.5 MG capsule TAKE ONE CAPSULE BY MOUTH ONCE DAILY  . HYDROcodone-acetaminophen (NORCO) 5-325 MG per tablet Take 1-2 tablets by mouth every 6 (six) hours as needed for moderate pain.  . hydrocortisone 2.5 % cream Apply 1 application topically as needed.   Marland Kitchen ketoconazole (NIZORAL) 2 % cream Apply 1 application topically daily as needed.   Marland Kitchen levothyroxine (SYNTHROID, LEVOTHROID) 75 MCG tablet Take 1 tablet (75 mcg total) by mouth daily before breakfast.  . LORazepam (ATIVAN) 1 MG tablet 1 by mouth " as needed" and should not be taken on a regular basis. Regular use can increase risk of addiction but more importantly affect level of alertness and balance with increased risk of falling. APPOINTMENT DUE (Patient taking differently: Take 1 mg by mouth at bedtime as needed for sleep. 1 by mouth " as needed" and should not be taken on a regular basis. Regular use can increase risk of addiction but more importantly affect level of alertness and balance with increased risk of falling. APPOINTMENT DUE)  . metoprolol (LOPRESSOR) 50 MG tablet Take 1 tablet (50 mg total) by mouth 2 (two) times daily.  Marland Kitchen warfarin (COUMADIN) 4 MG tablet Take 4-6 mg by mouth daily. 4 mg every day except Tuesdays, Thursdays and Saturdays and takes 6 mg on those days  . [DISCONTINUED] warfarin (COUMADIN) 4 MG tablet TAKE 1 AND 1/2 TABLETS BY MOUTH ONCE DAILY OR AS DIRECTED (Patient not taking: Reported on 04/13/2014)    Allergy: No Known Allergies  Social Hx:   History   Social History  . Marital Status: Married    Spouse Name: N/A    Number  of Children: N/A  . Years of Education: N/A   Occupational History  . Not on file.   Social History Main Topics  . Smoking status: Never Smoker   . Smokeless tobacco: Never Used  . Alcohol Use: No  . Drug Use: No  . Sexual Activity: Yes   Other Topics Concern  . Not on file   Social History Narrative    Past Surgical Hx:  Past Surgical History  Procedure Laterality Date  . Cholecystectomy    . Tonsillectomy    . Colonoscopy w/ polypectomy    . Breast lumpectomy  2000    Right x2, Left x1  . Median sternotomy, extracorporeal circulation, repair of type 1 aortic dissection with  tube graft from  sinotubular junction to take off of innominate artery, distal anastomoses under deep bypothermic circulatory arrest  05/03/2010    Dr Roxan Hockey  . Cabg x 1  05/03/2010    RCA -Dr Roxan Hockey (with type 1 aortic dissection)  . Hysteroscopy w/d&c N/A 04/13/2014    Procedure: DILATATION AND CURETTAGE /HYSTEROSCOPY Polypectomy;  Surgeon: Maeola Sarah. Landry Mellow, MD;  Location: [facility];  Service: Gynecology;  Laterality: N/A;  Possible Polypectomy    Past Medical Hx:  Past Medical History  Diagnosis Date  . HTN (hypertension)   . Hypothyroidism   . Breast  cancer     x 3  . 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 Diverticulosis   . Hyperplastic colon polyp   . Hyperlipidemia   . Obesity   . IBS (irritable bowel syndrome)   . Atrial fibrillation     h/o post-op 04/2010  . Renal cyst, left   . Fatty liver 01/10/07  . Lower extremity neuropathy     Past Gynecological History:  SVD x 6  No LMP recorded. Patient is postmenopausal.  Family Hx:  Family History  Problem Relation Age of Onset  . Aneurysm Father     CNS  . Heart attack Father     x2  . Alzheimer's disease Mother   . Hypertension Mother   . Hypertension Brother   . Goiter Mother   . Colon cancer Neg Hx     Review of Systems:  Constitutional  Feels well,    ENT Normal appearing ears and nares  bilaterally Skin/Breast  No rash, sores, jaundice, itching, dryness Cardiovascular  No chest pain, shortness of breath, or edema  Pulmonary  No cough or wheeze.  Gastro Intestinal  No nausea, vomitting, or diarrhoea. No bright red blood per rectum, no abdominal pain, change in bowel movement, or constipation.  Genito Urinary  No frequency, urgency, dysuria, see HPI Musculo Skeletal  No myalgia, arthralgia, joint swelling or pain  Neurologic  No weakness, numbness, change in gait,  Psychology  No depression, anxiety, insomnia.   Vitals:  Blood pressure 179/67, pulse 60, temperature 97.9 F (36.6 C), temperature source Oral, resp. rate 18, height 5' 4.5" (1.638 m), weight 245 lb 11.2 oz (111.449 kg).  Physical Exam: WD in NAD Neck  Supple NROM, without any enlargements.  Lymph Node Survey No cervical supraclavicular or inguinal adenopathy Cardiovascular  Pulse normal rate, regularity and rhythm. S1 and S2 normal.  Lungs  Clear to auscultation bilateraly, without wheezes/crackles/rhonchi. Good air movement.  Skin  No rash/lesions/breakdown  Psychiatry  Alert and oriented to person, place, and time  Abdomen  Normoactive bowel sounds, abdomen soft, non-tender and obese without evidence of hernia. Vertical midline incision from cholecystectomy Back No CVA tenderness Genito Urinary  Vulva/vagina: Normal external female genitalia.  No lesions. No discharge or bleeding.  Bladder/urethra:  No lesions or masses, well supported bladder  Vagina: normal in appearance  Cervix: Normal appearing, no lesions.  Uterus:  Small, mobile, no parametrial involvement or nodularity.  Adnexa: no palpable masses. Rectal  Good tone, no masses no cul de sac nodularity. Hemorrhoid present. Extremities  No bilateral cyanosis, clubbing or edema.   Donaciano Eva, MD   04/26/2014, 3:43 PM

## 2014-05-18 NOTE — Anesthesia Procedure Notes (Signed)
Procedure Name: Intubation Date/Time: 05/18/2014 11:23 AM Performed by: Ofilia Neas Pre-anesthesia Checklist: Patient identified, Emergency Drugs available, Patient being monitored, Suction available and Timeout performed Patient Re-evaluated:Patient Re-evaluated prior to inductionOxygen Delivery Method: Circle system utilized Preoxygenation: Pre-oxygenation with 100% oxygen Intubation Type: IV induction and Cricoid Pressure applied Ventilation: Mask ventilation without difficulty Laryngoscope Size: Mac and 4 Grade View: Grade II Tube type: Oral Tube size: 7.5 mm Number of attempts: 1 Airway Equipment and Method: Stylet Placement Confirmation: ETT inserted through vocal cords under direct vision,  positive ETCO2 and breath sounds checked- equal and bilateral Secured at: 20 cm Tube secured with: Tape Dental Injury: Teeth and Oropharynx as per pre-operative assessment

## 2014-05-18 NOTE — Op Note (Signed)
OPERATIVE NOTE  Preoperative Diagnosis: Grade 2 endometrial cancer  Postoperative Diagnosis: same  Procedure(s) Performed: Robotic total laparoscopic hysterectomy, Bilateral salpingo oophorectomy,  Bilateral pelvic lymph node dissection. Sentinel lymph node mapping.  Anesthesia: General  Surgeon: Donaciano Eva, MD  Assistant Surgeon: Lahoma Crocker MD. (an MD assistant was necessary for tissue manipulation, management of robotic instrumentation, retraction and positioning due to the complexity of the case and hospital policies).    Specimens: Uterus cervix, bilateral tubes and ovaries. Left and right pelvic nodes. Washings.  Estimated Blood Loss: 49mL.   Complications:none  Indication for Procedure: This is a 37 age-old who underwent prior endometrial biopsy  Demonstrating  grade 2 endometrial cancer.  Operative Findings:  6 cm uterus. Normal appearing adnexa. No masses. Adhesions between omentum and anterior abdominal wall. Significant anterior wall uterine prolapse. No suspicious nodes.  Procedure: Patient was taken to the operating room and placed under general endotracheal anesthesia without any difficulty. She is placed in the dorsal lithotomy position and secured to the operative table over the chest with tape.   The patient was prepped and draped and the uterine manipulator placed within the endometrial cavity. 1mg  of ICG was injected into 3 and 9 o'clock of the cervix. The appropriately sized Koh ring was circumferentially around the cervix. The balloon was placed within the vagina. An OG tube was present and functional. At an area on the left in line with the nipple approximately 2 cm below the ribs and a 5 mm Optiview inserted under direct visualization. The abdomen was insufflated to 15 mm of mercury and the pressure never deviated above that throughout the remainder of the procedure. Maximum Trendelenburg positioning was obtained. At approximately 22 cm proximal to the  symphysis pubis an incision was made just superior to the umbilicus in addition to incisions in the mid left and right abdomen and left lower quadrant above the ASIS. 8 Millimeter robotic ports were placed in the other 3 incisions under direct visualization. The left upper quadrant port site was replaced with a 10 mm port. This was all completed under direct visualization. The small and large bowel were reflected as much as possible into the upper abdomen. There was substantial visceral adiposity limiting the view of the peritoneal cavity.The robot was docked and instruments placed.  The retroperitoneal spaces were opened bilaterally and the para-rectal and para-vesical spaces developed .This was performed under NIR imaging to perform the SLN mapping.  There were no lymphatic channels noted in the retroperitoneal spaces. The right round ligament was transected and the ureter was identified. The right infundibulopelvic ligament was cauterized and transected The retroperitoneal space was entered on the right and the peritoneum incised to the level of the vesicouterine ligament anteriorly. The bladder flap was created using Bovie cautery. The peritoneal dissection was continued inferiorly and across the inferior most aspect of the cervix. In this manner the urethra was deflected inferiorly. The bladder flap was further developed. The uterine vessels on the right were skeletonized ligated and transected.  The left ureter was identified. The left gonadal vessels were cauterized and transected. The broad ligament was skeletonized posteriorly to the level of the cervix and the peritoneum dissected free from the cervix and in this fashion the ureter was deflected inferiorly. The anterior peritoneum was further dissected and the bladder flap appropriately developed. The uterine vessels were skeletonized cauterized and transected. The balloon and the vagina was then maximally insufflated. A olpotomy incision was made  circumferentially and the uterus cervix ovaries  and tubes were ivered from the vagina. The Koh ring was removed and the balloon was replaced.  Right pelvic lymph node dissection was then initiated. The superior vesicle artery was identified and the vesicouterine space developed. The obturator nerve was identified. Nodal tissue was removed within the boundaries of the right genitofemoral nerve the right circumflex vein, the ureter and the superior vesicle artery.  The nodal tissue was placed in an Endo Catch bag. The left pelvic lymph node dissection was then initiated. The superior vesicle artery was identified and the vesicouterine space developed. The obturator nerve was identified. Nodal tissue was removed within the boundaries of the left  genitofemoral nerve the left circumflex vein, the ureter and the superior vesicle artery. The nodal tissue was placed in an Endo Catch bag.   The pelvis was copiously irrigated and drained and hemostasis was assured. The vaginal cuff was closed with a running 0 Vicryl suture ligature. The needle was removed under direct visualization. The operative site is once again visualized and hemostasis was assured. The instruments were removed from the abdomen and pelvis and the port sites irrigated. The umbilical fascia was closed with an interrupted 0 Vicryl  suture. The subcutaneous tissue of the umbilical left upper quadrant and right lower quadrant subcutaneous tissues were approximated with a single suture. Skin incisions were closed with a subcuticular suture.  The vaginal vault was cleared with a moist sponge stick.  Sponge, lap and needle counts were correct x 3.    The patient had sequential compression devices and preoperative Lovenox for VTE prophylaxis and will receive Lovenox postoperatively.          Disposition: PACU - hemodynamically stable.         Condition:stable Foley draining clear urine.  Donaciano Eva, MD

## 2014-05-18 NOTE — Transfer of Care (Signed)
Immediate Anesthesia Transfer of Care Note  Patient: [name]  Procedure(s) Performed: Procedure(s): ROBOTIC ASSISTED TOTAL HYSTERECTOMY WITH BILATERAL SALPINGO OOPHORECTOMY SENTINAL LYMPH NODE MAPPING  (Bilateral)  Patient Location: PACU  Anesthesia Type:General  Level of Consciousness: awake, oriented, patient cooperative, lethargic and responds to stimulation  Airway & Oxygen Therapy: Patient Spontanous Breathing and Patient connected to face mask oxygen  Post-op Assessment: Report given to RN, Post -op Vital signs reviewed and stable and Patient moving all extremities  Post vital signs: Reviewed and stable  Last Vitals:  Filed Vitals:   05/18/14 0755  BP: 140/60  Pulse: 58  Temp: 36.4 C  Resp: 18    Complications: No apparent anesthesia complications

## 2014-05-18 NOTE — Anesthesia Preprocedure Evaluation (Signed)
Anesthesia Evaluation  Patient identified by MRN, date of birth, ID band Patient awake    Reviewed: Allergy & Precautions, H&P , NPO status , Patient's Chart, lab work & pertinent test results, reviewed documented beta blocker date and time   Airway Mallampati: II  TM Distance: >3 FB Neck ROM: full    Dental no notable dental hx. (+)    Pulmonary  breath sounds clear to auscultation  Pulmonary exam normal       Cardiovascular hypertension, On Medications and Pt. on medications + dysrhythmias Atrial Fibrillation Rhythm:regular Rate:Normal  Hx/o Aortic Dissection repaired 2012   Neuro/Psych    GI/Hepatic GERD-  ,  Endo/Other  Hypothyroidism Morbid obesity  Renal/GU Renal InsufficiencyRenal disease     Musculoskeletal negative musculoskeletal ROS (+)   Abdominal   Peds  Hematology   Anesthesia Other Findings Off coumadin since 12/28 No difficulty with previous GA  Reproductive/Obstetrics negative OB ROS                             Anesthesia Physical  Anesthesia Plan  ASA: III  Anesthesia Plan:    Post-op Pain Management:    Induction: Intravenous  Airway Management Planned: Oral ETT  Additional Equipment:   Intra-op Plan:   Post-operative Plan: Extubation in OR  Informed Consent: I have reviewed the patients History and Physical, chart, labs and discussed the procedure including the risks, benefits and alternatives for the proposed anesthesia with the patient or authorized representative who has indicated his/her understanding and acceptance.   Dental Advisory Given and Dental advisory given  Plan Discussed with: CRNA, Surgeon and Anesthesiologist  Anesthesia Plan Comments: (Discussed GA with LMA, possible sore throat, potential need to switch to ETT, N/V, pulmonary aspiration. Questions answered. )        Anesthesia Quick Evaluation

## 2014-05-19 ENCOUNTER — Encounter (HOSPITAL_COMMUNITY): Payer: Self-pay | Admitting: Gynecologic Oncology

## 2014-05-19 LAB — CBC
HCT: 34.7 % — ABNORMAL LOW (ref 36.0–46.0)
HEMOGLOBIN: 11.8 g/dL — AB (ref 12.0–15.0)
MCH: 33.4 pg (ref 26.0–34.0)
MCHC: 34 g/dL (ref 30.0–36.0)
MCV: 98.3 fL (ref 78.0–100.0)
PLATELETS: 198 10*3/uL (ref 150–400)
RBC: 3.53 MIL/uL — AB (ref 3.87–5.11)
RDW: 12.4 % (ref 11.5–15.5)
WBC: 12.9 10*3/uL — AB (ref 4.0–10.5)

## 2014-05-19 LAB — BASIC METABOLIC PANEL
Anion gap: 10 (ref 5–15)
BUN: 15 mg/dL (ref 6–23)
CALCIUM: 8.7 mg/dL (ref 8.4–10.5)
CO2: 26 mmol/L (ref 19–32)
CREATININE: 0.73 mg/dL (ref 0.50–1.10)
Chloride: 99 mmol/L (ref 96–112)
GFR calc Af Amer: >90 mL/min (ref >90)
GFR, EST NON AFRICAN AMERICAN: 85 mL/min — AB (ref >90)
GLUCOSE: 154 mg/dL — AB (ref 70–99)
Potassium: 3.6 mmol/L (ref 3.5–5.1)
SODIUM: 135 mmol/L (ref 135–145)

## 2014-05-19 MED ORDER — OXYCODONE-ACETAMINOPHEN 5-325 MG PO TABS: 1.0000 | ORAL_TABLET | ORAL | Status: DC | PRN

## 2014-05-19 NOTE — Progress Notes (Signed)
Discharge instructions given to patient along with prescriptions. No questions at this time.

## 2014-05-19 NOTE — Discharge Summary (Signed)
Physician Discharge Summary  Patient ID: Breanna Hernandez MRN: [id number] DOB/AGE: 71-11-1945 71 y.o.  Admit date: 05/18/2014 Discharge date: 05/19/2014  Admission Diagnoses: Endometrial cancer  Discharge Diagnoses:  Principal Problem:   Endometrial cancer   Discharged Condition:  The patient is in good condition and stable for discharge.    Hospital Course: On 05/18/2014, the patient underwent the following: Procedure(s):  ROBOTIC ASSISTED TOTAL HYSTERECTOMY WITH BILATERAL SALPINGO OOPHORECTOMY SENTINAL LYMPH NODE [place] .  The postoperative course was uneventful.  She was discharged to home on postoperative day 1 tolerating a regular diet, voiding without difficulty, with minimal pain.    Consults: None  Significant Diagnostic Studies: None  Treatments: surgery: see above  Discharge Exam: Blood pressure 121/54, pulse 63, temperature 98.3 F (36.8 C), temperature source Oral, resp. rate 18, height 5\' 4"  (1.626 m), weight 247 lb (112.038 kg), SpO2 99 %. General appearance: alert, cooperative and no distress Resp: clear to auscultation bilaterally Cardio: regular rate and rhythm, S1, S2 normal, no murmur, click, rub or gallop GI: abdomen morbidly obese, soft, active bowel sounds, nontender, non-tympanic Extremities: mild 1+ pitting edema with LLE- not a new finding per pt Skin: rash to forehead and eyes stopping at the cheek bone, petechiae appearance but non-blanching Incision/Wound: Abdomen lap sites with dermabond, no erythema or drainage present  Denies itching or burning of the face.  Pt to address with anesthesia team during post-op day 1 rounds.  Disposition: 01-Home or Self Care      Discharge Instructions    Call MD for:  difficulty breathing, headache or visual disturbances    Complete by:  As directed      Call MD for:  extreme fatigue    Complete by:  As directed      Call MD for:  hives    Complete by:  As directed      Call MD for:  persistant dizziness or  light-headedness    Complete by:  As directed      Call MD for:  persistant nausea and vomiting    Complete by:  As directed      Call MD for:  redness, tenderness, or signs of infection (pain, swelling, redness, odor or green/yellow discharge around incision site)    Complete by:  As directed      Call MD for:  severe uncontrolled pain    Complete by:  As directed      Call MD for:  temperature >100.4    Complete by:  As directed      Diet - low sodium heart healthy    Complete by:  As directed      Driving Restrictions    Complete by:  As directed   No driving for 1 week.  Do not take narcotics and drive.     Increase activity slowly    Complete by:  As directed      Lifting restrictions    Complete by:  As directed   No lifting greater than 10 lbs.     Sexual Activity Restrictions    Complete by:  As directed   No sexual activity, nothing in the vagina, for 8 weeks.            Medication List    STOP taking these medications        HYDROcodone-acetaminophen 5-325 MG per tablet  Commonly known as:  Nuevo these medications        acetaminophen 500  MG tablet  Commonly known as:  TYLENOL  Take 1,000 mg by mouth every 6 (six) hours as needed for moderate pain or headache.     amLODipine 5 MG tablet  Commonly known as:  NORVASC  TAKE ONE TABLET BY MOUTH ONCE DAILY     aspirin 81 MG tablet  Take 81 mg by mouth every morning.     benazepril 40 MG tablet  Commonly known as:  LOTENSIN  TAKE ONE TABLET BY MOUTH EVERY DAY     CALCIUM-MAGNESIUM-VITAMIN D PO  Take 1 tablet by mouth 2 (two) times daily.     fish oil-omega-3 fatty acids 1000 MG capsule  Take 1 g by mouth 2 (two) times daily.     hydrochlorothiazide 12.5 MG capsule  Commonly known as:  MICROZIDE  TAKE ONE CAPSULE BY MOUTH ONCE DAILY     hydrocortisone 2.5 % cream  Apply 1 application topically daily as needed (groin irritation).     ibuprofen 200 MG tablet  Commonly known as:   ADVIL,MOTRIN  Take 400 mg by mouth every 6 (six) hours as needed for headache or moderate pain.     ketoconazole 2 % cream  Commonly known as:  NIZORAL  Apply 1 application topically daily as needed for irritation (groin).     levothyroxine 75 MCG tablet  Commonly known as:  SYNTHROID, LEVOTHROID  Take 1 tablet (75 mcg total) by mouth daily before breakfast.     LORazepam 1 MG tablet  Commonly known as:  ATIVAN  1 by mouth " as needed" and should not be taken on a regular basis. Regular use can increase risk of addiction but more importantly affect level of alertness and balance with increased risk of falling. APPOINTMENT DUE     metoprolol 50 MG tablet  Commonly known as:  LOPRESSOR  TAKE ONE TABLET BY MOUTH TWICE DAILY     OVER THE COUNTER MEDICATION  - Take 1 tablet by mouth every morning. Product Name: EHT Mind enhancement formula  - Vitamin d 200 units  - Vitamin b 1.6 mg  - Vitamin b-12 10 mcg  - Selinum 70 mcg  - Coffee Extract 35 mg  - Alpha leporic acid 50 mg  - huperzine-a 50 mcg     oxyCODONE-acetaminophen 5-325 MG per tablet  Commonly known as:  PERCOCET/ROXICET  Take 1-2 tablets by mouth every 4 (four) hours as needed for severe pain.     warfarin 4 MG tablet  Commonly known as:  COUMADIN  Take 4-6 mg by mouth daily. 4 mg every day except Tuesdays, Thursdays and Saturdays and takes 6 mg on those days       Follow-up Information    Follow up with Donaciano Eva, MD On 05/31/2014.   Specialty:  Obstetrics and Gynecology   Why:  at 10:45am at the [facility] for follow up   Contact information:   [place] [address] [phone]       Greater than thirty minutes were spend for face to face discharge instructions and discharge orders/summary in EPIC.   Signed: CROSS, MELISSA DEAL 05/19/2014, 1:16 PM

## 2014-05-19 NOTE — Discharge Instructions (Signed)
05/19/2014  Return to work: 4-6 weeks if applicable  Activity: 1. Be up and out of the bed during the day.  Take a nap if needed.  You may walk up steps but be careful and use the hand rail.  Stair climbing will tire you more than you think, you may need to stop part way and rest.   2. No lifting or straining for 6 weeks.  3. No driving for 1 week(s).  Do not drive if you are taking narcotic pain medicine.  4. Shower daily.  Use soap and water on your incision and pat dry; don't rub.  No tub baths until cleared by your surgeon.   5. No sexual activity and nothing in the vagina for 8 weeks.  Diet: 1. Low sodium Heart Healthy Diet is recommended.  2. It is safe to use a laxative, such as Miralax or Colace, if you have difficulty moving your bowels.   Wound Care: 1. Keep clean and dry.  Shower daily.  Reasons to call the Doctor:  Fever - Oral temperature greater than 100.4 degrees Fahrenheit  Foul-smelling vaginal discharge  Difficulty urinating  Nausea and vomiting  Increased pain at the site of the incision that is unrelieved with pain medicine.  Difficulty breathing with or without chest pain  New calf pain especially if only on one side  Sudden, continuing increased vaginal bleeding with or without clots.   Contacts: For questions or concerns you should contact:  Dr. Everitt Amber at [phone]  Joylene John, NP at [phone]  After Hours: call [phone] and ask for the GYN Oncologist on call  Acetaminophen; Oxycodone tablets What is this medicine? ACETAMINOPHEN; OXYCODONE (a set a MEE noe fen; ox i KOE done) is a pain reliever. It is used to treat mild to moderate pain. This medicine may be used for other purposes; ask your health care provider or pharmacist if you have questions. COMMON BRAND NAME(S): Endocet, Magnacet, Narvox, Percocet, Perloxx, Primalev, Primlev, Roxicet, Xolox What should I tell my health care provider before I take this  medicine? They need to know if you have any of these conditions: -brain tumor -Crohn's disease, inflammatory bowel disease, or ulcerative colitis -drug abuse or addiction -head injury -heart or circulation problems -if you often drink alcohol -kidney disease or problems going to the bathroom -liver disease -lung disease, asthma, or breathing problems -an unusual or allergic reaction to acetaminophen, oxycodone, other opioid analgesics, other medicines, foods, dyes, or preservatives -pregnant or trying to get pregnant -breast-feeding How should I use this medicine? Take this medicine by mouth with a full glass of water. Follow the directions on the prescription label. Take your medicine at regular intervals. Do not take your medicine more often than directed. Talk to your pediatrician regarding the use of this medicine in children. Special care may be needed. Patients over 70 years old may have a stronger reaction and need a smaller dose. Overdosage: If you think you have taken too much of this medicine contact a poison control center or emergency room at once. NOTE: This medicine is only for you. Do not share this medicine with others. What if I miss a dose? If you miss a dose, take it as soon as you can. If it is almost time for your next dose, take only that dose. Do not take double or extra doses. What may interact with this medicine? -alcohol -antihistamines -barbiturates like amobarbital, butalbital, butabarbital, methohexital, pentobarbital, phenobarbital, thiopental, and secobarbital -benztropine -drugs for bladder problems  like solifenacin, trospium, oxybutynin, tolterodine, hyoscyamine, and methscopolamine -drugs for breathing problems like ipratropium and tiotropium -drugs for certain stomach or intestine problems like propantheline, homatropine methylbromide, glycopyrrolate, atropine, belladonna, and dicyclomine -general anesthetics like etomidate, ketamine, nitrous oxide,  propofol, desflurane, enflurane, halothane, isoflurane, and sevoflurane -medicines for depression, anxiety, or psychotic disturbances -medicines for sleep -muscle relaxants -naltrexone -narcotic medicines (opiates) for pain -phenothiazines like perphenazine, thioridazine, chlorpromazine, mesoridazine, fluphenazine, prochlorperazine, promazine, and trifluoperazine -scopolamine -tramadol -trihexyphenidyl This list may not describe all possible interactions. Give your health care provider a list of all the medicines, herbs, non-prescription drugs, or dietary supplements you use. Also tell them if you smoke, drink alcohol, or use illegal drugs. Some items may interact with your medicine. What should I watch for while using this medicine? Tell your doctor or health care professional if your pain does not go away, if it gets worse, or if you have new or a different type of pain. You may develop tolerance to the medicine. Tolerance means that you will need a higher dose of the medication for pain relief. Tolerance is normal and is expected if you take this medicine for a long time. Do not suddenly stop taking your medicine because you may develop a severe reaction. Your body becomes used to the medicine. This does NOT mean you are addicted. Addiction is a behavior related to getting and using a drug for a non-medical reason. If you have pain, you have a medical reason to take pain medicine. Your doctor will tell you how much medicine to take. If your doctor wants you to stop the medicine, the dose will be slowly lowered over time to avoid any side effects. You may get drowsy or dizzy. Do not drive, use machinery, or do anything that needs mental alertness until you know how this medicine affects you. Do not stand or sit up quickly, especially if you are an older patient. This reduces the risk of dizzy or fainting spells. Alcohol may interfere with the effect of this medicine. Avoid alcoholic drinks. There  are different types of narcotic medicines (opiates) for pain. If you take more than one type at the same time, you may have more side effects. Give your health care provider a list of all medicines you use. Your doctor will tell you how much medicine to take. Do not take more medicine than directed. Call emergency for help if you have problems breathing. The medicine will cause constipation. Try to have a bowel movement at least every 2 to 3 days. If you do not have a bowel movement for 3 days, call your doctor or health care professional. Do not take Tylenol (acetaminophen) or medicines that have acetaminophen with this medicine. Too much acetaminophen can be very dangerous. Many nonprescription medicines contain acetaminophen. Always read the labels carefully to avoid taking more acetaminophen. What side effects may I notice from receiving this medicine? Side effects that you should report to your doctor or health care professional as soon as possible: -allergic reactions like skin rash, itching or hives, swelling of the face, lips, or tongue -breathing difficulties, wheezing -confusion -light headedness or fainting spells -severe stomach pain -unusually weak or tired -yellowing of the skin or the whites of the eyes Side effects that usually do not require medical attention (report to your doctor or health care professional if they continue or are bothersome): -dizziness -drowsiness -nausea -vomiting This list may not describe all possible side effects. Call your doctor for medical advice about side effects. You  may report side effects to FDA at 1-800-FDA-1088. Where should I keep my medicine? Keep out of the reach of children. This medicine can be abused. Keep your medicine in a safe place to protect it from theft. Do not share this medicine with anyone. Selling or giving away this medicine is dangerous and against the law. Store at room temperature between 20 and 25 degrees C (68 and 77  degrees F). Keep container tightly closed. Protect from light. This medicine may cause accidental overdose and death if it is taken by other adults, children, or pets. Flush any unused medicine down the toilet to reduce the chance of harm. Do not use the medicine after the expiration date. NOTE: This sheet is a summary. It may not cover all possible information. If you have questions about this medicine, talk to your doctor, pharmacist, or health care provider.  2015, Elsevier/Gold Standard. (2012-11-17 13:17:35)  Abdominal Hysterectomy, Care After These instructions give you information on caring for yourself after your procedure. Your doctor may also give you more specific instructions. Call your doctor if you have any problems or questions after your procedure.  HOME CARE It takes 4-6 weeks to recover from this surgery. Follow all of your doctor's instructions.   Only take medicines as told by your doctor.  Change your bandage as told by your doctor.  Return to your doctor to have your stitches taken out.  Take showers for 2-3 weeks. Ask your doctor when it is okay to shower.  Do not douche, use tampons, or have sex (intercourse) for at least 6 weeks or as told.  Follow your doctor's advice about exercise, lifting objects, driving, and general activities.  Get plenty of rest and sleep.  Do not lift anything heavier than a gallon of milk (about 10 pounds [4.5 kilograms]) for the first month after surgery.  Get back to your normal diet as told by your doctor.  Do not drink alcohol until your doctor says it is okay.  Take a medicine to help you poop (laxative) as told by your doctor.  Eating foods high in fiber may help you poop. Eat a lot of raw fruits and vegetables, whole grains, and beans.  Drink enough fluids to keep your pee (urine) clear or pale yellow.  Have someone help you at home for 1-2 weeks after your surgery.  Keep follow-up doctor visits as told. GET HELP  IF:  You have chills or fever.  You have puffiness, redness, or pain in area of the cut (incision).  You have yellowish-white fluid (pus) coming from the cut.  You have a bad smell coming from the cut or bandage.  Your cut pulls apart.  You feel dizzy or light-headed.  You have pain or bleeding when you pee.  You keep having watery poop (diarrhea).  You keep feeling sick to your stomach (nauseous) or keep throwing up (vomiting).  You have fluid (discharge) coming from your vagina.  You have a rash.  You have a reaction to your medicine.  You need stronger pain medicine. GET HELP RIGHT AWAY IF:   You have a fever and your symptoms suddenly get worse.  You have bad belly (abdominal) pain.  You have chest pain.  You are short of breath.  You pass out (faint).  You have pain, puffiness, or redness of your leg.  You bleed a lot from your vagina and notice clumps of tissue (clots). MAKE SURE YOU:   Understand these instructions.  Will watch your  condition.  Will get help right away if you are not doing well or get worse. Document Released: 01/03/2008 Document Revised: 03/31/2013 Document Reviewed: 01/16/2013 [facility] Patient Information 2015 [place], [place]. This information is not intended to replace advice given to you by your health care provider. Make sure you discuss any questions you have with your health care provider.

## 2014-05-20 ENCOUNTER — Telehealth: Payer: Self-pay | Admitting: *Deleted

## 2014-05-20 NOTE — Telephone Encounter (Signed)
Pt was on TCM list d/c 05/19/14 dx endometrial cancer had a robotic assisted total hysterectomy. Will f/u with GYN Dr. Everitt Amber... Did not make tcm appt...Johny Chess

## 2014-05-21 ENCOUNTER — Telehealth: Payer: Self-pay | Admitting: Gynecologic Oncology

## 2014-05-21 NOTE — Telephone Encounter (Signed)
-----   Message from Everitt Amber, MD sent at 05/21/2014 11:11 AM EST ----- Would you mind letting Ms Weinheimer know that her cancer is a stage IA and she does not require additional therapy after she has healed from surgery. Thanks Terrence Dupont

## 2014-05-21 NOTE — Telephone Encounter (Signed)
Patient informed of path results.  Doing well post-operatively.  Advised to call for any questions or concerns.

## 2014-05-31 ENCOUNTER — Encounter: Payer: Self-pay | Admitting: Gynecologic Oncology

## 2014-05-31 ENCOUNTER — Ambulatory Visit: Payer: Medicare Other | Attending: Gynecologic Oncology | Admitting: Gynecologic Oncology

## 2014-05-31 VITALS — BP 114/55 | HR 61 | Resp 18 | Ht 64.0 in | Wt 249.7 lb

## 2014-05-31 DIAGNOSIS — C541 Malignant neoplasm of endometrium: Secondary | ICD-10-CM

## 2014-05-31 DIAGNOSIS — Z90722 Acquired absence of ovaries, bilateral: Secondary | ICD-10-CM | POA: Diagnosis not present

## 2014-05-31 DIAGNOSIS — Z90710 Acquired absence of both cervix and uterus: Secondary | ICD-10-CM | POA: Diagnosis not present

## 2014-05-31 DIAGNOSIS — Z08 Encounter for follow-up examination after completed treatment for malignant neoplasm: Secondary | ICD-10-CM | POA: Diagnosis not present

## 2014-05-31 DIAGNOSIS — Z8542 Personal history of malignant neoplasm of other parts of uterus: Secondary | ICD-10-CM | POA: Insufficient documentation

## 2014-05-31 DIAGNOSIS — Z483 Aftercare following surgery for neoplasm: Secondary | ICD-10-CM

## 2014-05-31 DIAGNOSIS — C50919 Malignant neoplasm of unspecified site of unspecified female breast: Secondary | ICD-10-CM

## 2014-05-31 NOTE — Patient Instructions (Signed)
Plan to follow up in six months or sooner if needed.  Please call closer to the date to schedule your appointment.

## 2014-05-31 NOTE — Progress Notes (Signed)
POSTOPERATIVE FOLLOWUP VISIT  HPI:  Breanna Hernandez is a 69 y.o. year old initially seen in consultation, referred by Dr Christophe Louis, on 05/31/14 for grade 2 endometrial cancer.  She then underwent a robotic hysterectomy, BSO, pelvic lymphadenectomy on 09/09/11 without complications.  Her postoperative course was uncomplicated.  Her final pathologic diagnosis is a Stage IA Grade 1 endometrioid endometrial cancer with no lymphovascular space invasion, 4/14 mm (33%) of myometrial invasion and negative lymph nodes.  She is seen today for a postoperative check and to discuss her pathology results and treatment plan.  Since discharge from the hospital, she is feeling well.  She has improving appetite, normal bowel and bladder function, and pain controlled with minimal PO medication. She had experienced sensation of vaginal/pelvic fullness and pressure 1 week ago which has resolved with time. She has no other complaints today.    Review of systems: Constitutional:  She has no weight gain or weight loss. She has no fever or chills. Eyes: No blurred vision Ears, Nose, Mouth, Throat: No dizziness, headaches or changes in hearing. No mouth sores. Cardiovascular: No chest pain, palpitations or edema. Respiratory:  No shortness of breath, wheezing or cough Gastrointestinal: She has normal bowel movements without diarrhea or constipation. She denies any nausea or vomiting. She denies blood in her stool or heart burn. Genitourinary:  She denies pelvic pain, pelvic pressure or changes in her urinary function. She has no hematuria, dysuria, or incontinence. She has no irregular vaginal bleeding or vaginal discharge Musculoskeletal: Denies muscle weakness or joint pains.  Skin:  She has no skin changes, rashes or itching Neurological:  Denies dizziness or headaches. No neuropathy, no numbness or tingling. Psychiatric:  She denies depression or anxiety. Hematologic/Lymphatic:   No easy bruising or bleeding   Physical  Exam: Blood pressure 114/55, pulse 61, resp. rate 18, height 5\' 4"  (1.626 m), weight 249 lb 11.2 oz (113.263 kg). General: Well dressed, well nourished in no apparent distress.   HEENT:  Normocephalic and atraumatic, no lesions.  Extraocular muscles intact. Sclerae anicteric. Pupils equal, round, reactive. No mouth sores or ulcers. Thyroid is normal size, not nodular, midline. Skin:  No lesions or rashes. Breasts:  Soft, symmetric.  No skin or nipple changes.  No palpable LN or masses. Lungs:  Clear to auscultation bilaterally.  No wheezes. Cardiovascular:  Regular rate and rhythm.  No murmurs or rubs. Abdomen:  Soft, nontender, nondistended.  No palpable masses.  No hepatosplenomegaly.  No ascites. Normal bowel sounds.  No hernias.  Incisions are healing well. Genitourinary: Normal EGBUS  Vaginal cuff intact.  No bleeding or discharge.  No cul de sac fullness. Extremities: No cyanosis, clubbing or edema.  No calf tenderness or erythema. No palpable cords. Psychiatric: Mood and affect are appropriate. Neurological: Awake, alert and oriented x 3. Sensation is intact, no neuropathy.  Musculoskeletal: No pain, normal strength and range of motion.  Assessment:    69 y.o. year old with Stage IA Grade 1 endometrioid endometrial cancer.   S/p robotic hysterectomy, BSO, pelvic lymphadenectomy on 05/18/14. no LVSI, 33% myometrial invasion, negative pelvic washings and negative lymph nodes.   Plan: 1) Pathology reports reviewed today 2) Treatment counseling - Very low risk (<5%) for recurrence given age, grade, depth of myometrial invasion and LVSI status. Multidisciplinary tumor board recommendation is for routine surveillance with frequent pelvic exams and visits with annual pap smear.  We will start with visits every 6 months x5 years, at which time she can return to  annual visits. In accordance with new SGO guidelines, pap smears are not indicated as part of this visit.  Discussed signs and symptoms of  recurrence including vaginal bleeding or discharge, leg pain or swelling and changes in bowel or bladder habits. She was given the opportunity to ask questions, which were answered to her satisfaction, and she is agreement with the above mentioned plan of care.  3)  Return to clinic in 6 months. She will alternate her followup care between myself and Dr Landry Mellow.  Donaciano Eva, MD  CC: Dr Christophe Louis

## 2014-06-01 ENCOUNTER — Telehealth: Payer: Self-pay | Admitting: Genetic Counselor

## 2014-06-01 NOTE — Telephone Encounter (Signed)
Pt confirmed appt on 06/10/14 at 2:30pm for Genetic  Dx: Genetic testing Referring Dr. Elinor Parkinson

## 2014-06-02 ENCOUNTER — Ambulatory Visit (INDEPENDENT_AMBULATORY_CARE_PROVIDER_SITE_OTHER): Payer: Medicare Other | Admitting: Pharmacist Clinician (PhC)/ Clinical Pharmacy Specialist

## 2014-06-02 DIAGNOSIS — Z7901 Long term (current) use of anticoagulants: Secondary | ICD-10-CM

## 2014-06-02 DIAGNOSIS — I4891 Unspecified atrial fibrillation: Secondary | ICD-10-CM

## 2014-06-02 LAB — POCT INR: INR: 1.8

## 2014-06-10 ENCOUNTER — Other Ambulatory Visit: Payer: Medicare Other

## 2014-06-22 ENCOUNTER — Telehealth: Payer: Self-pay | Admitting: Cardiovascular Disease

## 2014-06-22 NOTE — Telephone Encounter (Signed)
Left message for patient on name-verified VM that Dr. Loletha Grayer cannot refill lorazepam (non-cardiac medication) and that she should contact PCP. Also left reminder that yearly office visit will be due soon.   Lorazepam was refilled #30 with 0 refills in May 2015 with note to get refills from PCP

## 2014-06-22 NOTE — Telephone Encounter (Signed)
Best referred to PCP

## 2014-06-22 NOTE — Telephone Encounter (Signed)
°  1. Which medications need to be refilled? Lorazepam-Dr C refill this the last time and Dr Rex Kras started her on this.She never got this from a primary doctor. 2. Which pharmacy is medication to be sent to?[facility]-[phone]  3. Do they need a 30 day or 90 day supply? 30  4. Would they like a call back once the medication has been sent to the pharmacy? yes

## 2014-06-22 NOTE — Telephone Encounter (Signed)
Deferred to Dr. Loletha Grayer to advise on med refill  No recent refill for lorazepam by Dr. Loletha Grayer in [facility] Last refill noted by Dr. Linna Darner Patient is due for 1 year visit in April - has not made appointment

## 2014-06-24 ENCOUNTER — Other Ambulatory Visit: Payer: Medicare Other

## 2014-06-24 ENCOUNTER — Ambulatory Visit (HOSPITAL_BASED_OUTPATIENT_CLINIC_OR_DEPARTMENT_OTHER): Payer: Medicare Other | Admitting: Genetic Counselor

## 2014-06-24 ENCOUNTER — Telehealth: Payer: Self-pay | Admitting: *Deleted

## 2014-06-24 DIAGNOSIS — C541 Malignant neoplasm of endometrium: Secondary | ICD-10-CM | POA: Insufficient documentation

## 2014-06-24 DIAGNOSIS — C50919 Malignant neoplasm of unspecified site of unspecified female breast: Secondary | ICD-10-CM

## 2014-06-24 NOTE — Telephone Encounter (Signed)
Met patient in lobby at [facility] today - she is inquiring if recent surgery involving removing her ovaries could cause her heart to skip beats. Patient states she does have a cardiologist and this has happened to her years ago. Told patient I will discuss this with Melissa Cross,NP and call her back on her cell phone since she is finished with appts at [facility] for the day.  Per Joylene John, NP - the recent surgery should not be causing this as patient was already post-menopausal and her fat cells are still producing some estrogen. Patient needs to see PCP or cardiologist. Called patient and let her know this - patient is agreeable to go ahead and call her cardiologist and let them know what is going on and schedule an appt. Told patient if she has any further concerns to please call our office back.

## 2014-06-24 NOTE — Progress Notes (Signed)
[facility] Patient Visit  REFERRING PROVIDER: Hendricks Limes, MD [place], [place] [address]  PRIMARY PROVIDER:  Unice Cobble, MD  PRIMARY REASON FOR VISIT:  Personal history of multiple breast cancer primaries and endometrial cancer.   HISTORY OF PRESENT ILLNESS:   Breanna Hernandez, a 69 y.o. female, was seen for a [facility] cancer genetics consultation at the request of Dr. Linna Darner due to a personal history of cancer.  Breanna Hernandez presents to clinic today to discuss the possibility of a hereditary predisposition to cancer, genetic testing, and to further clarify her future cancer risks, as well as potential cancer risks for family members.   CANCER HISTORY:  Breanna Hernandez has a history of multiple primary breast cancers. 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 She started on hormonal therapy with Aromasin which she stopped due to excessive weight gain. Of note, she took it for less than a year. Most recent mammogram done 04/18/2012 showed a 4 mm nodular density upper inner quadrant left breast which was not seen on ultrasound. 6 month interval study recommended by the radiologist including ultrasound were normal.  More recently, Breanna Hernandez was diagnosed with grade 2 endometrial cancer. She underwent a robotic hysterectomy, BSO, pelvic lymphadenectomy on 11/13/84 without complications. Her postoperative course was uncomplicated. Her final pathologic diagnosis is a Stage IA Grade 1 endometrioid endometrial cancer with no lymphovascular space invasion, 4/14 mm (33%) of myometrial invasion and negative lymph  nodes.  HORMONAL RISK FACTORS:  Menarche was at age 32.  First live birth at age 52.  OCP use for approximately 0 years.  Ovaries intact: no.  Hysterectomy: yes.  Menopausal status: postmenopausal.  HRT use: 0 years.  Past Medical History  Diagnosis Date   HTN (hypertension)    Hypothyroidism    History of aortic dissection 05/03/10    type 1   Diverticulosis    Hyperplastic colon polyp    Hyperlipidemia    Obesity    IBS (irritable bowel syndrome)    Atrial fibrillation     h/o post-op 04/2010   Renal cyst, left    Fatty liver 01/10/07   Breast cancer     x 3 cancer - radiation right x2 ''00,'05, left '07 mammosite radiation    Lower extremity neuropathy     occ. 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    Past Surgical History  Procedure Laterality Date   Cholecystectomy     Tonsillectomy     Colonoscopy w/ polypectomy     Breast lumpectomy  2000    Right x2, Left x1   Median sternotomy, extracorporeal circulation, repair of type 1 aortic dissection with  tube graft from  sinotubular junction to take off of innominate artery, distal anastomoses under deep bypothermic circulatory arrest  05/03/2010    Dr Roxan Hockey   Cabg x 1  05/03/2010    RCA -Dr Roxan Hockey (with type 1 aortic dissection)   Hysteroscopy w/d&c N/A 04/13/2014    Procedure: DILATATION AND CURETTAGE /HYSTEROSCOPY Polypectomy;  Surgeon: Maeola Sarah. Landry Mellow, MD;  Location: [facility];  Service: Gynecology;  Laterality: N/A;  Possible Polypectomy   Robotic assisted total hysterectomy with bilateral salpingo oopherectomy Bilateral 05/18/2014    Procedure: ROBOTIC ASSISTED TOTAL HYSTERECTOMY WITH BILATERAL SALPINGO OOPHORECTOMY SENTINAL LYMPH NODE MAPPING ;  Surgeon: Everitt Amber, MD;  Location: [facility];  Service: Gynecology;  Laterality: Bilateral;    History   Social History   Marital Status: Married    Spouse Name: N/A    Number of Children: N/A   Years of Education: N/A   Social History Main Topics   Smoking status: Never Smoker    Smokeless tobacco: Never Used   Alcohol Use: No   Drug Use: No   Sexual Activity: Yes   Other Topics Concern   Not on file   Social History Narrative     FAMILY HISTORY:  During the visit, a 4-generation pedigree was obtained. A copy of the pedigree with be scanned into Epic under the Media tab. Significant family history diagnoses include the following: Family History  Problem Relation Age of Onset   Aneurysm Father     CNS   Heart attack Father     x2   Alzheimer's disease Mother    Hypertension Mother    Goiter Mother    Hypertension Brother    Colon cancer Neg Hx    Breanna Hernandez ancestry is of Caucasian descent. There is no known Jewish ancestry or consanguinity.  GENETIC COUNSELING ASSESSMENT:  Breanna Hernandez is a 69 y.o. female with a personal history of cancer suggestive of a hereditary predisposition to cancer. We, therefore, discussed and recommended the following at today's visit.   DISCUSSION:  We reviewed the characteristics, features and inheritance patterns of hereditary cancer syndromes. We also discussed genetic testing, including the appropriate family members to test, the process of testing, insurance coverage and turn-around-time for results. We discussed the implications of a negative, positive and/or variant of uncertain significant result. Although her family history is reassuring, given her multiple primary cancer sites, we recommended Breanna Hernandez pursue genetic testing for the OvaNext gene panel. The OvaNext gene panel offered by [employer] includes sequencing and rearrangement analysis for the following 24 genes:ATM, BARD1, BRCA1, BRCA2, BRIP1, CDH1, CHEK2, EPCAM, MLH1, MRE11A, MSH2, MSH6, MUTYH, NBN, NF1, PALB2, PMS2, PTEN, RAD50, RAD51C, RAD51D, SMARCA4, STK11, and TP53.  PLAN:  Based on our above recommendation, Breanna Hernandez  wished to pursue genetic testing and the blood sample was drawn and will be sent to [facility] for analysis. Results should be available within approximately 4 weeks time, at which point they will be disclosed by telephone to Breanna Hernandez, as will any additional recommendations warranted by these results. Lastly, we encouraged Breanna Hernandez to remain in contact with cancer genetics annually so that we can continuously update the family history and inform her of any changes in cancer genetics and testing that may be of benefit for this family.   Ms.  Hernandez questions were answered to her satisfaction today. Our contact information was provided should additional questions or concerns arise. Thank you for the referral and allowing [place] to share in the care of your patient.   Catherine A. Fine, MS, CGC Certified Psychologist, [email or web address] phone: [phone]  The patient was seen for a total of 40 minutes in face-to-face genetic counseling.  This patient was discussed with Dr. Denman George who agrees with the above.    ______________________________________________________________________ For Office Staff:  Number of people involved in session including genetic counselor: 2 Was an intern or student  involved with case: not applicable

## 2014-06-28 ENCOUNTER — Other Ambulatory Visit: Payer: Self-pay

## 2014-06-28 DIAGNOSIS — E039 Hypothyroidism, unspecified: Secondary | ICD-10-CM

## 2014-06-28 MED ORDER — LEVOTHYROXINE SODIUM 75 MCG PO TABS: 75.0000 ug | ORAL_TABLET | Freq: Every day | ORAL | Status: DC

## 2014-06-30 ENCOUNTER — Ambulatory Visit (INDEPENDENT_AMBULATORY_CARE_PROVIDER_SITE_OTHER): Payer: Medicare Other | Admitting: Pharmacist Clinician (PhC)/ Clinical Pharmacy Specialist

## 2014-06-30 DIAGNOSIS — Z7901 Long term (current) use of anticoagulants: Secondary | ICD-10-CM

## 2014-06-30 DIAGNOSIS — I4891 Unspecified atrial fibrillation: Secondary | ICD-10-CM | POA: Diagnosis not present

## 2014-06-30 LAB — POCT INR: INR: 2

## 2014-07-14 ENCOUNTER — Other Ambulatory Visit: Payer: Self-pay | Admitting: Cardiovascular Disease

## 2014-07-14 NOTE — Telephone Encounter (Signed)
Rx(s) sent to pharmacy electronically.  

## 2014-07-20 ENCOUNTER — Encounter: Payer: Self-pay | Admitting: Genetic Counselor

## 2014-07-20 DIAGNOSIS — C541 Malignant neoplasm of endometrium: Secondary | ICD-10-CM

## 2014-07-20 DIAGNOSIS — C50919 Malignant neoplasm of unspecified site of unspecified female breast: Secondary | ICD-10-CM

## 2014-07-20 NOTE — Progress Notes (Signed)
[facility]  Genetic Test Results   REFERRING PROVIDER: Dr. Everitt Amber  PRIMARY PROVIDER:  Unice Cobble, MD  PRIMARY REASON FOR VISIT:  Personal history of endometrial and breast cancer  GENETIC TEST RESULT:  Testing Laboratory: [facility]  Test Ordered: OvaNext gene panel Date of Report: 07/14/2014 Result: Normal, no pathogenic mutations identified General Interpretation: Reassuring  HPI: Ms. Charles was previously seen in the [facility] due to concerns regarding a hereditary predisposition to cancer. Please refer to our prior cancer genetics clinic note for more information regarding Ms. Deguire medical, social and family histories, and our assessment and recommendations, at the time. Ms. Treiber genetic test results and recommendations warranted by these results were recently disclosed to her and are discussed in more detail below.  GENETIC TEST RESULTS: At the time of Ms. Alcindor's visit, we recommended she pursue genetic testing, which includes sequencing and deletion/duplication analysis of several genes associated with an increased risk for cancer via a gene panel. The OvaNext gene panel offered by [employer] includes sequencing and rearrangement analysis for the following 24 genes:ATM, BARD1, BRCA1, BRCA2, BRIP1, CDH1, CHEK2, EPCAM, MLH1, MRE11A, MSH2, MSH6, MUTYH, NBN, NF1, PALB2, PMS2, PTEN, RAD50, RAD51C, RAD51D, SMARCA4, STK11, and TP53. Genetic testing for this gene panel was normal and did not reveal a pathogenic mutation in any of these genes. A copy of the genetic test report will be scanned into Epic under the media tab.   We discussed with Ms. Cassata that current genetic testing is not perfect, and it is, therefore, possible there may be a pathogenic gene mutation in one of these genes that current testing cannot detect, but that chance is small.  We also discussed, that it is possible that another  gene that has not yet been discovered, or that we have not yet tested, is responsible for the cancer diagnoses in her family. It is, therefore, important for Ms. Regner to continue to remain in touch with cancer genetics so that we can continue to offer Ms. Spagna the most up to date genetic testing.   CANCER SCREENING RECOMMENDATIONS: This result is reassuring and indicates that Ms. Ketcher likely does not have an increased risk for a future cancer due to a mutation in one of these genes. This normal test also suggests that Ms. Eder cancer was most likely not due to an inherited predisposition associated with one of these genes.  Most cancers happen by chance and this negative test suggests that her cancer falls into this category.  We, therefore, recommended she continue to follow the cancer management and screening guidelines provided by her oncology and primary healthcare provider.   RECOMMENDATIONS FOR FAMILY MEMBERS:  Individuals in this family might be at some increased risk of developing cancer, over the general population risk, simply due to the family history of cancer.  We recommended women in this family have a yearly mammogram beginning at age 62, an annual clinical breast exam, and perform monthly breast self-exams. Women in this family should also have a gynecological exam as recommended by their primary provider. All family members should have a colonoscopy by age 28, an annual dermatological exam and continue to follow cancer screening guidelines recommended by their healthcare provider.  FOLLOW-UP: Lastly, we discussed with Ms. Saal that cancer genetics is a rapidly advancing field and it is likely that new genetic tests will be appropriate for her and/or family members in the future. We encouraged her to  remain in contact with cancer genetics on an annual basis so we can update her personal and family histories and let her know of advances in cancer genetics that may benefit this  family.   Our contact number was provided. Ms. Escalante questions were answered to her satisfaction, and she knows she is welcome to call us at anytime with additional questions or concerns.    Catherine A. Fine, MS, CGC Certified Psychologist, [email or web address] Phone: [phone]

## 2014-07-28 ENCOUNTER — Ambulatory Visit: Payer: Medicare Other | Admitting: Pharmacist Clinician (PhC)/ Clinical Pharmacy Specialist

## 2014-07-28 ENCOUNTER — Telehealth: Payer: Self-pay | Admitting: Pharmacist Clinician (PhC)/ Clinical Pharmacy Specialist

## 2014-07-29 NOTE — Telephone Encounter (Signed)
Close encounter 

## 2014-07-31 ENCOUNTER — Other Ambulatory Visit: Payer: Self-pay | Admitting: Cardiovascular Disease

## 2014-08-02 NOTE — Telephone Encounter (Signed)
Rx(s) sent to pharmacy electronically.  

## 2014-08-04 ENCOUNTER — Encounter: Payer: Self-pay | Admitting: Cardiovascular Disease

## 2014-08-04 ENCOUNTER — Ambulatory Visit (INDEPENDENT_AMBULATORY_CARE_PROVIDER_SITE_OTHER): Payer: Medicare Other | Admitting: Pharmacist Clinician (PhC)/ Clinical Pharmacy Specialist

## 2014-08-04 DIAGNOSIS — I4891 Unspecified atrial fibrillation: Secondary | ICD-10-CM

## 2014-08-04 DIAGNOSIS — Z7901 Long term (current) use of anticoagulants: Secondary | ICD-10-CM

## 2014-08-04 LAB — POCT INR: INR: 2.2

## 2014-08-05 ENCOUNTER — Telehealth: Payer: Self-pay | Admitting: Pharmacist Clinician (PhC)/ Clinical Pharmacy Specialist

## 2014-08-05 MED ORDER — METOPROLOL TARTRATE 50 MG PO TABS: ORAL_TABLET | ORAL | Status: DC

## 2014-08-05 NOTE — Telephone Encounter (Signed)
-----   Message from Sanda Klein, MD sent at 08/04/2014  5:52 PM EDT ----- Please let her know there is little room to adjust meds empirically since her heart rate is relatively slow, around 60,. Let's just try an extra 25 mg of metoprolol with her evening dose. If that does not help, will ask her to wear a monitor again. MCr ----- Message -----    From: Rockne Menghini, RPH-CPP    Sent: 08/04/2014   3:43 PM      To: Sanda Klein, MD  Pt in office today for INR check.  States has noticed an increase in the number of skipped heart beats.  Especially notices after meals and when she lies (lays?) down.  Wants to know if she needs to adjust meds or wear another monitor or ....    Erasmo Downer

## 2014-08-05 NOTE — Telephone Encounter (Signed)
Spoke with patient, explained increase in dose.  If no improvement in next 2 weeks she is to call back and let Dr. Lurline Del RN know.  Pt voiced understanding

## 2014-08-17 ENCOUNTER — Other Ambulatory Visit: Payer: Self-pay | Admitting: Cardiovascular Disease

## 2014-08-17 ENCOUNTER — Ambulatory Visit (INDEPENDENT_AMBULATORY_CARE_PROVIDER_SITE_OTHER): Payer: Medicare Other | Admitting: Internal Medicine

## 2014-08-17 ENCOUNTER — Other Ambulatory Visit (INDEPENDENT_AMBULATORY_CARE_PROVIDER_SITE_OTHER): Payer: Medicare Other

## 2014-08-17 ENCOUNTER — Other Ambulatory Visit: Payer: Self-pay | Admitting: Internal Medicine

## 2014-08-17 ENCOUNTER — Encounter: Payer: Self-pay | Admitting: Internal Medicine

## 2014-08-17 VITALS — BP 120/80 | HR 56 | Temp 97.9°F | Ht 64.0 in | Wt 242.0 lb

## 2014-08-17 DIAGNOSIS — E785 Hyperlipidemia, unspecified: Secondary | ICD-10-CM | POA: Diagnosis not present

## 2014-08-17 DIAGNOSIS — I10 Essential (primary) hypertension: Secondary | ICD-10-CM | POA: Diagnosis not present

## 2014-08-17 DIAGNOSIS — R7301 Impaired fasting glucose: Secondary | ICD-10-CM

## 2014-08-17 LAB — TSH: TSH: 3.11 u[IU]/mL (ref 0.35–4.50)

## 2014-08-17 LAB — HEMOGLOBIN A1C: Hgb A1c MFr Bld: 5.8 % (ref 4.6–6.5)

## 2014-08-17 LAB — CK: CK TOTAL: 51 U/L (ref 7–177)

## 2014-08-17 MED ORDER — LORAZEPAM 1 MG PO TABS: ORAL_TABLET | ORAL | Status: DC

## 2014-08-17 NOTE — Assessment & Plan Note (Signed)
A1c

## 2014-08-17 NOTE — Assessment & Plan Note (Signed)
Blood pressure goals reviewed.  

## 2014-08-17 NOTE — Telephone Encounter (Signed)
Rx(s) sent to pharmacy electronically.  

## 2014-08-17 NOTE — Progress Notes (Signed)
Pre visit review using our clinic review tool, if applicable. No additional management support is needed unless otherwise documented below in the visit note. 

## 2014-08-17 NOTE — Assessment & Plan Note (Signed)
NMR Lipoprofile, LFT, TSH, CK 

## 2014-08-17 NOTE — Patient Instructions (Signed)
  Your next office appointment will be determined based upon review of your pending labs.  Those instructions will be transmitted to you by My Chart    Critical results will be called.   Followup as needed for any active or acute issue. Please report any significant change in your symptoms. 

## 2014-08-17 NOTE — Progress Notes (Signed)
   Subjective:    Patient ID: Breanna Hernandez, female    DOB: Aug 17, 1945, 69 y.o.   MRN: [id number]  HPI  The patient is here to assess status of active health conditions.  PMH, FH, & Social History reviewed & updated.  She is not on a heart healthy, low-salt diet. She does not exercise due to neuropathy as well as hip and other joint pains. She's not monitoring blood pressure at home; but @ office visits it is less than 120/80. She has been compliant with her medications without adverse effects  Cardiovascular review of systems is negative except for occasional palpitations and chronic edema of the left lower extremity. The latter was related to previous injury of that ankle remotely.  Strong family history of premature coronary disease in the paternal side of the family. Her father had heart attack in his 77s. There is no diabetes   Colonoscopy was performed 2011; she did have mild diverticulosis; she would be due in 2021. She has no active GI symptoms  Her gynecologic monitor following endometrial cancer is up-to-date.  She does not smoke and does not drink alcohol. She has not had a flu shot since 2013. She's not had shingles, Pneumovax, or Prevnar. This was discussed.   Review of Systems  Chest pain, tachycardia, exertional dyspnea, paroxysmal nocturnal dyspnea, or claudication are absent.  Unexplained weight loss, abdominal pain, significant dyspepsia, dysphagia, melena, rectal bleeding, or persistently small caliber stools are denied.    Objective:   Physical Exam  Pertinent or positive findings include:BMI 41.52 Wart like lesion posterior left ear. No associated inflammation or malignant character.  Hair is very fine.  She appears younger than her stated age.  She has a raspy grade 1 systolic murmur at the left base with left neck radiation.  Abdomen is protuberant.  There is accentuated curvature of the thoracic spine superiorly.  She has trace edema left ankle.    Valgus changes are noted of the knees. She has crepitus bilaterally.  Deep reflexes are 0+ at the knees.   General appearance :adequately nourished; in no distress. Eyes: No conjunctival inflammation or scleral icterus is present. Oral exam:  Lips and gums are healthy appearing.There is no oropharyngeal erythema or exudate noted. Dental hygiene is good. Heart:  Normal rate and regular rhythm. S1 and S2 normal without gallop, click, rub or other extra sounds   Lungs:Chest clear to auscultation; no wheezes, rhonchi,rales ,or rubs present.No increased work of breathing.  Abdomen: bowel sounds normal, soft and non-tender without masses, organomegaly or hernias noted.  No guarding or rebound. Vascular : all pulses equal ; no bruits present. Skin:Warm & dry.  Intact without suspicious lesions or rashes ; no tenting or jaundice  Lymphatic: No lymphadenopathy is noted about the head, neck, axilla Neuro: Strength, tone  normal.        Assessment & Plan:  See Current Assessment & Plan in Problem List under specific Diagnosis

## 2014-08-19 LAB — NMR LIPOPROFILE WITH LIPIDS
Cholesterol, Total: 263 mg/dL — ABNORMAL HIGH (ref 100–199)
HDL Particle Number: 21.7 umol/L — ABNORMAL LOW (ref >=30.5)
HDL Size: 8.7 nm — ABNORMAL LOW (ref >=9.2)
HDL-C: 39 mg/dL — ABNORMAL LOW (ref >39)
LDL CALC: 159 mg/dL — AB (ref 0–99)
LDL Particle Number: 2853 nmol/L — ABNORMAL HIGH (ref <1000)
LDL SIZE: 19.9 nm (ref >=20.8)
LP-IR Score: 76 — ABNORMAL HIGH (ref <=45)
Large HDL-P: 2 umol/L — ABNORMAL LOW (ref >=4.8)
Large VLDL-P: 9.8 nmol/L — ABNORMAL HIGH (ref <=2.7)
SMALL LDL PARTICLE NUMBER: 2113 nmol/L — AB (ref <=527)
Triglycerides: 325 mg/dL — ABNORMAL HIGH (ref 0–149)
VLDL Size: 48 nm — ABNORMAL HIGH (ref <=46.6)

## 2014-08-27 ENCOUNTER — Other Ambulatory Visit: Payer: Self-pay

## 2014-08-27 DIAGNOSIS — Z1231 Encounter for screening mammogram for malignant neoplasm of breast: Secondary | ICD-10-CM

## 2014-09-01 ENCOUNTER — Ambulatory Visit (INDEPENDENT_AMBULATORY_CARE_PROVIDER_SITE_OTHER): Payer: Medicare Other | Admitting: Pharmacist Clinician (PhC)/ Clinical Pharmacy Specialist

## 2014-09-01 DIAGNOSIS — I4891 Unspecified atrial fibrillation: Secondary | ICD-10-CM | POA: Diagnosis not present

## 2014-09-01 DIAGNOSIS — Z7901 Long term (current) use of anticoagulants: Secondary | ICD-10-CM

## 2014-09-01 LAB — POCT INR: INR: 2

## 2014-09-02 ENCOUNTER — Ambulatory Visit
Admission: RE | Admit: 2014-09-02 | Discharge: 2014-09-02 | Disposition: A | Discharge status: Home / Self Care | Payer: Medicare Other | Source: Ambulatory Visit

## 2014-09-02 DIAGNOSIS — Z1231 Encounter for screening mammogram for malignant neoplasm of breast: Secondary | ICD-10-CM

## 2014-09-15 ENCOUNTER — Other Ambulatory Visit: Payer: Self-pay | Admitting: Cardiovascular Disease

## 2014-09-16 NOTE — Telephone Encounter (Signed)
Rx(s) sent to pharmacy electronically.  

## 2014-09-20 ENCOUNTER — Encounter: Payer: Self-pay | Admitting: Family Medicine

## 2014-09-20 ENCOUNTER — Ambulatory Visit (INDEPENDENT_AMBULATORY_CARE_PROVIDER_SITE_OTHER): Payer: Medicare Other | Admitting: Family Medicine

## 2014-09-20 VITALS — BP 118/72 | HR 54 | Temp 98.1°F | Ht 64.0 in | Wt 245.2 lb

## 2014-09-20 DIAGNOSIS — I10 Essential (primary) hypertension: Secondary | ICD-10-CM | POA: Diagnosis not present

## 2014-09-20 DIAGNOSIS — E785 Hyperlipidemia, unspecified: Secondary | ICD-10-CM

## 2014-09-20 DIAGNOSIS — E039 Hypothyroidism, unspecified: Secondary | ICD-10-CM

## 2014-09-20 DIAGNOSIS — R002 Palpitations: Secondary | ICD-10-CM | POA: Diagnosis not present

## 2014-09-20 MED ORDER — ZOSTER VACCINE LIVE 19400 UNT/0.65ML ~~LOC~~ SOLR: 0.6500 mL | Freq: Once | SUBCUTANEOUS | Status: DC

## 2014-09-20 NOTE — Assessment & Plan Note (Signed)
Lab Results  Component Value Date   TSH 3.11 08/17/2014

## 2014-09-20 NOTE — Assessment & Plan Note (Signed)
See previous labs

## 2014-09-20 NOTE — Patient Instructions (Addendum)

## 2014-09-20 NOTE — Progress Notes (Signed)
Patient ID: Breanna Hernandez, female    DOB: 1945-04-29  Age: 69 y.o. MRN: [id number]    Subjective:  Subjective HPI [name] presents to establish and c/o palptations that have been going on for 6 weeks but she has never told anyone before.    Review of Systems  Constitutional: Negative for activity change, appetite change, fatigue and unexpected weight change.  Respiratory: Negative for cough and shortness of breath.   Cardiovascular: Negative for chest pain and palpitations.  Psychiatric/Behavioral: Negative for behavioral problems and dysphoric mood. The patient is not nervous/anxious.     History Past Medical History  Diagnosis Date  . HTN (hypertension)   . Hypothyroidism   . 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 Diverticulosis   . Hyperplastic colon polyp   . Hyperlipidemia   . Obesity   . IBS (irritable bowel syndrome)   . Atrial fibrillation     h/o post-op 04/2010  . Renal cyst, left   . Fatty liver 01/10/07  . Breast cancer     x 3 cancer - radiation right x2 ''00,'05, left '07 mammosite radiation   . Lower extremity neuropathy     occ. affects walking and balance"foot to knees"  . 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    She has past surgical history that includes Cholecystectomy; Tonsillectomy; Colonoscopy w/ polypectomy; Breast lumpectomy (2000); Median sternotomy, extracorporeal circulation, repair of type 1 aortic dissection with  tube graft from  sinotubular junction to take off of innominate artery, distal anastomoses under deep bypothermic circulatory arrest (05/03/2010); CABG x 1 (05/03/2010); Hysteroscopy w/D&C (N/A, 04/13/2014); and Robotic assisted total hysterectomy with bilateral salpingo oophorectomy (Bilateral, 05/18/2014).   Her family history includes Alzheimer's disease in her mother; Aneurysm in her father; Goiter in her mother; Heart attack in her father; Hypertension in her brother and mother; Kidney  disease in her brother. There is no history of Colon cancer.She reports that she has never smoked. She has never used smokeless tobacco. She reports that she does not drink alcohol or use illicit drugs.  Current Outpatient Prescriptions on File Prior to Visit  Medication Sig Dispense Refill  . acetaminophen (TYLENOL) 500 MG tablet Take 1,000 mg by mouth every 6 (six) hours as needed for moderate pain or headache.    Marland Kitchen amLODipine (NORVASC) 5 MG tablet Take 1 tablet (5 mg total) by mouth daily. 30 tablet 0  . aspirin 81 MG tablet Take 81 mg by mouth every morning.     . benazepril (LOTENSIN) 40 MG tablet Take 1 tablet (40 mg total) by mouth daily. NEEDS APPOINTMENT FOR FUTURE REFILLS 30 tablet 0  . CALCIUM-MAGNESIUM-VITAMIN D PO Take 1 tablet by mouth 2 (two) times daily.     . fish oil-omega-3 fatty acids 1000 MG capsule Take 1 g by mouth 2 (two) times daily.     . hydrochlorothiazide (MICROZIDE) 12.5 MG capsule TAKE ONE CAPSULE BY MOUTH ONCE DAILY 90 capsule 3  . hydrocortisone 2.5 % cream Apply 1 application topically daily as needed (groin irritation).   0  . ibuprofen (ADVIL,MOTRIN) 200 MG tablet Take 400 mg by mouth every 6 (six) hours as needed for headache or moderate pain.    Marland Kitchen ketoconazole (NIZORAL) 2 % cream Apply 1 application topically daily as needed for irritation (groin).   2  . levothyroxine (SYNTHROID, LEVOTHROID) 75 MCG tablet Take 1 tablet (75 mcg total) by mouth daily before  breakfast. 90 tablet 3  . LORazepam (ATIVAN) 1 MG tablet 1 by mouth " as needed" and should not be taken on a regular basis. Regular use can increase risk of addiction but more importantly affect level of alertness and balance with increased risk of falling. 30 tablet 0  . metoprolol (LOPRESSOR) 50 MG tablet Take 1 tablet by mouth each morning and 1.5 tablets each evening 225 tablet 3  . OVER THE COUNTER MEDICATION Take 1 tablet by mouth every morning. Product Name: EHT Mind enhancement formula Vitamin d 200  units Vitamin b 1.6 mg Vitamin b-12 10 mcg Selinum 70 mcg Coffee Extract 35 mg Alpha leporic acid 50 mg huperzine-a 50 mcg    . warfarin (COUMADIN) 4 MG tablet Take 4-6 mg by mouth daily. 4 mg every day except Tuesdays, Thursdays and Saturdays and takes 6 mg on those days    . [DISCONTINUED] amLODipine-benazepril (LOTREL) 5-20 MG per capsule Take 1 capsule by mouth daily.      . [DISCONTINUED] escitalopram (LEXAPRO) 10 MG tablet Take 10 mg by mouth daily.      . [DISCONTINUED] lisinopril (PRINIVIL,ZESTRIL) 20 MG tablet Take 20 mg by mouth daily.       No current facility-administered medications on file prior to visit.     Objective:  Objective Physical Exam  Constitutional: She is oriented to person, place, and time. She appears well-developed and well-nourished.  HENT:  Head: Normocephalic and atraumatic.  Eyes: Conjunctivae and EOM are normal.  Neck: Normal range of motion. Neck supple. No JVD present. Carotid bruit is not present. No thyromegaly present.  Cardiovascular: Normal rate, regular rhythm and normal heart sounds.   No murmur heard. Pulmonary/Chest: Effort normal and breath sounds normal. No respiratory distress. She has no wheezes. She has no rales. She exhibits no tenderness.  Musculoskeletal: She exhibits no edema.  Neurological: She is alert and oriented to person, place, and time.  Psychiatric: She has a normal mood and affect.   BP 118/72 mmHg  Pulse 54  Temp(Src) 98.1 F (36.7 C) (Oral)  Ht 5\' 4"  (1.626 m)  Wt 245 lb 3.2 oz (111.222 kg)  BMI 42.07 kg/m2  SpO2 95% Wt Readings from Last 3 Encounters:  09/20/14 245 lb 3.2 oz (111.222 kg)  08/17/14 242 lb (109.77 kg)  05/31/14 249 lb 11.2 oz (113.263 kg)     Lab Results  Component Value Date   WBC 12.9* 05/19/2014   HGB 11.8* 05/19/2014   HCT 34.7* 05/19/2014   PLT 198 05/19/2014   GLUCOSE 154* 05/19/2014   CHOL 263* 08/17/2014   TRIG 325* 08/17/2014   HDL 39* 08/17/2014   LDLDIRECT 148.3  03/29/2006   LDLCALC 159* 08/17/2014   ALT 33 05/13/2014   AST 39* 05/13/2014   NA 135 05/19/2014   K 3.6 05/19/2014   CL 99 05/19/2014   CREATININE 0.73 05/19/2014   BUN 15 05/19/2014   CO2 26 05/19/2014   TSH 3.11 08/17/2014   INR 2 09/01/2014   HGBA1C 5.8 08/17/2014    Mm Digital Screening Bilateral  09/02/2014   CLINICAL DATA:  Screening.  EXAM: DIGITAL SCREENING BILATERAL MAMMOGRAM WITH CAD  COMPARISON:  Previous exam(s).  ACR Breast Density Category b: There are scattered areas of fibroglandular density.  FINDINGS: There are no findings suspicious for malignancy. Images were processed with CAD.  IMPRESSION: No mammographic evidence of malignancy. A result letter of this screening mammogram will be mailed directly to the patient.  RECOMMENDATION: Screening mammogram in  one year. (Code:SM-B-01Y)  BI-RADS CATEGORY  1: Negative.   Electronically Signed   By: Skipper Cliche M.D.   On: 09/02/2014 14:16     Assessment & Plan:  Plan I have discontinued Ms. Wendel HYDROcodone-acetaminophen. I am also having her start on zoster vaccine live (PF). Additionally, I am having her maintain her aspirin, fish oil-omega-3 fatty acids, hydrochlorothiazide, CALCIUM-MAGNESIUM-VITAMIN D PO, warfarin, hydrocortisone, ketoconazole, ibuprofen, acetaminophen, OVER THE COUNTER MEDICATION, levothyroxine, benazepril, metoprolol, LORazepam, and amLODipine.  Meds ordered this encounter  Medications  . zoster vaccine live, PF, (ZOSTAVAX) 49702 UNT/0.65ML injection    Sig: Inject 19,400 Units into the skin once.    Dispense:  1 each    Refill:  0    Problem List Items Addressed This Visit    Hypothyroidism    Lab Results  Component Value Date   TSH 3.11 08/17/2014         Hyperlipidemia   Essential hypertension    Other Visit Diagnoses    Palpitations    -  Primary    Relevant Orders    Basic metabolic panel    CBC with Differential/Platelet    Hepatic function panel    POCT urinalysis  dipstick    EKG 12-Lead (Completed)       Follow-up: Return in about 6 months (around 03/22/2015), or if symptoms worsen or fail to improve, for hypertension, hyperlipidemia, annual exam.  Garnet Koyanagi, DO

## 2014-09-20 NOTE — Assessment & Plan Note (Signed)
Stable, con't benazapril , metoprolol, norvasc

## 2014-09-20 NOTE — Progress Notes (Signed)
Pre visit review using our clinic review tool, if applicable. No additional management support is needed unless otherwise documented below in the visit note. 

## 2014-09-21 ENCOUNTER — Telehealth: Payer: Self-pay | Admitting: Thoracic Surgery (Cardiothoracic Vascular Surgery)

## 2014-09-21 LAB — CBC WITH DIFFERENTIAL/PLATELET
Basophils Absolute: 0 10*3/uL (ref 0.0–0.1)
Basophils Relative: 0.6 % (ref 0.0–3.0)
Eosinophils Absolute: 0 10*3/uL (ref 0.0–0.7)
Eosinophils Relative: 0.6 % (ref 0.0–5.0)
HCT: 38.4 % (ref 36.0–46.0)
Hemoglobin: 13 g/dL (ref 12.0–15.0)
Lymphocytes Relative: 25.8 % (ref 12.0–46.0)
Lymphs Abs: 1.8 10*3/uL (ref 0.7–4.0)
MCHC: 33.9 g/dL (ref 30.0–36.0)
MCV: 98.5 fl (ref 78.0–100.0)
Monocytes Absolute: 0.6 10*3/uL (ref 0.1–1.0)
Monocytes Relative: 8.4 % (ref 3.0–12.0)
Neutro Abs: 4.6 10*3/uL (ref 1.4–7.7)
Neutrophils Relative %: 64.6 % (ref 43.0–77.0)
Platelets: 187 10*3/uL (ref 150.0–400.0)
RBC: 3.9 Mil/uL (ref 3.87–5.11)
RDW: 13.1 % (ref 11.5–15.5)
WBC: 7.1 10*3/uL (ref 4.0–10.5)

## 2014-09-21 LAB — POCT URINALYSIS DIPSTICK
Bilirubin, UA: NEGATIVE
Blood, UA: NEGATIVE
Glucose, UA: NEGATIVE
Ketones, UA: NEGATIVE
Leukocytes, UA: NEGATIVE
Nitrite, UA: NEGATIVE
Spec Grav, UA: >=1.03
Urobilinogen, UA: 4
pH, UA: 5

## 2014-09-21 LAB — HEPATIC FUNCTION PANEL
ALBUMIN: 4.1 g/dL (ref 3.5–5.2)
ALT: 40 U/L — ABNORMAL HIGH (ref 0–35)
AST: 43 U/L — ABNORMAL HIGH (ref 0–37)
Alkaline Phosphatase: 76 U/L (ref 39–117)
BILIRUBIN TOTAL: 1.1 mg/dL (ref 0.2–1.2)
Bilirubin, Direct: 0.1 mg/dL (ref 0.0–0.3)
Total Protein: 7.1 g/dL (ref 6.0–8.3)

## 2014-09-21 LAB — BASIC METABOLIC PANEL
BUN: 15 mg/dL (ref 6–23)
CALCIUM: 9.7 mg/dL (ref 8.4–10.5)
CHLORIDE: 102 meq/L (ref 96–112)
CO2: 29 mEq/L (ref 19–32)
CREATININE: 0.78 mg/dL (ref 0.40–1.20)
GFR: 77.49 mL/min (ref >60.00)
Glucose, Bld: 81 mg/dL (ref 70–99)
Potassium: 3.7 mEq/L (ref 3.5–5.1)
Sodium: 136 mEq/L (ref 135–145)

## 2014-09-21 NOTE — Telephone Encounter (Signed)
I called Mrs. Collier regarding her questions about spinal manipulation.  She recently has been having back pain. She saw a Chiropracter about the pain.   Some x-rays were done and the [facility] understood him to say there was a problem related to her prior aortic surgery.  Her husband dropped the films off at the office today.  I reviewed the films. They are AP and lateral views of the lumbar spine. There is aortic calcification visible on the lateral film.  Reviewing her past CT scans, there has been calcification of the abdominal aorta going back years. This is predates her dissection.  She did not have an aneurysm previously, but I cannot rule that possibility in or out based on L spine films  I asked her to have the chiropracter contact me to tell me exactly what question it is I am supposed to answer  Remo Lipps C. Roxan Hockey, MD [facility] [phone]

## 2014-09-22 ENCOUNTER — Encounter: Payer: Self-pay | Admitting: Cardiovascular Disease

## 2014-09-22 ENCOUNTER — Ambulatory Visit (INDEPENDENT_AMBULATORY_CARE_PROVIDER_SITE_OTHER): Payer: Medicare Other | Admitting: Cardiovascular Disease

## 2014-09-22 VITALS — BP 140/76 | HR 64 | Resp 16

## 2014-09-22 DIAGNOSIS — Z7901 Long term (current) use of anticoagulants: Secondary | ICD-10-CM | POA: Diagnosis not present

## 2014-09-22 DIAGNOSIS — I10 Essential (primary) hypertension: Secondary | ICD-10-CM

## 2014-09-22 DIAGNOSIS — R002 Palpitations: Secondary | ICD-10-CM | POA: Diagnosis not present

## 2014-09-22 DIAGNOSIS — I480 Paroxysmal atrial fibrillation: Secondary | ICD-10-CM | POA: Diagnosis not present

## 2014-09-22 DIAGNOSIS — Z8679 Personal history of other diseases of the circulatory system: Secondary | ICD-10-CM

## 2014-09-22 DIAGNOSIS — E785 Hyperlipidemia, unspecified: Secondary | ICD-10-CM

## 2014-09-22 MED ORDER — HYDROCHLOROTHIAZIDE 12.5 MG PO CAPS: 25.0000 mg | ORAL_CAPSULE | Freq: Every day | ORAL | Status: DC

## 2014-09-22 MED ORDER — AMLODIPINE BESYLATE 5 MG PO TABS: 2.5000 mg | ORAL_TABLET | Freq: Every day | ORAL | Status: DC

## 2014-09-22 NOTE — Progress Notes (Signed)
Patient ID: Breanna Hernandez, female   DOB: [birth year], 69 y.o.   MRN: [id number]     Cardiology Office Note   Date:  09/23/2014   ID:  Breanna Hernandez, DOB [birth year], MRN [id number]  PCP:  Garnet Koyanagi, DO  Cardiologist:   Sanda Klein, MD   Chief Complaint  Patient presents with  . Annual Exam    Patient has had swelling in her hands, feet, legs, and ankles. She has had afib and had a pinched nerve 2 weeks ago.      History of Present Illness: Breanna Hernandez is a 69 y.o. 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 She also has systemic hypertension and treated hypothyroidism. She has been on warfarin anticoagulation since surgery and has not had bleeding complications or embolic events. An event monitor for 30 days performed within the last year did not show any evidence of atrial fibrillation. She did not have atrial fibrillation prior to her heart surgery. She feels occasional "flip-flopping in her chest" but this appears to be very brief, never sustained, sounds more consistent with premature beats rather than atrial fibrillation. She is interested in discontinuing anticoagulation. Her electrocardiogram today shows sinus bradycardia.  He has had a lot of pain from a "pinched nerve" in her back and has symptoms strongly suggestive of right side sciatica. She is scheduled to see an Publishing copy. Dr. Roxan Hockey addressed issues related to calcification of the abdominal aorta noticed during lumbar spine x-rays. This is not a new finding. He has a history of severe hypercholesterolemia with LDL greater than 190, but has refused to take statins. Prior to her aortic dissection surgery she had a normal nuclear stress test in 2011 and again in May 2015. The bypass to the right coronary artery was placed since a large plaque  was macroscopically visible in the right coronary artery at the time of her dissection repair. She has never had symptoms of coronary disease.  She has complaints of ankle swelling, worse at the end of the day, not completely resolved by the next morning.   Past Medical History  Diagnosis Date  . HTN (hypertension)   . Hypothyroidism   . 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 Diverticulosis   . Hyperplastic colon polyp   . Hyperlipidemia   . Obesity   . IBS (irritable bowel syndrome)   . Atrial fibrillation     h/o post-op 04/2010  . Renal cyst, left   . Fatty liver 01/10/07  . Breast cancer     x 3 cancer - radiation right x2 ''00,'05, left '07 mammosite radiation   . Lower extremity neuropathy     occ. affects walking and balance"foot to knees"  . 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    Past Surgical History  Procedure Laterality Date  . Cholecystectomy    . Tonsillectomy    . Colonoscopy w/ polypectomy    . Breast lumpectomy  2000    Right x2, Left x1  . Median sternotomy, extracorporeal circulation, repair of type 1 aortic dissection with  tube graft from  sinotubular junction to take off of innominate artery, distal anastomoses under deep bypothermic circulatory arrest  05/03/2010    Dr Roxan Hockey  . Cabg x 1  05/03/2010    RCA -Dr Roxan Hockey (with  type 1 aortic dissection)  . Hysteroscopy w/d&c N/A 04/13/2014    Procedure: DILATATION AND CURETTAGE /HYSTEROSCOPY Polypectomy;  Surgeon: Maeola Sarah. Landry Mellow, MD;  Location: [facility];  Service: Gynecology;  Laterality: N/A;  Possible Polypectomy  . Robotic assisted total hysterectomy with bilateral salpingo oopherectomy Bilateral 05/18/2014    Procedure: ROBOTIC ASSISTED TOTAL HYSTERECTOMY WITH BILATERAL SALPINGO OOPHORECTOMY SENTINAL LYMPH NODE MAPPING ;  Surgeon: Everitt Amber, MD;  Location: [facility];  Service: Gynecology;  Laterality: Bilateral;     Current Outpatient Prescriptions    Medication Sig Dispense Refill  . acetaminophen (TYLENOL) 500 MG tablet Take 1,000 mg by mouth every 6 (six) hours as needed for moderate pain or headache.    Marland Kitchen amLODipine (NORVASC) 5 MG tablet Take 0.5 tablets (2.5 mg total) by mouth daily. 30 tablet 6  . aspirin 81 MG tablet Take 81 mg by mouth every morning.     . benazepril (LOTENSIN) 40 MG tablet Take 1 tablet (40 mg total) by mouth daily. NEEDS APPOINTMENT FOR FUTURE REFILLS 30 tablet 0  . CALCIUM-MAGNESIUM-VITAMIN D PO Take 1 tablet by mouth 2 (two) times daily.     . fish oil-omega-3 fatty acids 1000 MG capsule Take 1 g by mouth 2 (two) times daily.     . hydrochlorothiazide (MICROZIDE) 12.5 MG capsule Take 2 capsules (25 mg total) by mouth daily. 180 capsule 3  . hydrocortisone 2.5 % cream Apply 1 application topically daily as needed (groin irritation).   0  . ibuprofen (ADVIL,MOTRIN) 200 MG tablet Take 400 mg by mouth every 6 (six) hours as needed for headache or moderate pain.    Marland Kitchen ketoconazole (NIZORAL) 2 % cream Apply 1 application topically daily as needed for irritation (groin).   2  . levothyroxine (SYNTHROID, LEVOTHROID) 75 MCG tablet Take 1 tablet (75 mcg total) by mouth daily before breakfast. 90 tablet 3  . LORazepam (ATIVAN) 1 MG tablet 1 by mouth " as needed" and should not be taken on a regular basis. Regular use can increase risk of addiction but more importantly affect level of alertness and balance with increased risk of falling. 30 tablet 0  . metoprolol (LOPRESSOR) 50 MG tablet Take 1 tablet by mouth each morning and 1.5 tablets each evening 225 tablet 3  . OVER THE COUNTER MEDICATION Take 1 tablet by mouth every morning. Product Name: EHT Mind enhancement formula Vitamin d 200 units Vitamin b 1.6 mg Vitamin b-12 10 mcg Selinum 70 mcg Coffee Extract 35 mg Alpha leporic acid 50 mg huperzine-a 50 mcg    . warfarin (COUMADIN) 4 MG tablet Take 4-6 mg by mouth daily. 4 mg every day except Tuesdays, Thursdays and  Saturdays and takes 6 mg on those days    . zoster vaccine live, PF, (ZOSTAVAX) 03009 UNT/0.65ML injection Inject 19,400 Units into the skin once. 1 each 0  . [DISCONTINUED] amLODipine-benazepril (LOTREL) 5-20 MG per capsule Take 1 capsule by mouth daily.      . [DISCONTINUED] escitalopram (LEXAPRO) 10 MG tablet Take 10 mg by mouth daily.      . [DISCONTINUED] lisinopril (PRINIVIL,ZESTRIL) 20 MG tablet Take 20 mg by mouth daily.       No current facility-administered medications for this visit.    Allergies:   Review of patient's allergies indicates no known allergies.    Social History:  The patient  reports that she has never smoked. She has never used smokeless tobacco. She reports that she does not drink alcohol or use illicit  drugs.   Family History:  The patient's family history includes Alzheimer's disease in her mother; Aneurysm in her father; Goiter in her mother; Heart attack in her father; Hypertension in her brother and mother; Kidney disease in her brother. There is no history of Colon cancer.    ROS:  Please see the history of present illness.    Otherwise, review of systems positive for none.   All other systems are reviewed and negative.    PHYSICAL EXAM: VS:  BP 140/76 mmHg  Pulse 64  Resp 16 , BMI There is no weight on file to calculate BMI.  General: Alert, oriented x3, no distress Head: no evidence of trauma, PERRL, EOMI, no exophtalmos or lid lag, no myxedema, no xanthelasma; normal ears, nose and oropharynx Neck: normal jugular venous pulsations and no hepatojugular reflux; brisk carotid pulses without delay and no carotid bruits Chest: clear to auscultation, no signs of consolidation by percussion or palpation, normal fremitus, symmetrical and full respiratory excursions Cardiovascular: normal position and quality of the apical impulse, regular rhythm, normal first and second heart sounds, no murmurs, rubs or gallops Abdomen: no tenderness or distention, no  masses by palpation, no abnormal pulsatility or arterial bruits, normal bowel sounds, no hepatosplenomegaly Extremities: no clubbing, cyanosis or edema; 2+ radial, ulnar and brachial pulses bilaterally; 2+ right femoral, posterior tibial and dorsalis pedis pulses; 2+ left femoral, posterior tibial and dorsalis pedis pulses; no subclavian or femoral bruits Neurological: grossly nonfocal Psych: euthymic mood, full affect   EKG:  EKG is ordered today. The ekg ordered today demonstrates sinus bradycardia, nonspecific ST segment changes   Recent Labs: 08/17/2014: TSH 3.11 09/20/2014: ALT 40*; BUN 15; Creatinine, Ser 0.78; Hemoglobin 13.0; Platelets 187.0; Potassium 3.7; Sodium 136    Lipid Panel    Component Value Date/Time   CHOL 263* 08/17/2014 1134   CHOL 273* 09/22/2013 0957   TRIG 325* 08/17/2014 1134   TRIG 310.0* 09/22/2013 0957   TRIG 293* 03/29/2006 1033   HDL 39* 08/17/2014 1134   HDL 32.10* 09/22/2013 0957   CHOLHDL 9 09/22/2013 0957   CHOLHDL 7.4 CALC 03/29/2006 1033   VLDL 62.0* 09/22/2013 0957   LDLCALC 159* 08/17/2014 1134   LDLCALC 179* 09/22/2013 0957   LDLDIRECT 148.3 03/29/2006 1033      Wt Readings from Last 3 Encounters:  09/20/14 245 lb 3.2 oz (111.222 kg)  08/17/14 242 lb (109.77 kg)  05/31/14 249 lb 11.2 oz (113.263 kg)      ASSESSMENT AND PLAN:  1. Postoperative atrial fibrillation - no detected recurrence in 4 years. Will wear a 24-hour monitor since she has some palpitations. A 30 day event monitor last year was normal. If we still don't detect any evidence of atrial fibrillation I think it is reasonable to stop warfarin at this time.  2. Coronary artery disease/plaque in right coronary artery - she has a recent normal nuclear stress test and has no symptoms to suggest angina pectoris at this time. She has severe hyperlipidemia but is resistant to receiving a statin prescription. Her most recent LDL cholesterol was 159. There is also evidence of aortic  atherosclerosis by imaging studies.  3. Systemic hypertension well controlled. We'll try to help her complaints of lower extremity edema by slightly increasing the hydrochlorothiazide and decreasing the amlodipine.  4. History of aortic dissection repair and single-vessel bypass to the right coronary artery 2012 -  last CT angiogram performed in 2015 showed no evidence of aortic aneurysm confirm the presence of  atherosclerotic calcifications in the coronary arteries, abdominal aorta and visceral branches   Current medicines are reviewed at length with the patient today.  The patient does not have concerns regarding medicines.  The following changes have been made:  Increase  Hydrochlorothiazide to 25mg  daily. Decrease Amlodipine to 2.5mg  daily.   Labs/ tests ordered today include:  Orders Placed This Encounter  Procedures  . Holter monitor - 24 hour    Patient Instructions  Your physician has recommended that you wear a 24 hour holter monitor. Holter monitors are medical devices that record the heart's electrical activity. Doctors most often use these monitors to diagnose arrhythmias. Arrhythmias are problems with the speed or rhythm of the heartbeat. The monitor is a small, portable device. You can wear one while you do your normal daily activities. This is usually used to diagnose what is causing palpitations/syncope (passing out).  Medication Instructions:   Increase  Hydrochlorothiazide to 25mg  daily. Decrease Amlodipine to 2.5mg  daily.  Labwork:  none  Testing/Procedures:  24 hour holter monitor  Follow-Up:  One Year  One year.  Any Other Special Instructions Will Be Listed Below (If Applicable).         Mikael Spray, MD  09/23/2014 10:41 AM    Sanda Klein, MD, [facility] [phone] office [phone] pager

## 2014-09-22 NOTE — Patient Instructions (Signed)
Your physician has recommended that you wear a 24 hour holter monitor. Holter monitors are medical devices that record the heart's electrical activity. Doctors most often use these monitors to diagnose arrhythmias. Arrhythmias are problems with the speed or rhythm of the heartbeat. The monitor is a small, portable device. You can wear one while you do your normal daily activities. This is usually used to diagnose what is causing palpitations/syncope (passing out).  Medication Instructions:   Increase  Hydrochlorothiazide to 25mg  daily. Decrease Amlodipine to 2.5mg  daily.  Labwork:  none  Testing/Procedures:  24 hour holter monitor  Follow-Up:  One Year  One year.  Any Other Special Instructions Will Be Listed Below (If Applicable).

## 2014-09-23 ENCOUNTER — Encounter: Payer: Self-pay | Admitting: Cardiovascular Disease

## 2014-09-23 ENCOUNTER — Telehealth: Payer: Self-pay | Admitting: Cardiovascular Disease

## 2014-09-23 ENCOUNTER — Encounter: Payer: Self-pay | Admitting: *Deleted

## 2014-09-23 NOTE — Telephone Encounter (Signed)
Returned call to patient she stated she saw Dr.Croitoru yesterday and hctz 12.5 mg increased to 2 tablets daily.Stated she took a hctz 12.5 mg when she got home.Stated she was up urinating last night.Advised she can take hctz 12.5 mg 2 tablets every morning.Advised ok to take amlodipine 5 mg 1/2 tablet every morning.Advised to call back if needed.

## 2014-09-23 NOTE — Telephone Encounter (Signed)
Pt called in wanting to speak with Dr.C's nurse about her HCTZ and Amlodipine medicaiton. She says that these had her going to the bathroom all night so she wanted to discuss administration of her meds. Please call back  Thanks

## 2014-09-28 ENCOUNTER — Other Ambulatory Visit: Payer: Self-pay | Admitting: Orthopaedic Surgery

## 2014-09-28 ENCOUNTER — Ambulatory Visit (INDEPENDENT_AMBULATORY_CARE_PROVIDER_SITE_OTHER): Payer: Medicare Other

## 2014-09-28 DIAGNOSIS — M545 Low back pain: Secondary | ICD-10-CM

## 2014-09-28 DIAGNOSIS — R002 Palpitations: Secondary | ICD-10-CM

## 2014-09-29 ENCOUNTER — Telehealth: Payer: Self-pay | Admitting: Cardiovascular Disease

## 2014-09-29 NOTE — Telephone Encounter (Signed)
Pt is returning Barbara's call in regards to the results from her 24 hour monitor. Please call  Thanks

## 2014-09-30 NOTE — Telephone Encounter (Signed)
Pt still waiting to get her results.

## 2014-09-30 NOTE — Telephone Encounter (Signed)
Patient notified of holter monitor results and advised to stop warfarin and take aspirin 81mg  once daily per Dr. Sallyanne Kuster

## 2014-10-01 ENCOUNTER — Ambulatory Visit
Admission: RE | Admit: 2014-10-01 | Discharge: 2014-10-01 | Disposition: A | Discharge status: Home / Self Care | Payer: Medicare Other | Source: Ambulatory Visit | Attending: Orthopaedic Surgery | Admitting: Orthopaedic Surgery

## 2014-10-01 DIAGNOSIS — M545 Low back pain: Secondary | ICD-10-CM

## 2014-10-04 ENCOUNTER — Other Ambulatory Visit: Payer: Self-pay

## 2014-10-05 ENCOUNTER — Other Ambulatory Visit: Payer: Self-pay | Admitting: Hematology and Oncology

## 2014-10-05 ENCOUNTER — Other Ambulatory Visit (HOSPITAL_BASED_OUTPATIENT_CLINIC_OR_DEPARTMENT_OTHER): Payer: Medicare Other

## 2014-10-05 ENCOUNTER — Encounter: Payer: Self-pay | Admitting: Hematology and Oncology

## 2014-10-05 ENCOUNTER — Telehealth: Payer: Self-pay | Admitting: Hematology and Oncology

## 2014-10-05 ENCOUNTER — Ambulatory Visit (HOSPITAL_BASED_OUTPATIENT_CLINIC_OR_DEPARTMENT_OTHER): Payer: Medicare Other | Admitting: Hematology and Oncology

## 2014-10-05 VITALS — BP 142/65 | HR 58 | Temp 97.9°F | Resp 18 | Ht 64.0 in | Wt 243.9 lb

## 2014-10-05 DIAGNOSIS — E785 Hyperlipidemia, unspecified: Secondary | ICD-10-CM | POA: Diagnosis not present

## 2014-10-05 DIAGNOSIS — Z853 Personal history of malignant neoplasm of breast: Secondary | ICD-10-CM | POA: Diagnosis not present

## 2014-10-05 DIAGNOSIS — C541 Malignant neoplasm of endometrium: Secondary | ICD-10-CM

## 2014-10-05 DIAGNOSIS — R748 Abnormal levels of other serum enzymes: Secondary | ICD-10-CM | POA: Diagnosis not present

## 2014-10-05 LAB — COMPREHENSIVE METABOLIC PANEL (CC13)
ALBUMIN: 3.8 g/dL (ref 3.5–5.0)
ALT: 52 U/L (ref 0–55)
AST: 64 U/L — ABNORMAL HIGH (ref 5–34)
Alkaline Phosphatase: 85 U/L (ref 40–150)
Anion Gap: 7 mEq/L (ref 3–11)
BUN: 18 mg/dL (ref 7.0–26.0)
CALCIUM: 9.5 mg/dL (ref 8.4–10.4)
CHLORIDE: 102 meq/L (ref 98–109)
CO2: 30 mEq/L — ABNORMAL HIGH (ref 22–29)
Creatinine: 0.9 mg/dL (ref 0.6–1.1)
EGFR: 63 mL/min/{1.73_m2} — ABNORMAL LOW (ref >90)
GLUCOSE: 89 mg/dL (ref 70–140)
Potassium: 3.9 mEq/L (ref 3.5–5.1)
SODIUM: 139 meq/L (ref 136–145)
Total Bilirubin: 1.86 mg/dL — ABNORMAL HIGH (ref 0.20–1.20)
Total Protein: 7.2 g/dL (ref 6.4–8.3)

## 2014-10-05 LAB — CBC WITH DIFFERENTIAL/PLATELET
BASO%: 1.7 % (ref 0.0–2.0)
Basophils Absolute: 0.1 10*3/uL (ref 0.0–0.1)
EOS%: 0.6 % (ref 0.0–7.0)
Eosinophils Absolute: 0 10*3/uL (ref 0.0–0.5)
HEMATOCRIT: 38.3 % (ref 34.8–46.6)
HGB: 13.2 g/dL (ref 11.6–15.9)
LYMPH#: 1.7 10*3/uL (ref 0.9–3.3)
LYMPH%: 24.2 % (ref 14.0–49.7)
MCH: 33.5 pg (ref 25.1–34.0)
MCHC: 34.4 g/dL (ref 31.5–36.0)
MCV: 97.3 fL (ref 79.5–101.0)
MONO#: 0.8 10*3/uL (ref 0.1–0.9)
MONO%: 10.6 % (ref 0.0–14.0)
NEUT#: 4.6 10*3/uL (ref 1.5–6.5)
NEUT%: 62.9 % (ref 38.4–76.8)
Platelets: 191 10*3/uL (ref 145–400)
RBC: 3.94 10*6/uL (ref 3.70–5.45)
RDW: 12.8 % (ref 11.2–14.5)
WBC: 7.2 10*3/uL (ref 3.9–10.3)

## 2014-10-05 MED ORDER — ATORVASTATIN CALCIUM 10 MG PO TABS: 10.0000 mg | ORAL_TABLET | Freq: Every day | ORAL | Status: DC

## 2014-10-05 NOTE — Telephone Encounter (Signed)
per pof to sch pt appt-gave pt copy of avs °

## 2014-10-06 DIAGNOSIS — R748 Abnormal levels of other serum enzymes: Secondary | ICD-10-CM | POA: Insufficient documentation

## 2014-10-06 NOTE — Progress Notes (Signed)
[facility] OFF[facility] PROGRESS NOTE  Patient Care Team: Breanna Chessman, DO as PCP - General (Family Medicine) Heath Lark, MD as Consulting Physician (Hematology and Oncology)  SUMMARY OF ONCOLOGIC HISTORY: I reviewed the patient's records extensive and collaborated the history with the patient. Summary of her history is as follows: This patient had history of metachronous primary bilateral breast cancers. 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 Started on hormonal therapy with Aromasin which she stopped due to excessive weight gain. Of note, she took it for less than a year.  Most recent mammogram done 04/18/2012 showed a 4 mm nodular density upper inner quadrant left breast which was not seen on ultrasound. 6 month interval study recommended by the radiologist including ultrasound were normal.  The cancer was detected by screening mammogram. The cancer was not palpable prior to diagnosis In terms of breast cancer risk profile:  She menarched at early age of 26 and went to menopause at age 25  She had 6 pregnancy, her first child was born at age 75  She breast-fed all her children.  She never received birth control pills.  She was never exposed to fertility medications or hormone replacement therapy.  She has no family history of Breast/GYN/GI cancer.  Genetic screening in March 2016 was negative for hereditary breast/ovarian cancer In February 2016, she underwent hysterectomy for stage I endometrial cancer INTERVAL HISTORY: Please see below for problem oriented charting. She feels well. She have a palpable lump on the left side of her breast which is currently under observation. No other new changes.    REVIEW OF SYSTEMS:   Constitutional: Denies fevers, chills or abnormal weight loss Eyes: Denies blurriness of vision Ears, nose, mouth, throat, and face: Denies mucositis or sore throat Respiratory: Denies cough, dyspnea or wheezes Cardiovascular: Denies palpitation, chest discomfort or lower extremity swelling Gastrointestinal:  Denies nausea, heartburn or change in bowel habits Skin: Denies abnormal skin rashes Lymphatics: Denies new lymphadenopathy or easy bruising Neurological:Denies numbness, tingling or new weaknesses Behavioral/Psych: Mood is stable, no new changes  All other systems were reviewed with the patient and are negative.  I have reviewed the past medical history, past surgical history, social history and family history with the patient and they are unchanged from previous note.  ALLERGIES:  has No Known Allergies.  MEDICATIONS:  Current Outpatient Prescriptions  Medication Sig Dispense Refill  . acetaminophen (TYLENOL) 500 MG tablet Take 1,000 mg by mouth every 6 (six) hours as needed for moderate pain or headache.    Marland Kitchen amLODipine (NORVASC) 5 MG tablet Take 0.5 tablets (2.5 mg total) by mouth daily. 30 tablet 6  . aspirin 81 MG tablet Take 81 mg by mouth every morning.     . benazepril (LOTENSIN) 40 MG tablet Take 1 tablet (40 mg total) by mouth daily. NEEDS APPOINTMENT FOR FUTURE REFILLS 30 tablet 0  . CALCIUM-MAGNESIUM-VITAMIN D PO Take 1 tablet by mouth 2 (two) times daily.     . fish oil-omega-3 fatty acids 1000 MG capsule Take 1 g by mouth 2 (two) times daily.     . hydrochlorothiazide (MICROZIDE) 12.5 MG capsule Take 2 capsules (25 mg total) by mouth daily. 180 capsule 3  . hydrocortisone 2.5 % cream Apply 1 application topically  daily as needed (groin irritation).   0  . ibuprofen (ADVIL,MOTRIN) 200 MG tablet Take 400 mg by mouth every 6 (six) hours as needed for headache or moderate pain.    Marland Kitchen ketoconazole (NIZORAL) 2 % cream Apply 1 application topically  daily as needed for irritation (groin).   2  . levothyroxine (SYNTHROID, LEVOTHROID) 75 MCG tablet Take 1 tablet (75 mcg total) by mouth daily before breakfast. 90 tablet 3  . LORazepam (ATIVAN) 1 MG tablet 1 by mouth " as needed" and should not be taken on a regular basis. Regular use can increase risk of addiction but more importantly affect level of alertness and balance with increased risk of falling. 30 tablet 0  . metoprolol (LOPRESSOR) 50 MG tablet Take 1 tablet by mouth each morning and 1.5 tablets each evening 225 tablet 3  . OVER THE COUNTER MEDICATION Take 1 tablet by mouth every morning. Product Name: EHT Mind enhancement formula Vitamin d 200 units Vitamin b 1.6 mg Vitamin b-12 10 mcg Selinum 70 mcg Coffee Extract 35 mg Alpha leporic acid 50 mg huperzine-a 50 mcg    . zoster vaccine live, PF, (ZOSTAVAX) 00762 UNT/0.65ML injection Inject 19,400 Units into the skin once. 1 each 0  . atorvastatin (LIPITOR) 10 MG tablet Take 1 tablet (10 mg total) by mouth daily. 90 tablet 3  . [DISCONTINUED] amLODipine-benazepril (LOTREL) 5-20 MG per capsule Take 1 capsule by mouth daily.      . [DISCONTINUED] escitalopram (LEXAPRO) 10 MG tablet Take 10 mg by mouth daily.      . [DISCONTINUED] lisinopril (PRINIVIL,ZESTRIL) 20 MG tablet Take 20 mg by mouth daily.       No current facility-administered medications for this visit.    PHYSICAL EXAMINATION: ECOG PERFORMANCE STATUS: 0 - Asymptomatic  Filed Vitals:   10/05/14 1339  BP: 142/65  Pulse: 58  Temp: 97.9 F (36.6 C)  Resp: 18   Filed Weights   10/05/14 1339  Weight: 243 lb 14.4 oz (110.632 kg)    GENERAL:alert, no distress and comfortable. She is morbidly obese SKIN: skin color, texture, turgor are normal, no rashes or significant lesions EYES: normal, Conjunctiva are pink and non-injected, sclera clear OROPHARYNX:no exudate, no erythema and lips, buccal mucosa, and tongue normal  NECK: supple, thyroid normal size, non-tender,  without nodularity LYMPH:  no palpable lymphadenopathy in the cervical, axillary or inguinal LUNGS: clear to auscultation and percussion with normal breathing effort HEART: regular rate & rhythm and no murmurs and no lower extremity edema ABDOMEN:abdomen soft, non-tender and normal bowel sounds Musculoskeletal:no cyanosis of digits and no clubbing  NEURO: alert & oriented x 3 with fluent speech, no focal motor/sensory deficits Normal breast exam on the right. On the left, there is a palpable subcutaneous nodule resembling fibro-adenoma/sebaceous cyst which appeared benign LABORATORY DATA:  I have reviewed the data as listed    Component Value Date/Time   NA 139 10/05/2014 1309   NA 136 09/20/2014 1555   K 3.9 10/05/2014 1309   K 3.7 09/20/2014 1555   CL 102 09/20/2014 1555   CO2 30* 10/05/2014 1309   CO2 29 09/20/2014 1555   GLUCOSE 89 10/05/2014 1309   GLUCOSE 81 09/20/2014 1555   BUN 18.0 10/05/2014 1309   BUN 15 09/20/2014 1555   CREATININE 0.9 10/05/2014 1309   CREATININE 0.78 09/20/2014 1555   CREATININE 0.77 06/22/2013 1149   CALCIUM 9.5 10/05/2014 1309   CALCIUM 9.7 09/20/2014 1555   PROT 7.2 10/05/2014 1309  PROT 7.1 09/20/2014 1555   ALBUMIN 3.8 10/05/2014 1309   ALBUMIN 4.1 09/20/2014 1555   AST 64* 10/05/2014 1309   AST 43* 09/20/2014 1555   ALT 52 10/05/2014 1309   ALT 40* 09/20/2014 1555   ALKPHOS 85 10/05/2014 1309   ALKPHOS 76 09/20/2014 1555   BILITOT 1.86* 10/05/2014 1309   BILITOT 1.1 09/20/2014 1555   GFRNONAA 85* 05/19/2014 0442   GFRAA >90 05/19/2014 0442    No results found for: SPEP, UPEP  Lab Results  Component Value Date   WBC 7.2 10/05/2014   NEUTROABS 4.6 10/05/2014   HGB 13.2 10/05/2014   HCT 38.3 10/05/2014   MCV 97.3 10/05/2014   PLT 191 10/05/2014      Chemistry      Component Value Date/Time   NA 139 10/05/2014 1309   NA 136 09/20/2014 1555   K 3.9 10/05/2014 1309   K 3.7 09/20/2014 1555   CL 102 09/20/2014 1555   CO2  30* 10/05/2014 1309   CO2 29 09/20/2014 1555   BUN 18.0 10/05/2014 1309   BUN 15 09/20/2014 1555   CREATININE 0.9 10/05/2014 1309   CREATININE 0.78 09/20/2014 1555   CREATININE 0.77 06/22/2013 1149      Component Value Date/Time   CALCIUM 9.5 10/05/2014 1309   CALCIUM 9.7 09/20/2014 1555   ALKPHOS 85 10/05/2014 1309   ALKPHOS 76 09/20/2014 1555   AST 64* 10/05/2014 1309   AST 43* 09/20/2014 1555   ALT 52 10/05/2014 1309   ALT 40* 09/20/2014 1555   BILITOT 1.86* 10/05/2014 1309   BILITOT 1.1 09/20/2014 1555     I review her records pertaining to the diagnosis of endometrial cancer ASSESSMENT & PLAN:  BREAST CANCER, HX OF She had left breast cancer treated with local therapy, intolerance to adjuvant Aromasin. I recommend continue history, physical examination and yearly mammogram.    Endometrial cancer She had early stage I endometrial cancer. No adjuvant treatment is needed. I will defer to a gynecologist for surveillance gynecology exam. She had genetic testing done which was negative  Elevated liver enzymes She has elevated liver enzymes, fatty liver disease and morbid obesity. I recommend consideration for low-dose Lipitor. If she is interested to start Lipitor, I recommend recheck liver function tests within the months     Orders Placed This Encounter  Procedures  . CBC with Differential/Platelet    Standing Status: Future     Number of Occurrences:      Standing Expiration Date: 11/09/2015  . Comprehensive metabolic panel    Standing Status: Future     Number of Occurrences:      Standing Expiration Date: 11/09/2015   All questions were answered. The patient knows to call the clinic with any problems, questions or concerns. No barriers to learning was detected. I spent 25 minutes counseling the patient face to face. The total time spent in the appointment was 30 minutes and more than 50% was on counseling and review of test results     [facility] [place], [place],  MD 10/06/2014 4:38 PM

## 2014-10-06 NOTE — Assessment & Plan Note (Signed)
She has elevated liver enzymes, fatty liver disease and morbid obesity. I recommend consideration for low-dose Lipitor. If she is interested to start Lipitor, I recommend recheck liver function tests within the months

## 2014-10-06 NOTE — Assessment & Plan Note (Signed)
She had left breast cancer treated with local therapy, intolerance to adjuvant Aromasin. I recommend continue history, physical examination and yearly mammogram.

## 2014-10-06 NOTE — Assessment & Plan Note (Signed)
She had early stage I endometrial cancer. No adjuvant treatment is needed. I will defer to a gynecologist for surveillance gynecology exam. She had genetic testing done which was negative

## 2014-10-13 ENCOUNTER — Other Ambulatory Visit: Payer: Self-pay | Admitting: Orthopaedic Surgery

## 2014-10-13 ENCOUNTER — Ambulatory Visit: Payer: BLUE CROSS/BLUE SHIELD | Admitting: Pharmacist Clinician (PhC)/ Clinical Pharmacy Specialist

## 2014-10-13 DIAGNOSIS — M545 Low back pain: Secondary | ICD-10-CM

## 2014-11-02 ENCOUNTER — Ambulatory Visit: Payer: Self-pay | Admitting: Pharmacist Clinician (PhC)/ Clinical Pharmacy Specialist

## 2014-11-02 DIAGNOSIS — Z7901 Long term (current) use of anticoagulants: Secondary | ICD-10-CM

## 2014-11-02 DIAGNOSIS — I480 Paroxysmal atrial fibrillation: Secondary | ICD-10-CM

## 2014-11-05 ENCOUNTER — Other Ambulatory Visit: Payer: Self-pay | Admitting: Cardiovascular Disease

## 2014-11-05 NOTE — Telephone Encounter (Signed)
REFILL 

## 2014-11-06 ENCOUNTER — Other Ambulatory Visit: Payer: Self-pay | Admitting: Cardiovascular Disease

## 2014-11-08 NOTE — Telephone Encounter (Signed)
REFILL 

## 2014-11-29 ENCOUNTER — Ambulatory Visit: Payer: Medicare Other | Admitting: Family Medicine

## 2015-03-11 ENCOUNTER — Telehealth: Payer: Self-pay | Admitting: Internal Medicine

## 2015-03-11 NOTE — Telephone Encounter (Signed)
Left message for patient to call back  

## 2015-03-11 NOTE — Telephone Encounter (Signed)
She called on call tonight.  Her symptoms seem pretty mild to me.  No fevers or chills and abd is not tender to touch.  She's going out of town in AM, will call back to the office to report on how she is feeling when she gets back.

## 2015-03-11 NOTE — Telephone Encounter (Signed)
Patient reports that she had an episode of "extreme cramping and pain" that lasted approximately 45 minutes yesterday.  She reports that she had several episodes of soft stools following.  She has had 2 episodes of passing bloody mucous this am once around 2 am the other about 1 1/2 hours ago. She is no longer having pain or cramping, but does state her lower abdomen is "tender".  She denies nausea, vomiting, or fever this am.  She is tolerating a diet with no problems.  She is advised that she should stay on a liquid to soft diet for now.  Patient has a history of ischemic colitis in 2012 and 2014.  Dr. Hilarie Fredrickson you are MD of the day please review and advise if needs any additional orders.

## 2015-03-11 NOTE — Telephone Encounter (Signed)
Given her history and recent symptoms, likely recurrent ischemic injury to the colon. She should be advised to go to the ER if symptoms worsen including worsening abdominal pain, fever or chills, further bloody stools. Agree with liquid diet advancing to low residue as tolerated Last colonoscopy 2011 by Dr. Olevia Perches Assuming improvement as an outpatient, nonurgent follow-up recommended with primary care and GI as appropriate

## 2015-04-06 ENCOUNTER — Telehealth: Payer: Self-pay

## 2015-04-06 NOTE — Telephone Encounter (Signed)
Left msg for patient to call back to scheduled Annual wellness visit

## 2015-04-15 ENCOUNTER — Other Ambulatory Visit: Payer: Self-pay | Admitting: Obstetrics and Gynecology

## 2015-04-15 DIAGNOSIS — N6320 Unspecified lump in the left breast, unspecified quadrant: Secondary | ICD-10-CM

## 2015-04-20 ENCOUNTER — Ambulatory Visit
Admission: RE | Admit: 2015-04-20 | Discharge: 2015-04-20 | Disposition: A | Discharge status: Home / Self Care | Payer: Medicare Other | Source: Ambulatory Visit | Attending: Obstetrics and Gynecology | Admitting: Obstetrics and Gynecology

## 2015-04-20 DIAGNOSIS — N6320 Unspecified lump in the left breast, unspecified quadrant: Secondary | ICD-10-CM

## 2015-05-09 ENCOUNTER — Other Ambulatory Visit: Payer: Self-pay | Admitting: Obstetrics and Gynecology

## 2015-05-09 DIAGNOSIS — N6320 Unspecified lump in the left breast, unspecified quadrant: Secondary | ICD-10-CM

## 2015-06-01 ENCOUNTER — Other Ambulatory Visit: Payer: Self-pay | Admitting: *Deleted

## 2015-06-01 DIAGNOSIS — Z8679 Personal history of other diseases of the circulatory system: Secondary | ICD-10-CM

## 2015-06-01 DIAGNOSIS — Z9889 Other specified postprocedural states: Principal | ICD-10-CM

## 2015-06-03 ENCOUNTER — Ambulatory Visit
Admission: RE | Admit: 2015-06-03 | Discharge: 2015-06-03 | Disposition: A | Discharge status: Home / Self Care | Payer: Medicare Other | Source: Ambulatory Visit | Attending: Obstetrics and Gynecology | Admitting: Obstetrics and Gynecology

## 2015-06-03 DIAGNOSIS — N6320 Unspecified lump in the left breast, unspecified quadrant: Secondary | ICD-10-CM

## 2015-07-05 ENCOUNTER — Encounter: Payer: Medicare Other | Admitting: Thoracic Surgery (Cardiothoracic Vascular Surgery)

## 2015-07-05 ENCOUNTER — Inpatient Hospital Stay: Admission: RE | Admit: 2015-07-05 | Payer: Medicare Other | Source: Ambulatory Visit

## 2015-08-15 ENCOUNTER — Other Ambulatory Visit: Payer: Self-pay

## 2015-08-15 DIAGNOSIS — Z1231 Encounter for screening mammogram for malignant neoplasm of breast: Secondary | ICD-10-CM

## 2015-08-25 ENCOUNTER — Other Ambulatory Visit: Payer: Self-pay | Admitting: Cardiovascular Disease

## 2015-08-25 NOTE — Telephone Encounter (Signed)
Rx request sent to pharmacy.  

## 2015-09-06 ENCOUNTER — Ambulatory Visit: Payer: Medicare Other

## 2015-09-15 ENCOUNTER — Ambulatory Visit
Admission: RE | Admit: 2015-09-15 | Discharge: 2015-09-15 | Disposition: A | Discharge status: Home / Self Care | Payer: Medicare Other | Source: Ambulatory Visit

## 2015-09-15 DIAGNOSIS — Z1231 Encounter for screening mammogram for malignant neoplasm of breast: Secondary | ICD-10-CM

## 2015-09-16 ENCOUNTER — Other Ambulatory Visit: Payer: Self-pay | Admitting: Cardiovascular Disease

## 2015-09-16 NOTE — Telephone Encounter (Signed)
Rx request sent to pharmacy.  

## 2015-09-26 ENCOUNTER — Telehealth: Payer: Self-pay

## 2015-09-26 DIAGNOSIS — E039 Hypothyroidism, unspecified: Secondary | ICD-10-CM

## 2015-09-26 NOTE — Telephone Encounter (Signed)
She has not been seen in over a year Def needs labs done

## 2015-09-26 NOTE — Telephone Encounter (Signed)
Patient called in to get a rx of synthroid she has enough to get her through the weekend 1 daily  [phone] pharmacy [facility] [address] [place] [address] states Linna Darner use to write them for her. Please advise

## 2015-09-27 ENCOUNTER — Other Ambulatory Visit: Payer: Self-pay | Admitting: Family Medicine

## 2015-09-27 MED ORDER — LEVOTHYROXINE SODIUM 75 MCG PO TABS: 75.0000 ug | ORAL_TABLET | Freq: Every day | ORAL | Status: DC

## 2015-09-27 NOTE — Telephone Encounter (Signed)
Patient scheduled for 09/29/15 at 10am patient states she is completely out and requesting 4 pills to hold her over until appointment. Please advise

## 2015-09-27 NOTE — Telephone Encounter (Signed)
Patient is requesting this be done asap

## 2015-09-27 NOTE — Telephone Encounter (Signed)
Call in 1 month

## 2015-09-27 NOTE — Telephone Encounter (Signed)
Forwarding to Kim 

## 2015-09-27 NOTE — Telephone Encounter (Signed)
Rx faxed.    KP 

## 2015-09-27 NOTE — Telephone Encounter (Signed)
Please advise      KP 

## 2015-09-29 ENCOUNTER — Other Ambulatory Visit (INDEPENDENT_AMBULATORY_CARE_PROVIDER_SITE_OTHER): Payer: Medicare Other

## 2015-09-29 DIAGNOSIS — E039 Hypothyroidism, unspecified: Secondary | ICD-10-CM | POA: Diagnosis not present

## 2015-09-29 LAB — TSH: TSH: 3.1 u[IU]/mL (ref 0.35–4.50)

## 2015-10-04 ENCOUNTER — Ambulatory Visit (INDEPENDENT_AMBULATORY_CARE_PROVIDER_SITE_OTHER): Payer: Medicare Other | Admitting: Thoracic Surgery (Cardiothoracic Vascular Surgery)

## 2015-10-04 ENCOUNTER — Ambulatory Visit
Admission: RE | Admit: 2015-10-04 | Discharge: 2015-10-04 | Disposition: A | Discharge status: Home / Self Care | Payer: Medicare Other | Source: Ambulatory Visit | Attending: Thoracic Surgery (Cardiothoracic Vascular Surgery) | Admitting: Thoracic Surgery (Cardiothoracic Vascular Surgery)

## 2015-10-04 ENCOUNTER — Other Ambulatory Visit: Payer: Self-pay | Admitting: *Deleted

## 2015-10-04 ENCOUNTER — Other Ambulatory Visit: Payer: Self-pay | Admitting: Thoracic Surgery (Cardiothoracic Vascular Surgery)

## 2015-10-04 ENCOUNTER — Encounter: Payer: Self-pay | Admitting: Thoracic Surgery (Cardiothoracic Vascular Surgery)

## 2015-10-04 VITALS — BP 127/71 | HR 60 | Resp 20 | Ht 64.0 in | Wt 243.0 lb

## 2015-10-04 DIAGNOSIS — I7101 Dissection of thoracic aorta: Secondary | ICD-10-CM | POA: Diagnosis not present

## 2015-10-04 DIAGNOSIS — E041 Nontoxic single thyroid nodule: Secondary | ICD-10-CM

## 2015-10-04 DIAGNOSIS — I71019 Dissection of thoracic aorta, unspecified: Secondary | ICD-10-CM

## 2015-10-04 DIAGNOSIS — Z9889 Other specified postprocedural states: Principal | ICD-10-CM

## 2015-10-04 DIAGNOSIS — Z951 Presence of aortocoronary bypass graft: Secondary | ICD-10-CM | POA: Diagnosis not present

## 2015-10-04 DIAGNOSIS — Z8679 Personal history of other diseases of the circulatory system: Secondary | ICD-10-CM

## 2015-10-04 LAB — CREATININE, ISTAT: CREATININE, ISTAT: 0.8 mg/dL (ref 0.6–1.3)

## 2015-10-04 MED ORDER — IOPAMIDOL (ISOVUE-370) INJECTION 76%
75.0000 mL | Freq: Once | INTRAVENOUS | Status: AC | PRN
  Administered 2015-10-04: 75 mL via INTRAVENOUS

## 2015-10-04 NOTE — Progress Notes (Signed)
[address]       [place] [address]             [phone]       HPI: Breanna Hernandez returns for a 2 year follow-up visit.  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   I last saw her in the office in March 2015. She was doing well at that time with no evidence of aneurysmal dilatation of the remainder of her aorta. I recommended annual CT follow-up, but she refused siting concerns of radiation exposure. She did agree to come back for a 2 year follow-up visit.  Overall she's been feeling well. She has not been having chest pain. She has noted "skipped beats."  Past Medical History  Diagnosis Date  . HTN (hypertension)   . Hypothyroidism   . 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 Diverticulosis   . Hyperplastic colon polyp   . Hyperlipidemia   . Obesity   . IBS (irritable bowel syndrome)   . Atrial fibrillation ([place])     h/o post-op 04/2010  . Renal cyst, left   . Fatty liver 01/10/07  . Breast cancer ([place])     x 3 cancer - radiation right x2 ''00,'05, left '07 mammosite radiation   . Lower extremity neuropathy     occ. affects walking and balance"foot to knees"  . 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      Current Outpatient Prescriptions  Medication Sig Dispense Refill  . acetaminophen (TYLENOL) 500 MG tablet Take 1,000 mg by mouth every 6 (six) hours as needed for moderate pain or headache.    Marland Kitchen amLODipine (NORVASC) 5 MG tablet TAKE 1 TABLET(5 MG) BY MOUTH DAILY 30 tablet 11  . aspirin 81 MG tablet Take 81 mg by mouth every morning.     . benazepril (LOTENSIN) 40 MG tablet TAKE 1 TABLET BY MOUTH EVERY DAY 90 tablet 3  . CALCIUM-MAGNESIUM-VITAMIN D PO Take 1 tablet by mouth 2 (two) times daily.     . fish oil-omega-3 fatty acids 1000 MG capsule Take 1 g by  mouth 2 (two) times daily.     . hydrochlorothiazide (MICROZIDE) 12.5 MG capsule TAKE 2 CAPSULES BY MOUTH EVERY DAY 180 capsule 1  . hydrocortisone 2.5 % cream Apply 1 application topically daily as needed (groin irritation).   0  . ibuprofen (ADVIL,MOTRIN) 200 MG tablet Take 400 mg by mouth every 6 (six) hours as needed for headache or moderate pain.    Marland Kitchen ketoconazole (NIZORAL) 2 % cream Apply 1 application topically daily as needed for irritation (groin).   2  . levothyroxine (SYNTHROID, LEVOTHROID) 75 MCG tablet TAKE 1 TABLET(75 MCG) BY MOUTH DAILY BEFORE BREAKFAST 30 tablet 0  . LORazepam (ATIVAN) 1 MG tablet 1 by mouth " as needed" and should not be taken on a regular basis. Regular use can increase risk of addiction but more importantly affect level of alertness and balance with increased risk of falling. 30 tablet 0  . metoprolol (LOPRESSOR) 50 MG tablet Take 1 tablet by mouth every morning and 1/2 tablet every evening. Please keep your upcoming appointment (09/22/15) for refills. 225 tablet 0  . OVER THE COUNTER MEDICATION Take 1 tablet by mouth every  morning. Product Name: EHT Mind enhancement formula Vitamin d 200 units Vitamin b 1.6 mg Vitamin b-12 10 mcg Selinum 70 mcg Coffee Extract 35 mg Alpha leporic acid 50 mg huperzine-a 50 mcg    . zoster vaccine live, PF, (ZOSTAVAX) 16109 UNT/0.65ML injection Inject 19,400 Units into the skin once. 1 each 0  . [DISCONTINUED] amLODipine-benazepril (LOTREL) 5-20 MG per capsule Take 1 capsule by mouth daily.      . [DISCONTINUED] escitalopram (LEXAPRO) 10 MG tablet Take 10 mg by mouth daily.      . [DISCONTINUED] lisinopril (PRINIVIL,ZESTRIL) 20 MG tablet Take 20 mg by mouth daily.       No current facility-administered medications for this visit.    Physical Exam BP 127/71 mmHg  Pulse 60  Resp 20  Ht 5\' 4"  (1.626 m)  Wt 243 lb (110.224 kg)  BMI 41.69 kg/m2  SpO48 23% 70 year old woman in no acute distress Alert and oriented 3 with no  focal deficits No carotid bruits Cardiac regular rate and rhythm normal S1 and S2, no rubs or murmurs Lungs clear with equal breath sounds bilaterally  Diagnostic Tests: CT ANGIOGRAPHY CHEST WITH CONTRAST  TECHNIQUE: Multidetector CT imaging of the chest was performed using the standard protocol during bolus administration of intravenous contrast. Multiplanar CT image reconstructions and MIPs were obtained to evaluate the vascular anatomy.  CONTRAST: 75 mL Isovue 370  COMPARISON: 06/23/2013 and 06/28/2011  FINDINGS: Cardiovascular: There is an aortic graft replacing the ascending thoracic aorta with the proximal aspect near the sinotubular junction. The graft is widely patent. Graft extends into the aortic arch. Common origin to the left common carotid artery and right innominate artery. Left subclavian artery is patent. Proximal descending thoracic aorta measures 3.5 cm and previously measured 3.4 cm. No evidence for an aortic dissection. Proximal abdominal aorta is patent. Celiac trunk, SMA and bilateral renal arteries are patent. Calcified small aneurysm near the splenic hilum measures 1.1 cm and stable. Main pulmonary arteries are patent. Right coronary bypass coming off the aortic graft. There is flow in the native left coronary arteries with calcified plaque.  Mediastinum/Nodes: Nodule along the inferior aspect of the thyroid tissue measures up to 1.6 cm, previously measured 1.5 cm in 2013. No suspicious mediastinal or hilar lymphadenopathy. No suspicious axillary lymphadenopathy. Again noted are surgical clips and postsurgical changes in the right breast. There is a small nodular density in the right breast that roughly measuring 2.0 x 1.3 cm and minimally changed since 2015. There is also a stable low-density structure in the left breast. No significant pericardial fluid.  Lungs/Pleura: No pleural effusions. Trachea and mainstem bronchi are patent. Lungs are  clear without airspace disease or consolidation. Small amount of pleural-based scarring along the posterior left lower lobe appears chronic. Punctate pleural-based nodular density along the right upper lobe on sequence 5, image 23. This was probably present on the study in 2015 and suggestive for a small benign finding.  Upper Abdomen: A low-density cyst in the left kidney upper pole. No acute abnormality in the upper abdomen.  Musculoskeletal: Prior median sternotomy. No suspicious bone findings.  Review of the MIP images confirms the above findings.  IMPRESSION: Stable appearance of thoracic aortic graft. No significant change in the appearance of the native thoracic aorta.  No acute abnormalities in the chest.  Minimal change in the thyroid nodule measuring up to 1.6 cm as described.  Stable small splenic artery aneurysm.   Electronically Signed  By: Markus Daft M.D.  On:  10/04/2015 13:08  I personally reviewed the CT chest to concur with the findings noted above.  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 She has no evidence of aneurysmal dilatation of her aorta. I again recommended annual follow-up. She refuses to do that but says she will come back in 2 years.  Hypertension-blood pressure well controlled on current regimen  Aortic atherosclerosis- refuses statins  Thyroid nodule- noted on CT and described as increasing from 1.5 in 2013 to 1.6 cm currently. I did not see mention of this on her previous CT reports. I suspect given the slow rate of growth that it would be benign. She is not aware of any workup for a thyroid nodule other being done previously. She does take Synthroid and was told that her TSH is within normal range. I'm going to order a thyroid ultrasound to try and get a better look at that and see if there is anything that we need to be concerned about.  Plan: Thyroid  ultrasound to further evaluate possible thyroid nodule versus goiter.  Return in 2 years with CT angiogram.  Melrose Nakayama, MD [facility] [phone]

## 2015-10-10 ENCOUNTER — Ambulatory Visit (HOSPITAL_COMMUNITY): Admission: RE | Admit: 2015-10-10 | Payer: Medicare Other | Source: Ambulatory Visit

## 2015-10-21 ENCOUNTER — Ambulatory Visit (HOSPITAL_COMMUNITY)
Admission: RE | Admit: 2015-10-21 | Discharge: 2015-10-21 | Disposition: A | Discharge status: Home / Self Care | Payer: Medicare Other | Source: Ambulatory Visit | Attending: Cardiothoracic Surgery | Admitting: Cardiothoracic Surgery

## 2015-10-21 DIAGNOSIS — E041 Nontoxic single thyroid nodule: Secondary | ICD-10-CM

## 2015-10-25 ENCOUNTER — Encounter: Payer: Self-pay | Admitting: Thoracic Surgery (Cardiothoracic Vascular Surgery)

## 2015-10-25 NOTE — Progress Notes (Signed)
Patient ID: Breanna Hernandez, female   DOB: [birth year], 70 y.o.   MRN: [id number]  Ultrasound showed heterogenous thyroid nodule 1.5 cm, recommended FNA- will arrange  attempted to call, but no answer  Will have my office arrange  Remo Lipps C. Roxan Hockey, MD [facility] [phone]

## 2015-11-01 ENCOUNTER — Other Ambulatory Visit: Payer: Self-pay | Admitting: Cardiovascular Disease

## 2015-11-01 ENCOUNTER — Other Ambulatory Visit: Payer: Self-pay | Admitting: *Deleted

## 2015-11-01 DIAGNOSIS — E041 Nontoxic single thyroid nodule: Secondary | ICD-10-CM

## 2015-11-01 NOTE — Telephone Encounter (Signed)
REFILL 

## 2015-11-02 ENCOUNTER — Other Ambulatory Visit: Payer: Self-pay | Admitting: Internal Medicine

## 2015-11-02 ENCOUNTER — Other Ambulatory Visit: Payer: Self-pay | Admitting: Cardiovascular Disease

## 2015-11-03 ENCOUNTER — Other Ambulatory Visit (HOSPITAL_BASED_OUTPATIENT_CLINIC_OR_DEPARTMENT_OTHER): Payer: Medicare Other

## 2015-11-03 ENCOUNTER — Encounter: Payer: Self-pay | Admitting: Hematology and Oncology

## 2015-11-03 ENCOUNTER — Ambulatory Visit (HOSPITAL_BASED_OUTPATIENT_CLINIC_OR_DEPARTMENT_OTHER): Payer: Medicare Other | Admitting: Hematology and Oncology

## 2015-11-03 ENCOUNTER — Telehealth: Payer: Self-pay | Admitting: Hematology and Oncology

## 2015-11-03 VITALS — BP 113/51 | HR 52 | Temp 97.7°F | Resp 18 | Ht 64.0 in | Wt 241.6 lb

## 2015-11-03 DIAGNOSIS — C541 Malignant neoplasm of endometrium: Secondary | ICD-10-CM

## 2015-11-03 DIAGNOSIS — Z853 Personal history of malignant neoplasm of breast: Secondary | ICD-10-CM | POA: Diagnosis present

## 2015-11-03 DIAGNOSIS — Z515 Encounter for palliative care: Secondary | ICD-10-CM | POA: Insufficient documentation

## 2015-11-03 DIAGNOSIS — R748 Abnormal levels of other serum enzymes: Secondary | ICD-10-CM | POA: Diagnosis not present

## 2015-11-03 DIAGNOSIS — Z8542 Personal history of malignant neoplasm of other parts of uterus: Secondary | ICD-10-CM | POA: Diagnosis not present

## 2015-11-03 DIAGNOSIS — E785 Hyperlipidemia, unspecified: Secondary | ICD-10-CM

## 2015-11-03 LAB — CBC WITH DIFFERENTIAL/PLATELET
BASO%: 0.2 % (ref 0.0–2.0)
BASOS ABS: 0 10*3/uL (ref 0.0–0.1)
EOS%: 0.6 % (ref 0.0–7.0)
Eosinophils Absolute: 0 10*3/uL (ref 0.0–0.5)
HEMATOCRIT: 37.1 % (ref 34.8–46.6)
HGB: 13.2 g/dL (ref 11.6–15.9)
LYMPH#: 1.9 10*3/uL (ref 0.9–3.3)
LYMPH%: 30.1 % (ref 14.0–49.7)
MCH: 34.6 pg — AB (ref 25.1–34.0)
MCHC: 35.6 g/dL (ref 31.5–36.0)
MCV: 97.4 fL (ref 79.5–101.0)
MONO#: 0.7 10*3/uL (ref 0.1–0.9)
MONO%: 10.7 % (ref 0.0–14.0)
NEUT#: 3.6 10*3/uL (ref 1.5–6.5)
NEUT%: 58.4 % (ref 38.4–76.8)
PLATELETS: 167 10*3/uL (ref 145–400)
RBC: 3.81 10*6/uL (ref 3.70–5.45)
RDW: 12.6 % (ref 11.2–14.5)
WBC: 6.2 10*3/uL (ref 3.9–10.3)

## 2015-11-03 LAB — COMPREHENSIVE METABOLIC PANEL
ALT: 78 U/L — AB (ref 0–55)
ANION GAP: 10 meq/L (ref 3–11)
AST: 85 U/L — ABNORMAL HIGH (ref 5–34)
Albumin: 3.6 g/dL (ref 3.5–5.0)
Alkaline Phosphatase: 76 U/L (ref 40–150)
BILIRUBIN TOTAL: 1.83 mg/dL — AB (ref 0.20–1.20)
BUN: 13.8 mg/dL (ref 7.0–26.0)
CALCIUM: 9.5 mg/dL (ref 8.4–10.4)
CHLORIDE: 102 meq/L (ref 98–109)
CO2: 28 mEq/L (ref 22–29)
CREATININE: 0.9 mg/dL (ref 0.6–1.1)
EGFR: 61 mL/min/{1.73_m2} — ABNORMAL LOW (ref >90)
Glucose: 100 mg/dl (ref 70–140)
Potassium: 3.6 mEq/L (ref 3.5–5.1)
Sodium: 140 mEq/L (ref 136–145)
TOTAL PROTEIN: 7.3 g/dL (ref 6.4–8.3)

## 2015-11-03 NOTE — Assessment & Plan Note (Addendum)
The patient has fatty liver disease and poorly controlled hyperlipidemia. We discussed aggressive risk factor management

## 2015-11-03 NOTE — Assessment & Plan Note (Signed)
She had early stage disease. Further imaging study is not needed

## 2015-11-03 NOTE — Progress Notes (Signed)
[place] OFFICE PROGRESS NOTE  Patient Care Team: Ann Held, DO as PCP - General (Family Medicine) Heath Lark, MD as Consulting Physician (Hematology and Oncology)  SUMMARY OF ONCOLOGIC HISTORY:  I reviewed the patient's records extensive and collaborated the history with the patient. Summary of her history is as follows: This patient had history of metachronous primary bilateral breast cancers. 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 Started on hormonal therapy with Aromasin which she stopped due to excessive weight gain. Of note, she took it for less than a year.  Most recent mammogram done 04/18/2012 showed a 4 mm nodular density upper inner quadrant left breast which was not seen on ultrasound. 6 month interval study recommended by the radiologist including ultrasound were normal.  The cancer was detected by screening mammogram. The cancer was not palpable prior to diagnosis In terms of breast cancer risk profile:  She menarched at early age of 36 and went to menopause at age 73  She had 6 pregnancy, her first child was born at age 64  She breast-fed all her children.  She never received birth control pills.  She was never exposed to fertility medications or hormone replacement therapy.  She has no family history of Breast/GYN/GI cancer.  Genetic screening in March 2016 was negative for hereditary breast/ovarian cancer In February 2016, she underwent hysterectomy for stage I endometrial cancer  INTERVAL HISTORY: Please see below for problem oriented charting. Her recent mammogram was within normal limits. She denies any recent abnormal breast examination, palpable mass, abnormal  breast appearance or nipple changes She had recent CT angiogram done. This is to follow on history of cardiovascular surgery. The patient is quite immobile due to morbid obesity. She denies lymphadenopathy. REVIEW OF SYSTEMS:   Constitutional: Denies fevers, chills or abnormal weight loss Eyes: Denies blurriness of vision Ears, nose, mouth, throat, and face: Denies mucositis or sore throat Respiratory: Denies cough, dyspnea or wheezes Cardiovascular: Denies palpitation, chest discomfort or lower extremity swelling Gastrointestinal:  Denies nausea, heartburn or change in bowel habits Skin: Denies abnormal skin rashes Lymphatics: Denies new lymphadenopathy or easy bruising Neurological:Denies numbness, tingling or new weaknesses Behavioral/Psych: Mood is stable, no new changes  All other systems were reviewed with the patient and are negative.  I have reviewed the past medical history, past surgical history, social history and family history with the patient and they are unchanged from previous note.  ALLERGIES:  has No Known Allergies.  MEDICATIONS:  Current Outpatient Prescriptions  Medication Sig Dispense Refill  . acetaminophen (TYLENOL) 500 MG tablet Take 1,000 mg by mouth every 6 (six) hours as needed for moderate pain or headache.    Marland Kitchen amLODipine (NORVASC) 5 MG tablet TAKE 1 TABLET(5 MG) BY MOUTH DAILY 30 tablet 11  . aspirin 81 MG tablet Take 81 mg by mouth every morning.     . benazepril (LOTENSIN) 40 MG tablet Take 1 tablet (40 mg total) by mouth daily. KEEP OV. 90 tablet 0  . CALCIUM-MAGNESIUM-VITAMIN D PO Take 1 tablet by mouth 2 (two) times daily.     . fish oil-omega-3 fatty acids 1000 MG capsule Take 1 g by mouth 2 (two) times daily.     . hydrochlorothiazide (MICROZIDE) 12.5 MG capsule TAKE  2 CAPSULES BY MOUTH EVERY DAY 180 capsule 1  . hydrocortisone 2.5 % cream Apply 1 application topically daily as needed (groin irritation).   0  . ibuprofen (ADVIL,MOTRIN) 200 MG  tablet Take 400 mg by mouth every 6 (six) hours as needed for headache or moderate pain.    Marland Kitchen ketoconazole (NIZORAL) 2 % cream Apply 1 application topically daily as needed for irritation (groin).   2  . levothyroxine (SYNTHROID, LEVOTHROID) 75 MCG tablet TAKE 1 TABLET(75 MCG) BY MOUTH DAILY BEFORE BREAKFAST 30 tablet 0  . LORazepam (ATIVAN) 1 MG tablet 1 by mouth " as needed" and should not be taken on a regular basis. Regular use can increase risk of addiction but more importantly affect level of alertness and balance with increased risk of falling. 30 tablet 0  . metoprolol (LOPRESSOR) 50 MG tablet Take 1 tablet by mouth every morning and 1/2 tablet every evening. Please keep your upcoming appointment (09/22/15) for refills. 225 tablet 0  . OVER THE COUNTER MEDICATION Take 1 tablet by mouth every morning. Product Name: EHT Mind enhancement formula Vitamin d 200 units Vitamin b 1.6 mg Vitamin b-12 10 mcg Selinum 70 mcg Coffee Extract 35 mg Alpha leporic acid 50 mg huperzine-a 50 mcg    . zoster vaccine live, PF, (ZOSTAVAX) 40973 UNT/0.65ML injection Inject 19,400 Units into the skin once. 1 each 0   No current facility-administered medications for this visit.     PHYSICAL EXAMINATION: ECOG PERFORMANCE STATUS: 1 - Symptomatic but completely ambulatory  Vitals:   11/03/15 1301  BP: (!) 113/51  Pulse: (!) 52  Resp: 18  Temp: 97.7 F (36.5 C)   Filed Weights   11/03/15 1301  Weight: 241 lb 9.6 oz (109.6 kg)    GENERAL:alert, no distress and comfortable. She is morbidly obese SKIN: skin color, texture, turgor are normal, no rashes or significant lesions EYES: normal, Conjunctiva are pink and non-injected, sclera clear OROPHARYNX:no exudate, no erythema and lips, buccal mucosa, and tongue normal  NECK: supple, thyroid normal size, non-tender, without nodularity LYMPH:  no palpable lymphadenopathy in the cervical, axillary or inguinal LUNGS: clear to auscultation and percussion with  normal breathing effort HEART: Occasionally irregular heart rate and rhythm with soft murmur and bilateral lower extremity edema ABDOMEN:abdomen soft, non-tender and normal bowel sounds. Limited exam due to morbid obesity Musculoskeletal:no cyanosis of digits and no clubbing  NEURO: alert & oriented x 3 with fluent speech, no focal motor/sensory deficits Bilateral chest wall examination showed no palpable abnormalities. Well-healed surgical scars are noted LABORATORY DATA:  I have reviewed the data as listed    Component Value Date/Time   NA 140 11/03/2015 1243   K 3.6 11/03/2015 1243   CL 102 09/20/2014 1555   CO2 28 11/03/2015 1243   GLUCOSE 100 11/03/2015 1243   BUN 13.8 11/03/2015 1243   CREATININE 0.9 11/03/2015 1243   CALCIUM 9.5 11/03/2015 1243   PROT 7.3 11/03/2015 1243   ALBUMIN 3.6 11/03/2015 1243   AST 85 (H) 11/03/2015 1243   ALT 78 (H) 11/03/2015 1243   ALKPHOS 76 11/03/2015 1243   BILITOT 1.83 (H) 11/03/2015 1243   GFRNONAA 85 (L) 05/19/2014 0442   GFRAA >90 05/19/2014 0442    No results found for: SPEP, UPEP  Lab Results  Component Value Date   WBC 6.2 11/03/2015   NEUTROABS 3.6 11/03/2015   HGB 13.2 11/03/2015   HCT 37.1 11/03/2015   MCV 97.4 11/03/2015   PLT 167 11/03/2015  Chemistry      Component Value Date/Time   NA 140 11/03/2015 1243   K 3.6 11/03/2015 1243   CL 102 09/20/2014 1555   CO2 28 11/03/2015 1243   BUN 13.8 11/03/2015 1243   CREATININE 0.9 11/03/2015 1243      Component Value Date/Time   CALCIUM 9.5 11/03/2015 1243   ALKPHOS 76 11/03/2015 1243   AST 85 (H) 11/03/2015 1243   ALT 78 (H) 11/03/2015 1243   BILITOT 1.83 (H) 11/03/2015 1243       RADIOGRAPHIC STUDIES:I reviewed recent CT angiogram and mammogram report I have personally reviewed the radiological images as listed and agreed with the findings in the report.    ASSESSMENT & PLAN:  BREAST CANCER, HX OF She had left breast cancer treated with local therapy,  intolerance to adjuvant Aromasin. Recent CT imaging show skin nodules but they are stable and mammogram is negative Examination today is benign I recommend continue history, physical examination and yearly mammogram. I will transition her care to cancer survivorship clinic next year  History of endometrial cancer She had early stage disease. Further imaging study is not needed  Hyperlipidemia The patient has fatty liver disease and poorly controlled hyperlipidemia. We discussed aggressive risk factor management  Elevated liver enzymes Elevated total bilirubin is related to Gilbert's syndrome She has elevated liver enzymes, fatty liver disease and morbid obesity. I recommend dietary modification and weight loss   Quality of life palliative care encounter We discussed increase physical activity and possible enrollment with the [employer] program with the [facility]. We discussed importance of weight loss.    Orders Placed This Encounter  Procedures  . Amb Referral to Survivorship Long term    Referral Priority:   Routine    Referral Type:   Consultation    Referred to Provider:   Holley Bouche, NP    Number of Visits Requested:   1   All questions were answered. The patient knows to call the clinic with any problems, questions or concerns. No barriers to learning was detected. I spent 20 minutes counseling the patient face to face. The total time spent in the appointment was 30 minutes and more than 50% was on counseling and review of test results     [facility], [place], MD 11/03/2015 2:46 PM

## 2015-11-03 NOTE — Telephone Encounter (Signed)
per pof to sch pt appt-gave pt copy of avs °

## 2015-11-03 NOTE — Assessment & Plan Note (Signed)
We discussed increase physical activity and possible enrollment with the [employer] program with the [facility]. We discussed importance of weight loss.

## 2015-11-03 NOTE — Assessment & Plan Note (Addendum)
She had left breast cancer treated with local therapy, intolerance to adjuvant Aromasin. Recent CT imaging show skin nodules but they are stable and mammogram is negative Examination today is benign I recommend continue history, physical examination and yearly mammogram. I will transition her care to cancer survivorship clinic next year

## 2015-11-03 NOTE — Assessment & Plan Note (Signed)
Elevated total bilirubin is related to Gilbert's syndrome She has elevated liver enzymes, fatty liver disease and morbid obesity. I recommend dietary modification and weight loss

## 2015-11-04 NOTE — Telephone Encounter (Signed)
Routing to patient's new pcp to handle 

## 2015-11-08 ENCOUNTER — Other Ambulatory Visit: Payer: Self-pay | Admitting: General Surgery

## 2015-11-08 DIAGNOSIS — C73 Malignant neoplasm of thyroid gland: Secondary | ICD-10-CM

## 2015-11-08 HISTORY — DX: Malignant neoplasm of thyroid gland: C73

## 2015-11-09 ENCOUNTER — Ambulatory Visit (HOSPITAL_COMMUNITY)
Admission: RE | Admit: 2015-11-09 | Discharge: 2015-11-09 | Disposition: A | Discharge status: Home / Self Care | Payer: Medicare Other | Source: Ambulatory Visit | Attending: Thoracic Surgery (Cardiothoracic Vascular Surgery) | Admitting: Thoracic Surgery (Cardiothoracic Vascular Surgery)

## 2015-11-09 ENCOUNTER — Other Ambulatory Visit: Payer: Self-pay | Admitting: Thoracic Surgery (Cardiothoracic Vascular Surgery)

## 2015-11-09 DIAGNOSIS — E041 Nontoxic single thyroid nodule: Secondary | ICD-10-CM

## 2015-11-09 MED ORDER — LIDOCAINE HCL 1 % IJ SOLN
INTRAMUSCULAR | Status: AC
  Filled 2015-11-09: qty 20

## 2015-11-10 ENCOUNTER — Other Ambulatory Visit: Payer: Self-pay

## 2015-11-15 ENCOUNTER — Telehealth: Payer: Self-pay | Admitting: Thoracic Surgery (Cardiothoracic Vascular Surgery)

## 2015-11-15 NOTE — Telephone Encounter (Signed)
I called Breanna Hernandez to discuss the path results with her.   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  I will inform Dr. Alvy Bimler and also refer Breanna Hernandez to Dr. Armandina Gemma for consultation  Revonda Standard. Roxan Hockey, MD [facility] [phone]

## 2015-11-16 ENCOUNTER — Encounter: Payer: Self-pay | Admitting: Cardiovascular Disease

## 2015-11-16 ENCOUNTER — Ambulatory Visit (INDEPENDENT_AMBULATORY_CARE_PROVIDER_SITE_OTHER): Payer: Medicare Other | Admitting: Cardiovascular Disease

## 2015-11-16 VITALS — BP 144/76 | HR 69 | Ht 64.0 in | Wt 240.0 lb

## 2015-11-16 DIAGNOSIS — I700 Atherosclerosis of aorta: Secondary | ICD-10-CM | POA: Diagnosis not present

## 2015-11-16 DIAGNOSIS — C73 Malignant neoplasm of thyroid gland: Secondary | ICD-10-CM | POA: Diagnosis not present

## 2015-11-16 DIAGNOSIS — Z8679 Personal history of other diseases of the circulatory system: Secondary | ICD-10-CM | POA: Diagnosis not present

## 2015-11-16 DIAGNOSIS — I10 Essential (primary) hypertension: Secondary | ICD-10-CM | POA: Diagnosis not present

## 2015-11-16 DIAGNOSIS — I2510 Atherosclerotic heart disease of native coronary artery without angina pectoris: Secondary | ICD-10-CM

## 2015-11-16 DIAGNOSIS — I480 Paroxysmal atrial fibrillation: Secondary | ICD-10-CM

## 2015-11-16 MED ORDER — METOPROLOL TARTRATE 50 MG PO TABS: 50.0000 mg | ORAL_TABLET | Freq: Two times a day (BID) | ORAL | 3 refills | Status: DC

## 2015-11-16 MED ORDER — AMLODIPINE BESYLATE 2.5 MG PO TABS: 2.5000 mg | ORAL_TABLET | Freq: Every day | ORAL | 3 refills | Status: DC

## 2015-11-16 NOTE — Progress Notes (Signed)
Cardiology Office Note    Date:  11/16/2015   ID:  Imo Alkhatib, DOB 03-30-1946, MRN [id number]  PCP:  Ann Held, DO  Cardiologist:   Sanda Klein, MD   Chief Complaint  Patient presents with  . Follow-up    History of Present Illness:  Breanna Hernandez is a 70 y.o. 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 She had a repeat CT scan of the chest with Dr. Roxan Hockey which showed a slight enlargement of a previously described right thyroid nodule. 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 She has not yet had a follow-up visit to discuss treatment.  She has little in the way of cardiac complications other than some recurrence of her palpitations. These are always isolated, never sustained. She often notices them at the end of the day or after eating. She has not experienced syncope.  The palpitations have become more obvious since she has been told of the presence of the thyroid abnormality. She has also had some bilateral ankle swelling especially after she sits for prolonged periods of time. She denies exertional dyspnea or angina. Her activities primarily limited by neuropathy in her feet.   She has fairly severe hypercholesterolemia but has refused to take statins consistently. She has a history of a normal nuclear stress test in 2011 and May 2015. She received a bypass to the right coronary artery due to the observation of a visible large plaque in the proximal RCA at the time of her aortic dissection. She has not had cardiac catheterization.  Past Medical History:  Diagnosis Date  . Atrial fibrillation ([place])    h/o post-op 04/2010  . Breast cancer  ([place])    x 3 cancer - radiation right x2 ''00,'05, left '07 mammosite radiation   . Diverticulosis   . 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  . Fatty liver 01/10/07  . 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 HTN (hypertension)   . Hyperlipidemia   . Hyperplastic colon polyp   . Hypothyroidism   . IBS (irritable bowel syndrome)   . Lower extremity neuropathy    occ. affects walking and balance"foot to knees"  . Obesity   . Renal cyst, left     Past Surgical History:  Procedure Laterality Date  . BREAST LUMPECTOMY  2000   Right x2, Left x1  . CABG x 1  05/03/2010   RCA -Dr Roxan Hockey (with type 1 aortic dissection)  . CHOLECYSTECTOMY    . COLONOSCOPY W/ POLYPECTOMY    . HYSTEROSCOPY W/D&C N/A 04/13/2014   Procedure: DILATATION AND CURETTAGE /HYSTEROSCOPY Polypectomy;  Surgeon: Maeola Sarah. Landry Mellow, MD;  Location: [facility];  Service: Gynecology;  Laterality: N/A;  Possible Polypectomy  . Median sternotomy, extracorporeal circulation, repair of type 1 aortic dissection with  tube graft from  sinotubular junction to take off of innominate artery, distal anastomoses under deep bypothermic circulatory arrest  05/03/2010   Dr Roxan Hockey  . ROBOTIC ASSISTED TOTAL HYSTERECTOMY WITH BILATERAL SALPINGO OOPHERECTOMY Bilateral 05/18/2014   Procedure: ROBOTIC ASSISTED TOTAL HYSTERECTOMY WITH BILATERAL SALPINGO OOPHORECTOMY SENTINAL LYMPH NODE MAPPING ;  Surgeon: Everitt Amber, MD;  Location: [facility];  Service: Gynecology;  Laterality: Bilateral;  .  TONSILLECTOMY      Current Medications: Outpatient Medications Prior to Visit  Medication Sig Dispense Refill  . acetaminophen (TYLENOL) 500 MG tablet Take 1,000 mg by mouth every 6 (six) hours as needed for moderate pain or headache.    Marland Kitchen aspirin 81 MG tablet Take 81 mg by mouth every morning.     . benazepril (LOTENSIN) 40 MG tablet Take 1 tablet (40 mg total) by mouth daily. KEEP OV. 90 tablet 0  .  CALCIUM-MAGNESIUM-VITAMIN D PO Take 1 tablet by mouth 2 (two) times daily.     . fish oil-omega-3 fatty acids 1000 MG capsule Take 1 g by mouth 2 (two) times daily.     . hydrochlorothiazide (MICROZIDE) 12.5 MG capsule TAKE 2 CAPSULES BY MOUTH EVERY DAY 180 capsule 1  . hydrocortisone 2.5 % cream Apply 1 application topically daily as needed (groin irritation).   0  . ibuprofen (ADVIL,MOTRIN) 200 MG tablet Take 400 mg by mouth every 6 (six) hours as needed for headache or moderate pain.    Marland Kitchen ketoconazole (NIZORAL) 2 % cream Apply 1 application topically daily as needed for irritation (groin).   2  . levothyroxine (SYNTHROID, LEVOTHROID) 75 MCG tablet TAKE 1 TABLET(75 MCG) BY MOUTH DAILY BEFORE BREAKFAST 30 tablet 0  . LORazepam (ATIVAN) 1 MG tablet TAKE 1 TABLET BY MOUTH AS NEEDED( NOT ON A REGULAR BASIS) 30 tablet 0  . OVER THE COUNTER MEDICATION Take 1 tablet by mouth every morning. Product Name: EHT Mind enhancement formula Vitamin d 200 units Vitamin b 1.6 mg Vitamin b-12 10 mcg Selinum 70 mcg Coffee Extract 35 mg Alpha leporic acid 50 mg huperzine-a 50 mcg    . zoster vaccine live, PF, (ZOSTAVAX) 16109 UNT/0.65ML injection Inject 19,400 Units into the skin once. 1 each 0  . amLODipine (NORVASC) 5 MG tablet TAKE 1 TABLET(5 MG) BY MOUTH DAILY 30 tablet 11  . metoprolol (LOPRESSOR) 50 MG tablet Take 1 tablet by mouth every morning and 1/2 tablet every evening. Please keep your upcoming appointment (09/22/15) for refills. 225 tablet 0   No facility-administered medications prior to visit.      Allergies:   Review of patient's allergies indicates no known allergies.   Social History   Social History  . Marital status: Married    Spouse name: N/A  . Number of children: N/A  . Years of education: N/A   Social History Main Topics  . Smoking status: Never Smoker  . Smokeless tobacco: Never Used  . Alcohol use No  . Drug use: No  . Sexual activity: Yes   Other Topics Concern  .  None   Social History Narrative  . None     Family History:  The patient's family history includes Alzheimer's disease in her mother; Aneurysm in her father; Goiter in her mother; Heart attack in her father; Hypertension in her brother and mother; Kidney disease in her brother.   ROS:   Please see the history of present illness.    ROS All other systems reviewed and are negative.   PHYSICAL EXAM:   VS:  BP (!) 144/76   Pulse 69   Ht 5\' 4"  (1.626 m)   Wt 240 lb (108.9 kg)   BMI 41.20 kg/m    GEN: Well nourished, well developed, in no acute distress  HEENT: normal  Neck: no JVD, carotid bruits, or masses Cardiac: RRR; no murmurs, rubs, or gallops, Symmetrical 1-2 plus ankle and pedal edema  Respiratory:  clear  to auscultation bilaterally, normal work of breathing GI: soft, nontender, nondistended, + BS MS: no deformity or atrophy  Skin: warm and dry, no rash Neuro:  Alert and Oriented x 3, Strength and sensation are intact Psych: euthymic mood, full affect  Wt Readings from Last 3 Encounters:  11/16/15 240 lb (108.9 kg)  11/03/15 241 lb 9.6 oz (109.6 kg)  10/04/15 243 lb (110.2 kg)      Studies/Labs Reviewed:   EKG:  EKG is ordered today.  The ekg ordered today demonstrates Tightness rhythm with a couple of premature atrial complexes, left ventricular hypertrophy by voltage criteria, chronic T-wave inversion in leads 1 and aVL, QTC 400 ms  Recent Labs: 09/29/2015: TSH 3.10 11/03/2015: ALT 78; BUN 13.8; Creatinine 0.9; HGB 13.2; Platelets 167; Potassium 3.6; Sodium 140   Lipid Panel    Component Value Date/Time   CHOL 263 (H) 08/17/2014 1134   TRIG 325 (H) 08/17/2014 1134   TRIG 293 (HH) 03/29/2006 1033   HDL 39 (L) 08/17/2014 1134   CHOLHDL 9 09/22/2013 0957   VLDL 62.0 (H) 09/22/2013 0957   LDLCALC 159 (H) 08/17/2014 1134   LDLDIRECT 148.3 03/29/2006 1033    Additional studies/ records that were reviewed today include:  CT angiogram of the chest images, notes  from Dr. Roxan Hockey and visit with Dr. Alvy Bimler in July  ASSESSMENT:    1. Essential hypertension   2. Coronary artery disease involving native coronary artery of native heart without angina pectoris   3. History of aortic dissection   4. Papillary thyroid carcinoma (HCC)   5. Paroxysmal atrial fibrillation ([place])   6. Aortic atherosclerosis (HCC)      PLAN:  In order of problems listed above:  1. CAD: Never symptomatic. I still think that she requires statin treatment for her severe hyperlipidemia. She also has evidence of atherosclerosis in the abdominal and thoracic aorta. She remains resistant to this suggestion and is now much more preoccupied by her thyroid problem. We'll revisit in the future. 2. HTN: Well controlled. When I rechecked it her blood pressure was 131/72 mmHg. She clearly has some anxiety related to the thyroid papillary carcinoma diagnosis. This is probably causing her increased palpitations. Will increase the dose of a beta blocker. Due to ankle edema will try to reduce the dose of her amlodipine. Asked her to call me back with some blood pressure readings in about a week or 2. 3. Ao dissection: No evidence of aortic enlargement by her recent CT angiogram 4. Thyroid CA: She has me several questions about this new diagnosis. It may possibly be related to her history of previous right breast radiation therapy on 2 occasions in 2000 and 2005. It seems to be very slow growing with very little change in size from 2013 to 2017 by CT. The histological type is likely to be responsive to treatment and I think her prognosis is good. I told her that obviously I am not in any way an authority on the subject and directed her questions to the appropriate specialists. She is scheduled to see Dr. Harlow Asa to discuss thyroidectomy and has an established relationship with Dr. Alvy Bimler as her oncologist. I think her risk of major cardiac complications with thyroid surgery is low. Blockers should be  continued throughout the perioperative period. Aspirin can be stopped temporarily if desired by the surgeon. 5. Hx postop AFib: Without subsequent recurrence    Medication Adjustments/Labs and Tests Ordered: Current medicines are reviewed at length with the patient today.  Concerns regarding medicines are outlined above.  Medication changes, Labs and Tests ordered today are listed in the Patient Instructions below. Patient Instructions  Dr Sallyanne Kuster has recommended making the following medication changes: 1. DECREASE Amlodipine to 2.5 mg daily 2. INCREASE Metoprolol to 50 TWICE daily  Your physician has requested that you regularly monitor and record your blood pressure readings at home. Please use the same machine at the same time of day to check your readings and record them to bring to your follow-up visit. Please purchase a blood pressure cuff and check your blood pressure 1-2 times daily.  Dr Sallyanne Kuster recommends that you schedule a follow-up appointment in 1 year. You will receive a reminder letter in the mail two months in advance. If you don't receive a letter, please call our office to schedule the follow-up appointment.  If you need a refill on your cardiac medications before your next appointment, please call your pharmacy.    Signed, Sanda Klein, MD  11/16/2015 4:40 PM    [facility] [place], [place], [place]  [address] Phone: [phone]; Fax: [phone]

## 2015-11-16 NOTE — Patient Instructions (Addendum)
Dr Sallyanne Kuster has recommended making the following medication changes: 1. DECREASE Amlodipine to 2.5 mg daily 2. INCREASE Metoprolol to 50 TWICE daily  Your physician has requested that you regularly monitor and record your blood pressure readings at home. Please use the same machine at the same time of day to check your readings and record them to bring to your follow-up visit. Please purchase a blood pressure cuff and check your blood pressure 1-2 times daily. Call in the about 2 weeks to speak with a nurse regarding your blood pressure readings  Dr Sallyanne Kuster recommends that you schedule a follow-up appointment in 1 year. You will receive a reminder letter in the mail two months in advance. If you don't receive a letter, please call our office to schedule the follow-up appointment.  If you need a refill on your cardiac medications before your next appointment, please call your pharmacy.

## 2015-11-17 ENCOUNTER — Telehealth: Payer: Self-pay | Admitting: Cardiovascular Disease

## 2015-11-17 ENCOUNTER — Other Ambulatory Visit: Payer: Self-pay | Admitting: Family Medicine

## 2015-11-17 NOTE — Telephone Encounter (Signed)
Returned patient call:  Made aware to stop amlodipine per MD Croitoru.  Reminded patient to keep BP log and call us in 1-2 weeks with BP recordings.    Advised to call with any further questions/concerns.  Pt verbalized understanding.

## 2015-11-17 NOTE — Telephone Encounter (Signed)
Returned patient call-patient states she had OV with MD Croitoru yesterday and medication changes were made:    Dr Sallyanne Kuster has recommended making the following medication changes: 1. DECREASE Amlodipine to 2.5 mg daily 2. INCREASE Metoprolol to 50 TWICE daily  Pt reports that she was refilling her pill box and noticed that she has been taking 1/2 tablet (2.5mg ) of amlodipine daily prior to visit.    From OV 09/2014, amlodipine was decreased to 2.5mg  daily.  Pt wanted to clarify-will route to MD for clarification.

## 2015-11-17 NOTE — Telephone Encounter (Signed)
Please ask her to stop the amlodipine altogether then. Please remind her to call us back with home BP recordings in 10 days or so.

## 2015-11-17 NOTE — Telephone Encounter (Signed)
New message   Pt verbalized that she is calling for rn because she has a question pertaining to her appt on  11/16/15 about the change in her Amlodipine medication she stated that she was already on the same dose that was suppose to be  change

## 2015-11-18 NOTE — Telephone Encounter (Signed)
Please schedule this patient an apt, she has not been seen in 1 yr.    KP

## 2015-11-18 NOTE — Addendum Note (Signed)
Addended by: Diana Eves on: 11/18/2015 09:18 AM   Modules accepted: Orders

## 2015-11-23 NOTE — Telephone Encounter (Signed)
Left message for patient to call and schedule appointment.

## 2015-12-13 ENCOUNTER — Telehealth: Payer: Self-pay

## 2015-12-13 NOTE — Telephone Encounter (Signed)
Request for surgical clearance:   1. What type surgery is being performed? Right thyroid lobectomy  2. When is this surgery scheduled? pending  3. Are there any medications that need to be held prior to surgery and how long? N/A; patient is taking ASA  4. Name of the physician performing surgery: Dr Armandina Gemma  5. What is the office phone and fax number?   Phone [phone]  Fax [phone] (attn: Mammie Lorenzo, [place])

## 2015-12-14 ENCOUNTER — Encounter: Payer: Self-pay | Admitting: Cardiovascular Disease

## 2015-12-14 NOTE — Telephone Encounter (Signed)
Sent via Epic

## 2015-12-16 NOTE — Telephone Encounter (Signed)
Clearance letter and office note faxed to Mammie Lorenzo, [place] at [facility].

## 2016-01-02 ENCOUNTER — Telehealth: Payer: Self-pay | Admitting: Cardiovascular Disease

## 2016-01-02 ENCOUNTER — Ambulatory Visit: Payer: Self-pay | Admitting: Surgery

## 2016-01-02 NOTE — Telephone Encounter (Signed)
New Message  Pt voiced wants to know if release form has been sent to [facility] [place] [facility].  Please f/u with pt

## 2016-01-02 NOTE — Telephone Encounter (Signed)
Returned call to patient.Advised a clearance letter was faxed to [facility] 12/16/15.Spoke to [facility] and surgical coordinator will be calling to schedule your surgery.

## 2016-01-06 ENCOUNTER — Encounter (HOSPITAL_COMMUNITY): Payer: Self-pay | Admitting: *Deleted

## 2016-01-06 NOTE — Progress Notes (Signed)
Pt is followed by Dr. Sallyanne Kuster for A-fib and CAD. Last office visit was 11/16/15 and clearance for surgery was noted. Pt denies any recent chest pain or sob.

## 2016-01-08 ENCOUNTER — Encounter (HOSPITAL_COMMUNITY): Payer: Self-pay | Admitting: Surgery

## 2016-01-08 NOTE — H&P (Signed)
[facility]  Breanna Hernandez DOB: 03-Nov-1945 Married / Language: English / Race: White Female  History of Present Illness  Patient words: new-thyroid cancer.  The patient is a 70 year old female who presents with thyroid cancer.  Patient is referred by Dr. Merilynn Finland for management of newly diagnosed papillary thyroid carcinoma. Patient's primary care physician is Dr. Garnet Koyanagi. Patient has a history of aortic surgery. She is followed with CT scans of the chest. Her most recent follow-up scan demonstrated a new right thyroid nodule. Patient underwent ultrasound exam on October 21, 2015. This showed a normal size thyroid gland which was heterogeneous. There was a solitary nodule in the inferior pole measuring 1.5 cm in size and containing punctate calcifications. Patient underwent ultrasound-guided fine-needle aspiration biopsy on November 09, 2015. This was consistent with papillary thyroid carcinoma, Bethesda category VI. Patient has a history of hypothyroidism. She takes levothyroxine 75 g daily. Most recent TSH level is normal at 3.10. Patient has had no prior neck surgery. She denies any compressive symptoms. There is no family history of thyroid cancer. Patient does have a personal history of breast cancer.   Allergies No Known Drug Allergies01/02/2016  Medication History AmLODIPine Besylate (5MG  Tablet, Oral) Active. Benazepril HCl (40MG  Tablet, Oral) Active. HydroCHLOROthiazide (12.5MG  Capsule, Oral) Active. Levothyroxine Sodium (75MCG Tablet, Oral) Active. Metoprolol Tartrate (50MG  Tablet, Oral) Active. Aspirin (81MG  Tablet DR, Oral) Active. Advil (200MG  Tablet, Oral) Active. Fish Oil (500MG  Capsule, Oral) Active. Calcium (500MG  Tablet, Oral) Active. Magnesium (500MG  Tablet, Oral) Active. Medications Reconciled  Vitals Weight: 247.6 lb Height: 64in Body Surface Area: 2.14 m Body Mass Index: 42.5  kg/m  Temp.: 98.12F(Oral)  Pulse: 58 (Regular)  BP: 130/84 (Sitting, Left Arm, Standard)   Physical Exam The physical exam findings are as follows: Note:General - appears comfortable, no distress; not diaphorectic  HEENT - normocephalic; sclerae clear, gaze conjugate; mucous membranes moist, dentition good; voice normal  Neck - symmetric on extension; no palpable anterior or posterior cervical adenopathy; no palpable masses in the thyroid bed  Chest - clear bilaterally without rhonchi, rales, or wheeze; well-healed median sternotomy incision  Cor - regular rhythm with normal rate; no significant murmur  Ext - non-tender; moderate edema bilateral ankles, greater on the left than on the right  Neuro - grossly intact; no tremor   Assessment & Plan  PAPILLARY THYROID CARCINOMA (C73)  Patient presents with newly diagnosed papillary thyroid carcinoma. Patient is provided with written literature on thyroid surgery to review at home.  Patient has a 1.5 cm papillary thyroid carcinoma in the inferior pole of the right thyroid lobe. The remainder the gland is slightly heterogeneous but without other nodules. There is no sign of lymphadenopathy.  I have recommended proceeding with right thyroid lobectomy. Hopefully this will be adequate treatment. We will have her pathology reviewed and have her evaluated by endocrinology, likely by Dr. Philemon Kingdom, postoperatively.  Patient will require preoperative cardiac clearance from her cardiologist.  Patient discussed the procedure. We discussed the location of the surgical incision. We discussed an overnight hospital stay. We discussed the potential for recurrent laryngeal nerve injury and injury to parathyroid glands. We discussed the possibility of additional surgery or radioactive iodine treatment depending on the final pathologic results. She understands and wishes to proceed in the near future.  The risks and benefits of the  procedure have been discussed at length with the patient. The patient understands the proposed procedure, potential alternative treatments, and  the course of recovery to be expected. All of the patient's questions have been answered at this time. The patient wishes to proceed with surgery.  Earnstine Regal, MD, [facility] Office: [phone]

## 2016-01-09 ENCOUNTER — Observation Stay (HOSPITAL_COMMUNITY)
Admission: RE | Admit: 2016-01-09 | Discharge: 2016-01-10 | Disposition: A | Discharge status: Home / Self Care | Payer: Medicare Other | Source: Ambulatory Visit | Attending: Surgery | Admitting: Surgery

## 2016-01-09 ENCOUNTER — Ambulatory Visit (HOSPITAL_COMMUNITY): Payer: Medicare Other | Admitting: Certified Registered Nurse Anesthetist

## 2016-01-09 ENCOUNTER — Ambulatory Visit (HOSPITAL_COMMUNITY): Payer: Medicare Other

## 2016-01-09 ENCOUNTER — Encounter (HOSPITAL_COMMUNITY)
Admission: RE | Disposition: A | Discharge status: Home / Self Care | Payer: Self-pay | Source: Ambulatory Visit | Attending: Surgery

## 2016-01-09 ENCOUNTER — Encounter (HOSPITAL_COMMUNITY): Payer: Self-pay | Admitting: Surgery

## 2016-01-09 DIAGNOSIS — Z9889 Other specified postprocedural states: Secondary | ICD-10-CM | POA: Insufficient documentation

## 2016-01-09 DIAGNOSIS — Z01818 Encounter for other preprocedural examination: Secondary | ICD-10-CM

## 2016-01-09 DIAGNOSIS — C73 Malignant neoplasm of thyroid gland: Principal | ICD-10-CM | POA: Diagnosis present

## 2016-01-09 DIAGNOSIS — Z79899 Other long term (current) drug therapy: Secondary | ICD-10-CM | POA: Diagnosis not present

## 2016-01-09 DIAGNOSIS — Z7982 Long term (current) use of aspirin: Secondary | ICD-10-CM | POA: Insufficient documentation

## 2016-01-09 DIAGNOSIS — E063 Autoimmune thyroiditis: Secondary | ICD-10-CM | POA: Diagnosis not present

## 2016-01-09 DIAGNOSIS — Z853 Personal history of malignant neoplasm of breast: Secondary | ICD-10-CM | POA: Insufficient documentation

## 2016-01-09 HISTORY — DX: Headache: R51

## 2016-01-09 HISTORY — PX: THYROIDECTOMY: SHX17

## 2016-01-09 HISTORY — DX: Unspecified osteoarthritis, unspecified site: M19.90

## 2016-01-09 HISTORY — DX: Headache, unspecified: R51.9

## 2016-01-09 HISTORY — PX: TOTAL THYROIDECTOMY: SHX2547

## 2016-01-09 HISTORY — DX: Malignant neoplasm of thyroid gland: C73

## 2016-01-09 HISTORY — DX: Gastro-esophageal reflux disease without esophagitis: K21.9

## 2016-01-09 LAB — CBC
HCT: 38.1 % (ref 36.0–46.0)
HEMOGLOBIN: 13.1 g/dL (ref 12.0–15.0)
MCH: 34.6 pg — ABNORMAL HIGH (ref 26.0–34.0)
MCHC: 34.4 g/dL (ref 30.0–36.0)
MCV: 100.5 fL — ABNORMAL HIGH (ref 78.0–100.0)
PLATELETS: 177 10*3/uL (ref 150–400)
RBC: 3.79 MIL/uL — AB (ref 3.87–5.11)
RDW: 12.6 % (ref 11.5–15.5)
WBC: 8 10*3/uL (ref 4.0–10.5)

## 2016-01-09 LAB — BASIC METABOLIC PANEL
ANION GAP: 8 (ref 5–15)
BUN: 13 mg/dL (ref 6–20)
CALCIUM: 9.7 mg/dL (ref 8.9–10.3)
CO2: 26 mmol/L (ref 22–32)
CREATININE: 0.8 mg/dL (ref 0.44–1.00)
Chloride: 105 mmol/L (ref 101–111)
Glucose, Bld: 100 mg/dL — ABNORMAL HIGH (ref 65–99)
Potassium: 3.8 mmol/L (ref 3.5–5.1)
SODIUM: 139 mmol/L (ref 135–145)

## 2016-01-09 SURGERY — THYROIDECTOMY
Anesthesia: General | Site: Neck

## 2016-01-09 MED ORDER — KCL IN DEXTROSE-NACL 20-5-0.45 MEQ/L-%-% IV SOLN
INTRAVENOUS | Status: DC
  Administered 2016-01-09: 17:00:00 via INTRAVENOUS
  Filled 2016-01-09 (×2): qty 1000

## 2016-01-09 MED ORDER — HYDROCODONE-ACETAMINOPHEN 7.5-325 MG PO TABS: 1.0000 | ORAL_TABLET | Freq: Once | ORAL | Status: DC | PRN

## 2016-01-09 MED ORDER — LACTATED RINGERS IV SOLN
INTRAVENOUS | Status: DC
  Administered 2016-01-09: 10:00:00 via INTRAVENOUS

## 2016-01-09 MED ORDER — HYDROCODONE-ACETAMINOPHEN 5-325 MG PO TABS
1.0000 | ORAL_TABLET | ORAL | Status: DC | PRN
  Administered 2016-01-10 (×2): 2 via ORAL
  Filled 2016-01-09 (×2): qty 2

## 2016-01-09 MED ORDER — HYDROCHLOROTHIAZIDE 12.5 MG PO CAPS
25.0000 mg | ORAL_CAPSULE | Freq: Every day | ORAL | Status: DC
  Filled 2016-01-09: qty 2

## 2016-01-09 MED ORDER — 0.9 % SODIUM CHLORIDE (POUR BTL) OPTIME
TOPICAL | Status: DC | PRN
  Administered 2016-01-09: 1000 mL

## 2016-01-09 MED ORDER — ACETAMINOPHEN 650 MG RE SUPP: 650.0000 mg | Freq: Four times a day (QID) | RECTAL | Status: DC | PRN

## 2016-01-09 MED ORDER — PROMETHAZINE HCL 25 MG/ML IJ SOLN: 6.2500 mg | INTRAMUSCULAR | Status: DC | PRN

## 2016-01-09 MED ORDER — SUGAMMADEX SODIUM 200 MG/2ML IV SOLN
INTRAVENOUS | Status: DC | PRN
  Administered 2016-01-09: 200 mg via INTRAVENOUS

## 2016-01-09 MED ORDER — ONDANSETRON 4 MG PO TBDP: 4.0000 mg | ORAL_TABLET | Freq: Four times a day (QID) | ORAL | Status: DC | PRN

## 2016-01-09 MED ORDER — LIDOCAINE HCL (CARDIAC) 20 MG/ML IV SOLN
INTRAVENOUS | Status: DC | PRN
  Administered 2016-01-09: 60 mg via INTRAVENOUS

## 2016-01-09 MED ORDER — PHENYLEPHRINE HCL 10 MG/ML IJ SOLN
INTRAMUSCULAR | Status: DC | PRN
  Administered 2016-01-09 (×2): 80 ug via INTRAVENOUS

## 2016-01-09 MED ORDER — BENAZEPRIL HCL 40 MG PO TABS
40.0000 mg | ORAL_TABLET | Freq: Every day | ORAL | Status: DC
  Filled 2016-01-09 (×2): qty 1

## 2016-01-09 MED ORDER — CALCIUM CARBONATE 1250 (500 CA) MG PO TABS
2.0000 | ORAL_TABLET | Freq: Three times a day (TID) | ORAL | Status: DC
  Administered 2016-01-10: 1000 mg via ORAL
  Filled 2016-01-09 (×2): qty 1

## 2016-01-09 MED ORDER — ONDANSETRON HCL 4 MG/2ML IJ SOLN: 4.0000 mg | Freq: Four times a day (QID) | INTRAMUSCULAR | Status: DC | PRN

## 2016-01-09 MED ORDER — FENTANYL CITRATE (PF) 100 MCG/2ML IJ SOLN
25.0000 ug | INTRAMUSCULAR | Status: DC | PRN
  Administered 2016-01-09: 25 ug via INTRAVENOUS
  Administered 2016-01-09: 50 ug via INTRAVENOUS
  Administered 2016-01-09: 25 ug via INTRAVENOUS
  Administered 2016-01-09: 50 ug via INTRAVENOUS

## 2016-01-09 MED ORDER — HEMOSTATIC AGENTS (NO CHARGE) OPTIME
TOPICAL | Status: DC | PRN
Start: 2016-01-09 — End: 2016-01-09
  Administered 2016-01-09: 1 via TOPICAL

## 2016-01-09 MED ORDER — LACTATED RINGERS IV SOLN
INTRAVENOUS | Status: DC | PRN
  Administered 2016-01-09 (×2): via INTRAVENOUS

## 2016-01-09 MED ORDER — PROPOFOL 10 MG/ML IV BOLUS
INTRAVENOUS | Status: DC | PRN
  Administered 2016-01-09: 200 mg via INTRAVENOUS

## 2016-01-09 MED ORDER — CEFAZOLIN SODIUM-DEXTROSE 2-4 GM/100ML-% IV SOLN
2.0000 g | INTRAVENOUS | Status: AC
  Administered 2016-01-09: 2 g via INTRAVENOUS
  Filled 2016-01-09: qty 100

## 2016-01-09 MED ORDER — FENTANYL CITRATE (PF) 100 MCG/2ML IJ SOLN
INTRAMUSCULAR | Status: AC
Start: 2016-01-09 — End: 2016-01-10
  Filled 2016-01-09: qty 2

## 2016-01-09 MED ORDER — FENTANYL CITRATE (PF) 100 MCG/2ML IJ SOLN
INTRAMUSCULAR | Status: AC
  Filled 2016-01-09: qty 2

## 2016-01-09 MED ORDER — METHOCARBAMOL 1000 MG/10ML IJ SOLN
500.0000 mg | Freq: Once | INTRAMUSCULAR | Status: AC
  Administered 2016-01-09: 500 mg via INTRAVENOUS
  Filled 2016-01-09 (×2): qty 5

## 2016-01-09 MED ORDER — CHLORHEXIDINE GLUCONATE CLOTH 2 % EX PADS: 6.0000 | MEDICATED_PAD | Freq: Once | CUTANEOUS | Status: DC

## 2016-01-09 MED ORDER — ACETAMINOPHEN 325 MG PO TABS: 650.0000 mg | ORAL_TABLET | Freq: Four times a day (QID) | ORAL | Status: DC | PRN

## 2016-01-09 MED ORDER — GLYCOPYRROLATE 0.2 MG/ML IV SOSY
PREFILLED_SYRINGE | INTRAVENOUS | Status: AC
Start: 2016-01-09 — End: 2016-01-09
  Filled 2016-01-09: qty 6

## 2016-01-09 MED ORDER — ROCURONIUM BROMIDE 100 MG/10ML IV SOLN
INTRAVENOUS | Status: DC | PRN
  Administered 2016-01-09: 50 mg via INTRAVENOUS
  Administered 2016-01-09: 10 mg via INTRAVENOUS

## 2016-01-09 MED ORDER — EPHEDRINE SULFATE 50 MG/ML IJ SOLN
INTRAMUSCULAR | Status: DC | PRN
  Administered 2016-01-09: 15 mg via INTRAVENOUS

## 2016-01-09 MED ORDER — ONDANSETRON HCL 4 MG/2ML IJ SOLN
INTRAMUSCULAR | Status: AC
Start: 2016-01-09 — End: 2016-01-09
  Filled 2016-01-09: qty 2

## 2016-01-09 MED ORDER — ONDANSETRON HCL 4 MG/2ML IJ SOLN
INTRAMUSCULAR | Status: DC | PRN
  Administered 2016-01-09: 4 mg via INTRAVENOUS

## 2016-01-09 MED ORDER — FENTANYL CITRATE (PF) 100 MCG/2ML IJ SOLN
INTRAMUSCULAR | Status: DC | PRN
  Administered 2016-01-09 (×2): 50 ug via INTRAVENOUS
  Administered 2016-01-09: 100 ug via INTRAVENOUS

## 2016-01-09 MED ORDER — PHENYLEPHRINE HCL 10 MG/ML IJ SOLN
INTRAMUSCULAR | Status: DC | PRN
  Administered 2016-01-09: 50 ug/min via INTRAVENOUS

## 2016-01-09 MED ORDER — PROPOFOL 10 MG/ML IV BOLUS
INTRAVENOUS | Status: AC
  Filled 2016-01-09: qty 20

## 2016-01-09 MED ORDER — HYDROMORPHONE HCL 1 MG/ML IJ SOLN
1.0000 mg | INTRAMUSCULAR | Status: DC | PRN
  Administered 2016-01-09 (×2): 1 mg via INTRAVENOUS
  Filled 2016-01-09 (×2): qty 1

## 2016-01-09 MED ORDER — METOPROLOL TARTRATE 50 MG PO TABS
50.0000 mg | ORAL_TABLET | Freq: Two times a day (BID) | ORAL | Status: DC
  Administered 2016-01-09: 50 mg via ORAL
  Filled 2016-01-09 (×2): qty 1

## 2016-01-09 MED ORDER — NEOSTIGMINE METHYLSULFATE 5 MG/5ML IV SOSY
PREFILLED_SYRINGE | INTRAVENOUS | Status: AC
  Filled 2016-01-09: qty 5

## 2016-01-09 SURGICAL SUPPLY — 60 items
APL SKNCLS STERI-STRIP NONHPOA (GAUZE/BANDAGES/DRESSINGS) ×2
ATTRACTOMAT 16X20 MAGNETIC DRP (DRAPES) ×4 IMPLANT
BENZOIN TINCTURE PRP APPL 2/3 (GAUZE/BANDAGES/DRESSINGS) ×4 IMPLANT
BLADE SURG 10 STRL SS (BLADE) ×4 IMPLANT
BLADE SURG 15 STRL LF DISP TIS (BLADE) ×2 IMPLANT
BLADE SURG 15 STRL SS (BLADE) ×4
BLADE SURG ROTATE 9660 (MISCELLANEOUS) IMPLANT
CANISTER SUCTION 2500CC (MISCELLANEOUS) ×4 IMPLANT
CHLORAPREP W/TINT 10.5 ML (MISCELLANEOUS) ×4 IMPLANT
CLIP TI MEDIUM 24 (CLIP) ×7 IMPLANT
CLIP TI WIDE RED SMALL 24 (CLIP) ×7 IMPLANT
CLOSURE WOUND 1/2 X4 (GAUZE/BANDAGES/DRESSINGS) ×1
CLOSURE WOUND 1/4X4 (GAUZE/BANDAGES/DRESSINGS) ×1
CONT SPEC 4OZ CLIKSEAL STRL BL (MISCELLANEOUS) ×6 IMPLANT
COVER SURGICAL LIGHT HANDLE (MISCELLANEOUS) ×4 IMPLANT
CRADLE DONUT ADULT HEAD (MISCELLANEOUS) ×4 IMPLANT
DRAPE LAPAROTOMY T 98X78 PEDS (DRAPES) ×4 IMPLANT
DRAPE UTILITY XL STRL (DRAPES) ×4 IMPLANT
ELECT CAUTERY BLADE 6.4 (BLADE) ×4 IMPLANT
ELECT REM PT RETURN 9FT ADLT (ELECTROSURGICAL) ×4
ELECTRODE REM PT RTRN 9FT ADLT (ELECTROSURGICAL) ×2 IMPLANT
GAUZE SPONGE 4X4 12PLY STRL (GAUZE/BANDAGES/DRESSINGS) ×4 IMPLANT
GAUZE SPONGE 4X4 16PLY XRAY LF (GAUZE/BANDAGES/DRESSINGS) ×7 IMPLANT
GLOVE BIO SURGEON STRL SZ 6.5 (GLOVE) ×2 IMPLANT
GLOVE BIO SURGEONS STRL SZ 6.5 (GLOVE) ×1
GLOVE BIOGEL PI IND STRL 6.5 (GLOVE) ×2 IMPLANT
GLOVE BIOGEL PI IND STRL 7.0 (GLOVE) ×2 IMPLANT
GLOVE BIOGEL PI INDICATOR 6.5 (GLOVE) ×4
GLOVE BIOGEL PI INDICATOR 7.0 (GLOVE) ×4
GLOVE SURG ORTHO 8.0 STRL STRW (GLOVE) ×4 IMPLANT
GLOVE SURG SS PI 6.5 STRL IVOR (GLOVE) ×9 IMPLANT
GLOVE SURG SS PI 7.0 STRL IVOR (GLOVE) ×3 IMPLANT
GOWN STRL REUS W/ TWL LRG LVL3 (GOWN DISPOSABLE) ×4 IMPLANT
GOWN STRL REUS W/ TWL XL LVL3 (GOWN DISPOSABLE) ×2 IMPLANT
GOWN STRL REUS W/TWL LRG LVL3 (GOWN DISPOSABLE) ×12
GOWN STRL REUS W/TWL XL LVL3 (GOWN DISPOSABLE) ×4
HEMOSTAT SURGICEL 2X4 FIBR (HEMOSTASIS) ×7 IMPLANT
ILLUMINATOR WAVEGUIDE N/F (MISCELLANEOUS) ×3 IMPLANT
KIT BASIN OR (CUSTOM PROCEDURE TRAY) ×4 IMPLANT
KIT ROOM TURNOVER OR (KITS) ×4 IMPLANT
NS IRRIG 1000ML POUR BTL (IV SOLUTION) ×4 IMPLANT
PACK SURGICAL SETUP 50X90 (CUSTOM PROCEDURE TRAY) ×4 IMPLANT
PAD ARMBOARD 7.5X6 YLW CONV (MISCELLANEOUS) ×4 IMPLANT
PENCIL BUTTON HOLSTER BLD 10FT (ELECTRODE) ×4 IMPLANT
SHEARS HARMONIC 9CM CVD (BLADE) ×4 IMPLANT
SPECIMEN JAR MEDIUM (MISCELLANEOUS) IMPLANT
SPONGE GAUZE 4X4 12PLY STER LF (GAUZE/BANDAGES/DRESSINGS) ×3 IMPLANT
SPONGE INTESTINAL PEANUT (DISPOSABLE) ×4 IMPLANT
STRIP CLOSURE SKIN 1/2X4 (GAUZE/BANDAGES/DRESSINGS) ×3 IMPLANT
STRIP CLOSURE SKIN 1/4X4 (GAUZE/BANDAGES/DRESSINGS) ×2 IMPLANT
SUT MNCRL AB 4-0 PS2 18 (SUTURE) ×4 IMPLANT
SUT SILK 2 0 (SUTURE) ×4
SUT SILK 2-0 18XBRD TIE 12 (SUTURE) ×2 IMPLANT
SUT VIC AB 3-0 SH 18 (SUTURE) ×7 IMPLANT
SYR BULB 3OZ (MISCELLANEOUS) ×4 IMPLANT
TAPE CLOTH SURG 4X10 WHT LF (GAUZE/BANDAGES/DRESSINGS) ×3 IMPLANT
TOWEL OR 17X24 6PK STRL BLUE (TOWEL DISPOSABLE) ×4 IMPLANT
TOWEL OR 17X26 10 PK STRL BLUE (TOWEL DISPOSABLE) ×4 IMPLANT
TUBE CONNECTING 12'X1/4 (SUCTIONS) ×1
TUBE CONNECTING 12X1/4 (SUCTIONS) ×3 IMPLANT

## 2016-01-09 NOTE — Anesthesia Postprocedure Evaluation (Signed)
Anesthesia Post Note  Patient: Publishing copy  Procedure(s) Performed: Procedure(s) (LRB): TOTAL THYROIDECTOMY (N/A)  Patient location during evaluation: PACU Anesthesia Type: General Level of consciousness: awake and alert Pain management: pain level controlled Vital Signs Assessment: post-procedure vital signs reviewed and stable Respiratory status: spontaneous breathing, nonlabored ventilation, respiratory function stable and patient connected to nasal cannula oxygen Cardiovascular status: blood pressure returned to baseline and stable Postop Assessment: no signs of nausea or vomiting Anesthetic complications: no    Last Vitals:  Vitals:   01/09/16 1400 01/09/16 1415  BP: 126/60 132/61  Pulse: (!) 59 62  Resp: 18 13  Temp:      Last Pain:  Vitals:   01/09/16 1415  TempSrc:   PainSc: [name]

## 2016-01-09 NOTE — Transfer of Care (Signed)
Immediate Anesthesia Transfer of Care Note  Patient: Breanna Hernandez  Procedure(s) Performed: Procedure(s): TOTAL THYROIDECTOMY (N/A)  Patient Location: PACU  Anesthesia Type:General  Level of Consciousness: awake, alert  and oriented  Airway & Oxygen Therapy: Patient Spontanous Breathing and Patient connected to nasal cannula oxygen  Post-op Assessment: Report given to RN, Post -op Vital signs reviewed and stable and Patient moving all extremities X 4  Post vital signs: Reviewed and stable  Last Vitals:  Vitals:   01/09/16 0849 01/09/16 1315  BP: (!) 140/57   Pulse: (!) 54 (!) 59  Resp: 18 15  Temp: 36.7 C 36.7 C    Last Pain:  Vitals:   01/09/16 0849  TempSrc: Oral         Complications: No apparent anesthesia complications

## 2016-01-09 NOTE — Anesthesia Procedure Notes (Signed)
Procedure Name: Intubation Date/Time: 01/09/2016 11:23 AM Performed by: Gaylene Brooks Pre-anesthesia Checklist: Patient identified, Emergency Drugs available, Suction available and Patient being monitored Patient Re-evaluated:Patient Re-evaluated prior to inductionOxygen Delivery Method: Circle System Utilized Preoxygenation: Pre-oxygenation with 100% oxygen Intubation Type: IV induction Ventilation: Mask ventilation without difficulty Laryngoscope Size: Miller and 2 Grade View: Grade I Tube type: Oral Tube size: 7.0 mm Number of attempts: 1 Airway Equipment and Method: Stylet and Oral airway Placement Confirmation: ETT inserted through vocal cords under direct vision,  positive ETCO2 and breath sounds checked- equal and bilateral Secured at: 22 cm Tube secured with: Tape Dental Injury: Teeth and Oropharynx as per pre-operative assessment

## 2016-01-09 NOTE — Anesthesia Preprocedure Evaluation (Signed)
Anesthesia Evaluation  Patient identified by MRN, date of birth, ID band Patient awake    Reviewed: Allergy & Precautions, H&P , NPO status , Patient's Chart, lab work & pertinent test results, reviewed documented beta blocker date and time   Airway Mallampati: II  TM Distance: >3 FB Neck ROM: full    Dental no notable dental hx. (+)    Pulmonary neg pulmonary ROS,    Pulmonary exam normal breath sounds clear to auscultation       Cardiovascular hypertension, On Medications and Pt. on medications + CAD, + CABG and + Peripheral Vascular Disease  + dysrhythmias Atrial Fibrillation  Rhythm:regular Rate:Normal  Hx/o Aortic Dissection repaired 2012   Neuro/Psych  Neuromuscular disease    GI/Hepatic Neg liver ROS, GERD  ,  Endo/Other  Hypothyroidism Morbid obesity  Renal/GU Renal InsufficiencyRenal disease     Musculoskeletal negative musculoskeletal ROS (+) Arthritis ,   Abdominal   Peds  Hematology   Anesthesia Other Findings   Reproductive/Obstetrics negative OB ROS                             Anesthesia Physical  Anesthesia Plan  ASA: III  Anesthesia Plan: General   Post-op Pain Management:    Induction: Intravenous  Airway Management Planned: Oral ETT  Additional Equipment:   Intra-op Plan:   Post-operative Plan: Extubation in OR  Informed Consent: I have reviewed the patients History and Physical, chart, labs and discussed the procedure including the risks, benefits and alternatives for the proposed anesthesia with the patient or authorized representative who has indicated his/her understanding and acceptance.   Dental Advisory Given and Dental advisory given  Plan Discussed with: CRNA, Surgeon and Anesthesiologist  Anesthesia Plan Comments: (D)        Anesthesia Quick Evaluation

## 2016-01-09 NOTE — Op Note (Signed)
Procedure Note  Pre-operative Diagnosis:  Papillary thyroid carcinoma  Post-operative Diagnosis:  same  Surgeon:  Velora Hecklerodd M. Nakkia Mackiewicz, MD, FACS  Assistant:  Feliciana RossettiLuke Kinsinger, MD   Procedure:  Total thyroidectomy  Anesthesia:  General  Estimated Blood Loss:  minimal  Drains: none         Specimen: thyroid to pathology  Indications:  The patient is a 70 year old female who presents with thyroid cancer. Patient is referred by Dr. Andrey SpearmanSteve Hendrickson for management of newly diagnosed papillary thyroid carcinoma. Patient's primary care physician is Dr. Loreen FreudYvonne Lowne. Patient has a history of aortic surgery. She is followed with CT scans of the chest. Her most recent follow-up scan demonstrated a new right thyroid nodule. Patient underwent ultrasound exam on October 21, 2015. This showed a normal size thyroid gland which was heterogeneous. There was a solitary nodule in the inferior pole measuring 1.5 cm in size and containing punctate calcifications. Patient underwent ultrasound-guided fine-needle aspiration biopsy on November 09, 2015. This was consistent with papillary thyroid carcinoma, Bethesda category VI.   Procedure Details: [facility]  The patient was brought to the operating room and placed in a supine position on the operating room table.  Following administration of general anesthesia, the patient was positioned and then prepped and draped in the usual aseptic fashion.  After ascertaining that an adequate level of anesthesia had been achieved, a Kocher incision was made with #15 blade.  Dissection was carried through subcutaneous tissues and platysma. Hemostasis was achieved with the electrocautery.  Skin flaps were elevated cephalad and caudad from the thyroid notch to the sternal notch.  The Mahorner self-retaining retractor was placed for exposure.  Strap muscles were incised in the midline and dissection was begun on the left side.  Strap  muscles were reflected laterally.  Left thyroid lobe was firm but without nodules.  The left lobe was gently mobilized with blunt dissection.  Superior pole vessels were dissected out and divided individually between small and medium Ligaclips with the Harmonic scalpel.  The thyroid lobe was rolled anteriorly.  Branches of the inferior thyroid artery were divided between small Ligaclips with the Harmonic scalpel.  Inferior venous tributaries were divided between Ligaclips.  Both the superior and inferior parathyroid glands were identified and preserved on their vascular pedicles.  The recurrent laryngeal nerve was identified and preserved along its course.  The ligament of Allyson SabalBerry was released with the electrocautery and the gland was mobilized onto the anterior trachea. Isthmus was transected at the midline.  There was no pyramidal lobe present.  Left lobe was submitted to pathology. Dry pack was placed in the left neck.  The right thyroid lobe was gently mobilized with blunt dissection.  Right thyroid lobe was mildly enlarged, firm with a solid nodule in the inferior pole.  Superior pole vessels were dissected out and divided between small and medium Ligaclips with the Harmonic scalpel.  Superior parathyroid was identified and preserved.  Inferior venous tributaries were divided between medium Ligaclips with the Harmonic scalpel.  The right thyroid lobe was rolled anteriorly and the branches of the inferior thyroid artery divided between small Ligaclips.  The right recurrent laryngeal nerve was identified and preserved along its course.  The ligament of Allyson SabalBerry was released with the electrocautery.  The right thyroid lobe was mobilized onto the anterior trachea and the remainder of the thyroid was dissected off the anterior trachea and the thyroid was completely excised.  A suture was used to mark the superior pole of the right lobe. The right thyroid lobe was submitted to pathology for review.  The neck was  irrigated with warm saline.  Fibrillar was placed throughout the operative field.  Strap muscles were reapproximated in the midline with interrupted 3-0 Vicryl sutures.  Platysma was closed with interrupted 3-0 Vicryl sutures.  Skin was closed with a running 4-0 Monocryl subcuticular suture.  Wound was washed and dried and benzoin and steri-strips were applied.  Dry gauze dressing was placed.  The patient was awakened from anesthesia and brought to the recovery room.  The patient tolerated the procedure well.   Earnstine Regal, MD, [facility] Office: [phone]

## 2016-01-09 NOTE — Interval H&P Note (Signed)
History and Physical Interval Note:  01/09/2016 10:09 AM  Breanna Hernandez  has presented today for surgery, with the diagnosis of Papillary thyroid carcinoma.  The various methods of treatment have been discussed with the patient and family. After consideration of risks, benefits and other options for treatment, the patient has consented to    Procedure(s): RIGHT THYROID LOBECTOMY (Right) as a surgical intervention .    The patient's history has been reviewed, patient examined, no change in status, stable for surgery.  I have reviewed the patient's chart and labs.  Questions were answered to the patient's satisfaction.    Earnstine Regal, MD, [facility] Office: [place]

## 2016-01-10 ENCOUNTER — Encounter (HOSPITAL_COMMUNITY): Payer: Self-pay | Admitting: General Practice

## 2016-01-10 DIAGNOSIS — C73 Malignant neoplasm of thyroid gland: Secondary | ICD-10-CM | POA: Diagnosis not present

## 2016-01-10 LAB — BASIC METABOLIC PANEL
Anion gap: 7 (ref 5–15)
BUN: 13 mg/dL (ref 6–20)
CALCIUM: 8.1 mg/dL — AB (ref 8.9–10.3)
CO2: 27 mmol/L (ref 22–32)
Chloride: 101 mmol/L (ref 101–111)
Creatinine, Ser: 0.92 mg/dL (ref 0.44–1.00)
GFR calc Af Amer: >60 mL/min (ref >60)
GLUCOSE: 110 mg/dL — AB (ref 65–99)
Potassium: 3.5 mmol/L (ref 3.5–5.1)
Sodium: 135 mmol/L (ref 135–145)

## 2016-01-10 MED ORDER — CALCIUM CARBONATE ANTACID 500 MG PO CHEW: 2.0000 | CHEWABLE_TABLET | Freq: Three times a day (TID) | ORAL | 1 refills | Status: DC

## 2016-01-10 MED ORDER — SODIUM CHLORIDE 0.9 % IV SOLN
2.0000 g | INTRAVENOUS | Status: AC
  Administered 2016-01-10: 2 g via INTRAVENOUS
  Filled 2016-01-10: qty 20

## 2016-01-10 MED ORDER — HYDROCODONE-ACETAMINOPHEN 5-325 MG PO TABS: 1.0000 | ORAL_TABLET | ORAL | 0 refills | Status: DC | PRN

## 2016-01-10 NOTE — Care Management Obs Status (Signed)
[facility] NOTIFICATION   Patient Details  Name: Myong Ravan MRN: [id number] Date of Birth: [birth year]   Medicare Observation Status Notification Given:  Yes    Marilu Favre, RN 01/10/2016, 11:28 AM

## 2016-01-10 NOTE — Progress Notes (Signed)
Anterior neck incision w/ steri strips dry and intact, sterile gauze applied. Pain is controlled w/ oral narcotics. Pt is tolerating soft diet. Discharge instructions given to pt, verbalized understanding. Discharged home accompanied by spouse.

## 2016-01-10 NOTE — Discharge Summary (Signed)
Physician Discharge Summary [facility]  Patient ID: Breanna Hernandez MRN: [id number] DOB/AGE: 1945-09-08 70 y.o.  Admit date: 01/09/2016 Discharge date: 01/10/2016  Admission Diagnoses:  Papillary thyroid carcinoma  Discharge Diagnoses:  Principal Problem:   Papillary thyroid carcinoma [facility])   Discharged Condition: good  Hospital Course: Patient was admitted for observation following thyroid surgery.  Post op course was uncomplicated.  Pain was well controlled.  Tolerated diet.  Post op calcium level on morning following surgery was 8.1 mg/dl.  Patient was administered calcium gluconate 2 gm IV prior to discharge home.  Patient was prepared for discharge home on POD#1.  Consults: None  Treatments: surgery: total thyroidectomy  Discharge Exam: Blood pressure (!) 104/42, pulse 64, temperature 98.6 F (37 C), temperature source Oral, resp. rate 16, height 5\' 4"  (1.626 m), weight 108.9 kg (240 lb 1.3 oz), SpO2 97 %. HEENT - clear Neck - wound dry and intact; mild STS; voice mildly hoarse Chest - clear bilaterally Cor - RRR  Disposition: Home  Discharge Instructions    Apply dressing    Complete by:  As directed    Apply light gauze dressing to wound before discharge home today.   Diet - low sodium heart healthy    Complete by:  As directed    Discharge instructions    Complete by:  As directed    [facility]  THYROID & PARATHYROID SURGERY:  POST-OP INSTRUCTIONS  Always review your discharge instruction sheet from the facility where your surgery was performed.  A prescription for pain medication may be given to you upon discharge.  Take your pain medication as prescribed.  If narcotic pain medicine is not needed, then you may take acetaminophen (Tylenol) or ibuprofen (Advil) as needed.  Take your usually prescribed medications unless otherwise directed.  If you need a refill on your pain medication, please contact your pharmacy. They  will contact our office to request authorization.  Prescriptions will not be processed by our office after 5 pm or on weekends.  Start with a light diet upon arrival home, such as soup and crackers or toast.  Be sure to drink plenty of fluids daily.  Resume your normal diet the day after surgery.  Most patients will experience some swelling and bruising on the chest and neck area.  Ice packs will help.  Swelling and bruising can take several days to resolve.   It is common to experience some constipation after surgery.  Increasing fluid intake and taking a stool softener will usually help or prevent this problem.  A mild laxative (Milk of Magnesia or Miralax) should be taken according to package directions if there has been no bowel movement after 48 hours.  You have steri-strips and a gauze dressing over your incision.  You may remove the gauze bandage on the second day after surgery, and you may shower at that time.  Leave your steri-strips (small skin tapes) in place directly over the incision.  These strips should remain on the skin for 5-7 days and then be removed.  You may get them wet in the shower and pat them dry.  You may resume regular (light) daily activities beginning the next day - such as daily self-care, walking, climbing stairs - gradually increasing activities as tolerated.  You may have sexual intercourse when it is comfortable.  Refrain from any heavy lifting or straining until approved by your doctor.  You may drive when you no longer are taking prescription pain  medication, you can comfortably wear a seatbelt, and you can safely maneuver your car and apply brakes.  You should see your doctor in the office for a follow-up appointment approximately two to three weeks after your surgery.  Make sure that you call for this appointment within a day or two after you arrive home to insure a convenient appointment time.  WHEN TO CALL YOUR DOCTOR: -- Fever greater than 101.5 -- Inability  to urinate -- Nausea and/or vomiting - persistent -- Extreme swelling or bruising -- Continued bleeding from incision -- Increased pain, redness, or drainage from the incision -- Difficulty swallowing or breathing -- Muscle cramping or spasms -- Numbness or tingling in hands or around lips  The clinic staff is available to answer your questions during regular business hours.  Please don't hesitate to call and ask to speak to one of the nurses if you have concerns.  Earnstine Regal, MD, [facility] Office: [phone]  Website: [email or web address]   Increase activity slowly    Complete by:  As directed    Remove dressing in 24 hours    Complete by:  As directed        Medication List    TAKE these medications   acetaminophen 500 MG tablet Commonly known as:  TYLENOL Take 1,000 mg by mouth every 6 (six) hours as needed for moderate pain or headache.   aspirin 81 MG tablet Take 81 mg by mouth every morning.   benazepril 40 MG tablet Commonly known as:  LOTENSIN Take 1 tablet (40 mg total) by mouth daily. KEEP OV. What changed:  additional instructions   calcium carbonate 500 MG chewable tablet Commonly known as:  TUMS Chew 2 tablets (400 mg of elemental calcium total) by mouth 3 (three) times daily.   CALCIUM-MAGNESIUM-VITAMIN D PO Take 1 tablet by mouth 2 (two) times daily.   fish oil-omega-3 fatty acids 1000 MG capsule Take 1 g by mouth 2 (two) times daily.   hydrochlorothiazide 12.5 MG capsule Commonly known as:  MICROZIDE TAKE 2 CAPSULES BY MOUTH EVERY DAY   HYDROcodone-acetaminophen 5-325 MG tablet Commonly known as:  NORCO/VICODIN Take 1-2 tablets by mouth every 4 (four) hours as needed for moderate pain.   hydrocortisone 2.5 % cream Apply 1 application topically daily as needed (groin irritation).   ibuprofen 200 MG tablet Commonly known as:  ADVIL,MOTRIN Take 400 mg by mouth every 6 (six)  hours as needed for headache or moderate pain.   ketoconazole 2 % cream Commonly known as:  NIZORAL Apply 1 application topically daily as needed for irritation (groin).   levothyroxine 75 MCG tablet Commonly known as:  SYNTHROID, LEVOTHROID TAKE 1 TABLET BY MOUTH DAILY BEFORE BREAKFAST   LORazepam 1 MG tablet Commonly known as:  ATIVAN TAKE 1 TABLET BY MOUTH AS NEEDED( NOT ON A REGULAR BASIS)   metoprolol 50 MG tablet Commonly known as:  LOPRESSOR Take 1 tablet (50 mg total) by mouth 2 (two) times daily.   OVER THE COUNTER MEDICATION Take 1 tablet by mouth every morning. Product Name: EHT Mind enhancement formula Vitamin d 200 units Vitamin b 1.6 mg Vitamin b-12 10 mcg Selinum 70 mcg Coffee Extract 35 mg Alpha leporic acid 50 mg huperzine-a 50 mcg   OVER THE COUNTER MEDICATION Take 1 tablet by mouth daily. Vitamin B12, Vitamin B1 and Alpha Leporic Acid   PROBIOTIC PO Take 1 capsule by mouth 2 (two) times daily.   zoster  vaccine live (PF) 19400 UNT/0.65ML injection Commonly known as:  ZOSTAVAX Inject 19,400 Units into the skin once.        Earnstine Regal, MD, [facility] Office: [phone]   Signed: Earnstine Regal 01/10/2016, 8:02 AM

## 2016-01-17 ENCOUNTER — Telehealth: Payer: Self-pay | Admitting: *Deleted

## 2016-01-17 NOTE — Telephone Encounter (Signed)
-----   Message from Heath Lark, MD sent at 01/17/2016  7:57 AM EDT ----- Regarding: referral to genetics Pls call patient. I have reviewed recent surgery. Typically patient gets referred to endocrinologist after surgery. No need to see me unless she wants to. I recommend genetic counselling. See if patient is interested.

## 2016-01-18 NOTE — Telephone Encounter (Signed)
S/w Roma Kayser, Genetic Counselor.  States pt has had genetic testing and counseling already.  There is nothing new they would add in light of recent Thyroid cancer diagnosis.   Called pt and left VM to let her know Dr. Alvy Bimler has reviewed her recent surgery.  Asked her to call nurse back to discuss Dr. Calton Dach message.

## 2016-01-25 ENCOUNTER — Telehealth: Payer: Self-pay | Admitting: *Deleted

## 2016-01-25 NOTE — Telephone Encounter (Signed)
Pt was returning nurse's call from 10/10.   Informed her of Dr. Calton Dach message.  She wanted to make sure she is following w/ Endocrinologist after surgery.  She does not need to see Dr. Alvy Bimler again unless she wants to.   Pt states she is seeing Endocrinologist and no need to see Dr. Alvy Bimler.

## 2016-01-31 ENCOUNTER — Other Ambulatory Visit: Payer: Self-pay | Admitting: Cardiovascular Disease

## 2016-01-31 ENCOUNTER — Telehealth: Payer: Self-pay | Admitting: Cardiovascular Disease

## 2016-01-31 NOTE — Telephone Encounter (Signed)
Pt is calling because Dr.Croitoru upped her dosage of her Metroprol to two pills a day one in the morning one at night instead  Of 1.5. She needs a new RX sent to [address] to reflect the new dosage.

## 2016-01-31 NOTE — Telephone Encounter (Signed)
Rx(s) sent to pharmacy electronically.  

## 2016-02-10 ENCOUNTER — Telehealth: Payer: Self-pay | Admitting: Family Medicine

## 2016-02-10 NOTE — Telephone Encounter (Signed)
Patient needs CPE last visit 09/20/14 Thanks PC

## 2016-02-16 ENCOUNTER — Other Ambulatory Visit: Payer: Self-pay | Admitting: Internal Medicine

## 2016-02-16 ENCOUNTER — Other Ambulatory Visit: Payer: Self-pay | Admitting: Cardiovascular Disease

## 2016-02-16 ENCOUNTER — Encounter: Payer: Self-pay | Admitting: Internal Medicine

## 2016-02-16 ENCOUNTER — Ambulatory Visit (INDEPENDENT_AMBULATORY_CARE_PROVIDER_SITE_OTHER): Payer: Medicare Other | Admitting: Internal Medicine

## 2016-02-16 VITALS — BP 134/82 | HR 62 | Ht 64.0 in | Wt 248.0 lb

## 2016-02-16 DIAGNOSIS — C73 Malignant neoplasm of thyroid gland: Secondary | ICD-10-CM | POA: Diagnosis not present

## 2016-02-16 DIAGNOSIS — E890 Postprocedural hypothyroidism: Secondary | ICD-10-CM | POA: Diagnosis not present

## 2016-02-16 DIAGNOSIS — I2510 Atherosclerotic heart disease of native coronary artery without angina pectoris: Secondary | ICD-10-CM | POA: Diagnosis not present

## 2016-02-16 LAB — T4, FREE: Free T4: 0.47 ng/dL — ABNORMAL LOW (ref 0.60–1.60)

## 2016-02-16 LAB — TSH: TSH: 37.51 u[IU]/mL — ABNORMAL HIGH (ref 0.35–4.50)

## 2016-02-16 LAB — CALCIUM: CALCIUM: 10.2 mg/dL (ref 8.4–10.5)

## 2016-02-16 MED ORDER — LEVOTHYROXINE SODIUM 125 MCG PO TABS: 125.0000 ug | ORAL_TABLET | Freq: Every day | ORAL | 1 refills | Status: DC

## 2016-02-16 NOTE — Patient Instructions (Addendum)
Please increase levothyroxine to 125 mcg daily.  Take the thyroid hormone every day, with water, at least 30 minutes before breakfast, separated by at least 4 hours from: - acid reflux medications - calcium - iron - multivitamins  Please come back for labs 5-6 weeks from now.  We will do the RAI treatment afterwards.  Please come back for a follow-up appointment in 3 months.

## 2016-02-16 NOTE — Progress Notes (Addendum)
Patient ID: Breanna Hernandez, female   DOB: 1945/11/21, 71 y.o.   MRN: [id number]    HPI  Breanna Hernandez is a 70 y.o.-year-old female, referred by Dr. Harlow Asa, for management of thyroid cancer and postsurgical hypothyroidism.  Pt has a h/o Ao dissection in 2012 >> has CT scans q 2 years >> thyroid nodule incidentally found on last CT scan.  Patient had a thyroid ultrasound on 10/21/2015 that showed a right 1.5 x 1.4 x 1.4 cm nodule, solid, with punctate calcifications.  Pt. Had an FNA of this nodule in 11/2015: PTC.  Patient had total thyroidectomy by Dr. Harlow Asa on 01/09/2016. 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 Diagnosis 1. Thyroid, lobectomy, Right - PAPILLARY THYROID CARCINOMA, 1.5 CM. - LYMPHOVASCULAR INVASION IS IDENTIFIED. - CARCINOMA IS CONFINED TO THE THYROID. - THE SURGICAL RESECTION MARGINS ARE NEGATIVE FOR CARCINOMA. - TWO BENIGN LYMPH NODES (0/2). - SEE ONCOLOGY TABLE BELOW. 2. Thyroid, lobectomy, Left - LYMPHOCYTIC THYROIDITIS. - THERE IS NO EVIDENCE OF MALIGNANCY. Microscopic Comment 1. THYROID Specimen: Bilateral thyroid lobes Procedure (including lymph node sampling if applicable): Bilateral hemi-thyroidectomy Specimen Integrity (intact/fragmented): Intact Tumor focality: Unifocal Dominant tumor: Maximum tumor size (cm): 1.5 cm (gross measurement). 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 Capsular invasion with degree of invasion if present: N/A Margins: Negative for carcinoma Lymphatic or vascular invasion: Present, focal Lymph nodes: # examined 2; # positive; 0 Extracapsular extension (if applicable): Not identified. TNM code: pT1b , pN0 Non-neoplastic thyroid: Lymphocytic thyroiditis.  Breanna Hernandez Breanna Hernandez Pathologist, Electronic  Signature (Case signed 01/10/2016)  For her postsurgical hypothyroidism, she is on Levothyroxine 75 mcg, taken: - fasting - with water - separated by >30 min from b'fast  - no iron, PPIs, multivitamins  - + calcium!!!  She has a h/o hypothyroidism for >30 years >> was on a stable dose of 75 mcg LT4 before the Sx, now continues on this dose.  I reviewed pt's thyroid tests: Lab Results  Component Value Date   TSH 3.10 09/29/2015   TSH 3.11 08/17/2014   TSH 1.72 09/22/2013   TSH 3.70 09/20/2010   TSH 8.161 (H) 05/16/2010   TSH 7.891 (H) 05/11/2010   TSH 4.18 05/27/2009   TSH 3.75 05/16/2007   TSH 4.69 11/15/2006   TSH 6.41 (H) 08/09/2006   FREET4 0.99 09/20/2010    Pt describes: - no fatigue - no weight gain/loss - no heat/cold intolerance - + both constipation and diarrhea - h/o IBS - no dry skin - + hair loss - no anxiety/depression  She has + FH of thyroid disorders in: mother (thyroid nodules). No FH of thyroid cancer.  No h/o radiation tx to head or neck, but had RxTx for BrCA - not as well localized in the R breast.  ROS: Constitutional: no weight gain/loss, no fatigue, no subjective hyperthermia/hypothermia, + nocturia Eyes: no blurry vision, no xerophthalmia ENT: no sore throat, no nodules palpated in throat, no dysphagia/odynophagia, no hoarseness Cardiovascular: no CP/SOB/+ palpitations/+ leg swelling Respiratory: no cough/SOB Gastrointestinal: no N/V/+ D/+ C Musculoskeletal: + muscle aches/joint aches Skin: no rashes, + hair loss Neurological: no tremors/numbness/tingling/dizziness Psychiatric: no depression/anxiety  Past Medical History:  Diagnosis Date  . Arthritis   . Atrial fibrillation ([place])    h/o post-op 04/2010  . Breast cancer ([place])    x 3 cancer - radiation right x2 ''  00,'05, left '07 mammosite radiation   . Diverticulosis   . 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  . Fatty liver 01/10/07  . GERD  (gastroesophageal reflux disease)   . Headache    migraines   . 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 HTN (hypertension)   . Hyperlipidemia   . Hyperplastic colon polyp   . Hypothyroidism   . IBS (irritable bowel syndrome)   . Lower extremity neuropathy    occ. affects walking and balance"foot to knees"  . Obesity   . Renal cyst, left   . Thyroid cancer [facility])    Past Surgical History:  Procedure Laterality Date  . BREAST LUMPECTOMY  2000   Right x2, Left x1  . CHOLECYSTECTOMY    . COLONOSCOPY W/ POLYPECTOMY    . CORONARY ARTERY BYPASS GRAFT  05/03/2010   CABG X1, RCA -Dr Breanna Hernandez (with type 1 aortic dissection)  . HYSTEROSCOPY W/D&C N/A 04/13/2014   Procedure: DILATATION AND CURETTAGE /HYSTEROSCOPY Polypectomy;  Surgeon: Breanna Sarah. Landry Mellow, Breanna Hernandez;  Location: [place] [facility];  Service: Gynecology;  Laterality: N/A;  Possible Polypectomy  . Median sternotomy, extracorporeal circulation, repair of type 1 aortic dissection with  tube graft from  sinotubular junction to take off of innominate artery, distal anastomoses under deep bypothermic circulatory arrest  05/03/2010   Dr Breanna Hernandez  . ROBOTIC ASSISTED TOTAL HYSTERECTOMY WITH BILATERAL SALPINGO OOPHERECTOMY Bilateral 05/18/2014   Procedure: ROBOTIC ASSISTED TOTAL HYSTERECTOMY WITH BILATERAL SALPINGO OOPHORECTOMY SENTINAL LYMPH NODE MAPPING ;  Surgeon: Breanna Amber, Breanna Hernandez;  Location: [facility];  Service: Gynecology;  Laterality: Bilateral;  . THYROIDECTOMY N/A 01/09/2016   Procedure: TOTAL THYROIDECTOMY;  Surgeon: Breanna Gemma, Breanna Hernandez;  Location: [place];  Service: General;  Laterality: N/A;  . TONSILLECTOMY    . TOTAL THYROIDECTOMY  01/09/2016   Social History   Social History  . Marital status: Married    Spouse name: N/A  . Number of children: 6   Occupational History  . retired   Social History Main Topics  . Smoking status: Never Smoker  . Smokeless tobacco: Never Used  . Alcohol use No  . Drug use: No   Current Outpatient  Prescriptions on File Prior to Visit  Medication Sig Dispense Refill  . acetaminophen (TYLENOL) 500 MG tablet Take 1,000 mg by mouth every 6 (six) hours as needed for moderate pain or headache.    Marland Kitchen amLODipine (NORVASC) 5 MG tablet TAKE 1 TABLET(5 MG) BY MOUTH DAILY 30 tablet 10  . aspirin 81 MG tablet Take 81 mg by mouth every morning.     . benazepril (LOTENSIN) 40 MG tablet Take 1 tablet (40 mg total) by mouth daily. KEEP OV. (Patient taking differently: Take 40 mg by mouth daily. ) 90 tablet 0  . calcium carbonate (TUMS) 500 MG chewable tablet Chew 2 tablets (400 mg of elemental calcium total) by mouth 3 (three) times daily. 90 tablet 1  . CALCIUM-MAGNESIUM-VITAMIN D PO Take 1 tablet by mouth 2 (two) times daily.     . fish oil-omega-3 fatty acids 1000 MG capsule Take 1 g by mouth 2 (two) times daily.     . hydrochlorothiazide (MICROZIDE) 12.5 MG capsule TAKE 2 CAPSULES BY MOUTH EVERY DAY 180 capsule 1  . HYDROcodone-acetaminophen (NORCO/VICODIN) 5-325 MG tablet Take 1-2 tablets by mouth every 4 (four) hours as needed for moderate pain. 20 tablet 0  . hydrocortisone 2.5 % cream Apply 1 application topically  daily as needed (groin irritation).   0  . ibuprofen (ADVIL,MOTRIN) 200 MG tablet Take 400 mg by mouth every 6 (six) hours as needed for headache or moderate pain.    Marland Kitchen ketoconazole (NIZORAL) 2 % cream Apply 1 application topically daily as needed for irritation (groin).   2  . LORazepam (ATIVAN) 1 MG tablet TAKE 1 TABLET BY MOUTH AS NEEDED( NOT ON A REGULAR BASIS) 30 tablet 0  . metoprolol (LOPRESSOR) 50 MG tablet Take 1 tablet (50 mg total) by mouth 2 (two) times daily. 180 tablet 3  . OVER THE COUNTER MEDICATION Take 1 tablet by mouth every morning. Product Name: EHT Mind enhancement formula Vitamin d 200 units Vitamin b 1.6 mg Vitamin b-12 10 mcg Selinum 70 mcg Coffee Extract 35 mg Alpha leporic acid 50 mg huperzine-a 50 mcg    . OVER THE COUNTER MEDICATION Take 1 tablet by mouth  daily. Vitamin B12, Vitamin B1 and Alpha Leporic Acid    . Probiotic Product (PROBIOTIC PO) Take 1 capsule by mouth 2 (two) times daily.    Marland Kitchen zoster vaccine live, PF, (ZOSTAVAX) 81157 UNT/0.65ML injection Inject 19,400 Units into the skin once. (Patient not taking: Reported on 01/04/2016) 1 each 0  . [DISCONTINUED] amLODipine-benazepril (LOTREL) 5-20 MG per capsule Take 1 capsule by mouth daily.      . [DISCONTINUED] escitalopram (LEXAPRO) 10 MG tablet Take 10 mg by mouth daily.      . [DISCONTINUED] lisinopril (PRINIVIL,ZESTRIL) 20 MG tablet Take 20 mg by mouth daily.       No current facility-administered medications on file prior to visit.    Allergies  Allergen Reactions  . No Known Allergies    Family History  Problem Relation Age of Onset  . Aneurysm Father     CNS  . Heart attack Father     x2  . Alzheimer's disease Mother   . Hypertension Mother   . Goiter Mother   . Hypertension Brother   . Kidney disease Brother   . Colon cancer Neg Hx    PE: BP 134/82   Pulse 62   Ht 5' 4"  (1.626 m)   Wt 248 lb (112.5 kg)   BMI 42.57 kg/m  Wt Readings from Last 3 Encounters:  02/16/16 248 lb (112.5 kg)  01/09/16 240 lb 1.3 oz (108.9 kg)  11/16/15 240 lb (108.9 kg)   Constitutional: overweight, in NAD Eyes: PERRLA, EOMI, no exophthalmos ENT: moist mucous membranes, thyroidectomy scar healing, still slightly erythematous, but w/o swelling, w/o keloid; no cervical lymphadenopathy Cardiovascular: RRR, No MRG Respiratory: CTA B Gastrointestinal: abdomen soft, NT, ND, BS+ Musculoskeletal: no deformities, strength intact in all 4 Skin: moist, warm, no rashes Neurological: no tremor with outstretched hands, DTR normal in all 4  ASSESSMENT: 1. Thyroid cancer - see HPI  2. Postsurgical Hypothyroidism  PLAN:  1. Thyroid cancer - papillary - I had a long discussion with the patient about her recent diagnosis of thyroid cancer. We reviewed together the pathology >> she is stage 1  TNM  - I reassured her that papillary thyroid cancer is a slow growing cancer with good prognosis; her life expectancy is unlikely to be reduced due to the cancer.  - the tumor was > 1.5 cm, however, it had lympho-vascular invasion and it also contained Hurthle cell features >> I recommended radioactive iodine for post-op thyroid remnant ablation. I explained that the main role of this is to facilitate monitoring in the long run (by checking thyroglobulin).  I explaind what this entails. Pt agrees to have this scheduled. - We will proceed with Thyrogen-stimulated RAI tx, rather than thyroid hormone withdrawal - we will check a thyroglobulin and Tg antibodies at next visit - We will need another thyroid ultrasound a year from her surgery - I will see the patient back in 3 months  2. Patient with h/o total thyroidectomy for cancer, now with iatrogenic hypothyroidism, on levothyroxine therapy. She has been on 75 g of levothyroxine for a long time after being diagnosed with hypothyroidism. However, we discussed that after her surgery, she will need an increase in dose. I will increase the dose to 125 g daily today, and we'll need to adjust this after next check. - We discussed about correct intake of levothyroxine, fasting, with water, separated by at least 30 minutes from breakfast, and separated by more than 4 hours from calcium, iron, multivitamins, acid reflux medications (PPIs). She is taking the levothyroxine along with calcium. I advised her to move calcium with lunch or later.  - will check thyroid tests in 5-6 weeks after the above changes: TSH, free T4 - target TSH: Normal range, not lower - If these are normal, we will recheck them at next visit  CC: Dr Sallyanne Kuster  Addendum: Patient accidentally stopped at the lab and her results are shown below: Office Visit on 02/16/2016  Component Date Value Ref Range Status  . Free T4 02/16/2016 0.47* 0.60 - 1.60 ng/dL Final   Comment: Specimens from  patients who are undergoing biotin therapy and /or ingesting biotin supplements may contain high levels of biotin.  The higher biotin concentration in these specimens interferes with this Free T4 assay.  Specimens that contain high levels  of biotin may cause false high results for this Free T4 assay.  Please interpret results in light of the total clinical presentation of the patient.    Marland Kitchen TSH 02/16/2016 37.51* 0.35 - 4.50 uIU/mL Final  . Calcium 02/16/2016 10.2  8.4 - 10.5 mg/dL Final   Msg sent: Dear Breanna Hernandez, The labs returned as expected, with a very high TSH and low free T4. Let's go ahead and increase the levothyroxine as we discussed, and also take it separately from calcium. Your calcium level is actually normal, so we can decrease the dose of calcium to just once a day. Please do not forget to come back for labs in 1.5 months. Sincerely, Breanna Kingdom Breanna Hernandez  Breanna Kingdom, Breanna Hernandez [facility] Endocrinology

## 2016-02-17 NOTE — Addendum Note (Signed)
Addended by: Philemon Kingdom on: 02/17/2016 03:32 PM   Modules accepted: Orders

## 2016-02-20 NOTE — Progress Notes (Signed)
[employer]

## 2016-02-22 NOTE — Telephone Encounter (Signed)
Called pt. She says that she has had her thyroid removed completely. She says that she is now seeing Dr. Cruzita Lederer. She would like to know if it is okay to have her flu and pneumonia shot before her first treatment?    Please advise.    ALSO, pt says that she will schedule her cpe at a later date.

## 2016-03-09 ENCOUNTER — Telehealth: Payer: Self-pay | Admitting: Internal Medicine

## 2016-03-09 NOTE — Telephone Encounter (Signed)
Pt called and requested to speak with Dr. Cruzita Lederer before she has the RAI Treatment.

## 2016-03-13 ENCOUNTER — Other Ambulatory Visit: Payer: Self-pay | Admitting: Cardiovascular Disease

## 2016-03-13 NOTE — Telephone Encounter (Signed)
Rx has been sent to the pharmacy electronically. ° °

## 2016-03-14 ENCOUNTER — Encounter (HOSPITAL_COMMUNITY)
Admission: RE | Admit: 2016-03-14 | Discharge: 2016-03-14 | Disposition: A | Discharge status: Home / Self Care | Payer: Medicare Other | Source: Ambulatory Visit | Attending: Internal Medicine | Admitting: Internal Medicine

## 2016-03-14 DIAGNOSIS — C73 Malignant neoplasm of thyroid gland: Secondary | ICD-10-CM | POA: Diagnosis present

## 2016-03-14 MED ORDER — THYROTROPIN ALFA 1.1 MG IM SOLR
0.9000 mg | INTRAMUSCULAR | Status: AC
  Administered 2016-03-14: 0.9 mg via INTRAMUSCULAR

## 2016-03-14 MED ORDER — THYROTROPIN ALFA 1.1 MG IM SOLR
INTRAMUSCULAR | Status: AC
Start: 2016-03-14 — End: 2016-03-14
  Filled 2016-03-14: qty 0.9

## 2016-03-15 ENCOUNTER — Encounter (HOSPITAL_COMMUNITY)
Admission: RE | Admit: 2016-03-15 | Discharge: 2016-03-15 | Disposition: A | Discharge status: Home / Self Care | Payer: Medicare Other | Source: Ambulatory Visit | Attending: Internal Medicine | Admitting: Internal Medicine

## 2016-03-15 DIAGNOSIS — C73 Malignant neoplasm of thyroid gland: Secondary | ICD-10-CM | POA: Diagnosis not present

## 2016-03-15 MED ORDER — THYROTROPIN ALFA 1.1 MG IM SOLR
0.9000 mg | INTRAMUSCULAR | Status: AC
  Administered 2016-03-15: 0.9 mg via INTRAMUSCULAR

## 2016-03-15 MED ORDER — THYROTROPIN ALFA 1.1 MG IM SOLR
INTRAMUSCULAR | Status: AC
  Filled 2016-03-15: qty 0.9

## 2016-03-15 NOTE — Telephone Encounter (Signed)
Called and discussed with pt.

## 2016-03-16 ENCOUNTER — Encounter (HOSPITAL_COMMUNITY)
Admission: RE | Admit: 2016-03-16 | Discharge: 2016-03-16 | Disposition: A | Discharge status: Home / Self Care | Payer: Medicare Other | Source: Ambulatory Visit | Attending: Internal Medicine | Admitting: Internal Medicine

## 2016-03-16 DIAGNOSIS — C73 Malignant neoplasm of thyroid gland: Secondary | ICD-10-CM | POA: Diagnosis not present

## 2016-03-16 MED ORDER — SODIUM IODIDE I 131 CAPSULE
46.7000 | Freq: Once | INTRAVENOUS | Status: AC | PRN
  Administered 2016-03-16: 46.7 via ORAL

## 2016-03-22 ENCOUNTER — Other Ambulatory Visit (INDEPENDENT_AMBULATORY_CARE_PROVIDER_SITE_OTHER): Payer: Medicare Other

## 2016-03-22 DIAGNOSIS — E890 Postprocedural hypothyroidism: Secondary | ICD-10-CM

## 2016-03-22 LAB — TSH: TSH: 21.2 u[IU]/mL — AB (ref 0.35–4.50)

## 2016-03-22 LAB — T4, FREE: FREE T4: 0.87 ng/dL (ref 0.60–1.60)

## 2016-03-26 ENCOUNTER — Encounter (HOSPITAL_COMMUNITY)
Admission: RE | Admit: 2016-03-26 | Discharge: 2016-03-26 | Disposition: A | Discharge status: Home / Self Care | Payer: Medicare Other | Source: Ambulatory Visit | Attending: Internal Medicine | Admitting: Internal Medicine

## 2016-03-26 DIAGNOSIS — C73 Malignant neoplasm of thyroid gland: Secondary | ICD-10-CM | POA: Insufficient documentation

## 2016-03-28 ENCOUNTER — Telehealth: Payer: Self-pay

## 2016-03-28 ENCOUNTER — Other Ambulatory Visit: Payer: Self-pay | Admitting: Internal Medicine

## 2016-03-28 MED ORDER — LEVOTHYROXINE SODIUM 137 MCG PO TABS: 137.0000 ug | ORAL_TABLET | Freq: Every day | ORAL | 1 refills | Status: DC

## 2016-03-28 NOTE — Telephone Encounter (Signed)
Spoke to patient. Gave results. Patient states she has been taking med as directed. Has not missed any doses.  Sent Levothyroxine 137mg  to pharmacy on file.

## 2016-03-28 NOTE — Telephone Encounter (Signed)
-----   Message from Philemon Kingdom, MD sent at 03/26/2016  5:42 PM EST ----- Almyra Free, can you please release pt's labs in Mychart: TSH is still high despite increasing the dose of levothyroxine to 125 g daily. If she is taking it correctly, every day, we need to increase to 137 g daily. Can you please send this dose to the pharmacy and take out the other dose from her medication list? I will check her labs again when she comes at the beginning of February.

## 2016-05-08 ENCOUNTER — Telehealth: Payer: Self-pay | Admitting: *Deleted

## 2016-05-08 ENCOUNTER — Ambulatory Visit: Payer: Medicare Other | Admitting: Family Medicine

## 2016-05-08 NOTE — Telephone Encounter (Signed)
130 with Hoyle Sauer

## 2016-05-10 ENCOUNTER — Encounter: Payer: Self-pay | Admitting: Family Medicine

## 2016-05-10 ENCOUNTER — Ambulatory Visit (INDEPENDENT_AMBULATORY_CARE_PROVIDER_SITE_OTHER): Payer: Medicare Other | Admitting: Family Medicine

## 2016-05-10 VITALS — BP 128/74 | HR 57 | Temp 97.7°F | Resp 16 | Ht 64.0 in | Wt 250.4 lb

## 2016-05-10 DIAGNOSIS — D229 Melanocytic nevi, unspecified: Secondary | ICD-10-CM | POA: Insufficient documentation

## 2016-05-10 DIAGNOSIS — B001 Herpesviral vesicular dermatitis: Secondary | ICD-10-CM | POA: Diagnosis not present

## 2016-05-10 DIAGNOSIS — R269 Unspecified abnormalities of gait and mobility: Secondary | ICD-10-CM

## 2016-05-10 DIAGNOSIS — Z23 Encounter for immunization: Secondary | ICD-10-CM

## 2016-05-10 DIAGNOSIS — I10 Essential (primary) hypertension: Secondary | ICD-10-CM

## 2016-05-10 DIAGNOSIS — Z0000 Encounter for general adult medical examination without abnormal findings: Secondary | ICD-10-CM

## 2016-05-10 DIAGNOSIS — E039 Hypothyroidism, unspecified: Secondary | ICD-10-CM

## 2016-05-10 DIAGNOSIS — Z780 Asymptomatic menopausal state: Secondary | ICD-10-CM | POA: Diagnosis not present

## 2016-05-10 DIAGNOSIS — G629 Polyneuropathy, unspecified: Secondary | ICD-10-CM

## 2016-05-10 MED ORDER — ZOSTER VAC RECOMB ADJUVANTED 50 MCG/0.5ML IM SUSR: 50.0000 ug | Freq: Once | INTRAMUSCULAR | 1 refills | Status: AC

## 2016-05-10 MED ORDER — VALACYCLOVIR HCL 1 G PO TABS: 1000.0000 mg | ORAL_TABLET | Freq: Three times a day (TID) | ORAL | 0 refills | Status: DC

## 2016-05-10 NOTE — Progress Notes (Signed)
Subjective:   Breanna Hernandez is a 71 y.o. female who presents for an Initial Medicare Annual Wellness Visit.  Review of Systems    No ROS.  Medicare Wellness Visit.  The Patient was informed that the wellness visit is to identify future health risk and educate and initiate measures that can reduce risk for increased disease through the lifespan.   Describes health as fair, good or great? "Between fair and good."  Cardiac Risk Factors include: advanced age (>92men, >76 women);obesity (BMI >30kg/m2);sedentary lifestyle;hypertension  Sleep patterns: no sleep issues, feels rested on waking, gets up 0-2 times nightly to void and sleeps 7 hours nightly.  Home Safety/Smoke Alarms: Feels safe in home. Smoke alarms in place.  Living environment; residence and Firearm Safety: Lives in 3 level home. Lives w/ husband. number of outside stairs: 2, firearms stored safely. Seat Belt Safety/Bike Helmet: Wears seat belt.   Counseling:   Eye Exam- Dr. Herbert Deaner yearly. Dental- Dr. Pia Mau every 6 months  Female:  Pap- N/A, hysterectomy        Mammo- last 09/15/15. BI-RADS CATEGORY 1: Negative.       Dexa scan- Not on file. Pt reports has had in the past several years ago. She thinks results were normal.       CCS- last 02/01/10 w/ Dr. Delfin Edis. Mild diverticulosis, otherwise normal.      Objective:    Today's Vitals   05/10/16 1354 05/10/16 1357  BP: 128/74   Pulse: (!) 57   Resp: 16   Temp: 97.7 F (36.5 C)   TempSrc: Oral   SpO2: 97%   Weight: 250 lb 6.4 oz (113.6 kg)   Height: 5\' 4"  (1.626 m)   PainSc:  2    Body mass index is 42.98 kg/m.   Current Medications (verified) Outpatient Encounter Prescriptions as of 05/10/2016  Medication Sig  . acetaminophen (TYLENOL) 500 MG tablet Take 1,000 mg by mouth every 6 (six) hours as needed for moderate pain or headache.  Marland Kitchen aspirin 81 MG tablet Take 81 mg by mouth every morning.   . calcium carbonate (TUMS) 500 MG chewable tablet Chew 2  tablets (400 mg of elemental calcium total) by mouth 3 (three) times daily.  Marland Kitchen CALCIUM-MAGNESIUM-VITAMIN D PO Take 1 tablet by mouth 2 (two) times daily.   . fish oil-omega-3 fatty acids 1000 MG capsule Take 1 g by mouth 2 (two) times daily.   . hydrochlorothiazide (MICROZIDE) 12.5 MG capsule TAKE 2 CAPSULES BY MOUTH EVERY DAY  . hydrocortisone 2.5 % cream Apply 1 application topically daily as needed (groin irritation).   Marland Kitchen ibuprofen (ADVIL,MOTRIN) 200 MG tablet Take 400 mg by mouth every 6 (six) hours as needed for headache or moderate pain.  Marland Kitchen ketoconazole (NIZORAL) 2 % cream Apply 1 application topically daily as needed for irritation (groin).   Marland Kitchen levothyroxine (SYNTHROID, LEVOTHROID) 137 MCG tablet TAKE 1 TABLET(137 MCG) BY MOUTH DAILY BEFORE BREAKFAST  . LORazepam (ATIVAN) 1 MG tablet TAKE 1 TABLET BY MOUTH AS NEEDED( NOT ON A REGULAR BASIS)  . metoprolol (LOPRESSOR) 50 MG tablet Take 1 tablet (50 mg total) by mouth 2 (two) times daily.  Marland Kitchen OVER THE COUNTER MEDICATION Take 1 tablet by mouth every morning. Product Name: EHT Mind enhancement formula Vitamin d 200 units Vitamin b 1.6 mg Vitamin b-12 10 mcg Selinum 70 mcg Coffee Extract 35 mg Alpha leporic acid 50 mg huperzine-a 50 mcg  . OVER THE COUNTER MEDICATION Take 1 tablet by mouth daily.  Vitamin B12, Vitamin B1 and Alpha Leporic Acid  . Probiotic Product (PROBIOTIC PO) Take 1 capsule by mouth 2 (two) times daily.  Marland Kitchen amLODipine (NORVASC) 5 MG tablet TAKE 1 TABLET(5 MG) BY MOUTH DAILY (Patient not taking: Reported on 05/10/2016)  . benazepril (LOTENSIN) 40 MG tablet TAKE 1 TABLET BY MOUTH DAILY. KEEP OFFICE VISIT (Patient not taking: Reported on 05/10/2016)  . HYDROcodone-acetaminophen (NORCO/VICODIN) 5-325 MG tablet Take 1-2 tablets by mouth every 4 (four) hours as needed for moderate pain. (Patient not taking: Reported on 05/10/2016)  . [DISCONTINUED] zoster vaccine live, PF, (ZOSTAVAX) 16109 UNT/0.65ML injection Inject 19,400 Units into  the skin once. (Patient not taking: Reported on 05/10/2016)   No facility-administered encounter medications on file as of 05/10/2016.     Allergies (verified) Patient has no known allergies.   History: Past Medical History:  Diagnosis Date  . Arthritis   . Atrial fibrillation ([place])    h/o post-op 04/2010  . Breast cancer ([place])    x 3 cancer - radiation right x2 ''00,'05, left '07 mammosite radiation   . Diverticulosis   . 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  . Fatty liver 01/10/07  . GERD (gastroesophageal reflux disease)   . Headache    migraines   . 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 HTN (hypertension)   . Hyperlipidemia   . Hyperplastic colon polyp   . Hypothyroidism   . IBS (irritable bowel syndrome)   . Lower extremity neuropathy    occ. affects walking and balance"foot to knees"  . Obesity   . Renal cyst, left   . Thyroid cancer [facility])    Past Surgical History:  Procedure Laterality Date  . BREAST LUMPECTOMY  2000   Right x2, Left x1  . CHOLECYSTECTOMY    . COLONOSCOPY W/ POLYPECTOMY    . CORONARY ARTERY BYPASS GRAFT  05/03/2010   CABG X1, RCA -Dr Roxan Hockey (with type 1 aortic dissection)  . HYSTEROSCOPY W/D&C N/A 04/13/2014   Procedure: DILATATION AND CURETTAGE /HYSTEROSCOPY Polypectomy;  Surgeon: Maeola Sarah. Landry Mellow, MD;  Location: [facility];  Service: Gynecology;  Laterality: N/A;  Possible Polypectomy  . Median sternotomy, extracorporeal circulation, repair of type 1 aortic dissection with  tube graft from  sinotubular junction to take off of innominate artery, distal anastomoses under deep bypothermic circulatory arrest  05/03/2010   Dr Roxan Hockey  . ROBOTIC ASSISTED TOTAL HYSTERECTOMY WITH BILATERAL SALPINGO OOPHERECTOMY Bilateral 05/18/2014   Procedure: ROBOTIC ASSISTED TOTAL HYSTERECTOMY WITH BILATERAL SALPINGO OOPHORECTOMY SENTINAL LYMPH NODE MAPPING ;  Surgeon: Everitt Amber, MD;  Location: [facility];  Service: Gynecology;   Laterality: Bilateral;  . THYROIDECTOMY N/A 01/09/2016   Procedure: TOTAL THYROIDECTOMY;  Surgeon: Armandina Gemma, MD;  Location: [facility];  Service: General;  Laterality: N/A;  . TONSILLECTOMY    . TOTAL THYROIDECTOMY  01/09/2016   Family History  Problem Relation Age of Onset  . Aneurysm Father     CNS  . Heart attack Father     x2  . Alzheimer's disease Mother   . Hypertension Mother   . Goiter Mother   . Hypertension Brother   . Kidney disease Brother   . Colon cancer Neg Hx    Social History   Occupational History  . Not on file.   Social History Main Topics  . Smoking status: Never Smoker  . Smokeless tobacco: Never Used  . Alcohol use No  . Drug use:  No  . Sexual activity: Yes    Tobacco Counseling Counseling given: Not Answered   Activities of Daily Living In your present state of health, do you have any difficulty performing the following activities: 05/10/2016 01/09/2016  Hearing? N N  Vision? N N  Difficulty concentrating or making decisions? N N  Walking or climbing stairs? N N  Dressing or bathing? N N  Doing errands, shopping? N N  Preparing Food and eating ? N -  Using the Toilet? N -  In the past six months, have you accidently leaked urine? Y -  Do you have problems with loss of bowel control? N -  Managing your Medications? N -  Managing your Finances? N -  Housekeeping or managing your Housekeeping? N -  Some recent data might be hidden    Immunizations and Health Maintenance Immunization History  Administered Date(s) Administered  . Influenza [place] 04/17/2011   Health Maintenance Due  Topic Date Due  . DEXA SCAN  03/13/2009    Patient Care Team: Ann Held, DO as PCP - General (Family Medicine) Heath Lark, MD as Consulting Physician (Hematology and Oncology) Sanda Klein, MD as Consulting Physician (Cardiology) Melrose Nakayama, MD as Consulting Physician (Cardiothoracic Surgery) Christophe Louis, MD as Consulting Physician  (Obstetrics and Gynecology) Monna Fam, MD as Consulting Physician (Ophthalmology) Philemon Kingdom, MD as Consulting Physician (Internal Medicine)  Indicate any recent Medical Services you may have received from other than [facility] providers in the past year (date may be approximate).     Assessment:   This is a routine wellness examination for [place]. Physical assessment deferred to PCP.  Hearing/Vision screen Hearing Screening Comments: Able to hear conversational tones w/o difficulty. No issues reported. Passes whisper test. Vision Screening Comments: Wearing glasses. Follows w/ Dr. Monna Fam yearly. No issues reported.  Dietary issues and exercise activities discussed: Current Exercise Habits: The patient does not participate in regular exercise at present, Exercise limited by: orthopedic condition(s) (chronic pain)  Diet (meal preparation, eat out, water intake, caffeinated beverages, dairy products, fruits and vegetables): in general, an "unhealthy" diet, on average, 2 meals per day. Does not enjoy cooking. Eats out at least twice weekly. Rarely eats dessert. Trying to cut out fast food. Tries to drink more water than anything else. Drinks some decaf tea. Typical meal: something green, starch, and meat.       Goals    . Reduce fast food intake      Depression Screen PHQ 2/9 Scores 05/10/2016 08/17/2014  PHQ - 2 Score 2 1  PHQ- 9 Score 3 -    Fall Risk Fall Risk  05/10/2016 08/17/2014  Falls in the past year? Yes No  Number falls in past yr: 1 -  Injury with Fall? No -  Risk for fall due to : Impaired balance/gait -    Cognitive Function: MMSE - Mini Mental State Exam 05/10/2016  Orientation to time 5  Orientation to Place 5  Registration 3  Attention/ Calculation 5  Recall 3  Language- name 2 objects 2  Language- repeat 1  Language- follow 3 step command 3  Language- read & follow direction 1  Write a sentence 1  Copy design 1  Total score 30         Screening Tests Health Maintenance  Topic Date Due  . DEXA SCAN  03/13/2009  . INFLUENZA VACCINE  07/07/2016 (Originally 11/08/2015)  . ZOSTAVAX  05/10/2017 (Originally 03/13/2004)  . TETANUS/TDAP  05/10/2017 (Originally  03/14/1965)  . PNA vac Low Risk Adult (1 of 2 - PCV13) 05/10/2017 (Originally 03/13/2009)  . MAMMOGRAM  09/14/2017  . COLONOSCOPY  02/02/2020  . Hepatitis C Screening  Completed      Plan:   Follow-up w/ PCP as directed.  Continue doing brain stimulating activities (puzzles, reading, adult coloring books, staying active) to keep memory sharp.   Bring a copy of your advance directives to your next office visit.  Declines flu, PNA, and shingles-prefers more 'holistic' treatments/preventive measure over vaccines. Orders placed for bone density.  During the course of the visit, [place] was educated and counseled about the following appropriate screening and preventive services:   Vaccines to include Pneumoccal, Influenza, Hepatitis B, Td, Zostavax, HCV  Cardiovascular disease screening  Colorectal cancer screening  Bone density screening  Diabetes screening  Glaucoma screening  Mammography/PAP  Nutrition counseling  Patient Instructions (the written plan) were given to the patient.    Dorrene German, RN   05/10/2016

## 2016-05-10 NOTE — Progress Notes (Signed)
Patient ID: Breanna Hernandez, female    DOB: 1945-08-07  Age: 71 y.o. MRN: [id number]    Subjective:  Subjective  HPI Breanna Hernandez presents for a fib, htn and hypothyroid.  She has not been seen In over a year.  No new complaints.    Review of Systems  Constitutional: Negative for chills and fever.  HENT: Negative for congestion and hearing loss.   Eyes: Negative for discharge.  Respiratory: Negative for cough and shortness of breath.   Cardiovascular: Negative for chest pain, palpitations and leg swelling.  Gastrointestinal: Negative for abdominal pain, blood in stool, constipation, diarrhea, nausea and vomiting.  Genitourinary: Negative for dysuria, frequency, hematuria and urgency.  Musculoskeletal: Negative for back pain and myalgias.  Skin: Negative for rash.  Allergic/Immunologic: Negative for environmental allergies.  Neurological: Positive for numbness. Negative for dizziness, weakness and headaches.  Hematological: Does not bruise/bleed easily.  Psychiatric/Behavioral: Negative for suicidal ideas. The patient is not nervous/anxious.     History Past Medical History:  Diagnosis Date  . Arthritis   . Atrial fibrillation ([place])    h/o post-op 04/2010  . Breast cancer ([place])    x 3 cancer - radiation right x2 ''00,'05, left '07 mammosite radiation   . Diverticulosis   . 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  . Fatty liver 01/10/07  . GERD (gastroesophageal reflux disease)   . Headache    migraines   . 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 HTN (hypertension)   . Hyperlipidemia   . Hyperplastic colon polyp   . Hypothyroidism   . IBS (irritable bowel syndrome)   . Lower extremity neuropathy    occ. affects walking and balance"foot to knees"  . Obesity   . Renal cyst, left   . Thyroid cancer [facility])     She has a past surgical history that includes Cholecystectomy; Tonsillectomy; Colonoscopy w/ polypectomy; Breast  lumpectomy (2000); Median sternotomy, extracorporeal circulation, repair of type 1 aortic dissection with  tube graft from  sinotubular junction to take off of innominate artery, distal anastomoses under deep bypothermic circulatory arrest (05/03/2010); Hysteroscopy w/D&C (N/A, 04/13/2014); Robotic assisted total hysterectomy with bilateral salpingo oophorectomy (Bilateral, 05/18/2014); Total thyroidectomy (01/09/2016); Coronary artery bypass graft (05/03/2010); and Thyroidectomy (N/A, 01/09/2016).   Her family history includes Alzheimer's disease in her mother; Aneurysm in her father; Goiter in her mother; Heart attack in her father; Hypertension in her brother and mother; Kidney disease in her brother.She reports that she has never smoked. She has never used smokeless tobacco. She reports that she does not drink alcohol or use drugs.  Current Outpatient Prescriptions on File Prior to Visit  Medication Sig Dispense Refill  . acetaminophen (TYLENOL) 500 MG tablet Take 1,000 mg by mouth every 6 (six) hours as needed for moderate pain or headache.    Marland Kitchen aspirin 81 MG tablet Take 81 mg by mouth every morning.     . calcium carbonate (TUMS) 500 MG chewable tablet Chew 2 tablets (400 mg of elemental calcium total) by mouth 3 (three) times daily. 90 tablet 1  . CALCIUM-MAGNESIUM-VITAMIN D PO Take 1 tablet by mouth 2 (two) times daily.     . fish oil-omega-3 fatty acids 1000 MG capsule Take 1 g by mouth 2 (two) times daily.     . hydrocortisone 2.5 % cream Apply 1 application topically daily as needed (groin irritation).   0  . ibuprofen (ADVIL,MOTRIN) 200 MG tablet  Take 400 mg by mouth every 6 (six) hours as needed for headache or moderate pain.    Marland Kitchen ketoconazole (NIZORAL) 2 % cream Apply 1 application topically daily as needed for irritation (groin).   2  . LORazepam (ATIVAN) 1 MG tablet TAKE 1 TABLET BY MOUTH AS NEEDED( NOT ON A REGULAR BASIS) 30 tablet 0  . metoprolol (LOPRESSOR) 50 MG tablet Take 1 tablet (50  mg total) by mouth 2 (two) times daily. 180 tablet 3  . OVER THE COUNTER MEDICATION Take 1 tablet by mouth every morning. Product Name: EHT Mind enhancement formula Vitamin d 200 units Vitamin b 1.6 mg Vitamin b-12 10 mcg Selinum 70 mcg Coffee Extract 35 mg Alpha leporic acid 50 mg huperzine-a 50 mcg    . OVER THE COUNTER MEDICATION Take 1 tablet by mouth daily. Vitamin B12, Vitamin B1 and Alpha Leporic Acid    . Probiotic Product (PROBIOTIC PO) Take 1 capsule by mouth 2 (two) times daily.    Marland Kitchen amLODipine (NORVASC) 5 MG tablet TAKE 1 TABLET(5 MG) BY MOUTH DAILY (Patient not taking: Reported on 05/10/2016) 30 tablet 10  . benazepril (LOTENSIN) 40 MG tablet TAKE 1 TABLET BY MOUTH DAILY. KEEP OFFICE VISIT (Patient not taking: Reported on 05/10/2016) 90 tablet 0  . HYDROcodone-acetaminophen (NORCO/VICODIN) 5-325 MG tablet Take 1-2 tablets by mouth every 4 (four) hours as needed for moderate pain. (Patient not taking: Reported on 05/10/2016) 20 tablet 0  . [DISCONTINUED] amLODipine-benazepril (LOTREL) 5-20 MG per capsule Take 1 capsule by mouth daily.      . [DISCONTINUED] escitalopram (LEXAPRO) 10 MG tablet Take 10 mg by mouth daily.      . [DISCONTINUED] lisinopril (PRINIVIL,ZESTRIL) 20 MG tablet Take 20 mg by mouth daily.       No current facility-administered medications on file prior to visit.      Objective:  Objective  Physical Exam  Constitutional: She is oriented to person, place, and time. She appears well-developed and well-nourished.  HENT:  Head: Normocephalic and atraumatic.  Eyes: Conjunctivae and EOM are normal.  Neck: Normal range of motion. Neck supple. No JVD present. Carotid bruit is not present. No thyromegaly present.  Cardiovascular: Normal rate, regular rhythm and normal heart sounds.   No murmur heard. Pulmonary/Chest: Effort normal and breath sounds normal. No respiratory distress. She has no wheezes. She has no rales. She exhibits no tenderness.  Musculoskeletal: She  exhibits no edema.  Neurological: She is alert and oriented to person, place, and time.  Psychiatric: She has a normal mood and affect.  Nursing note and vitals reviewed.  BP 128/74 (BP Location: Right Arm, Patient Position: Sitting, Cuff Size: Normal)   Pulse (!) 57   Temp 97.7 F (36.5 C) (Oral)   Resp 16   Ht 5\' 4"  (1.626 m)   Wt 250 lb 6.4 oz (113.6 kg)   SpO2 97%   BMI 42.98 kg/m  Wt Readings from Last 3 Encounters:  05/10/16 250 lb 6.4 oz (113.6 kg)  02/16/16 248 lb (112.5 kg)  01/09/16 240 lb 1.3 oz (108.9 kg)     Lab Results  Component Value Date   WBC 8.0 01/09/2016   HGB 13.1 01/09/2016   HCT 38.1 01/09/2016   PLT 177 01/09/2016   GLUCOSE 85 05/10/2016   CHOL 263 (H) 08/17/2014   TRIG 325 (H) 08/17/2014   HDL 39 (L) 08/17/2014   LDLDIRECT 148.3 03/29/2006   LDLCALC 159 (H) 08/17/2014   ALT 74 (H) 05/10/2016  AST 97 (H) 05/10/2016   NA 137 05/10/2016   K 4.2 05/10/2016   CL 103 05/10/2016   CREATININE 0.95 05/10/2016   BUN 23 05/10/2016   CO2 29 05/10/2016   TSH 21.20 (H) 03/22/2016   INR 2 09/01/2014   HGBA1C 5.8 08/17/2014    Nm Whole Body I131 Scan S/p Ca Rx  Result Date: 03/26/2016 CLINICAL DATA:  Papillary thyroid cancer. Status post remnant ablation. T1b N0. EXAM: NUCLEAR MEDICINE I-131 POST THERAPY WHOLE BODY SCAN TECHNIQUE: The patient received 46.7 mCi I-131 sodium iodide for the treatment of thyroid cancer within the past 10 days. The patient returns today, and whole body scanning was performed in the anterior and posterior projections. COMPARISON:  None. FINDINGS: There is a medium size focus of moderate increased uptake within the thyroid bed compatible with residual functioning thyroid tissue. No additional foci of abnormal increased radiotracer uptake is identified to suggest metastatic disease. IMPRESSION: 1. Expected radiotracer uptake identified within the thyroid bed compatible with thyroid remnant. 2. No evidence for distant metastatic  disease. Electronically Signed   By: Kerby Moors M.D.   On: 03/26/2016 10:02     Assessment & Plan:  Plan  I have discontinued Ms. Fuge zoster vaccine live (PF). I have also changed her hydrochlorothiazide. Additionally, I am having her start on Zoster Vac Recomb Adjuvanted and valACYclovir. Lastly, I am having her maintain her aspirin, fish oil-omega-3 fatty acids, CALCIUM-MAGNESIUM-VITAMIN D PO, hydrocortisone, ketoconazole, ibuprofen, acetaminophen, OVER THE COUNTER MEDICATION, LORazepam, metoprolol, Probiotic Product (PROBIOTIC PO), OVER THE COUNTER MEDICATION, HYDROcodone-acetaminophen, calcium carbonate, amLODipine, benazepril, and levothyroxine.  Meds ordered this encounter  Medications  . Zoster Vac Recomb Adjuvanted (SHINGRIX) 50 MCG SUSR    Sig: Inject 50 mcg into the muscle once. Repeat in 2-6 months    Dispense:  1 each    Refill:  1  . valACYclovir (VALTREX) 1000 MG tablet    Sig: Take 1 tablet (1,000 mg total) by mouth 3 (three) times daily.    Dispense:  30 tablet    Refill:  0  . levothyroxine (SYNTHROID, LEVOTHROID) 137 MCG tablet    Sig: TAKE 1 TABLET(137 MCG) BY MOUTH DAILY BEFORE BREAKFAST    Dispense:  90 tablet    Refill:  1    **Patient requests 90 days supply**  . hydrochlorothiazide (MICROZIDE) 12.5 MG capsule    Sig: Take 2 capsules (25 mg total) by mouth daily.    Dispense:  180 capsule    Refill:  3    Problem List Items Addressed This Visit      Unprioritized   Hypothyroidism   Relevant Medications   levothyroxine (SYNTHROID, LEVOTHROID) 137 MCG tablet   Numerous moles    Refer to derm Pt will call       Other Visit Diagnoses    Encounter for Medicare annual wellness exam    -  Primary   Asymptomatic postmenopausal state       Relevant Orders   DG BONE DENSITY (DXA)   Peripheral polyneuropathy ([place])       Relevant Orders   Ambulatory referral to Neurology   Comprehensive metabolic panel (Completed)   Vitamin B12 (Completed)    Magnesium (Completed)   Phosphorus (Completed)   Need for shingles vaccine       Cold sore       Relevant Medications   valACYclovir (VALTREX) 1000 MG tablet   Gait disturbance       Relevant Orders   Comprehensive metabolic  panel (Completed)   Vitamin B12 (Completed)   Magnesium (Completed)   Phosphorus (Completed)   Hypertension, unspecified type       Relevant Medications   hydrochlorothiazide (MICROZIDE) 12.5 MG capsule    CMA served as scribe during this visit. History, Physical and Plan performed by medical provider. Documentation and orders reviewed and attested to.   Pt feels physical therapy will not help and wants to hold off.   Follow-up: Return in about 1 year (around 05/10/2017) for annual exam, fasting.  Ann Held, DO

## 2016-05-10 NOTE — Progress Notes (Signed)
Pre visit review using our clinic review tool, if applicable. No additional management support is needed unless otherwise documented below in the visit note. 

## 2016-05-10 NOTE — Assessment & Plan Note (Signed)
Refer to derm Pt will call

## 2016-05-10 NOTE — Patient Instructions (Addendum)
Continue doing brain stimulating activities (puzzles, reading, adult coloring books, staying active) to keep memory sharp.   Bring a copy of your advance directives to your next office visit.   Preventive Care 73 Years and Older, Female Preventive care refers to lifestyle choices and visits with your health care provider that can promote health and wellness. What does preventive care include?  A yearly physical exam. This is also called an annual well check.  Dental exams once or twice a year.  Routine eye exams. Ask your health care provider how often you should have your eyes checked.  Personal lifestyle choices, including:  Daily care of your teeth and gums.  Regular physical activity.  Eating a healthy diet.  Avoiding tobacco and drug use.  Limiting alcohol use.  Practicing safe sex.  Taking low-dose aspirin every day.  Taking vitamin and mineral supplements as recommended by your health care provider. What happens during an annual well check? The services and screenings done by your health care provider during your annual well check will depend on your age, overall health, lifestyle risk factors, and family history of disease. Counseling  Your health care provider may ask you questions about your:  Alcohol use.  Tobacco use.  Drug use.  Emotional well-being.  Home and relationship well-being.  Sexual activity.  Eating habits.  History of falls.  Memory and ability to understand (cognition).  Work and work Statistician.  Reproductive health. Screening  You may have the following tests or measurements:  Height, weight, and BMI.  Blood pressure.  Lipid and cholesterol levels. These may be checked every 5 years, or more frequently if you are over 69 years old.  Skin check.  Lung cancer screening. You may have this screening every year starting at age 25 if you have a 30-pack-year history of smoking and currently smoke or have quit within the past  15 years.  Fecal occult blood test (FOBT) of the stool. You may have this test every year starting at age 60.  Flexible sigmoidoscopy or colonoscopy. You may have a sigmoidoscopy every 5 years or a colonoscopy every 10 years starting at age 4.  Hepatitis C blood test.  Hepatitis B blood test.  Sexually transmitted disease (STD) testing.  Diabetes screening. This is done by checking your blood sugar (glucose) after you have not eaten for a while (fasting). You may have this done every 1-3 years.  Bone density scan. This is done to screen for osteoporosis. You may have this done starting at age 73.  Mammogram. This may be done every 1-2 years. Talk to your health care provider about how often you should have regular mammograms. Talk with your health care provider about your test results, treatment options, and if necessary, the need for more tests. Vaccines  Your health care provider may recommend certain vaccines, such as:  Influenza vaccine. This is recommended every year.  Tetanus, diphtheria, and acellular pertussis (Tdap, Td) vaccine. You may need a Td booster every 10 years.  Varicella vaccine. You may need this if you have not been vaccinated.  Zoster vaccine. You may need this after age 73.  Measles, mumps, and rubella (MMR) vaccine. You may need at least one dose of MMR if you were born in 1957 or later. You may also need a second dose.  Pneumococcal 13-valent conjugate (PCV13) vaccine. One dose is recommended after age 5.  Pneumococcal polysaccharide (PPSV23) vaccine. One dose is recommended after age 73.  Meningococcal vaccine. You may need this  if you have certain conditions.  Hepatitis A vaccine. You may need this if you have certain conditions or if you travel or work in places where you may be exposed to hepatitis A.  Hepatitis B vaccine. You may need this if you have certain conditions or if you travel or work in places where you may be exposed to hepatitis  B.  Haemophilus influenzae type b (Hib) vaccine. You may need this if you have certain conditions. Talk to your health care provider about which screenings and vaccines you need and how often you need them. This information is not intended to replace advice given to you by your health care provider. Make sure you discuss any questions you have with your health care provider. Document Released: 04/22/2015 Document Revised: 12/14/2015 Document Reviewed: 01/25/2015 Elsevier Interactive Patient Education  2017 [employer].

## 2016-05-10 NOTE — Progress Notes (Signed)
   Subjective:   HPI ROS  Objective:    Physical Exam Assessment & Plan:   

## 2016-05-10 NOTE — Progress Notes (Signed)
reviewed

## 2016-05-11 ENCOUNTER — Other Ambulatory Visit: Payer: Self-pay | Admitting: *Deleted

## 2016-05-11 LAB — COMPREHENSIVE METABOLIC PANEL
ALBUMIN: 4.3 g/dL (ref 3.5–5.2)
ALK PHOS: 74 U/L (ref 39–117)
ALT: 74 U/L — AB (ref 0–35)
AST: 97 U/L — AB (ref 0–37)
BILIRUBIN TOTAL: 1.9 mg/dL — AB (ref 0.2–1.2)
BUN: 23 mg/dL (ref 6–23)
CALCIUM: 9.9 mg/dL (ref 8.4–10.5)
CO2: 29 mEq/L (ref 19–32)
Chloride: 103 mEq/L (ref 96–112)
Creatinine, Ser: 0.95 mg/dL (ref 0.40–1.20)
GFR: 61.43 mL/min (ref >60.00)
Glucose, Bld: 85 mg/dL (ref 70–99)
Potassium: 4.2 mEq/L (ref 3.5–5.1)
SODIUM: 137 meq/L (ref 135–145)
TOTAL PROTEIN: 7.8 g/dL (ref 6.0–8.3)

## 2016-05-11 LAB — VITAMIN B12: VITAMIN B 12: 864 pg/mL (ref 211–911)

## 2016-05-11 LAB — PHOSPHORUS: Phosphorus: 3.7 mg/dL (ref 2.3–4.6)

## 2016-05-11 LAB — MAGNESIUM: MAGNESIUM: 2.3 mg/dL (ref 1.5–2.5)

## 2016-05-11 NOTE — Progress Notes (Signed)
reviewed

## 2016-05-12 MED ORDER — LEVOTHYROXINE SODIUM 137 MCG PO TABS: ORAL_TABLET | ORAL | 1 refills | Status: DC

## 2016-05-12 MED ORDER — HYDROCHLOROTHIAZIDE 12.5 MG PO CAPS: 25.0000 mg | ORAL_CAPSULE | Freq: Every day | ORAL | 3 refills | Status: DC

## 2016-05-14 MED ORDER — BENAZEPRIL HCL 40 MG PO TABS: 40.0000 mg | ORAL_TABLET | Freq: Every day | ORAL | 1 refills | Status: DC

## 2016-05-14 NOTE — Telephone Encounter (Signed)
Rx(s) sent to pharmacy electronically.  

## 2016-05-15 ENCOUNTER — Encounter: Payer: Self-pay | Admitting: Internal Medicine

## 2016-05-15 ENCOUNTER — Ambulatory Visit (INDEPENDENT_AMBULATORY_CARE_PROVIDER_SITE_OTHER): Payer: Medicare Other | Admitting: Internal Medicine

## 2016-05-15 ENCOUNTER — Other Ambulatory Visit: Payer: Self-pay | Admitting: Family Medicine

## 2016-05-15 VITALS — BP 132/82 | HR 55 | Ht 64.5 in | Wt 251.0 lb

## 2016-05-15 DIAGNOSIS — E890 Postprocedural hypothyroidism: Secondary | ICD-10-CM

## 2016-05-15 DIAGNOSIS — R945 Abnormal results of liver function studies: Principal | ICD-10-CM

## 2016-05-15 DIAGNOSIS — R7989 Other specified abnormal findings of blood chemistry: Secondary | ICD-10-CM

## 2016-05-15 DIAGNOSIS — C73 Malignant neoplasm of thyroid gland: Secondary | ICD-10-CM | POA: Diagnosis not present

## 2016-05-15 LAB — T4, FREE: FREE T4: 0.83 ng/dL (ref 0.60–1.60)

## 2016-05-15 LAB — TSH: TSH: 11.48 u[IU]/mL — ABNORMAL HIGH (ref 0.35–4.50)

## 2016-05-15 NOTE — Progress Notes (Signed)
Patient ID: Breanna Hernandez, female   DOB: 05/08/1945, 71 y.o.   MRN: [id number]    HPI  Breanna Hernandez is a 71 y.o.-year-old female, referred by Dr. Harlow Asa, for management of thyroid cancer and postsurgical hypothyroidism.  Pt has a h/o Ao dissection in 2012 >> has CT scans q 2 years >> thyroid nodule incidentally found on last CT scan.  Patient had a thyroid ultrasound on 10/21/2015 that showed a right 1.5 x 1.4 x 1.4 cm nodule, solid, with punctate calcifications.  Pt. Had an FNA of this nodule in 11/2015: PTC.  Patient had total thyroidectomy by Dr. Harlow Asa on 01/09/2016. 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 Diagnosis 1. Thyroid, lobectomy, Right - PAPILLARY THYROID CARCINOMA, 1.5 CM. - LYMPHOVASCULAR INVASION IS IDENTIFIED. - CARCINOMA IS CONFINED TO THE THYROID. - THE SURGICAL RESECTION MARGINS ARE NEGATIVE FOR CARCINOMA. - TWO BENIGN LYMPH NODES (0/2). - SEE ONCOLOGY TABLE BELOW. 2. Thyroid, lobectomy, Left - LYMPHOCYTIC THYROIDITIS. - THERE IS NO EVIDENCE OF MALIGNANCY. Microscopic Comment 1. THYROID Specimen: Bilateral thyroid lobes Procedure (including lymph node sampling if applicable): Bilateral hemi-thyroidectomy Specimen Integrity (intact/fragmented): Intact Tumor focality: Unifocal Dominant tumor: Maximum tumor size (cm): 1.5 cm (gross measurement). 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 Capsular invasion with degree of invasion if present: N/A Margins: Negative for carcinoma Lymphatic or vascular invasion: Present, focal Lymph nodes: # examined 2; # positive; 0 Extracapsular extension (if applicable): Not identified. TNM code: pT1b , pN0 Non-neoplastic thyroid: Lymphocytic thyroiditis.  Enid Cutter MD Pathologist, Electronic  Signature (Case signed 01/10/2016)  Patient had RAI treatment with 46.7 mCi I-131 on 03/19/2016.  On 03/26/2016, the posttreatment whole-body scan was negative for metastases.  For her postsurgical hypothyroidism, she is on Levothyroxine 137 mcg, taken: - fasting - with water - separated by >30 min from b'fast  - no iron, PPIs - + multivitamins 4h after LT4 - stopped Calcium She has a h/o hypothyroidism for >30 years >> was on a stable dose of 75 mcg LT4 before the Sx.  I reviewed pt's thyroid tests: Lab Results  Component Value Date   TSH 21.20 (H) 03/22/2016   TSH 37.51 (H) 02/16/2016   TSH 3.10 09/29/2015   TSH 3.11 08/17/2014   TSH 1.72 09/22/2013   TSH 3.70 09/20/2010   TSH 8.161 (H) 05/16/2010   TSH 7.891 (H) 05/11/2010   TSH 4.18 05/27/2009   TSH 3.75 05/16/2007   FREET4 0.87 03/22/2016   FREET4 0.47 (L) 02/16/2016   FREET4 0.99 09/20/2010    Pt describes: - no fatigue - + weight gain - no heat/cold intolerance - no dry skin - + hair loss - no anxiety/depression  She has + FH of thyroid disorders in: mother (thyroid nodules). No FH of thyroid cancer.  No h/o radiation tx to head or neck, but had RxTx for BrCA - not as well localized in the R breast.  ROS: Constitutional: + weight gain, no fatigue, no subjective hyperthermia/hypothermia, + nocturia Eyes: no blurry vision, no xerophthalmia ENT: no sore throat, no nodules palpated in throat, no dysphagia/odynophagia, no hoarseness Cardiovascular: no CP/SOB/palpitations/leg swelling Respiratory: no cough/SOB Gastrointestinal: no N/V/D/C Musculoskeletal: no muscle aches/joint aches Skin: no rashes, + hair loss Neurological: no tremors/numbness/tingling/dizziness  I reviewed pt's medications, allergies, PMH, social hx, family hx, and changes were documented in the history of present illness.  Otherwise, unchanged from my initial visit note.  Past Medical History:  Diagnosis Date  . Arthritis   . Atrial  fibrillation ([place])    h/o post-op 04/2010  . Breast cancer ([place])    x 3 cancer - radiation right x2 ''00,'05, left '07 mammosite radiation   . Diverticulosis   . 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  . Fatty liver 01/10/07  . GERD (gastroesophageal reflux disease)   . Headache    migraines   . 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 HTN (hypertension)   . Hyperlipidemia   . Hyperplastic colon polyp   . Hypothyroidism   . IBS (irritable bowel syndrome)   . Lower extremity neuropathy    occ. affects walking and balance"foot to knees"  . Obesity   . Renal cyst, left   . Thyroid cancer [facility])    Past Surgical History:  Procedure Laterality Date  . BREAST LUMPECTOMY  2000   Right x2, Left x1  . CHOLECYSTECTOMY    . COLONOSCOPY W/ POLYPECTOMY    . CORONARY ARTERY BYPASS GRAFT  05/03/2010   CABG X1, RCA -Dr Roxan Hockey (with type 1 aortic dissection)  . HYSTEROSCOPY W/D&C N/A 04/13/2014   Procedure: DILATATION AND CURETTAGE /HYSTEROSCOPY Polypectomy;  Surgeon: Maeola Sarah. Landry Mellow, MD;  Location: [facility];  Service: Gynecology;  Laterality: N/A;  Possible Polypectomy  . Median sternotomy, extracorporeal circulation, repair of type 1 aortic dissection with  tube graft from  sinotubular junction to take off of innominate artery, distal anastomoses under deep bypothermic circulatory arrest  05/03/2010   Dr Roxan Hockey  . ROBOTIC ASSISTED TOTAL HYSTERECTOMY WITH BILATERAL SALPINGO OOPHERECTOMY Bilateral 05/18/2014   Procedure: ROBOTIC ASSISTED TOTAL HYSTERECTOMY WITH BILATERAL SALPINGO OOPHORECTOMY SENTINAL LYMPH NODE MAPPING ;  Surgeon: Everitt Amber, MD;  Location: [facility];  Service: Gynecology;  Laterality: Bilateral;  . THYROIDECTOMY N/A 01/09/2016   Procedure: TOTAL THYROIDECTOMY;  Surgeon: Armandina Gemma, MD;  Location: [facility];  Service: General;  Laterality: N/A;  . TONSILLECTOMY    . TOTAL THYROIDECTOMY  01/09/2016   Social History   Social History  .  Marital status: Married    Spouse name: N/A  . Number of children: 6   Occupational History  . retired   Social History Main Topics  . Smoking status: Never Smoker  . Smokeless tobacco: Never Used  . Alcohol use No  . Drug use: No   Current Outpatient Prescriptions on File Prior to Visit  Medication Sig Dispense Refill  . acetaminophen (TYLENOL) 500 MG tablet Take 1,000 mg by mouth every 6 (six) hours as needed for moderate pain or headache.    Marland Kitchen amLODipine (NORVASC) 5 MG tablet TAKE 1 TABLET(5 MG) BY MOUTH DAILY 30 tablet 10  . aspirin 81 MG tablet Take 81 mg by mouth every morning.     . benazepril (LOTENSIN) 40 MG tablet Take 1 tablet (40 mg total) by mouth daily. 90 tablet 1  . CALCIUM-MAGNESIUM-VITAMIN D PO Take 1 tablet by mouth 2 (two) times daily.     . fish oil-omega-3 fatty acids 1000 MG capsule Take 1 g by mouth 2 (two) times daily.     . hydrochlorothiazide (MICROZIDE) 12.5 MG capsule Take 2 capsules (25 mg total) by mouth daily. 180 capsule 3  . hydrocortisone 2.5 % cream Apply 1 application topically daily as needed (groin irritation).   0  . ibuprofen (ADVIL,MOTRIN) 200 MG tablet Take 400  mg by mouth every 6 (six) hours as needed for headache or moderate pain.    Marland Kitchen ketoconazole (NIZORAL) 2 % cream Apply 1 application topically daily as needed for irritation (groin).   2  . levothyroxine (SYNTHROID, LEVOTHROID) 137 MCG tablet TAKE 1 TABLET(137 MCG) BY MOUTH DAILY BEFORE BREAKFAST 90 tablet 1  . LORazepam (ATIVAN) 1 MG tablet TAKE 1 TABLET BY MOUTH AS NEEDED( NOT ON A REGULAR BASIS) 30 tablet 0  . metoprolol (LOPRESSOR) 50 MG tablet Take 1 tablet (50 mg total) by mouth 2 (two) times daily. 180 tablet 3  . OVER THE COUNTER MEDICATION Take 1 tablet by mouth every morning. Product Name: EHT Mind enhancement formula Vitamin d 200 units Vitamin b 1.6 mg Vitamin b-12 10 mcg Selinum 70 mcg Coffee Extract 35 mg Alpha leporic acid 50 mg huperzine-a 50 mcg    . OVER THE COUNTER  MEDICATION Take 1 tablet by mouth daily. Vitamin B12, Vitamin B1 and Alpha Leporic Acid    . Probiotic Product (PROBIOTIC PO) Take 1 capsule by mouth 2 (two) times daily.    . valACYclovir (VALTREX) 1000 MG tablet Take 1 tablet (1,000 mg total) by mouth 3 (three) times daily. 30 tablet 0  . calcium carbonate (TUMS) 500 MG chewable tablet Chew 2 tablets (400 mg of elemental calcium total) by mouth 3 (three) times daily. (Patient not taking: Reported on 05/15/2016) 90 tablet 1  . HYDROcodone-acetaminophen (NORCO/VICODIN) 5-325 MG tablet Take 1-2 tablets by mouth every 4 (four) hours as needed for moderate pain. (Patient not taking: Reported on 05/10/2016) 20 tablet 0  . [DISCONTINUED] amLODipine-benazepril (LOTREL) 5-20 MG per capsule Take 1 capsule by mouth daily.      . [DISCONTINUED] escitalopram (LEXAPRO) 10 MG tablet Take 10 mg by mouth daily.      . [DISCONTINUED] lisinopril (PRINIVIL,ZESTRIL) 20 MG tablet Take 20 mg by mouth daily.       No current facility-administered medications on file prior to visit.    No Known Allergies Family History  Problem Relation Age of Onset  . Aneurysm Father     CNS  . Heart attack Father     x2  . Alzheimer's disease Mother   . Hypertension Mother   . Goiter Mother   . Hypertension Brother   . Kidney disease Brother   . Colon cancer Neg Hx    PE: BP 132/82 (BP Location: Left Arm, Patient Position: Sitting)   Pulse (!) 55   Ht 5' 4.5" (1.638 m)   Wt 251 lb (113.9 kg)   SpO2 96%   BMI 42.42 kg/m  Wt Readings from Last 3 Encounters:  05/15/16 251 lb (113.9 kg)  05/10/16 250 lb 6.4 oz (113.6 kg)  02/16/16 248 lb (112.5 kg)   Constitutional: overweight, in NAD Eyes: PERRLA, EOMI, no exophthalmos ENT: moist mucous membranes, thyroidectomy scar healing, still slightly erythematous, No swelling, no keloid; no cervical lymphadenopathy Cardiovascular: RRR, No MRG Respiratory: CTA B Gastrointestinal: abdomen soft, NT, ND, BS+ Musculoskeletal: no  deformities, strength intact in all 4 Skin: moist, warm, no rashes Neurological: no tremor with outstretched hands, DTR normal in all 4  ASSESSMENT: 1. Thyroid cancer - see HPI  2. Postsurgical Hypothyroidism  PLAN:  1. Thyroid cancer - papillary -Pt with stage 1 TNM. The tumor was >1.5 cm, however, it had lympho-vascular invasion and it also contained Hurthle cell features >> I recommended radioactive iodine for post-op thyroid remnant ablation. I explained that the main role of this is  to facilitate monitoring in the long run (by checking thyroglobulin) >> she had RAI tx with Thyrogen on 03/19/2016 (46.7 mCi). A post-tx WBS was negative for metastases. - I again reassured her that papillary thyroid cancer is a slow growing cancer with good prognosis; her life expectancy is unlikely to be reduced due to the cancer.  - we will check a thyroglobulin and Tg antibodies today - We will need another thyroid ultrasound a year from her surgery - I will see the patient back in 6 months  2. Patient with h/o total thyroidectomy for cancer, now with iatrogenic hypothyroidism, on levothyroxine therapy. She is now on 137 g daily. - We discussed about correct intake of levothyroxine, fasting, with water, separated by at least 30 minutes from breakfast, and separated by more than 4 hours from calcium, iron, multivitamins, acid reflux medications (PPIs). She was taking the levothyroxine along with calcium. At last visit, I advised her to move calcium with lunch or later and then we stopped it as her Calcium level was normal. - will check thyroid tests: TSH, free T4 - If these are normal, we will recheck them at next visit  Component     Latest Ref Rng & Units 05/15/2016  TSH     0.35 - 4.50 uIU/mL 11.48 (H)  T4,Free(Direct)     0.60 - 1.60 ng/dL 0.83  Thyroglobulin     ng/mL <0.1 (L)  Thyroglobulin Ab     <2 IU/mL 2 (H)   Thyroglobulin level is great, however, her thyroglobulin antibodies are  positive and will need to check with the [facility] assay at next visit. Her TSH is still high. We'll increase the dose to 150 g levothyroxine. We'll need a repeat set of labs in 1.5 months.   CC: Dr Sallyanne Kuster  Philemon Kingdom, MD PhD [facility]

## 2016-05-15 NOTE — Patient Instructions (Signed)
Please stop at the lab.  Please continue Levothyroxine 137 mcg daily.  Take the thyroid hormone every day, with water, at least 30 minutes before breakfast, separated by at least 4 hours from: - acid reflux medications - calcium - iron - multivitamins  Please come back for a follow-up appointment in 6 months.

## 2016-05-16 ENCOUNTER — Other Ambulatory Visit: Payer: Self-pay | Admitting: Cardiovascular Disease

## 2016-05-16 LAB — THYROGLOBULIN ANTIBODY: Thyroglobulin Ab: 2 IU/mL — ABNORMAL HIGH (ref <2)

## 2016-05-16 LAB — THYROGLOBULIN LEVEL

## 2016-05-16 NOTE — Telephone Encounter (Signed)
Rx(s) sent to pharmacy electronically.  

## 2016-05-17 ENCOUNTER — Ambulatory Visit: Payer: Medicare Other | Admitting: Endocrinology

## 2016-05-17 MED ORDER — LEVOTHYROXINE SODIUM 150 MCG PO TABS: 150.0000 ug | ORAL_TABLET | Freq: Every day | ORAL | 1 refills | Status: DC

## 2016-05-18 ENCOUNTER — Other Ambulatory Visit: Payer: Self-pay

## 2016-05-18 ENCOUNTER — Telehealth: Payer: Self-pay | Admitting: Internal Medicine

## 2016-05-18 MED ORDER — LEVOTHYROXINE SODIUM 150 MCG PO TABS: 150.0000 ug | ORAL_TABLET | Freq: Every day | ORAL | 1 refills | Status: DC

## 2016-05-18 NOTE — Telephone Encounter (Signed)
rx submitted.  

## 2016-05-18 NOTE — Telephone Encounter (Signed)
Refill of  levothyroxine (SYNTHROID, LEVOTHROID) 150 MCG tablet  [id number] - [place], [place] - [address] [phone] (Phone) [phone] (Fax)

## 2016-05-24 ENCOUNTER — Ambulatory Visit (HOSPITAL_BASED_OUTPATIENT_CLINIC_OR_DEPARTMENT_OTHER)
Admission: RE | Admit: 2016-05-24 | Discharge: 2016-05-24 | Disposition: A | Discharge status: Home / Self Care | Payer: Medicare Other | Source: Ambulatory Visit | Attending: Family Medicine | Admitting: Family Medicine

## 2016-05-24 ENCOUNTER — Other Ambulatory Visit: Payer: Self-pay | Admitting: Family Medicine

## 2016-05-24 DIAGNOSIS — Z780 Asymptomatic menopausal state: Secondary | ICD-10-CM | POA: Diagnosis present

## 2016-05-24 DIAGNOSIS — N281 Cyst of kidney, acquired: Secondary | ICD-10-CM | POA: Insufficient documentation

## 2016-05-24 DIAGNOSIS — R748 Abnormal levels of other serum enzymes: Secondary | ICD-10-CM

## 2016-05-24 DIAGNOSIS — Z13820 Encounter for screening for osteoporosis: Secondary | ICD-10-CM | POA: Diagnosis present

## 2016-05-24 DIAGNOSIS — R7989 Other specified abnormal findings of blood chemistry: Secondary | ICD-10-CM | POA: Insufficient documentation

## 2016-05-24 DIAGNOSIS — Z9049 Acquired absence of other specified parts of digestive tract: Secondary | ICD-10-CM | POA: Diagnosis not present

## 2016-05-24 DIAGNOSIS — R945 Abnormal results of liver function studies: Secondary | ICD-10-CM

## 2016-06-05 ENCOUNTER — Encounter: Payer: Self-pay | Admitting: Family Medicine

## 2016-06-12 ENCOUNTER — Telehealth: Payer: Self-pay | Admitting: Family Medicine

## 2016-06-12 NOTE — Telephone Encounter (Signed)
She is referring to the MRI. She states her liver enzymes have been elevated for 10 years. She decided she is ok to have it done and husband would like her to proceed as well. She does want an open MRI and needs to know how long it will take. Will forward these questions to [facility]

## 2016-06-12 NOTE — Telephone Encounter (Signed)
°  Relation to [id number] Call back number: [phone] Pharmacy:  Reason for call: pt would like for you to call her, states she has some questions regarding the test that Dr. Etter Sjogren has suggested that she has done.

## 2016-06-29 ENCOUNTER — Other Ambulatory Visit: Payer: Medicare Other

## 2016-07-04 ENCOUNTER — Other Ambulatory Visit (INDEPENDENT_AMBULATORY_CARE_PROVIDER_SITE_OTHER): Payer: Medicare Other

## 2016-07-04 DIAGNOSIS — E890 Postprocedural hypothyroidism: Secondary | ICD-10-CM | POA: Diagnosis not present

## 2016-07-04 LAB — T4, FREE: FREE T4: 1.14 ng/dL (ref 0.60–1.60)

## 2016-07-04 LAB — TSH: TSH: 14.89 u[IU]/mL — AB (ref 0.35–4.50)

## 2016-07-05 MED ORDER — LEVOTHYROXINE SODIUM 175 MCG PO TABS: 175.0000 ug | ORAL_TABLET | Freq: Every day | ORAL | 1 refills | Status: DC

## 2016-07-09 ENCOUNTER — Telehealth: Payer: Self-pay

## 2016-07-09 ENCOUNTER — Telehealth: Payer: Self-pay | Admitting: Internal Medicine

## 2016-07-09 NOTE — Telephone Encounter (Signed)
Called and advised patient on how to take synthroid medication. Left call back number if any questions.

## 2016-07-09 NOTE — Telephone Encounter (Signed)
Called and LVM advising of how to take the synthroid. Left call back number.

## 2016-07-09 NOTE — Telephone Encounter (Signed)
Pt is needing some info on how to take the synthyroid   What can she take it with meaning drinks and how long does it take for the new dosing to kick in once it is started

## 2016-07-09 NOTE — Telephone Encounter (Signed)
She needs to take it with water, or she can take it with coffee only if she is drinking it black. We can recheck her thyroid tests in about 6 weeks. By that time, her thyroid levels will equilibrate.

## 2016-07-09 NOTE — Telephone Encounter (Signed)
Please advise for patient, thank you!

## 2016-07-10 ENCOUNTER — Other Ambulatory Visit: Payer: Self-pay

## 2016-07-10 ENCOUNTER — Telehealth: Payer: Self-pay | Admitting: Internal Medicine

## 2016-07-10 MED ORDER — LEVOTHYROXINE SODIUM 175 MCG PO TABS: 175.0000 ug | ORAL_TABLET | Freq: Every day | ORAL | 1 refills | Status: DC

## 2016-08-10 ENCOUNTER — Encounter: Payer: Self-pay | Admitting: Family Medicine

## 2016-08-10 DIAGNOSIS — H269 Unspecified cataract: Secondary | ICD-10-CM

## 2016-08-14 ENCOUNTER — Other Ambulatory Visit: Payer: Self-pay | Admitting: Family Medicine

## 2016-08-14 MED ORDER — LORAZEPAM 1 MG PO TABS: 1.0000 mg | ORAL_TABLET | ORAL | 0 refills | Status: DC | PRN

## 2016-09-04 ENCOUNTER — Other Ambulatory Visit: Payer: Self-pay | Admitting: Internal Medicine

## 2016-10-04 ENCOUNTER — Other Ambulatory Visit: Payer: Self-pay | Admitting: Internal Medicine

## 2016-10-18 ENCOUNTER — Encounter: Payer: Medicare Other | Admitting: Adult Health

## 2016-10-26 NOTE — Telephone Encounter (Signed)
error 

## 2016-10-31 ENCOUNTER — Other Ambulatory Visit: Payer: Self-pay | Admitting: Internal Medicine

## 2016-11-02 ENCOUNTER — Encounter: Payer: Medicare Other | Admitting: Adult Health

## 2016-11-09 ENCOUNTER — Other Ambulatory Visit: Payer: Self-pay | Admitting: Cardiovascular Disease

## 2016-11-12 ENCOUNTER — Ambulatory Visit (INDEPENDENT_AMBULATORY_CARE_PROVIDER_SITE_OTHER): Payer: Medicare Other | Admitting: Orthopaedic Surgery

## 2016-11-12 ENCOUNTER — Ambulatory Visit (INDEPENDENT_AMBULATORY_CARE_PROVIDER_SITE_OTHER): Payer: Medicare Other

## 2016-11-12 ENCOUNTER — Ambulatory Visit: Payer: Medicare Other | Admitting: Internal Medicine

## 2016-11-12 ENCOUNTER — Other Ambulatory Visit (INDEPENDENT_AMBULATORY_CARE_PROVIDER_SITE_OTHER): Payer: Self-pay

## 2016-11-12 ENCOUNTER — Encounter (INDEPENDENT_AMBULATORY_CARE_PROVIDER_SITE_OTHER): Payer: Self-pay | Admitting: Orthopaedic Surgery

## 2016-11-12 VITALS — BP 113/59 | HR 58 | Ht 64.0 in | Wt 242.0 lb

## 2016-11-12 DIAGNOSIS — M25551 Pain in right hip: Secondary | ICD-10-CM | POA: Diagnosis not present

## 2016-11-12 NOTE — Telephone Encounter (Signed)
REFILL 

## 2016-11-12 NOTE — Progress Notes (Signed)
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 Plan to see her back in 6 or 7 weeks for reevaluation if still symptomatic. Long discussion regarding use of medicines, weight loss and exercises  Follow-Up Instructions: No Follow-up on file.   Orders:  No orders of the defined types were placed in this encounter.  No orders of the defined types were placed in this encounter.     Procedures: No procedures performed   Clinical Data: No additional findings.   Subjective: Chief Complaint  Patient presents with  . Right Hip - Pain    Breanna Hernandez is a 71 y o that presents with R hip pain x 2 months. Denies injury,  Breanna Hernandez is complaining of pain in the area of her right hip. There is pain localized over the lateral aspect of her DIP near the greater trochanter extending into her groin. I did see her 2 years ago for evaluation of pain in the lumbar spine with referred discomfort to her right lower extremity. She does not think that this is the same type of pain. Pain seems to be worse when she is up and about and active. She's not experiencing back pain.  He is no numbness or tingling. CT scan of her lumbar spine was performed in June 2016. significant disc space narrowing at L3-4. Symptomatic right-sided neural impingement could've occurred at L3-4 L4-5 and L5-S1. She denies any injury or trauma. No fever or chills. No bowel or bladder changes. 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  HPI  Review of Systems  Unable to perform ROS: Other  Constitutional: Negative for chills, fatigue and fever.  Eyes: Negative for itching.  Respiratory: Negative for chest tightness and shortness of breath.   Cardiovascular: Negative for chest pain, palpitations and leg swelling.  Gastrointestinal: Negative for blood in stool, constipation and diarrhea.  Musculoskeletal: Negative for back pain, joint swelling, neck pain and neck stiffness.  Neurological: Negative for dizziness and numbness.  Hematological: Does not bruise/bleed easily.  Psychiatric/Behavioral: The patient is not nervous/anxious.      Objective: Vital Signs: There were no vitals taken for this visit.  Physical Exam  Ortho Exam awake alert and oriented 3 comfortable sitting. No ambulatory aid. No shortness of breath or chest pain. Walks with minimal limp particularly when she first gets up from a sitting position on the right. Decreased range of motion of right hip with internal/external rotation. None on the left. Straight leg raise negative bilaterally. Mild pitting edema both ankles. Has history of neuropathy-nondiabetic some numbness and tingling  Specialty Comments:  No specialty comments available.  Imaging: No results found.   PMFS History: Patient Active Problem List   Diagnosis Date Noted  . Bilateral cataracts 08/10/2016  . Numerous moles 05/10/2016  . CAD (coronary artery disease) 11/16/2015  .  Papillary thyroid carcinoma ([place]) 11/16/2015  . Aortic atherosclerosis ([place]) 11/16/2015  . Quality of life palliative care encounter 11/03/2015  . Elevated liver enzymes 10/06/2014  . Fasting hyperglycemia 08/17/2014  . Endometrial ca ([place]) 06/24/2014  . History of endometrial cancer 04/26/2014  . Postmenopausal bleeding 04/13/2014  . Neuropathy 10/02/2013  . Exertional chest pain 08/09/2013  . Atrial fibrillation, postoperative only  06/25/2012  . Long term current use of anticoagulant therapy 06/25/2012  . History of aortic dissection   . Hyperlipidemia 05/27/2009  . METABOLIC SYNDROME X 25/42/7062  . NONSPEC ELEVATION OF LEVELS OF TRANSAMINASE/LDH 05/27/2009  . Hypothyroidism 03/12/2008  . BREAST CANCER, HX OF 03/12/2008  . COLONIC POLYPS, HX OF 03/12/2008  . Essential hypertension 05/09/2006  . Esophageal reflux 05/09/2006   Past Medical History:  Diagnosis Date  . Arthritis   . Atrial fibrillation ([place])    h/o post-op 04/2010  . Breast cancer ([place])    x 3 cancer - radiation right x2 ''00,'05, left '07 mammosite radiation   . Diverticulosis   . 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  . Fatty liver 01/10/07  . GERD (gastroesophageal reflux disease)   . Headache    migraines   . 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 HTN (hypertension)   . Hyperlipidemia   . Hyperplastic colon polyp   . Hypothyroidism   . IBS (irritable bowel syndrome)   . Lower extremity neuropathy    occ. affects walking and balance"foot to knees"  . Obesity   . Renal cyst, left   . Thyroid cancer ([place])     Family History  Problem Relation Age of Onset  . Aneurysm Father        CNS  . Heart attack Father        x2  . Alzheimer's disease Mother   . Hypertension Mother   . Goiter Mother   . Hypertension Brother   . Kidney disease Brother   . Colon cancer Neg Hx     Past Surgical History:  Procedure Laterality Date  . BREAST LUMPECTOMY  2000   Right x2, Left x1  . CHOLECYSTECTOMY    . COLONOSCOPY W/ POLYPECTOMY    . CORONARY ARTERY BYPASS GRAFT  05/03/2010   CABG X1, RCA -Dr Roxan Hockey (with type 1 aortic dissection)  . HYSTEROSCOPY W/D&C N/A 04/13/2014   Procedure: DILATATION AND CURETTAGE /HYSTEROSCOPY Polypectomy;  Surgeon: Maeola Sarah. Landry Mellow, MD;  Location: [facility];  Service: Gynecology;  Laterality: N/A;  Possible Polypectomy  . Median sternotomy, extracorporeal circulation,  repair of type 1 aortic dissection with  tube graft from  sinotubular junction to take off of innominate artery, distal anastomoses under deep bypothermic circulatory arrest  05/03/2010   Dr Roxan Hockey  . ROBOTIC ASSISTED TOTAL HYSTERECTOMY WITH BILATERAL SALPINGO OOPHERECTOMY Bilateral 05/18/2014   Procedure: ROBOTIC ASSISTED TOTAL HYSTERECTOMY WITH BILATERAL SALPINGO OOPHORECTOMY SENTINAL LYMPH NODE MAPPING ;  Surgeon: Everitt Amber, MD;  Location: [facility];  Service: Gynecology;  Laterality: Bilateral;  . THYROIDECTOMY N/A 01/09/2016   Procedure: TOTAL THYROIDECTOMY;  Surgeon: Armandina Gemma, MD;  Location: [facility];  Service: General;  Laterality: N/A;  . TONSILLECTOMY    . TOTAL THYROIDECTOMY  01/09/2016   Social History   Occupational History  . Not on file.   Social History Main Topics  . Smoking status: Never Smoker  . Smokeless tobacco: Never Used  . Alcohol use No  . Drug use: No  .  Sexual activity: Yes

## 2016-11-13 ENCOUNTER — Encounter: Payer: Self-pay | Admitting: Internal Medicine

## 2016-11-13 ENCOUNTER — Other Ambulatory Visit: Payer: Self-pay | Admitting: Obstetrics and Gynecology

## 2016-11-13 ENCOUNTER — Ambulatory Visit (INDEPENDENT_AMBULATORY_CARE_PROVIDER_SITE_OTHER): Payer: Medicare Other | Admitting: Internal Medicine

## 2016-11-13 VITALS — BP 140/84 | HR 48 | Wt 254.2 lb

## 2016-11-13 DIAGNOSIS — Z1231 Encounter for screening mammogram for malignant neoplasm of breast: Secondary | ICD-10-CM

## 2016-11-13 DIAGNOSIS — C73 Malignant neoplasm of thyroid gland: Secondary | ICD-10-CM | POA: Diagnosis not present

## 2016-11-13 DIAGNOSIS — E890 Postprocedural hypothyroidism: Secondary | ICD-10-CM

## 2016-11-13 LAB — TSH: TSH: 3.54 u[IU]/mL (ref 0.35–4.50)

## 2016-11-13 NOTE — Progress Notes (Signed)
Patient ID: Breanna Hernandez, female   DOB: 06-Jul-1945, 71 y.o.   MRN: [id number]    HPI  Breanna Hernandez is a 71 y.o.-year-old female, initially referred by Dr. Harlow Asa, for management of thyroid cancer and postsurgical hypothyroidism. Last visit 6 mo ago.  Reviewed and addended hx: Pt has a h/o Ao dissection in 2012 >> has CT scans q 2 years >> thyroid nodule incidentally found on CT scan in 2017.  Patient had a thyroid ultrasound on 10/21/2015 that showed a right 1.5 x 1.4 x 1.4 cm nodule, solid, with punctate calcifications.  Pt. Had an FNA of this nodule in 11/2015: PTC.  Patient had total thyroidectomy by Dr. Harlow Asa on 01/09/2016. 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 Diagnosis 1. Thyroid, lobectomy, Right - PAPILLARY THYROID CARCINOMA, 1.5 CM. - LYMPHOVASCULAR INVASION IS IDENTIFIED. - CARCINOMA IS CONFINED TO THE THYROID. - THE SURGICAL RESECTION MARGINS ARE NEGATIVE FOR CARCINOMA. - TWO BENIGN LYMPH NODES (0/2). - SEE ONCOLOGY TABLE BELOW. 2. Thyroid, lobectomy, Left - LYMPHOCYTIC THYROIDITIS. - THERE IS NO EVIDENCE OF MALIGNANCY. Microscopic Comment 1. THYROID Specimen: Bilateral thyroid lobes Procedure (including lymph node sampling if applicable): Bilateral hemi-thyroidectomy Specimen Integrity (intact/fragmented): Intact Tumor focality: Unifocal Dominant tumor: Maximum tumor size (cm): 1.5 cm (gross measurement). 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 Capsular invasion with degree of invasion if present: N/A Margins: Negative for carcinoma Lymphatic or vascular invasion: Present, focal Lymph nodes: # examined 2; # positive; 0 Extracapsular extension (if applicable): Not identified. TNM code: pT1b , pN0 Non-neoplastic thyroid: Lymphocytic  thyroiditis.  Enid Cutter MD Pathologist, Electronic Signature (Case signed 01/10/2016)  Patient had RAI treatment with 46.7 mCi I-131 on 03/19/2016.  On 03/26/2016, the posttreatment whole-body scan was negative for metastases.  At last OV: Component     Latest Ref Rng & Units 05/15/2016  TSH     0.35 - 4.50 uIU/mL 11.48 (H)  T4,Free(Direct)     0.60 - 1.60 ng/dL 0.83  Thyroglobulin     ng/mL <0.1 (L)  Thyroglobulin Ab     <2 IU/mL 2 (H)   For her postsurgical hypothyroidism, she is on Levothyroxine 137 >> 150 >> 175 mcg, taken: - in am - fasting - at least 30 min from b'fast - + Ca at noon - no Fe, MVI, PPIs - not on Biotin She has a h/o hypothyroidism for >30 years >> was on a stable dose of 75 mcg LT4 before the Sx.  I reviewed pt's thyroid tests: Lab Results  Component Value Date   TSH 14.89 (H) 07/04/2016   TSH 11.48 (H) 05/15/2016   TSH 21.20 (H) 03/22/2016   TSH 37.51 (H) 02/16/2016   TSH 3.10 09/29/2015   TSH 3.11 08/17/2014   TSH 1.72 09/22/2013   TSH 3.70 09/20/2010   TSH 8.161 (H) 05/16/2010   TSH 7.891 (H) 05/11/2010   FREET4 1.14 07/04/2016   FREET4 0.83 05/15/2016   FREET4 0.87 03/22/2016   FREET4 0.47 (L) 02/16/2016   FREET4 0.99 09/20/2010    Pt denies: - feeling nodules in neck - hoarseness - dysphagia - choking - SOB with lying down  She has + FH of thyroid disorders in: mother (thyroid nodules). No FH of thyroid cancer. No h/o radiation tx to head or neck.  No seaweed or kelp. No recent contrast studies. No herbal supplements.  No Biotin use. No recent steroids use.   ROS: Constitutional: + weight gain/no weight loss, no fatigue, no subjective hyperthermia, no subjective hypothermia Eyes: no blurry vision, no xerophthalmia ENT: no sore throat, no nodules palpated in throat, no dysphagia, no odynophagia, no hoarseness Cardiovascular: no CP/no SOB/no palpitations/+ leg swelling Respiratory: no cough/no SOB/no wheezing Gastrointestinal:  no N/no V/no D/no C/no acid reflux Musculoskeletal: + muscle aches/no joint aches Skin: no rashes, no hair loss Neurological: no tremors/no numbness/no tingling/no dizziness  I reviewed pt's medications, allergies, PMH, social hx, family hx, and changes were documented in the history of present illness. Otherwise, unchanged from my initial visit note.  Past Medical History:  Diagnosis Date  . Arthritis   . Atrial fibrillation ([place])    h/o post-op 04/2010  . Breast cancer ([place])    x 3 cancer - radiation right x2 ''00,'05, left '07 mammosite radiation   . Diverticulosis   . 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  . Fatty liver 01/10/07  . GERD (gastroesophageal reflux disease)   . Headache    migraines   . 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 HTN (hypertension)   . Hyperlipidemia   . Hyperplastic colon polyp   . Hypothyroidism   . IBS (irritable bowel syndrome)   . Lower extremity neuropathy    occ. affects walking and balance"foot to knees"  . Obesity   . Renal cyst, left   . Thyroid cancer [facility])    Past Surgical History:  Procedure Laterality Date  . BREAST LUMPECTOMY  2000   Right x2, Left x1  . CHOLECYSTECTOMY    . COLONOSCOPY W/ POLYPECTOMY    . CORONARY ARTERY BYPASS GRAFT  05/03/2010   CABG X1, RCA -Dr Roxan Hockey (with type 1 aortic dissection)  . HYSTEROSCOPY W/D&C N/A 04/13/2014   Procedure: DILATATION AND CURETTAGE /HYSTEROSCOPY Polypectomy;  Surgeon: Maeola Sarah. Landry Mellow, MD;  Location: [facility];  Service: Gynecology;  Laterality: N/A;  Possible Polypectomy  . Median sternotomy, extracorporeal circulation, repair of type 1 aortic dissection with  tube graft from  sinotubular junction to take off of innominate artery, distal anastomoses under deep bypothermic circulatory arrest  05/03/2010   Dr Roxan Hockey  . ROBOTIC ASSISTED TOTAL HYSTERECTOMY WITH BILATERAL SALPINGO OOPHERECTOMY Bilateral 05/18/2014   Procedure: ROBOTIC ASSISTED  TOTAL HYSTERECTOMY WITH BILATERAL SALPINGO OOPHORECTOMY SENTINAL LYMPH NODE MAPPING ;  Surgeon: Everitt Amber, MD;  Location: [facility];  Service: Gynecology;  Laterality: Bilateral;  . THYROIDECTOMY N/A 01/09/2016   Procedure: TOTAL THYROIDECTOMY;  Surgeon: Armandina Gemma, MD;  Location: [facility];  Service: General;  Laterality: N/A;  . TONSILLECTOMY    . TOTAL THYROIDECTOMY  01/09/2016   Social History   Social History  . Marital status: Married    Spouse name: N/A  . Number of children: 6   Occupational History  . retired   Social History Main Topics  . Smoking status: Never Smoker  . Smokeless tobacco: Never Used  . Alcohol use No  . Drug use: No   Current Outpatient Prescriptions on File Prior to Visit  Medication Sig Dispense Refill  . acetaminophen (TYLENOL) 500 MG tablet Take 1,000 mg by mouth every 6 (six) hours as needed for moderate pain or headache.    Marland Kitchen aspirin 81 MG tablet Take 81 mg by mouth every morning.     . benazepril (LOTENSIN) 40 MG tablet Take 1 tablet (40 mg total) by mouth daily.  NEED OV. 90 tablet 0  . CALCIUM-MAGNESIUM-VITAMIN D PO Take 1 tablet by mouth 2 (two) times daily.     . fish oil-omega-3 fatty acids 1000 MG capsule Take 1 g by mouth 2 (two) times daily.     . hydrochlorothiazide (MICROZIDE) 12.5 MG capsule Take 2 capsules (25 mg total) by mouth daily. 180 capsule 3  . HYDROcodone-acetaminophen (NORCO/VICODIN) 5-325 MG tablet Take 1-2 tablets by mouth every 4 (four) hours as needed for moderate pain. 20 tablet 0  . hydrocortisone 2.5 % cream Apply 1 application topically daily as needed (groin irritation).   0  . ibuprofen (ADVIL,MOTRIN) 200 MG tablet Take 400 mg by mouth every 6 (six) hours as needed for headache or moderate pain.    Marland Kitchen ketoconazole (NIZORAL) 2 % cream Apply 1 application topically daily as needed for irritation (groin).   2  . levothyroxine (SYNTHROID, LEVOTHROID) 175 MCG tablet TAKE 1 TABLET (175 MCG TOTAL) BY MOUTH DAILY BEFORE BREAKFAST.  30 tablet 0  . LORazepam (ATIVAN) 1 MG tablet Take 1 tablet (1 mg total) by mouth as needed for anxiety. TAKE 1 TABLET BY MOUTH AS NEEDED( NOT ON A REGULAR BASIS) 30 tablet 0  . metoprolol (LOPRESSOR) 50 MG tablet Take 1 tablet (50 mg total) by mouth 2 (two) times daily. 180 tablet 3  . OVER THE COUNTER MEDICATION Take 1 tablet by mouth every morning. Product Name: EHT Mind enhancement formula Vitamin d 200 units Vitamin b 1.6 mg Vitamin b-12 10 mcg Selinum 70 mcg Coffee Extract 35 mg Alpha leporic acid 50 mg huperzine-a 50 mcg    . OVER THE COUNTER MEDICATION Take 1 tablet by mouth daily. Vitamin B12, Vitamin B1 and Alpha Leporic Acid    . Probiotic Product (PROBIOTIC PO) Take 1 capsule by mouth 2 (two) times daily.    Marland Kitchen amLODipine (NORVASC) 5 MG tablet TAKE 1 TABLET(5 MG) BY MOUTH DAILY (Patient not taking: Reported on 11/12/2016) 30 tablet 10  . calcium carbonate (TUMS) 500 MG chewable tablet Chew 2 tablets (400 mg of elemental calcium total) by mouth 3 (three) times daily. (Patient not taking: Reported on 05/15/2016) 90 tablet 1  . valACYclovir (VALTREX) 1000 MG tablet Take 1 tablet (1,000 mg total) by mouth 3 (three) times daily. (Patient not taking: Reported on 11/13/2016) 30 tablet 0  . [DISCONTINUED] amLODipine-benazepril (LOTREL) 5-20 MG per capsule Take 1 capsule by mouth daily.      . [DISCONTINUED] escitalopram (LEXAPRO) 10 MG tablet Take 10 mg by mouth daily.      . [DISCONTINUED] lisinopril (PRINIVIL,ZESTRIL) 20 MG tablet Take 20 mg by mouth daily.       No current facility-administered medications on file prior to visit.    No Known Allergies Family History  Problem Relation Age of Onset  . Aneurysm Father        CNS  . Heart attack Father        x2  . Alzheimer's disease Mother   . Hypertension Mother   . Goiter Mother   . Hypertension Brother   . Kidney disease Brother   . Colon cancer Neg Hx    PE: BP 140/84   Pulse (!) 48   Wt 254 lb 3.2 oz (115.3 kg)   SpO2 96%    BMI 43.63 kg/m  Wt Readings from Last 3 Encounters:  11/13/16 254 lb 3.2 oz (115.3 kg)  11/12/16 242 lb (109.8 kg)  05/15/16 251 lb (113.9 kg)   Constitutional: overweight, in NAD  Eyes: PERRLA, EOMI, no exophthalmos ENT: moist mucous membranes, Thyroidectomy scar healed, without dysesthesia, without keloid, no cervical lymphadenopathy Cardiovascular: Bradycardia, RR, No RG, + 1/6 SEM, + B LE swelling - pitting Respiratory: CTA B Gastrointestinal: abdomen soft, NT, ND, BS+ Musculoskeletal: no deformities, strength intact in all 4 Skin: moist, warm, no rashes Neurological: no tremor with outstretched hands, DTR normal in all 4  ASSESSMENT: 1. Thyroid cancer - see HPI  2. Postsurgical Hypothyroidism  PLAN:  1. Thyroid cancer - papillary - Pt with stage 1 TNM. The tumor was >1.5 cm, however, it had lympho-vascular invasion and it also contained Hurthle cell features >> I recommended radioactive iodine for post-op thyroid remnant ablation. I explained that the main role of this is to facilitate monitoring in the long run (by checking thyroglobulin) >> she had RAI tx with Thyrogen on 03/19/2016 (46.7 mCi). A post-tx WBS was negative for metastases. - we again discussed that her cancer is low risk, but we will still need to monitor her tumor marker, thyroglobulin - at last visit, a thyroglobulin was undetectable, but she had positive Tg antibodies >> will recheck with the [facility] assay today - We will check another thyroid ultrasound at next visit, approximately a year after the RAI treatment - I will see the patient back in 6 months  2. Patient with h/o total thyroidectomy for cancer, now with iatrogenic hypothyroidism, on levothyroxine therapy.  - latest thyroid labs reviewed with pt >> TSH still high, despite repeated increasing doses. She did not return for labs after the last increase in dose... - she continues on LT4 175 mcg daily - pt feels good on this dose. - we discussed about  taking the thyroid hormone every day, with water, >30 minutes before breakfast, separated by >4 hours from acid reflux medications, calcium, iron, multivitamins. Pt. is taking it correctly - will check thyroid tests today: TSH and fT4 - If labs are abnormal, she will need to return for repeat TFTs in 1.5 months - OTW, RTC in 6 mo  Office Visit on 11/13/2016  Component Date Value Ref Range Status  . Free T4 11/13/2016 1.26  0.60 - 1.60 ng/dL Final   Comment: Specimens from patients who are undergoing biotin therapy and /or ingesting biotin supplements may contain high levels of biotin.  The higher biotin concentration in these specimens interferes with this Free T4 assay.  Specimens that contain high levels  of biotin may cause false high results for this Free T4 assay.  Please interpret results in light of the total clinical presentation of the patient.    Marland Kitchen TSH 11/13/2016 3.54  0.35 - 4.50 uIU/mL Final  . Thyroglobulin Antibody 11/13/2016 <1.0  0.0 - 0.9 IU/mL Final   Thyroglobulin Antibody measured by [facility] Methodology  . Thyroglobulin by IMA 11/13/2016 <0.1* 1.5 - 38.5 ng/mL Final   Comment: According to the [facility] of Clinical Biochemistry, the reference interval for Thyroglobulin (TG) should be related to euthyroid patients and not for patients who underwent thyroidectomy. TG reference intervals for these patients depend on the residual mass of the thyroid tissue left after surgery. Establishing a post-operative baseline is recommended. The assay limit of quantitation is 0.1 ng/mL Thyroglobulin measured by [employer] Immunometric Assay    Excellent results!   CC: Dr Sallyanne Kuster  Philemon Kingdom, MD PhD [facility]

## 2016-11-13 NOTE — Patient Instructions (Signed)
Please stop at the lab.  Please continue Levothyroxine 175 mcg daily.  Take the thyroid hormone every day, with water, at least 30 minutes before breakfast, separated by at least 4 hours from: - acid reflux medications - calcium - iron - multivitamins  Please come back for a follow-up appointment in 6 months.

## 2016-11-14 LAB — T4, FREE: Free T4: 1.26 ng/dL (ref 0.60–1.60)

## 2016-11-14 LAB — THYROGLOBULIN BY IMA: Thyroglobulin by IMA: <0.1 ng/mL — ABNORMAL LOW (ref 1.5–38.5)

## 2016-11-14 LAB — TGAB+THYROGLOBULIN IMA OR LCMS: Thyroglobulin Antibody: <1 IU/mL (ref 0.0–0.9)

## 2016-11-19 ENCOUNTER — Ambulatory Visit (INDEPENDENT_AMBULATORY_CARE_PROVIDER_SITE_OTHER): Payer: Medicare Other | Admitting: Physical Medicine and Rehabilitation

## 2016-11-19 ENCOUNTER — Ambulatory Visit (INDEPENDENT_AMBULATORY_CARE_PROVIDER_SITE_OTHER): Payer: Medicare Other

## 2016-11-19 DIAGNOSIS — M25551 Pain in right hip: Secondary | ICD-10-CM | POA: Diagnosis not present

## 2016-11-19 NOTE — Patient Instructions (Signed)

## 2016-11-19 NOTE — Progress Notes (Deleted)
Right hip pain, groin and trochanter areas.  Taking tylenol for pain prn.

## 2016-11-19 NOTE — Progress Notes (Signed)
Breanna Hernandez - 71 y.o. female MRN [id number]  Date of birth: 02/02/1946  Office Visit Note: Visit Date: 11/19/2016 PCP: Ann Held, DO Referred by: Ann Held, *  Subjective: Chief Complaint  Patient presents with  . Right Hip - Pain   HPI: Breanna Hernandez is a very pleasant 71 year old female with right hip and groin pain which is worse with standing and ambulating. She has known arthritis of the right hip but also has progressive arthritis and degenerative changes of the lumbar spine. She recently saw Dr. Durward Fortes for evaluation and he felt like this may be more related to her hip and request diagnostic hip injection. She does have features of both with some lateral hip and thigh pain. She has not had a prior hip injection. We will complete diagnostic of therapeutic right hip anesthetic arthrogram.    ROS Otherwise per HPI.  Assessment & Plan: Visit Diagnoses:  1. Pain in right hip     Plan: Findings:  Diagnostic and therapeutic right hip anesthetic arthrogram. Patient did have some relief during the anesthetic phase.    Meds & Orders: No orders of the defined types were placed in this encounter.   Orders Placed This Encounter  Procedures  . Large Joint Injection/Arthrocentesis  . XR C-ARM NO REPORT    Follow-up: Return for Dr. Durward Fortes.   Procedures: Diagnostic and therapeutic anesthetic hip arthrogram Date/Time: 11/19/2016 9:57 AM Performed by: Magnus Sinning Authorized by: Magnus Sinning   Consent Given by:  Patient Site marked: the procedure site was marked   Timeout: prior to procedure the correct patient, procedure, and site was verified   Indications:  Pain and diagnostic evaluation Location:  Hip Site:  R hip joint Prep: patient was prepped and draped in usual sterile fashion   Needle Size:  22 G Approach:  Anterior Ultrasound Guidance: No   Fluoroscopic Guidance: No   Arthrogram: Yes   Medications:  3 mL bupivacaine 0.5 %; 80  mg triamcinolone acetonide 40 MG/ML Aspiration Attempted: Yes   Patient tolerance:  Patient tolerated the procedure well with no immediate complications  Arthrogram demonstrated excellent flow of contrast throughout the joint surface without extravasation or obvious defect.  The patient had relief of symptoms during the anesthetic phase of the injection.      No notes on file   Clinical History: No specialty comments available.  She reports that she has never smoked. She has never used smokeless tobacco. No results for input(s): HGBA1C, LABURIC in the last 8760 hours.  Objective:  VS:  HT:    WT:   BMI:     BP:   HR: bpm  TEMP: ( )  RESP:  Physical Exam  Musculoskeletal:  Patient ambulates with an antalgic gait to the right and somewhat of a Trendelenburg gait. She does have pain with rotation of the hip or external and internal. She does have some stiffness with lack of range of motion compared to the left.    Ortho Exam Imaging: No results found.  Past Medical/Family/Surgical/Social History: Medications & Allergies reviewed per EMR Patient Active Problem List   Diagnosis Date Noted  . Bilateral cataracts 08/10/2016  . Numerous moles 05/10/2016  . CAD (coronary artery disease) 11/16/2015  . Papillary thyroid carcinoma ([place]) 11/16/2015  . Aortic atherosclerosis ([place]) 11/16/2015  . Quality of life palliative care encounter 11/03/2015  . Elevated liver enzymes 10/06/2014  . Fasting hyperglycemia 08/17/2014  . Endometrial ca ([place]) 06/24/2014  . History of  endometrial cancer 04/26/2014  . Postmenopausal bleeding 04/13/2014  . Neuropathy 10/02/2013  . Exertional chest pain 08/09/2013  . Atrial fibrillation, postoperative only 06/25/2012  . Long term current use of anticoagulant therapy 06/25/2012  . History of aortic dissection   . Hyperlipidemia 05/27/2009  . METABOLIC SYNDROME X 78/58/8502  . NONSPEC ELEVATION OF LEVELS OF TRANSAMINASE/LDH 05/27/2009  . Hypothyroidism  03/12/2008  . BREAST CANCER, HX OF 03/12/2008  . COLONIC POLYPS, HX OF 03/12/2008  . Essential hypertension 05/09/2006  . Esophageal reflux 05/09/2006   Past Medical History:  Diagnosis Date  . Arthritis   . Atrial fibrillation ([place])    h/o post-op 04/2010  . Breast cancer ([place])    x 3 cancer - radiation right x2 ''00,'05, left '07 mammosite radiation   . Diverticulosis   . 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  . Fatty liver 01/10/07  . GERD (gastroesophageal reflux disease)   . Headache    migraines   . 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 HTN (hypertension)   . Hyperlipidemia   . Hyperplastic colon polyp   . Hypothyroidism   . IBS (irritable bowel syndrome)   . Lower extremity neuropathy    occ. affects walking and balance"foot to knees"  . Obesity   . Renal cyst, left   . Thyroid cancer ([place])    Family History  Problem Relation Age of Onset  . Aneurysm Father        CNS  . Heart attack Father        x2  . Alzheimer's disease Mother   . Hypertension Mother   . Goiter Mother   . Hypertension Brother   . Kidney disease Brother   . Colon cancer Neg Hx    Past Surgical History:  Procedure Laterality Date  . BREAST LUMPECTOMY  2000   Right x2, Left x1  . CHOLECYSTECTOMY    . COLONOSCOPY W/ POLYPECTOMY    . CORONARY ARTERY BYPASS GRAFT  05/03/2010   CABG X1, RCA -Dr Roxan Hockey (with type 1 aortic dissection)  . HYSTEROSCOPY W/D&C N/A 04/13/2014   Procedure: DILATATION AND CURETTAGE /HYSTEROSCOPY Polypectomy;  Surgeon: Maeola Sarah. Landry Mellow, MD;  Location: [facility];  Service: Gynecology;  Laterality: N/A;  Possible Polypectomy  . Median sternotomy, extracorporeal circulation, repair of type 1 aortic dissection with  tube graft from  sinotubular junction to take off of innominate artery, distal anastomoses under deep bypothermic circulatory arrest  05/03/2010   Dr Roxan Hockey  . ROBOTIC ASSISTED TOTAL HYSTERECTOMY WITH BILATERAL  SALPINGO OOPHERECTOMY Bilateral 05/18/2014   Procedure: ROBOTIC ASSISTED TOTAL HYSTERECTOMY WITH BILATERAL SALPINGO OOPHORECTOMY SENTINAL LYMPH NODE MAPPING ;  Surgeon: Everitt Amber, MD;  Location: [facility];  Service: Gynecology;  Laterality: Bilateral;  . THYROIDECTOMY N/A 01/09/2016   Procedure: TOTAL THYROIDECTOMY;  Surgeon: Armandina Gemma, MD;  Location: [facility];  Service: General;  Laterality: N/A;  . TONSILLECTOMY    . TOTAL THYROIDECTOMY  01/09/2016   Social History   Occupational History  . Not on file.   Social History Main Topics  . Smoking status: Never Smoker  . Smokeless tobacco: Never Used  . Alcohol use No  . Drug use: No  . Sexual activity: Yes

## 2016-11-19 NOTE — Progress Notes (Unsigned)
Fluoro Time: 17 sec Mgy:  20.80

## 2016-11-21 ENCOUNTER — Ambulatory Visit (INDEPENDENT_AMBULATORY_CARE_PROVIDER_SITE_OTHER): Payer: Medicare Other | Admitting: Physical Medicine and Rehabilitation

## 2016-11-21 ENCOUNTER — Telehealth (INDEPENDENT_AMBULATORY_CARE_PROVIDER_SITE_OTHER): Payer: Self-pay

## 2016-11-21 MED ORDER — TRIAMCINOLONE ACETONIDE 40 MG/ML IJ SUSP
80.0000 mg | INTRAMUSCULAR | Status: AC | PRN
  Administered 2016-11-19: 80 mg via INTRA_ARTICULAR

## 2016-11-21 MED ORDER — BUPIVACAINE HCL 0.5 % IJ SOLN
3.0000 mL | INTRAMUSCULAR | Status: AC | PRN
  Administered 2016-11-19: 3 mL via INTRA_ARTICULAR

## 2016-11-21 NOTE — Telephone Encounter (Signed)
Patient had called stating that she has an headache.  Patient had injection on Monday 11/19/16 with Dr. Ernestina Patches.  Advised patient per Dr. Ernestina Patches instructions that she is to drink plenty of water and lay down when possible and if not any better by Friday, to call the office. Patient understands.

## 2016-11-23 ENCOUNTER — Other Ambulatory Visit: Payer: Self-pay | Admitting: Cardiovascular Disease

## 2016-11-26 NOTE — Telephone Encounter (Signed)
Rx(s) sent to pharmacy electronically.  

## 2016-11-28 ENCOUNTER — Other Ambulatory Visit: Payer: Self-pay | Admitting: Internal Medicine

## 2016-11-28 ENCOUNTER — Ambulatory Visit
Admission: RE | Admit: 2016-11-28 | Discharge: 2016-11-28 | Disposition: A | Discharge status: Home / Self Care | Payer: Medicare Other | Source: Ambulatory Visit | Attending: Obstetrics and Gynecology | Admitting: Obstetrics and Gynecology

## 2016-11-28 DIAGNOSIS — Z1231 Encounter for screening mammogram for malignant neoplasm of breast: Secondary | ICD-10-CM

## 2016-12-07 ENCOUNTER — Other Ambulatory Visit: Payer: Self-pay | Admitting: Internal Medicine

## 2016-12-11 ENCOUNTER — Ambulatory Visit (INDEPENDENT_AMBULATORY_CARE_PROVIDER_SITE_OTHER): Payer: Medicare Other | Admitting: Orthopaedic Surgery

## 2016-12-11 ENCOUNTER — Other Ambulatory Visit: Payer: Self-pay | Admitting: Cardiovascular Disease

## 2016-12-11 ENCOUNTER — Other Ambulatory Visit (INDEPENDENT_AMBULATORY_CARE_PROVIDER_SITE_OTHER): Payer: Self-pay

## 2016-12-11 ENCOUNTER — Encounter (INDEPENDENT_AMBULATORY_CARE_PROVIDER_SITE_OTHER): Payer: Self-pay | Admitting: Orthopaedic Surgery

## 2016-12-11 VITALS — BP 133/73 | HR 59 | Resp 14 | Ht 64.0 in | Wt 254.0 lb

## 2016-12-11 DIAGNOSIS — M25551 Pain in right hip: Secondary | ICD-10-CM | POA: Insufficient documentation

## 2016-12-11 DIAGNOSIS — M1611 Unilateral primary osteoarthritis, right hip: Secondary | ICD-10-CM | POA: Insufficient documentation

## 2016-12-11 MED ORDER — CELECOXIB 200 MG PO CAPS: 200.0000 mg | ORAL_CAPSULE | Freq: Every day | ORAL | 0 refills | Status: DC

## 2016-12-11 NOTE — Progress Notes (Signed)
Office Visit Note   Patient: Breanna Hernandez           Date of Birth: 02-Jul-1945           MRN: [id number] Visit Date: 12/11/2016              Requested by: [address], [place], [place] [address] [place], [place] [address] PCP: Carollee Herter, Alferd Apa, DO   Assessment & Plan: Visit Diagnoses:  1. Pain of right hip joint   2. Unilateral primary osteoarthritis, right hip     Plan: Response to intra-articular cortisone injection right hip. She did have a headache and some flushing for up to a week. Long discussion regarding her diagnosis and treatment options. She does have difficulty with NSAIDs with stomach upset so we'll try Celebrex. Also encourage her to work on her weight and exercise and we'll plan to see her back in a month. Her BMI is 43. Discussed potential complications with increased weight and poor condition for hip replacement. Further discussion regarding total hip replacement including hospitalization, rehabilitation, compromise of her activities and potential complications.  Follow-Up Instructions: Return in about 1 month (around 01/10/2017).   Orders:  Hernandez orders of the defined types were placed in this encounter.  Hernandez orders of the defined types were placed in this encounter.     Procedures: Hernandez procedures performed   Clinical Data: Hernandez additional findings.   Subjective: Chief Complaint  Patient presents with  . Right Hip - Pain    Breanna Hernandez is a 71 y o that presents with chronic R hip pain.She went to have injection from Dr. Ernestina Patches 8/13 and lasted only 2 weeks.  Breanna Hernandez had a difficult time with the aunt intra-articular cortisone injection of her right hip. She had a headache for at least a week and some flushing. She still having a fair amount of trouble to the point of compromise. She's having difficulty walking and even some trouble sleeping. She's had difficulty with NSAIDs in the past with stomach upset. Does have a history of nondiabetic  neuropathy  HPI  Review of Systems  Constitutional: Negative for chills, fatigue and fever.  Eyes: Negative for itching.  Respiratory: Negative for chest tightness and shortness of breath.   Cardiovascular: Negative for chest pain, palpitations and leg swelling.  Gastrointestinal: Positive for constipation and diarrhea. Negative for blood in stool.  Musculoskeletal: Positive for neck stiffness. Negative for back pain, joint swelling and neck pain.  Neurological: Negative for dizziness, weakness, numbness and headaches.  Hematological: Does not bruise/bleed easily.  Psychiatric/Behavioral: Negative for sleep disturbance. The patient is not nervous/anxious.      Objective: Vital Signs: BP 133/73   Pulse (!) 59   Resp 14   Ht 5\' 4"  (1.626 m)   Wt 254 lb (115.2 kg)   BMI 43.60 kg/m   Physical Exam  Ortho Exam painful range of motion right hip with internal and external rotation. Motion is decreased compared to the left side. Skin intact. Large abdominal panniculus. BMI 43. Altered sensibility in both of her feet related to her neuropathy. +1 pulses. Hernandez percussible tenderness lumbar spine. Straight leg raise negative.  Specialty Comments:  Hernandez specialty comments available.  Imaging: Hernandez results found.   PMFS History: Patient Active Problem List   Diagnosis Date Noted  . Unilateral primary osteoarthritis, right hip 12/11/2016  . Pain of right hip joint 12/11/2016  . Bilateral cataracts 08/10/2016  . Numerous moles 05/10/2016  .  CAD (coronary artery disease) 11/16/2015  . Papillary thyroid carcinoma ([place]) 11/16/2015  . Aortic atherosclerosis ([place]) 11/16/2015  . Quality of life palliative care encounter 11/03/2015  . Elevated liver enzymes 10/06/2014  . Fasting hyperglycemia 08/17/2014  . Endometrial ca ([place]) 06/24/2014  . History of endometrial cancer 04/26/2014  . Postmenopausal bleeding 04/13/2014  . Neuropathy 10/02/2013  . Exertional chest pain 08/09/2013  . Atrial  fibrillation, postoperative only 06/25/2012  . Long term current use of anticoagulant therapy 06/25/2012  . History of aortic dissection   . Hyperlipidemia 05/27/2009  . METABOLIC SYNDROME X 34/74/2595  . NONSPEC ELEVATION OF LEVELS OF TRANSAMINASE/LDH 05/27/2009  . Hypothyroidism 03/12/2008  . BREAST CANCER, HX OF 03/12/2008  . COLONIC POLYPS, HX OF 03/12/2008  . Essential hypertension 05/09/2006  . Esophageal reflux 05/09/2006   Past Medical History:  Diagnosis Date  . Arthritis   . Atrial fibrillation ([place])    h/o post-op 04/2010  . Breast cancer ([facility])    x 3 cancer - radiation right x2 ''00,'05, left '07 mammosite radiation   . Diverticulosis   . 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  . Fatty liver 01/10/07  . GERD (gastroesophageal reflux disease)   . Headache    migraines   . 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 HTN (hypertension)   . Hyperlipidemia   . Hyperplastic colon polyp   . Hypothyroidism   . IBS (irritable bowel syndrome)   . Lower extremity neuropathy    occ. affects walking and balance"foot to knees"  . Obesity   . Renal cyst, left   . Thyroid cancer ([facility])     Family History  Problem Relation Age of Onset  . Aneurysm Father        CNS  . Heart attack Father        x2  . Alzheimer's disease Mother   . Hypertension Mother   . Goiter Mother   . Hypertension Brother   . Kidney disease Brother   . Colon cancer Neg Hx     Past Surgical History:  Procedure Laterality Date  . BREAST LUMPECTOMY  2000   Right x2, Left x1  . CHOLECYSTECTOMY    . COLONOSCOPY W/ POLYPECTOMY    . CORONARY ARTERY BYPASS GRAFT  05/03/2010   CABG X1, RCA -Dr Roxan Hockey (with type 1 aortic dissection)  . HYSTEROSCOPY W/D&C N/A 04/13/2014   Procedure: DILATATION AND CURETTAGE /HYSTEROSCOPY Polypectomy;  Surgeon: Maeola Sarah. Landry Mellow, MD;  Location: [facility];  Service: Gynecology;  Laterality: N/A;  Possible Polypectomy  . Median  sternotomy, extracorporeal circulation, repair of type 1 aortic dissection with  tube graft from  sinotubular junction to take off of innominate artery, distal anastomoses under deep bypothermic circulatory arrest  05/03/2010   Dr Roxan Hockey  . ROBOTIC ASSISTED TOTAL HYSTERECTOMY WITH BILATERAL SALPINGO OOPHERECTOMY Bilateral 05/18/2014   Procedure: ROBOTIC ASSISTED TOTAL HYSTERECTOMY WITH BILATERAL SALPINGO OOPHORECTOMY SENTINAL LYMPH NODE MAPPING ;  Surgeon: Everitt Amber, MD;  Location: [facility];  Service: Gynecology;  Laterality: Bilateral;  . THYROIDECTOMY N/A 01/09/2016   Procedure: TOTAL THYROIDECTOMY;  Surgeon: Armandina Gemma, MD;  Location: [facility];  Service: General;  Laterality: N/A;  . TONSILLECTOMY    . TOTAL THYROIDECTOMY  01/09/2016   Social History   Occupational History  . Not on file.   Social History Main Topics  . Smoking status: Never Smoker  . Smokeless tobacco: Never Used  . Alcohol  use Hernandez  . Drug use: Hernandez  . Sexual activity: Yes

## 2016-12-11 NOTE — Telephone Encounter (Signed)
REFILL 

## 2016-12-13 ENCOUNTER — Telehealth: Payer: Self-pay | Admitting: Cardiovascular Disease

## 2016-12-13 NOTE — Telephone Encounter (Signed)
New message      Pt c/o medication issue:  1. Name of Medication: celebrex 2. How are you currently taking this medication (dosage and times per day)?  200mg  daily 3. Are you having a reaction (difficulty breathing--STAT)? no 4. What is your medication issue?Dr Durward Fortes prescribed this medication for hip pain.  Is it ok to take with her other heart medications?

## 2016-12-13 NOTE — Telephone Encounter (Signed)
Okay to take for short period of time only. If medication needed for >2 weeks, please discuss other options with Dr Durward Fortes

## 2016-12-13 NOTE — Telephone Encounter (Signed)
Pt notified she will call back with any other questions

## 2016-12-14 ENCOUNTER — Telehealth: Payer: Self-pay | Admitting: Cardiovascular Disease

## 2016-12-14 NOTE — Telephone Encounter (Signed)
Patient aware and verbalized understanding. °

## 2016-12-14 NOTE — Telephone Encounter (Signed)
New message     Pt c/o medication issue:  1. Name of Medication:  Celebrex   2. How are you currently taking this medication (dosage and times per day)?  Has not started it yet   3. Are you having a reaction (difficulty breathing--STAT)?  No   4. What is your medication issue? Should she continue baby aspirin with the Celebrex

## 2016-12-14 NOTE — Telephone Encounter (Signed)
Do not stop baby aspirin while on Celebrex

## 2016-12-17 ENCOUNTER — Encounter: Payer: Medicare Other | Admitting: Adult Health

## 2016-12-20 ENCOUNTER — Other Ambulatory Visit: Payer: Self-pay | Admitting: Cardiovascular Disease

## 2016-12-20 MED ORDER — METOPROLOL TARTRATE 50 MG PO TABS: 50.0000 mg | ORAL_TABLET | Freq: Two times a day (BID) | ORAL | 0 refills | Status: DC

## 2016-12-20 NOTE — Telephone Encounter (Signed)
Rx(s) sent to pharmacy electronically.  

## 2016-12-21 ENCOUNTER — Other Ambulatory Visit: Payer: Self-pay

## 2016-12-21 MED ORDER — METOPROLOL TARTRATE 50 MG PO TABS: 50.0000 mg | ORAL_TABLET | Freq: Two times a day (BID) | ORAL | 0 refills | Status: DC

## 2016-12-28 ENCOUNTER — Telehealth (INDEPENDENT_AMBULATORY_CARE_PROVIDER_SITE_OTHER): Payer: Self-pay | Admitting: Orthopaedic Surgery

## 2016-12-28 NOTE — Telephone Encounter (Signed)
Patient states hip pain has gotten worse since she started Celebrex. Patient wants to see if something else can be prescribed to help. Patient uses [facility] in [place]. Please call patient to advise.

## 2016-12-28 NOTE — Telephone Encounter (Signed)
Please advise 

## 2016-12-31 ENCOUNTER — Other Ambulatory Visit (INDEPENDENT_AMBULATORY_CARE_PROVIDER_SITE_OTHER): Payer: Self-pay

## 2016-12-31 DIAGNOSIS — M25551 Pain in right hip: Secondary | ICD-10-CM

## 2016-12-31 MED ORDER — TRAMADOL HCL 50 MG PO TABS: ORAL_TABLET | ORAL | 0 refills | Status: DC

## 2016-12-31 NOTE — Telephone Encounter (Signed)
Called and told her to discontinue Celebrex and sent rx for Tramadol, per PW

## 2016-12-31 NOTE — Telephone Encounter (Signed)
Can try tramadol-no other suggestions for Nsaids-please call

## 2016-12-31 NOTE — Telephone Encounter (Signed)
Patient called back in reference to hip pain. Please call patient to advise.

## 2017-01-04 ENCOUNTER — Telehealth: Payer: Self-pay | Admitting: Rheumatology

## 2017-01-04 NOTE — Telephone Encounter (Signed)
Patient left a message at 1pm stating Tramadol that was called in has not helped hip pain. Pain has actually gotten worse since she started it. Patient thinks she needs to be seen sooner than her scheduled appt. Please call patient to advise.

## 2017-01-04 NOTE — Telephone Encounter (Signed)
Dr's schedule opened for Tuesday, so I called patient and moved appt up to Tuesday 10/2. Advised patient doctor was not in office today. Patient questioned if there was anything doctor would prescribe other than Tramadol before she comes back for appt.

## 2017-01-04 NOTE — Telephone Encounter (Signed)
Patient has appointment on Tuesday, she states when she started the Tramadol she d/c the Celebrex Dr Durward Fortes gave her, I have told her she can use them together, and to try ice/ heat alternating to her hip to see if this is helpful.

## 2017-01-08 ENCOUNTER — Encounter (INDEPENDENT_AMBULATORY_CARE_PROVIDER_SITE_OTHER): Payer: Self-pay | Admitting: Orthopaedic Surgery

## 2017-01-08 ENCOUNTER — Ambulatory Visit (INDEPENDENT_AMBULATORY_CARE_PROVIDER_SITE_OTHER): Payer: Medicare Other | Admitting: Orthopaedic Surgery

## 2017-01-08 VITALS — BP 122/72 | HR 72 | Resp 16 | Ht 64.0 in | Wt 265.0 lb

## 2017-01-08 DIAGNOSIS — M1611 Unilateral primary osteoarthritis, right hip: Secondary | ICD-10-CM | POA: Diagnosis not present

## 2017-01-08 NOTE — Progress Notes (Signed)
Office Visit Note   Patient: Breanna Hernandez           Date of Birth: December 01, 1945           MRN: [id number] Visit Date: 01/08/2017              Requested by: [address], [place], [place] [address] [place], [place] [address] PCP: Breanna Hernandez, Breanna Hernandez   Assessment & Plan: Visit Diagnoses:  1. Unilateral primary osteoarthritis, right hip     Plan: Long discussion with Mr. Mrs. care of it. She has not had a good response to intra-articular cortisone or the NSAIDs. She's been using a walker and continues to have pain to the point of compromise. She does have evidence of some significant osteoarthritis of her right hip and I think at this point is worth considering hip replacement. Given that she is overweight with a BMI of 45 she may be a candidate for an anterior approach to the hip. I'll consult Breanna Hernandez and see what he thinks. Total time spent 30 minutes predominantly in counseling regarding her diagnosis and treatment options  Follow-Up Instructions: Return consult Dr Ninfa Hernandez for consideration of ant THR.   Orders:  No orders of the defined types were placed in this encounter.  No orders of the defined types were placed in this encounter.     Procedures: No procedures performed   Clinical Data: No additional findings.   Subjective: Chief Complaint  Patient presents with  . Right Hip - Pain    Breanna Hernandez is a 71 y o that presents with chronic Right hip pain getting worse, now she ambulates with a walker.  Present pain is localized in the area of the right hip. There is some buttock pain and lateral hip discomfort even groin pain. She is now using a walker. She had a prior history of sciatica several years ago but this present pain  is "not feeling  like that". When she was experiencing sciatica she was having pain along the entire right leg. Her pain now is more localized. She's not having any numbness or tingling. No fever or chills. No recent injury or  trauma  HPI  Review of Systems  Constitutional: Negative for chills, fatigue and fever.  Eyes: Negative for itching.  Respiratory: Negative for chest tightness and shortness of breath.   Cardiovascular: Positive for leg swelling. Negative for chest pain and palpitations.  Gastrointestinal: Negative for blood in stool, constipation and diarrhea.  Musculoskeletal: Positive for myalgias. Negative for back pain, joint swelling, neck pain and neck stiffness.  Neurological: Negative for dizziness, weakness, numbness and headaches.  Hematological: Does not bruise/bleed easily.  Psychiatric/Behavioral: Negative for sleep disturbance. The patient is not nervous/anxious.      Objective: Vital Signs: BP 122/72   Pulse 72   Resp 16   Ht 5\' 4"  (1.626 m)   Wt 265 lb (120.2 kg)   BMI 45.49 kg/m   Physical Exam  Ortho Exam awake alert and oriented 3 comfortable sitting. Straight leg raise negative. Considerable pain with internal/external rotation of her right hip compared to left. She only had about 5 of internal rotation on the right hip and approximately 30 of external rotation. Some mild pitting edema both ankles. Large legs.  Specialty Comments:  No specialty comments available.  Imaging: No results found.   PMFS History: Patient Active Problem List   Diagnosis Date Noted  . Unilateral primary osteoarthritis, right hip 12/11/2016  .  Pain of right hip joint 12/11/2016  . Bilateral cataracts 08/10/2016  . Numerous moles 05/10/2016  . CAD (coronary artery disease) 11/16/2015  . Papillary thyroid carcinoma ([facility]) 11/16/2015  . Aortic atherosclerosis ([place] [place]) 11/16/2015  . Quality of life palliative care encounter 11/03/2015  . Elevated liver enzymes 10/06/2014  . Fasting hyperglycemia 08/17/2014  . Endometrial ca ([place]) 06/24/2014  . History of endometrial cancer 04/26/2014  . Postmenopausal bleeding 04/13/2014  . Neuropathy 10/02/2013  . Exertional chest pain 08/09/2013  .  Atrial fibrillation, postoperative only 06/25/2012  . Long term current use of anticoagulant therapy 06/25/2012  . History of aortic dissection   . Hyperlipidemia 05/27/2009  . METABOLIC SYNDROME X 82/42/3536  . NONSPEC ELEVATION OF LEVELS OF TRANSAMINASE/LDH 05/27/2009  . Hypothyroidism 03/12/2008  . BREAST CANCER, HX OF 03/12/2008  . COLONIC POLYPS, HX OF 03/12/2008  . Essential hypertension 05/09/2006  . Esophageal reflux 05/09/2006   Past Medical History:  Diagnosis Date  . Arthritis   . Atrial fibrillation ([place])    h/o post-op 04/2010  . Breast cancer ([place])    x 3 cancer - radiation right x2 ''00,'05, left '07 mammosite radiation   . Diverticulosis   . 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  . Fatty liver 01/10/07  . GERD (gastroesophageal reflux disease)   . Headache    migraines   . 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 HTN (hypertension)   . Hyperlipidemia   . Hyperplastic colon polyp   . Hypothyroidism   . IBS (irritable bowel syndrome)   . Lower extremity neuropathy    occ. affects walking and balance"foot to knees"  . Obesity   . Renal cyst, left   . Thyroid cancer ([facility])     Family History  Problem Relation Age of Onset  . Aneurysm Father        CNS  . Heart attack Father        x2  . Alzheimer's disease Mother   . Hypertension Mother   . Goiter Mother   . Hypertension Brother   . Kidney disease Brother   . Colon cancer Neg Hx     Past Surgical History:  Procedure Laterality Date  . BREAST LUMPECTOMY  2000   Right x2, Left x1  . CHOLECYSTECTOMY    . COLONOSCOPY W/ POLYPECTOMY    . CORONARY ARTERY BYPASS GRAFT  05/03/2010   CABG X1, RCA -Dr Roxan Hockey (with type 1 aortic dissection)  . HYSTEROSCOPY W/D&C N/A 04/13/2014   Procedure: DILATATION AND CURETTAGE /HYSTEROSCOPY Polypectomy;  Surgeon: Maeola Sarah. Landry Mellow, MD;  Location: [facility];  Service: Gynecology;  Laterality: N/A;  Possible Polypectomy  . Median  sternotomy, extracorporeal circulation, repair of type 1 aortic dissection with  tube graft from  sinotubular junction to take off of innominate artery, distal anastomoses under deep bypothermic circulatory arrest  05/03/2010   Dr Roxan Hockey  . ROBOTIC ASSISTED TOTAL HYSTERECTOMY WITH BILATERAL SALPINGO OOPHERECTOMY Bilateral 05/18/2014   Procedure: ROBOTIC ASSISTED TOTAL HYSTERECTOMY WITH BILATERAL SALPINGO OOPHORECTOMY SENTINAL LYMPH NODE MAPPING ;  Surgeon: Everitt Amber, MD;  Location: [facility];  Service: Gynecology;  Laterality: Bilateral;  . THYROIDECTOMY N/A 01/09/2016   Procedure: TOTAL THYROIDECTOMY;  Surgeon: Armandina Gemma, MD;  Location: [facility];  Service: General;  Laterality: N/A;  . TONSILLECTOMY    . TOTAL THYROIDECTOMY  01/09/2016   Social History   Occupational History  . Not on file.   Social  History Main Topics  . Smoking status: Never Smoker  . Smokeless tobacco: Never Used  . Alcohol use No  . Drug use: No  . Sexual activity: Yes

## 2017-01-11 ENCOUNTER — Ambulatory Visit (INDEPENDENT_AMBULATORY_CARE_PROVIDER_SITE_OTHER): Payer: Medicare Other | Admitting: Orthopaedic Surgery

## 2017-01-15 ENCOUNTER — Ambulatory Visit (INDEPENDENT_AMBULATORY_CARE_PROVIDER_SITE_OTHER): Payer: Medicare Other | Admitting: Orthopaedic Surgery

## 2017-01-15 DIAGNOSIS — M1611 Unilateral primary osteoarthritis, right hip: Secondary | ICD-10-CM | POA: Diagnosis not present

## 2017-01-15 NOTE — Progress Notes (Signed)
The patient is sent to me to evaluate her for anterior total hip arthroplasty the right hip. She is a patient of Dr. Durward Fortes in his had an intra-articular injection by Dr. Ernestina Patches in that right hip with a steroid that helped her significantly for only about a week. Her pain is daily and is 10 out of 10. It is painful all around the hip with range of motion and weightbearing. She simply with a walker and going slow. She has x-rays from interview today that do show significant joint space narrowing and degenerative joint disease of the right hip. She also has severe lumbar degenerative disc disease at multiple levels especially at L3-L4.  On exam any attempts of her hip through internal rotation rotation causes severe pain on the right side. Her left hip exam is normal. I did review with her x-rays and shows the significant joint space narrowing on the right hip (the right left specialty of the articular weightbearing surface. There particular osteophytes as well. We talked about Dr. Romona Curls injection as well and how well that worked for her for short period time.  I showed her hip modeling spent in detail what hip replaced surgery through direct approach involves. I let her supine on the table second see where soft tissue plane would be and I explained to her in detail the risk and benefits of the surgery and what it involves performing anterior hip replacement surgery with the biggest risk being soft tissue issues. I gave her handout on hip replaced surgery as well. She does wish to have this set up in the near future and I do feel comfortable performing the surgery. It may not treat all of her back pain but certainly could help for posture standpoint as well. All questions been surgery answered and addressed. We'll work on getting the surgery scheduled.

## 2017-01-21 ENCOUNTER — Other Ambulatory Visit: Payer: Self-pay | Admitting: Cardiovascular Disease

## 2017-01-21 NOTE — Telephone Encounter (Signed)
Rx(s) sent to pharmacy electronically.  

## 2017-01-23 ENCOUNTER — Other Ambulatory Visit (INDEPENDENT_AMBULATORY_CARE_PROVIDER_SITE_OTHER): Payer: Self-pay

## 2017-01-23 NOTE — Progress Notes (Signed)
Please place orders in EPIC as patient is being scheduled for a pre-op appointment! Thank you! 

## 2017-01-24 ENCOUNTER — Other Ambulatory Visit (INDEPENDENT_AMBULATORY_CARE_PROVIDER_SITE_OTHER): Payer: Self-pay | Admitting: Physician Assistant

## 2017-01-25 ENCOUNTER — Encounter (INDEPENDENT_AMBULATORY_CARE_PROVIDER_SITE_OTHER): Payer: Self-pay | Admitting: Family

## 2017-01-25 ENCOUNTER — Telehealth (INDEPENDENT_AMBULATORY_CARE_PROVIDER_SITE_OTHER): Payer: Self-pay | Admitting: Radiology

## 2017-01-25 ENCOUNTER — Telehealth (INDEPENDENT_AMBULATORY_CARE_PROVIDER_SITE_OTHER): Payer: Self-pay

## 2017-01-25 ENCOUNTER — Ambulatory Visit (INDEPENDENT_AMBULATORY_CARE_PROVIDER_SITE_OTHER): Payer: Medicare Other | Admitting: Family

## 2017-01-25 ENCOUNTER — Ambulatory Visit (HOSPITAL_COMMUNITY)
Admission: RE | Admit: 2017-01-25 | Discharge: 2017-01-25 | Disposition: A | Discharge status: Home / Self Care | Payer: Medicare Other | Source: Ambulatory Visit | Attending: Family | Admitting: Family

## 2017-01-25 ENCOUNTER — Other Ambulatory Visit (INDEPENDENT_AMBULATORY_CARE_PROVIDER_SITE_OTHER): Payer: Self-pay

## 2017-01-25 DIAGNOSIS — M79609 Pain in unspecified limb: Secondary | ICD-10-CM

## 2017-01-25 DIAGNOSIS — M7989 Other specified soft tissue disorders: Secondary | ICD-10-CM | POA: Diagnosis not present

## 2017-01-25 DIAGNOSIS — M25451 Effusion, right hip: Secondary | ICD-10-CM

## 2017-01-25 DIAGNOSIS — M79651 Pain in right thigh: Secondary | ICD-10-CM

## 2017-01-25 DIAGNOSIS — M25551 Pain in right hip: Secondary | ICD-10-CM

## 2017-01-25 MED ORDER — CELECOXIB 200 MG PO CAPS: 200.0000 mg | ORAL_CAPSULE | Freq: Two times a day (BID) | ORAL | 3 refills | Status: DC

## 2017-01-25 NOTE — Telephone Encounter (Signed)
Ok, 1 po BID prn, #60, 3 refills

## 2017-01-25 NOTE — Telephone Encounter (Signed)
Vascular lab calling with report of Right leg doppler. Negative for DVT. She did a a superfical clot of greater saphenous vein. I did speak with Dr. Ninfa Linden about this over the phone, who advised patient to be on aspirin 325mg  1 po daily. She will take until surgery next Friday. Results will not interfere with any surgical intervention. No further intervention needed besides aspirin. Advised patients husband of report and aspirin. He expressed understanding.

## 2017-01-25 NOTE — Progress Notes (Signed)
Office Visit Note   Patient: Breanna Hernandez           Date of Birth: September 10, 1945           MRN: [id number] Visit Date: 01/25/2017              Requested by: [address], [address] [place], [place] [address] PCP: Carollee Herter, Alferd Apa, DO  Chief Complaint  Patient presents with  . Right Thigh - Pain      HPI: The patient is a 71 year old woman seen today for evaluation of right thigh knot. Is scheduled for R THA next Friday with Ninfa Linden. Is very concerned for DVT. Woke this am with swelling and tender area to medial thigh. Will be leaving town this evening for the weekend.   No history of DVT. No numbness or tingling. No change in temperature of foot or LE. Mild increase of pain with ambulation. No injury.  Assessment & Plan: Visit Diagnoses:  1. Swelling of joint of pelvic region or thigh, right   2. Pain in right thigh     Plan: will have her go to [facility] this afternoon for [place] r/o DVT. Will call her with results.   Follow-Up Instructions: No Follow-up on file.   Ortho Exam  Patient is alert, oriented, no adenopathy, well-dressed, normal affect, normal respiratory effort. On examination of right thigh to medial aspect has lime sized swollen area with mild tenderness. Mild erythema. No open ulcers or break down of skin. No insect bite. No palpable abscess or cysts. No induration. Does have palpable DP pulse on right. Foot is normothermic.   Imaging: No results found. No images are attached to the encounter.  Labs: Lab Results  Component Value Date   HGBA1C 5.8 08/17/2014   HGBA1C 5.8 09/22/2013   HGBA1C (H) 05/11/2010    5.7 (NOTE)                                                                       According to the ADA Clinical Practice Recommendations for 2011, when HbA1c is used as a screening test:   >=6.5%   Diagnostic of Diabetes Mellitus           (if abnormal result  is confirmed)  5.7-6.4%   Increased risk of developing Diabetes  [place]  References:Diagnosis and Classification of Diabetes Mellitus,Diabetes YIFO,2774,12(INOMV 1):S62-S69 and Standards of Medical Care in         Diabetes - 2011,Diabetes Care,2011,34  (Suppl 1):S11-S61.    Orders:  No orders of the defined types were placed in this encounter.  No orders of the defined types were placed in this encounter.    Procedures: No procedures performed  Clinical Data: No additional findings.  ROS:  All other systems negative, except as noted in the HPI. Review of Systems  Constitutional: Negative for chills and fever.  Cardiovascular: Positive for leg swelling.  Skin: Positive for color change. Negative for rash and wound.  Neurological: Negative for weakness and numbness.    Objective: Vital Signs: There were no vitals taken for this visit.  Specialty Comments:  No specialty comments available.  PMFS History: Patient Active Problem List   Diagnosis  Date Noted  . Unilateral primary osteoarthritis, right hip 12/11/2016  . Pain of right hip joint 12/11/2016  . Bilateral cataracts 08/10/2016  . Numerous moles 05/10/2016  . CAD (coronary artery disease) 11/16/2015  . Papillary thyroid carcinoma ([place]) 11/16/2015  . Aortic atherosclerosis ([place]) 11/16/2015  . Quality of life palliative care encounter 11/03/2015  . Elevated liver enzymes 10/06/2014  . Fasting hyperglycemia 08/17/2014  . Endometrial ca ([place]) 06/24/2014  . History of endometrial cancer 04/26/2014  . Postmenopausal bleeding 04/13/2014  . Neuropathy 10/02/2013  . Exertional chest pain 08/09/2013  . Atrial fibrillation, postoperative only 06/25/2012  . Long term current use of anticoagulant therapy 06/25/2012  . History of aortic dissection   . Hyperlipidemia 05/27/2009  . METABOLIC SYNDROME X 60/63/0160  . NONSPEC ELEVATION OF LEVELS OF TRANSAMINASE/LDH 05/27/2009  . Hypothyroidism 03/12/2008  . BREAST CANCER, HX OF 03/12/2008  . COLONIC POLYPS, HX OF 03/12/2008  .  Essential hypertension 05/09/2006  . Esophageal reflux 05/09/2006   Past Medical History:  Diagnosis Date  . Arthritis   . Atrial fibrillation ([place])    h/o post-op 04/2010  . Breast cancer ([place])    x 3 cancer - radiation right x2 ''00,'05, left '07 mammosite radiation   . Diverticulosis   . 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  . Fatty liver 01/10/07  . GERD (gastroesophageal reflux disease)   . Headache    migraines   . 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 HTN (hypertension)   . Hyperlipidemia   . Hyperplastic colon polyp   . Hypothyroidism   . IBS (irritable bowel syndrome)   . Lower extremity neuropathy    occ. affects walking and balance"foot to knees"  . Obesity   . Renal cyst, left   . Thyroid cancer ([place])     Family History  Problem Relation Age of Onset  . Aneurysm Father        CNS  . Heart attack Father        x2  . Alzheimer's disease Mother   . Hypertension Mother   . Goiter Mother   . Hypertension Brother   . Kidney disease Brother   . Colon cancer Neg Hx     Past Surgical History:  Procedure Laterality Date  . BREAST LUMPECTOMY  2000   Right x2, Left x1  . CHOLECYSTECTOMY    . COLONOSCOPY W/ POLYPECTOMY    . CORONARY ARTERY BYPASS GRAFT  05/03/2010   CABG X1, RCA -Dr Roxan Hockey (with type 1 aortic dissection)  . HYSTEROSCOPY W/D&C N/A 04/13/2014   Procedure: DILATATION AND CURETTAGE /HYSTEROSCOPY Polypectomy;  Surgeon: Maeola Sarah. Landry Mellow, MD;  Location: [facility];  Service: Gynecology;  Laterality: N/A;  Possible Polypectomy  . Median sternotomy, extracorporeal circulation, repair of type 1 aortic dissection with  tube graft from  sinotubular junction to take off of innominate artery, distal anastomoses under deep bypothermic circulatory arrest  05/03/2010   Dr Roxan Hockey  . ROBOTIC ASSISTED TOTAL HYSTERECTOMY WITH BILATERAL SALPINGO OOPHERECTOMY Bilateral 05/18/2014   Procedure: ROBOTIC ASSISTED TOTAL  HYSTERECTOMY WITH BILATERAL SALPINGO OOPHORECTOMY SENTINAL LYMPH NODE MAPPING ;  Surgeon: Everitt Amber, MD;  Location: [facility];  Service: Gynecology;  Laterality: Bilateral;  . THYROIDECTOMY N/A 01/09/2016   Procedure: TOTAL THYROIDECTOMY;  Surgeon: Armandina Gemma, MD;  Location: [facility];  Service: General;  Laterality: N/A;  . TONSILLECTOMY    . TOTAL THYROIDECTOMY  01/09/2016   Social History  Occupational History  . Not on file.   Social History Main Topics  . Smoking status: Never Smoker  . Smokeless tobacco: Never Used  . Alcohol use No  . Drug use: No  . Sexual activity: Yes

## 2017-01-25 NOTE — Progress Notes (Signed)
*  PRELIMINARY RESULTS* Vascular Ultrasound Right lower ext venous duplex has been completed.  Preliminary findings: No evidence of DVT. Findings consistent with acute superficial thrombosis involving the right greater saphenous vein, extending from the distal thigh to groin at the level of the common femoral vein, but not into the deep system.    Called results to [facility], she will let MD know and they will contact pt. Pt ok to leave.   Landry Mellow, RDMS, RVT  01/25/2017, 3:47 PM

## 2017-01-25 NOTE — Telephone Encounter (Signed)
Sent to pharmacy 

## 2017-01-25 NOTE — Telephone Encounter (Signed)
Patient request refill of Celebrex [facility]

## 2017-01-28 ENCOUNTER — Telehealth (INDEPENDENT_AMBULATORY_CARE_PROVIDER_SITE_OTHER): Payer: Self-pay | Admitting: Orthopaedic Surgery

## 2017-01-28 NOTE — Telephone Encounter (Signed)
Patient called advised she is still taking 325mg  aspirin. Patient said she is scheduled for surgery Friday. Patient want to know when should she stop taking the aspirin? Patient said she is also on blood pressure medicine and want to know should she continue taking it as well. The number to contact patient is [phone]

## 2017-01-29 ENCOUNTER — Other Ambulatory Visit (HOSPITAL_COMMUNITY): Payer: Self-pay | Admitting: Emergency Medicine

## 2017-01-29 NOTE — Patient Instructions (Addendum)
Breanna Hernandez  01/29/2017   Your procedure is scheduled on: 02-01-17  Report to [facility] Main  Entrance    Report to admitting at [facility]   Call this number if you have problems the morning of surgery  [phone]   Remember: ONLY 1 PERSON MAY GO WITH YOU TO SHORT STAY TO GET  READY MORNING OF YOUR SURGERY.  Do not eat food or drink liquids :After Midnight.     Take these medicines the morning of surgery with A SIP OF WATER: METOPROLOL, LEVOTHYROXINE(SYNTHROID), tramadol if needed                                You may not have any metal on your body including hair pins and              piercings  Do not wear jewelry, make-up, lotions, powders or perfumes, deodorant             Do not wear nail polish.  Do not shave  48 hours prior to surgery.             Do not bring valuables to the hospital. [place].  Contacts, dentures or bridgework may not be worn into surgery.  Leave suitcase in the car. After surgery it may be brought to your room.               Please read over the following fact sheets you were given: _____________________________________________________________________            [facility] - Preparing for Surgery Before surgery, you can play an important role.  Because skin is not sterile, your skin needs to be as free of germs as possible.  You can reduce the number of germs on your skin by washing with CHG (chlorahexidine gluconate) soap before surgery.  CHG is an antiseptic cleaner which kills germs and bonds with the skin to continue killing germs even after washing. Please DO NOT use if you have an allergy to CHG or antibacterial soaps.  If your skin becomes reddened/irritated stop using the CHG and inform your nurse when you arrive at Short Stay. Do not shave (including legs and underarms) for at least 48 hours prior to the first CHG shower.  You may shave your face/neck. Please  follow these instructions carefully:  1.  Shower with CHG Soap the night before surgery and the  morning of Surgery.  2.  If you choose to wash your hair, wash your hair first as usual with your  normal  shampoo.  3.  After you shampoo, rinse your hair and body thoroughly to remove the  shampoo.                           4.  Use CHG as you would any other liquid soap.  You can apply chg directly  to the skin and wash                       Gently with a scrungie or clean washcloth.  5.  Apply the CHG Soap to your body ONLY FROM THE NECK DOWN.   Do not use on  face/ open                           Wound or open sores. Avoid contact with eyes, ears mouth and genitals (private parts).                       Wash face,  Genitals (private parts) with your normal soap.             6.  Wash thoroughly, paying special attention to the area where your surgery  will be performed.  7.  Thoroughly rinse your body with warm water from the neck down.  8.  DO NOT shower/wash with your normal soap after using and rinsing off  the CHG Soap.                9.  Pat yourself dry with a clean towel.            10.  Wear clean pajamas.            11.  Place clean sheets on your bed the night of your first shower and do not  sleep with pets. Day of Surgery : Do not apply any lotions/deodorants the morning of surgery.  Please wear clean clothes to the hospital/surgery center.  FAILURE TO FOLLOW THESE INSTRUCTIONS MAY RESULT IN THE CANCELLATION OF YOUR SURGERY PATIENT SIGNATURE_________________________________  NURSE SIGNATURE__________________________________  ________________________________________________________________________

## 2017-01-29 NOTE — Telephone Encounter (Signed)
She should continue her blood pressure meds and stop the aspirin after Wed

## 2017-01-29 NOTE — Telephone Encounter (Signed)
Patient aware of the below message from CB 

## 2017-01-29 NOTE — Progress Notes (Signed)
VAS [place] LOWER EXTREMITY 01-25-17 EPIC  LOV CARDIOLOGY DR CROITORU 11-16-15 EPIC  STRESS TEST 08-12-2013 EPIC

## 2017-01-29 NOTE — Telephone Encounter (Signed)
Please advise 

## 2017-01-30 ENCOUNTER — Encounter (HOSPITAL_COMMUNITY)
Admission: RE | Admit: 2017-01-30 | Discharge: 2017-01-30 | Disposition: A | Discharge status: Home / Self Care | Payer: Medicare Other | Source: Ambulatory Visit | Attending: Orthopaedic Surgery | Admitting: Orthopaedic Surgery

## 2017-01-30 ENCOUNTER — Encounter (HOSPITAL_COMMUNITY): Payer: Self-pay

## 2017-01-30 DIAGNOSIS — R001 Bradycardia, unspecified: Secondary | ICD-10-CM | POA: Insufficient documentation

## 2017-01-30 DIAGNOSIS — Z01818 Encounter for other preprocedural examination: Secondary | ICD-10-CM | POA: Insufficient documentation

## 2017-01-30 DIAGNOSIS — I519 Heart disease, unspecified: Secondary | ICD-10-CM | POA: Insufficient documentation

## 2017-01-30 DIAGNOSIS — M1611 Unilateral primary osteoarthritis, right hip: Secondary | ICD-10-CM | POA: Diagnosis not present

## 2017-01-30 DIAGNOSIS — Z0183 Encounter for blood typing: Secondary | ICD-10-CM | POA: Insufficient documentation

## 2017-01-30 DIAGNOSIS — R9431 Abnormal electrocardiogram [ECG] [EKG]: Secondary | ICD-10-CM | POA: Insufficient documentation

## 2017-01-30 DIAGNOSIS — Z01812 Encounter for preprocedural laboratory examination: Secondary | ICD-10-CM | POA: Diagnosis not present

## 2017-01-30 LAB — COMPREHENSIVE METABOLIC PANEL
ALBUMIN: 3.8 g/dL (ref 3.5–5.0)
ALT: 81 U/L — ABNORMAL HIGH (ref 14–54)
ANION GAP: 10 (ref 5–15)
AST: 107 U/L — ABNORMAL HIGH (ref 15–41)
Alkaline Phosphatase: 93 U/L (ref 38–126)
BUN: 18 mg/dL (ref 6–20)
CHLORIDE: 100 mmol/L — AB (ref 101–111)
CO2: 28 mmol/L (ref 22–32)
Calcium: 9.5 mg/dL (ref 8.9–10.3)
Creatinine, Ser: 0.86 mg/dL (ref 0.44–1.00)
GFR calc Af Amer: >60 mL/min (ref >60)
Glucose, Bld: 131 mg/dL — ABNORMAL HIGH (ref 65–99)
POTASSIUM: 3.5 mmol/L (ref 3.5–5.1)
Sodium: 138 mmol/L (ref 135–145)
Total Bilirubin: 1.8 mg/dL — ABNORMAL HIGH (ref 0.3–1.2)
Total Protein: 7.3 g/dL (ref 6.5–8.1)

## 2017-01-30 LAB — CBC
HEMATOCRIT: 38.5 % (ref 36.0–46.0)
HEMOGLOBIN: 13.5 g/dL (ref 12.0–15.0)
MCH: 35.1 pg — ABNORMAL HIGH (ref 26.0–34.0)
MCHC: 35.1 g/dL (ref 30.0–36.0)
MCV: 100 fL (ref 78.0–100.0)
PLATELETS: 179 10*3/uL (ref 150–400)
RBC: 3.85 MIL/uL — ABNORMAL LOW (ref 3.87–5.11)
RDW: 12.6 % (ref 11.5–15.5)
WBC: 7.2 10*3/uL (ref 4.0–10.5)

## 2017-01-30 LAB — SURGICAL PCR SCREEN
MRSA, PCR: NEGATIVE
Staphylococcus aureus: POSITIVE — AB

## 2017-01-30 NOTE — Progress Notes (Signed)
RN consulted with anesthesia Dr Annye Asa concerning patient cardiac history. Dr Glennon Mac accessed patient chart and after reviewing, is recommending patient obtain cardiac clearance before proceeding with surgery. RN made patient aware ; patient verbalized understanding.  RN called Diplomatic Services operational officer for Dr Kathrynn Speed and LVMM to make office aware that anesthesia is requesting cardiac clearance before proceeding.

## 2017-01-31 ENCOUNTER — Telehealth: Payer: Self-pay | Admitting: Cardiovascular Disease

## 2017-01-31 NOTE — Telephone Encounter (Signed)
New message  02/07/17 2:30 with [facility] Pre-operative Risk Assessment    Request for surgical clearance:  1. What type of surgery is being performed? Hip surgery total   2. When is this surgery scheduled?  Rescheduled til clearance is done  3. Are there any medications that need to be held prior to surgery and how long? Aspirin   4. Practice name and name of physician performing surgery? [facility]   5. What is your office phone and fax number? [place] [facility]  Fax [phone]  6. Anesthesia type (None, local, MAC, general) ? Not decided yet    Howie Ill 01/31/2017, 1:45 PM  _________________________________________________________________   (provider comments below)

## 2017-02-01 ENCOUNTER — Inpatient Hospital Stay (HOSPITAL_COMMUNITY): Admission: RE | Admit: 2017-02-01 | Payer: Medicare Other | Source: Ambulatory Visit | Admitting: Orthopaedic Surgery

## 2017-02-01 ENCOUNTER — Encounter (HOSPITAL_COMMUNITY): Admission: RE | Payer: Self-pay | Source: Ambulatory Visit

## 2017-02-01 LAB — TYPE AND SCREEN
ABO/RH(D): B POS
Antibody Screen: NEGATIVE

## 2017-02-01 SURGERY — ARTHROPLASTY, HIP, TOTAL, ANTERIOR APPROACH
Anesthesia: Choice | Site: Hip | Laterality: Right

## 2017-02-01 NOTE — Telephone Encounter (Signed)
Patient has upcoming appt with Kerin Ransom on 02/07/2017 for clearance.

## 2017-02-04 NOTE — Telephone Encounter (Signed)
Please make sure appointment with Kerin Ransom, PA-C on 02/07/17 is designated surgical clearance appointment. Richardson Dopp, PA-C    02/04/2017 3:17 PM

## 2017-02-04 NOTE — Telephone Encounter (Signed)
Appt is noted in the notes that this is a surgery clearance appt.

## 2017-02-07 ENCOUNTER — Ambulatory Visit (INDEPENDENT_AMBULATORY_CARE_PROVIDER_SITE_OTHER): Payer: Medicare Other | Admitting: Cardiology

## 2017-02-07 ENCOUNTER — Ambulatory Visit: Payer: Medicare Other | Admitting: Cardiology

## 2017-02-07 ENCOUNTER — Encounter: Payer: Self-pay | Admitting: Cardiology

## 2017-02-07 VITALS — BP 124/70 | HR 64 | Ht 63.0 in | Wt 258.0 lb

## 2017-02-07 DIAGNOSIS — Z01810 Encounter for preprocedural cardiovascular examination: Secondary | ICD-10-CM | POA: Insufficient documentation

## 2017-02-07 DIAGNOSIS — R9431 Abnormal electrocardiogram [ECG] [EKG]: Secondary | ICD-10-CM | POA: Diagnosis not present

## 2017-02-07 DIAGNOSIS — Z8679 Personal history of other diseases of the circulatory system: Secondary | ICD-10-CM

## 2017-02-07 DIAGNOSIS — Z85850 Personal history of malignant neoplasm of thyroid: Secondary | ICD-10-CM | POA: Insufficient documentation

## 2017-02-07 DIAGNOSIS — Z853 Personal history of malignant neoplasm of breast: Secondary | ICD-10-CM

## 2017-02-07 DIAGNOSIS — I480 Paroxysmal atrial fibrillation: Secondary | ICD-10-CM

## 2017-02-07 DIAGNOSIS — M1611 Unilateral primary osteoarthritis, right hip: Secondary | ICD-10-CM | POA: Diagnosis not present

## 2017-02-07 DIAGNOSIS — I2510 Atherosclerotic heart disease of native coronary artery without angina pectoris: Secondary | ICD-10-CM

## 2017-02-07 DIAGNOSIS — E785 Hyperlipidemia, unspecified: Secondary | ICD-10-CM

## 2017-02-07 DIAGNOSIS — I10 Essential (primary) hypertension: Secondary | ICD-10-CM | POA: Diagnosis not present

## 2017-02-07 NOTE — Assessment & Plan Note (Signed)
Surgery pending clearance

## 2017-02-07 NOTE — Progress Notes (Signed)
02/07/2017 Breanna Hernandez   03/11/1946  [id number]  Primary Physician Ann Held, DO Primary Cardiologist: Dr Sallyanne Kuster  HPI:  Pleasant 71 y/o female who had a type 1 aortic dissection in 2012 and had urgent surgery. She had post op PAF but this has not recurred and she is not on anticoagulants. At the time of her surgery she was noted to have plaque in her RCA and she received an SVG-RCA. We suspect she may have other CAD (she never had a Lt heart cath) as well but she has not had chest pain. A Myoview in 2015 was low risk.  She is in the office today for pre op clearance prior to hip surgery.  Since we saw her last in Nov 2017 she has been diagnosed with endometrial cancer and has had a total hysterectomy. She also had thyroid cancer in Aug 2017 and surgery for that in Oct 2017. She denies any chest pain or unusual exertional symptoms but she admits she is limited secondary to her hip DJD (using a walker today in the office).    Current Outpatient Prescriptions  Medication Sig Dispense Refill  . aspirin EC 81 MG tablet Take 81 mg by mouth daily.    . benazepril (LOTENSIN) 40 MG tablet Take 1 tablet (40 mg total) by mouth daily. NEED OV. 30 tablet 0  . CALCIUM-MAGNESIUM-VITAMIN D PO Take 1 tablet by mouth 2 (two) times daily.     . hydrochlorothiazide (MICROZIDE) 12.5 MG capsule Take 2 capsules (25 mg total) by mouth daily. 180 capsule 3  . ibuprofen (ADVIL,MOTRIN) 200 MG tablet Take 400 mg by mouth daily as needed for headache or moderate pain.     Marland Kitchen levothyroxine (SYNTHROID, LEVOTHROID) 175 MCG tablet TAKE 1 TABLET (175 MCG TOTAL) BY MOUTH DAILY BEFORE BREAKFAST. 30 tablet 6  . LORazepam (ATIVAN) 1 MG tablet Take 1 tablet (1 mg total) by mouth as needed for anxiety. TAKE 1 TABLET BY MOUTH AS NEEDED( NOT ON A REGULAR BASIS) 30 tablet 0  . metoprolol tartrate (LOPRESSOR) 50 MG tablet Take 1 tablet (50 mg total) by mouth 2 (two) times daily. NEEDS APPOINTMENT FOR FUTURE REFILLS 60  tablet 0  . Omega-3 Fatty Acids (FISH OIL PO) Take 1 capsule by mouth 2 (two) times daily.    Marland Kitchen OVER THE COUNTER MEDICATION Take 1 tablet by mouth every morning. Product Name: EHT Mind enhancement formula Vitamin d 200 units Vitamin b 1.6 mg Vitamin b-12 10 mcg Selinum 70 mcg Coffee Extract 35 mg Alpha leporic acid 50 mg huperzine-a 50 mcg    . Probiotic Product (PROBIOTIC PO) Take 1 capsule by mouth 2 (two) times daily.     No current facility-administered medications for this visit.     No Known Allergies  Past Medical History:  Diagnosis Date  . Arthritis   . Atrial fibrillation ([place])    h/o post-op 04/2010  . Breast cancer ([place])    x 3 cancer - radiation right x2 ''00,'05, left '07 mammosite radiation   . Diverticulosis   . 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  . Fatty liver 01/10/07  . GERD (gastroesophageal reflux disease)   . Headache    migraines   . 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 HTN (hypertension)   . Hyperlipidemia   . Hyperplastic colon polyp   . Hypothyroidism   . IBS (irritable bowel syndrome)   .  Lower extremity neuropathy    occ. affects walking and balance"foot to knees"  . Obesity   . Renal cyst, left   . Thyroid cancer [facility]) 11/2015   surgery Oct 2017    Social History   Social History  . Marital status: Married    Spouse name: N/A  . Number of children: N/A  . Years of education: N/A   Occupational History  . Not on file.   Social History Main Topics  . Smoking status: Never Smoker  . Smokeless tobacco: Never Used  . Alcohol use No  . Drug use: No  . Sexual activity: Yes   Other Topics Concern  . Not on file   Social History Narrative  . No narrative on file     Family History  Problem Relation Age of Onset  . Aneurysm Father        CNS  . Heart attack Father        x2  . Alzheimer's disease Mother   . Hypertension Mother   . Goiter Mother   . Hypertension Brother     . Kidney disease Brother   . Colon cancer Neg Hx      Review of Systems: General: negative for chills, fever, night sweats or weight changes.  Cardiovascular: negative for chest pain, dyspnea on exertion, edema, orthopnea, palpitations, paroxysmal nocturnal dyspnea or shortness of breath Dermatological: negative for rash Respiratory: negative for cough or wheezing Urologic: negative for hematuria Abdominal: negative for nausea, vomiting, diarrhea, bright red blood per rectum, melena, or hematemesis Neurologic: negative for visual changes, syncope, or dizziness All other systems reviewed and are otherwise negative except as noted above.    Blood pressure 124/70, pulse 64, height 5\' 3"  (1.6 m), weight 258 lb (117 kg).  General appearance: alert, cooperative, no distress and moderately obese Neck: no carotid bruit and no JVD Lungs: clear to auscultation bilaterally Heart: regular rate and rhythm and 1/6 systolic murmur AOV Abdomen: obese, non tender Extremities: trace edema Pulses: 2+ and symmetric Skin: Skin color, texture, turgor normal. No rashes or lesions Neurologic: Grossly normal  EKG 01/30/17 EKG shows TWI 1 and AVL, flat TW in V5-V6. This is new from her last EKG in 2017.  ASSESSMENT AND PLAN:   Pre-operative cardiovascular examination Pt seen today for pre op clearance prior to hip surgery  Abnormal EKG New lateral TWI on EKG  CAD (coronary artery disease) Pt had visible plaque in RCA at the time of her aortic dissection surgery in 2012 and received an SVG-RCA graft. She never had a Lt heart cath to look at her coronaries. Myoview was low risk in 2015  History of aortic dissection S/p repair, type 1 aortic dissection and CABG- jan 2012- Dr. Roxan Hockey  Essential hypertension Controlled  Unilateral primary osteoarthritis, right hip Surgery pending clearance  Dyslipidemia LDL 159  In 2016. Pt declines statin Rx   PLAN  Though the pt is technically a  class 1 risk for an adverse cardiac event we cannot adequately evaluate her functional capacity secondary to her DJD. Her EKG is also different than her previous EKG with new lateral TWI. I have ordered a Lexiscan Myoview to be done before clearance.   Kerin Ransom PA-C 02/07/2017 3:21 PM

## 2017-02-07 NOTE — Assessment & Plan Note (Signed)
LDL 159  In 2016. Pt declines statin Rx

## 2017-02-07 NOTE — Assessment & Plan Note (Signed)
New lateral TWI on EKG

## 2017-02-07 NOTE — Assessment & Plan Note (Signed)
Controlled.  

## 2017-02-07 NOTE — Assessment & Plan Note (Signed)
S/p repair, type 1 aortic dissection and CABG- jan 2012- Dr. Roxan Hockey

## 2017-02-07 NOTE — Assessment & Plan Note (Signed)
Pt had visible plaque in RCA at the time of her aortic dissection surgery in 2012 and received an SVG-RCA graft. She never had a Lt heart cath to look at her coronaries. Myoview was low risk in 2015

## 2017-02-07 NOTE — Assessment & Plan Note (Signed)
Pt seen today for pre op clearance prior to hip surgery 

## 2017-02-07 NOTE — Patient Instructions (Signed)
Medication Instructions:  Continue current medications  If you need a refill on your cardiac medications before your next appointment, please call your pharmacy.  Labwork: None Ordered   Testing/Procedures: Your physician has requested that you have a lexiscan myoview. For further information please visit [email or web address]. Please follow instruction sheet, as given.   Follow-Up: Your physician wants you to follow-up in: 6 Months with Dr Sallyanne Kuster. You should receive a reminder letter in the mail two months in advance. If you do not receive a letter, please call our office [phone].    Thank you for choosing [facility] at [facility]!!

## 2017-02-08 ENCOUNTER — Other Ambulatory Visit: Payer: Self-pay | Admitting: Family Medicine

## 2017-02-08 NOTE — Telephone Encounter (Signed)
I saw the patient 02/07/17. She needs a Myoview before pre op clearance can be given, this is scheduled.   Kerin Ransom PA-C 02/08/2017 5:15 PM

## 2017-02-12 ENCOUNTER — Telehealth (HOSPITAL_COMMUNITY): Payer: Self-pay | Admitting: *Deleted

## 2017-02-12 NOTE — Telephone Encounter (Signed)
Close encounter 

## 2017-02-14 ENCOUNTER — Inpatient Hospital Stay (INDEPENDENT_AMBULATORY_CARE_PROVIDER_SITE_OTHER): Payer: Medicare Other | Admitting: Orthopaedic Surgery

## 2017-02-14 ENCOUNTER — Ambulatory Visit (HOSPITAL_COMMUNITY)
Admission: RE | Admit: 2017-02-14 | Discharge: 2017-02-14 | Disposition: A | Discharge status: Home / Self Care | Payer: Medicare Other | Source: Ambulatory Visit | Attending: Cardiology | Admitting: Cardiology

## 2017-02-14 DIAGNOSIS — I2510 Atherosclerotic heart disease of native coronary artery without angina pectoris: Secondary | ICD-10-CM | POA: Insufficient documentation

## 2017-02-14 DIAGNOSIS — R9431 Abnormal electrocardiogram [ECG] [EKG]: Secondary | ICD-10-CM | POA: Diagnosis present

## 2017-02-14 MED ORDER — REGADENOSON 0.4 MG/5ML IV SOLN
0.4000 mg | Freq: Once | INTRAVENOUS | Status: AC
  Administered 2017-02-14: 0.4 mg via INTRAVENOUS

## 2017-02-14 MED ORDER — TECHNETIUM TC 99M TETROFOSMIN IV KIT
25.4000 | PACK | Freq: Once | INTRAVENOUS | Status: AC | PRN
  Administered 2017-02-14: 25.4 via INTRAVENOUS
  Filled 2017-02-14: qty 26

## 2017-02-15 ENCOUNTER — Ambulatory Visit (HOSPITAL_COMMUNITY)
Admission: RE | Admit: 2017-02-15 | Discharge: 2017-02-15 | Disposition: A | Discharge status: Home / Self Care | Payer: Medicare Other | Source: Ambulatory Visit | Attending: Cardiovascular Disease | Admitting: Cardiovascular Disease

## 2017-02-15 LAB — MYOCARDIAL PERFUSION IMAGING
LV dias vol: 56 mL (ref 46–106)
LV sys vol: 10 mL
Peak HR: 77 {beats}/min
Rest HR: 58 {beats}/min
SDS: 3
SRS: 2
SSS: 5
TID: 0.57

## 2017-02-15 MED ORDER — TECHNETIUM TC 99M TETROFOSMIN IV KIT
24.4000 | PACK | Freq: Once | INTRAVENOUS | Status: AC | PRN
  Administered 2017-02-15: 24.4 via INTRAVENOUS

## 2017-02-18 NOTE — Telephone Encounter (Signed)
Myoview was normal- OK for surgery from cardiology standpoint.  Kerin Ransom PA-C 02/18/2017 7:54 AM

## 2017-02-19 ENCOUNTER — Other Ambulatory Visit: Payer: Self-pay | Admitting: Cardiovascular Disease

## 2017-02-19 NOTE — Telephone Encounter (Signed)
Rx(s) sent to pharmacy electronically.  

## 2017-02-21 ENCOUNTER — Inpatient Hospital Stay (INDEPENDENT_AMBULATORY_CARE_PROVIDER_SITE_OTHER): Payer: Medicare Other | Admitting: Orthopaedic Surgery

## 2017-02-22 ENCOUNTER — Telehealth: Payer: Self-pay | Admitting: Cardiovascular Disease

## 2017-02-22 NOTE — Telephone Encounter (Signed)
Pt would like to know if her Stress test results are back from 02-15-15 please.

## 2017-02-22 NOTE — Telephone Encounter (Signed)
Faxed to Dr Whitney Muse office via [place].

## 2017-02-25 NOTE — Telephone Encounter (Signed)
pt aware of results  

## 2017-03-08 ENCOUNTER — Other Ambulatory Visit: Payer: Self-pay | Admitting: Cardiovascular Disease

## 2017-03-11 NOTE — Progress Notes (Signed)
Preop on 12/6.  Needs orders in epic

## 2017-03-12 ENCOUNTER — Other Ambulatory Visit (INDEPENDENT_AMBULATORY_CARE_PROVIDER_SITE_OTHER): Payer: Self-pay | Admitting: Orthopaedic Surgery

## 2017-03-12 ENCOUNTER — Encounter (HOSPITAL_COMMUNITY): Payer: Self-pay

## 2017-03-12 DIAGNOSIS — M1611 Unilateral primary osteoarthritis, right hip: Secondary | ICD-10-CM

## 2017-03-12 NOTE — Progress Notes (Signed)
EKG 01-30-17 epic  stress 02/15/17 epic has clearance note from cards.  LOV Cards 02/07/17 epic

## 2017-03-12 NOTE — Patient Instructions (Addendum)
Breanna Hernandez  03/12/2017   Your procedure is scheduled on: 03-15-17  Report to [facility]  Entrance   Report to admitting at    [place] AM   Call this number if you have problems the morning of surgery   [phone]   Remember: ONLY 1 PERSON MAY GO WITH YOU TO SHORT STAY TO GET  READY MORNING OF YOUR SURGERY.  Do not eat food or drink liquids :After Midnight.     Take these medicines the morning of surgery with A SIP OF WATER: metoprolol, ativan if needed, levothyroxine                                You may not have any metal on your body including hair pins and              piercings  Do not wear jewelry, make-up, lotions, powders or perfumes, deodorant             Do not wear nail polish.  Do not shave  48 hours prior to surgery.              Do not bring valuables to the hospital. [place].  Contacts, dentures or bridgework may not be worn into surgery.  Leave suitcase in the car. After surgery it may be brought to your room.                Please read over the following fact sheets you were given: _____________________________________________________________________          [facility] - Preparing for Surgery Before surgery, you can play an important role.  Because skin is not sterile, your skin needs to be as free of germs as possible.  You can reduce the number of germs on your skin by washing with CHG (chlorahexidine gluconate) soap before surgery.  CHG is an antiseptic cleaner which kills germs and bonds with the skin to continue killing germs even after washing. Please DO NOT use if you have an allergy to CHG or antibacterial soaps.  If your skin becomes reddened/irritated stop using the CHG and inform your nurse when you arrive at Short Stay. Do not shave (including legs and underarms) for at least 48 hours prior to the first CHG shower.  You may shave your face/neck. Please follow these  instructions carefully:  1.  Shower with CHG Soap the night before surgery and the  morning of Surgery.  2.  If you choose to wash your hair, wash your hair first as usual with your  normal  shampoo.  3.  After you shampoo, rinse your hair and body thoroughly to remove the  shampoo.                           4.  Use CHG as you would any other liquid soap.  You can apply chg directly  to the skin and wash                       Gently with a scrungie or clean washcloth.  5.  Apply the CHG Soap to your body ONLY FROM THE NECK DOWN.  Do not use on face/ open                           Wound or open sores. Avoid contact with eyes, ears mouth and genitals (private parts).                       Wash face,  Genitals (private parts) with your normal soap.             6.  Wash thoroughly, paying special attention to the area where your surgery  will be performed.  7.  Thoroughly rinse your body with warm water from the neck down.  8.  DO NOT shower/wash with your normal soap after using and rinsing off  the CHG Soap.                9.  Pat yourself dry with a clean towel.            10.  Wear clean pajamas.            11.  Place clean sheets on your bed the night of your first shower and do not  sleep with pets. Day of Surgery : Do not apply any lotions/deodorants the morning of surgery.  Please wear clean clothes to the hospital/surgery center.  FAILURE TO FOLLOW THESE INSTRUCTIONS MAY RESULT IN THE CANCELLATION OF YOUR SURGERY PATIENT SIGNATURE_________________________________  NURSE SIGNATURE__________________________________  ________________________________________________________________________  WHAT IS A BLOOD TRANSFUSION? Blood Transfusion Information  A transfusion is the replacement of blood or some of its parts. Blood is made up of multiple cells which provide different functions.  Red blood cells carry oxygen and are used for blood loss replacement.  White blood cells fight against  infection.  Platelets control bleeding.  Plasma helps clot blood.  Other blood products are available for specialized needs, such as hemophilia or other clotting disorders. BEFORE THE TRANSFUSION  Who gives blood for transfusions?   Healthy volunteers who are fully evaluated to make sure their blood is safe. This is blood bank blood. Transfusion therapy is the safest it has ever been in the practice of medicine. Before blood is taken from a donor, a complete history is taken to make sure that person has no history of diseases nor engages in risky social behavior (examples are intravenous drug use or sexual activity with multiple partners). The donor's travel history is screened to minimize risk of transmitting infections, such as malaria. The donated blood is tested for signs of infectious diseases, such as HIV and hepatitis. The blood is then tested to be sure it is compatible with you in order to minimize the chance of a transfusion reaction. If you or a relative donates blood, this is often done in anticipation of surgery and is not appropriate for emergency situations. It takes many days to process the donated blood. RISKS AND COMPLICATIONS Although transfusion therapy is very safe and saves many lives, the main dangers of transfusion include:   Getting an infectious disease.  Developing a transfusion reaction. This is an allergic reaction to something in the blood you were given. Every precaution is taken to prevent this. The decision to have a blood transfusion has been considered carefully by your caregiver before blood is given. Blood is not given unless the benefits outweigh the risks. AFTER THE TRANSFUSION  Right after receiving a blood transfusion, you will usually feel much better and more energetic. This is especially  true if your red blood cells have gotten low (anemic). The transfusion raises the level of the red blood cells which carry oxygen, and this usually causes an energy  increase.  The nurse administering the transfusion will monitor you carefully for complications. HOME CARE INSTRUCTIONS  No special instructions are needed after a transfusion. You may find your energy is better. Speak with your caregiver about any limitations on activity for underlying diseases you may have. SEEK MEDICAL CARE IF:   Your condition is not improving after your transfusion.  You develop redness or irritation at the intravenous (IV) site. SEEK IMMEDIATE MEDICAL CARE IF:  Any of the following symptoms occur over the next 12 hours:  Shaking chills.  You have a temperature by mouth above 102 F (38.9 C), not controlled by medicine.  Chest, back, or muscle pain.  People around you feel you are not acting correctly or are confused.  Shortness of breath or difficulty breathing.  Dizziness and fainting.  You get a rash or develop hives.  You have a decrease in urine output.  Your urine turns a dark color or changes to pink, red, or brown. Any of the following symptoms occur over the next 10 days:  You have a temperature by mouth above 102 F (38.9 C), not controlled by medicine.  Shortness of breath.  Weakness after normal activity.  The white part of the eye turns yellow (jaundice).  You have a decrease in the amount of urine or are urinating less often.  Your urine turns a dark color or changes to pink, red, or brown. Document Released: 03/23/2000 Document Revised: 06/18/2011 Document Reviewed: 11/10/2007 ExitCare Patient Information 2014 [place].  _______________________________________________________________________  Incentive Spirometer  An incentive spirometer is a tool that can help keep your lungs clear and active. This tool measures how well you are filling your lungs with each breath. Taking long deep breaths may help reverse or decrease the chance of developing breathing (pulmonary) problems (especially infection) following:  A long  period of time when you are unable to move or be active. BEFORE THE PROCEDURE   If the spirometer includes an indicator to show your best effort, your nurse or respiratory therapist will set it to a desired goal.  If possible, sit up straight or lean slightly forward. Try not to slouch.  Hold the incentive spirometer in an upright position. INSTRUCTIONS FOR USE  1. Sit on the edge of your bed if possible, or sit up as far as you can in bed or on a chair. 2. Hold the incentive spirometer in an upright position. 3. Breathe out normally. 4. Place the mouthpiece in your mouth and seal your lips tightly around it. 5. Breathe in slowly and as deeply as possible, raising the piston or the ball toward the top of the column. 6. Hold your breath for 3-5 seconds or for as long as possible. Allow the piston or ball to fall to the bottom of the column. 7. Remove the mouthpiece from your mouth and breathe out normally. 8. Rest for a few seconds and repeat Steps 1 through 7 at least 10 times every 1-2 hours when you are awake. Take your time and take a few normal breaths between deep breaths. 9. The spirometer may include an indicator to show your best effort. Use the indicator as a goal to work toward during each repetition. 10. After each set of 10 deep breaths, practice coughing to be sure your lungs are clear. If you have  an incision (the cut made at the time of surgery), support your incision when coughing by placing a pillow or rolled up towels firmly against it. Once you are able to get out of bed, walk around indoors and cough well. You may stop using the incentive spirometer when instructed by your caregiver.  RISKS AND COMPLICATIONS  Take your time so you do not get dizzy or light-headed.  If you are in pain, you may need to take or ask for pain medication before doing incentive spirometry. It is harder to take a deep breath if you are having pain. AFTER USE  Rest and breathe slowly and  easily.  It can be helpful to keep track of a log of your progress. Your caregiver can provide you with a simple table to help with this. If you are using the spirometer at home, follow these instructions: [place] IF:   You are having difficultly using the spirometer.  You have trouble using the spirometer as often as instructed.  Your pain medication is not giving enough relief while using the spirometer.  You develop fever of 100.5 F (38.1 C) or higher. SEEK IMMEDIATE MEDICAL CARE IF:   You cough up bloody sputum that had not been present before.  You develop fever of 102 F (38.9 C) or greater.  You develop worsening pain at or near the incision site. MAKE SURE YOU:   Understand these instructions.  Will watch your condition.  Will get help right away if you are not doing well or get worse. Document Released: 08/06/2006 Document Revised: 06/18/2011 Document Reviewed: 10/07/2006 [facility] Patient Information 2014 [place], [place].   ________________________________________________________________________

## 2017-03-14 ENCOUNTER — Other Ambulatory Visit: Payer: Self-pay

## 2017-03-14 ENCOUNTER — Encounter (HOSPITAL_COMMUNITY): Payer: Self-pay

## 2017-03-14 ENCOUNTER — Encounter (HOSPITAL_COMMUNITY)
Admission: RE | Admit: 2017-03-14 | Discharge: 2017-03-14 | Disposition: A | Discharge status: Home / Self Care | Payer: Medicare Other | Source: Ambulatory Visit | Attending: Orthopaedic Surgery | Admitting: Orthopaedic Surgery

## 2017-03-14 LAB — CBC
HEMATOCRIT: 37.8 % (ref 36.0–46.0)
HEMOGLOBIN: 13.2 g/dL (ref 12.0–15.0)
MCH: 35.2 pg — ABNORMAL HIGH (ref 26.0–34.0)
MCHC: 34.9 g/dL (ref 30.0–36.0)
MCV: 100.8 fL — AB (ref 78.0–100.0)
Platelets: 167 10*3/uL (ref 150–400)
RBC: 3.75 MIL/uL — AB (ref 3.87–5.11)
RDW: 12.4 % (ref 11.5–15.5)
WBC: 7.7 10*3/uL (ref 4.0–10.5)

## 2017-03-14 LAB — BASIC METABOLIC PANEL
Anion gap: 7 (ref 5–15)
BUN: 17 mg/dL (ref 6–20)
CHLORIDE: 102 mmol/L (ref 101–111)
CO2: 28 mmol/L (ref 22–32)
Calcium: 9.4 mg/dL (ref 8.9–10.3)
Creatinine, Ser: 0.8 mg/dL (ref 0.44–1.00)
Glucose, Bld: 105 mg/dL — ABNORMAL HIGH (ref 65–99)
POTASSIUM: 3.8 mmol/L (ref 3.5–5.1)
SODIUM: 137 mmol/L (ref 135–145)

## 2017-03-14 LAB — SURGICAL PCR SCREEN
MRSA, PCR: NEGATIVE
STAPHYLOCOCCUS AUREUS: POSITIVE — AB

## 2017-03-15 ENCOUNTER — Inpatient Hospital Stay (HOSPITAL_COMMUNITY): Payer: Medicare Other

## 2017-03-15 ENCOUNTER — Encounter (HOSPITAL_COMMUNITY)
Admission: RE | Disposition: A | Discharge status: Home Health | Payer: Self-pay | Source: Ambulatory Visit | Attending: Orthopaedic Surgery

## 2017-03-15 ENCOUNTER — Encounter (HOSPITAL_COMMUNITY): Payer: Self-pay | Admitting: Certified Registered Nurse Anesthetist

## 2017-03-15 ENCOUNTER — Inpatient Hospital Stay (HOSPITAL_COMMUNITY): Payer: Medicare Other | Admitting: Certified Registered Nurse Anesthetist

## 2017-03-15 ENCOUNTER — Other Ambulatory Visit: Payer: Self-pay

## 2017-03-15 ENCOUNTER — Inpatient Hospital Stay (HOSPITAL_COMMUNITY)
Admission: RE | Admit: 2017-03-15 | Discharge: 2017-03-19 | DRG: 470 | Disposition: A | Discharge status: Home Health | Payer: Medicare Other | Source: Ambulatory Visit | Attending: Orthopaedic Surgery | Admitting: Orthopaedic Surgery

## 2017-03-15 DIAGNOSIS — Z951 Presence of aortocoronary bypass graft: Secondary | ICD-10-CM | POA: Diagnosis not present

## 2017-03-15 DIAGNOSIS — M1611 Unilateral primary osteoarthritis, right hip: Secondary | ICD-10-CM | POA: Diagnosis present

## 2017-03-15 DIAGNOSIS — E785 Hyperlipidemia, unspecified: Secondary | ICD-10-CM | POA: Diagnosis present

## 2017-03-15 DIAGNOSIS — K219 Gastro-esophageal reflux disease without esophagitis: Secondary | ICD-10-CM | POA: Diagnosis present

## 2017-03-15 DIAGNOSIS — Z96641 Presence of right artificial hip joint: Secondary | ICD-10-CM

## 2017-03-15 DIAGNOSIS — I2510 Atherosclerotic heart disease of native coronary artery without angina pectoris: Secondary | ICD-10-CM | POA: Diagnosis present

## 2017-03-15 DIAGNOSIS — E039 Hypothyroidism, unspecified: Secondary | ICD-10-CM | POA: Diagnosis present

## 2017-03-15 DIAGNOSIS — I10 Essential (primary) hypertension: Secondary | ICD-10-CM | POA: Diagnosis present

## 2017-03-15 DIAGNOSIS — Z6841 Body Mass Index (BMI) 40.0 and over, adult: Secondary | ICD-10-CM

## 2017-03-15 DIAGNOSIS — Z85850 Personal history of malignant neoplasm of thyroid: Secondary | ICD-10-CM | POA: Diagnosis not present

## 2017-03-15 DIAGNOSIS — M25551 Pain in right hip: Secondary | ICD-10-CM | POA: Diagnosis present

## 2017-03-15 DIAGNOSIS — Z853 Personal history of malignant neoplasm of breast: Secondary | ICD-10-CM | POA: Diagnosis not present

## 2017-03-15 DIAGNOSIS — Z8544 Personal history of malignant neoplasm of other female genital organs: Secondary | ICD-10-CM | POA: Diagnosis not present

## 2017-03-15 HISTORY — PX: TOTAL HIP ARTHROPLASTY: SHX124

## 2017-03-15 LAB — TYPE AND SCREEN
ABO/RH(D): B POS
Antibody Screen: NEGATIVE

## 2017-03-15 SURGERY — ARTHROPLASTY, HIP, TOTAL, ANTERIOR APPROACH
Anesthesia: Spinal | Site: Hip | Laterality: Right

## 2017-03-15 MED ORDER — DEXAMETHASONE SODIUM PHOSPHATE 10 MG/ML IJ SOLN
INTRAMUSCULAR | Status: DC | PRN
  Administered 2017-03-15: 10 mg via INTRAVENOUS

## 2017-03-15 MED ORDER — LEVOTHYROXINE SODIUM 50 MCG PO TABS
175.0000 ug | ORAL_TABLET | Freq: Every day | ORAL | Status: DC
  Administered 2017-03-16 – 2017-03-19 (×4): 175 ug via ORAL
  Filled 2017-03-15 (×4): qty 1

## 2017-03-15 MED ORDER — FENTANYL CITRATE (PF) 100 MCG/2ML IJ SOLN
INTRAMUSCULAR | Status: AC
  Filled 2017-03-15: qty 2

## 2017-03-15 MED ORDER — DEXAMETHASONE SODIUM PHOSPHATE 10 MG/ML IJ SOLN
INTRAMUSCULAR | Status: AC
  Filled 2017-03-15: qty 1

## 2017-03-15 MED ORDER — PROPOFOL 10 MG/ML IV BOLUS
INTRAVENOUS | Status: AC
  Filled 2017-03-15: qty 20

## 2017-03-15 MED ORDER — TRANEXAMIC ACID 1000 MG/10ML IV SOLN: 1000.0000 mg | INTRAVENOUS | Status: DC

## 2017-03-15 MED ORDER — HYDROCHLOROTHIAZIDE 12.5 MG PO CAPS
25.0000 mg | ORAL_CAPSULE | Freq: Every day | ORAL | Status: DC
  Administered 2017-03-16 – 2017-03-19 (×3): 25 mg via ORAL
  Filled 2017-03-15 (×3): qty 2

## 2017-03-15 MED ORDER — ACETAMINOPHEN 325 MG PO TABS
650.0000 mg | ORAL_TABLET | ORAL | Status: DC | PRN
  Administered 2017-03-17: 650 mg via ORAL
  Filled 2017-03-15: qty 2

## 2017-03-15 MED ORDER — CEFAZOLIN SODIUM-DEXTROSE 1-4 GM/50ML-% IV SOLN
1.0000 g | Freq: Four times a day (QID) | INTRAVENOUS | Status: AC
  Administered 2017-03-15 (×2): 1 g via INTRAVENOUS
  Filled 2017-03-15 (×3): qty 50

## 2017-03-15 MED ORDER — PROPOFOL 500 MG/50ML IV EMUL
INTRAVENOUS | Status: DC | PRN
  Administered 2017-03-15: 75 ug/kg/min via INTRAVENOUS

## 2017-03-15 MED ORDER — DEXTROSE 5 % IV SOLN
3.0000 g | INTRAVENOUS | Status: DC
  Filled 2017-03-15: qty 3000

## 2017-03-15 MED ORDER — METHOCARBAMOL 1000 MG/10ML IJ SOLN
500.0000 mg | Freq: Four times a day (QID) | INTRAVENOUS | Status: DC | PRN
  Administered 2017-03-15: 500 mg via INTRAVENOUS
  Filled 2017-03-15: qty 550

## 2017-03-15 MED ORDER — MIDAZOLAM HCL 5 MG/5ML IJ SOLN
INTRAMUSCULAR | Status: DC | PRN
  Administered 2017-03-15: 2 mg via INTRAVENOUS

## 2017-03-15 MED ORDER — HYDROMORPHONE HCL 1 MG/ML IJ SOLN
0.5000 mg | INTRAMUSCULAR | Status: DC | PRN
Start: 2017-03-15 — End: 2017-03-19
  Administered 2017-03-15 – 2017-03-19 (×4): 0.5 mg via INTRAVENOUS
  Filled 2017-03-15 (×3): qty 0.5
  Filled 2017-03-15: qty 1

## 2017-03-15 MED ORDER — ACETAMINOPHEN 650 MG RE SUPP: 650.0000 mg | RECTAL | Status: DC | PRN

## 2017-03-15 MED ORDER — CHLORHEXIDINE GLUCONATE 4 % EX LIQD: 60.0000 mL | Freq: Once | CUTANEOUS | Status: DC

## 2017-03-15 MED ORDER — POLYETHYLENE GLYCOL 3350 17 G PO PACK
17.0000 g | PACK | Freq: Every day | ORAL | Status: DC | PRN
  Administered 2017-03-18: 17 g via ORAL
  Filled 2017-03-15: qty 1

## 2017-03-15 MED ORDER — BUPIVACAINE HCL (PF) 0.75 % IJ SOLN
INTRAMUSCULAR | Status: DC | PRN
  Administered 2017-03-15: 1.6 mL via INTRATHECAL

## 2017-03-15 MED ORDER — TRANEXAMIC ACID 1000 MG/10ML IV SOLN
1000.0000 mg | INTRAVENOUS | Status: AC
  Administered 2017-03-15: 1000 mg via INTRAVENOUS
  Filled 2017-03-15: qty 1100

## 2017-03-15 MED ORDER — DIPHENHYDRAMINE HCL 12.5 MG/5ML PO ELIX: 12.5000 mg | ORAL_SOLUTION | ORAL | Status: DC | PRN

## 2017-03-15 MED ORDER — ALUM & MAG HYDROXIDE-SIMETH 200-200-20 MG/5ML PO SUSP: 30.0000 mL | ORAL | Status: DC | PRN

## 2017-03-15 MED ORDER — MIDAZOLAM HCL 2 MG/2ML IJ SOLN
INTRAMUSCULAR | Status: AC
  Filled 2017-03-15: qty 2

## 2017-03-15 MED ORDER — LACTATED RINGERS IV SOLN
INTRAVENOUS | Status: DC
  Administered 2017-03-15 (×3): via INTRAVENOUS

## 2017-03-15 MED ORDER — ASPIRIN EC 325 MG PO TBEC
325.0000 mg | DELAYED_RELEASE_TABLET | Freq: Every day | ORAL | Status: DC
  Administered 2017-03-16 – 2017-03-19 (×4): 325 mg via ORAL
  Filled 2017-03-15 (×4): qty 1

## 2017-03-15 MED ORDER — SODIUM CHLORIDE 0.9 % IR SOLN
Status: DC | PRN
Start: 2017-03-15 — End: 2017-03-15
  Administered 2017-03-15: 1000 mL

## 2017-03-15 MED ORDER — METOPROLOL TARTRATE 50 MG PO TABS
50.0000 mg | ORAL_TABLET | Freq: Two times a day (BID) | ORAL | Status: DC
  Administered 2017-03-15 – 2017-03-19 (×8): 50 mg via ORAL
  Filled 2017-03-15 (×8): qty 1

## 2017-03-15 MED ORDER — BENAZEPRIL HCL 10 MG PO TABS
40.0000 mg | ORAL_TABLET | Freq: Every day | ORAL | Status: DC
  Administered 2017-03-18 – 2017-03-19 (×2): 40 mg via ORAL
  Filled 2017-03-15 (×2): qty 4

## 2017-03-15 MED ORDER — STERILE WATER FOR IRRIGATION IR SOLN
Status: DC | PRN
  Administered 2017-03-15: 2000 mL

## 2017-03-15 MED ORDER — ONDANSETRON HCL 4 MG/2ML IJ SOLN
INTRAMUSCULAR | Status: AC
  Filled 2017-03-15: qty 2

## 2017-03-15 MED ORDER — ONDANSETRON HCL 4 MG/2ML IJ SOLN: 4.0000 mg | Freq: Four times a day (QID) | INTRAMUSCULAR | Status: DC | PRN

## 2017-03-15 MED ORDER — ONDANSETRON HCL 4 MG/2ML IJ SOLN
INTRAMUSCULAR | Status: DC | PRN
  Administered 2017-03-15: 4 mg via INTRAVENOUS

## 2017-03-15 MED ORDER — PHENOL 1.4 % MT LIQD: 1.0000 | OROMUCOSAL | Status: DC | PRN

## 2017-03-15 MED ORDER — CEFAZOLIN SODIUM-DEXTROSE 2-4 GM/100ML-% IV SOLN
2.0000 g | INTRAVENOUS | Status: AC
  Administered 2017-03-15: 2 g via INTRAVENOUS

## 2017-03-15 MED ORDER — CEFAZOLIN SODIUM-DEXTROSE 2-4 GM/100ML-% IV SOLN
INTRAVENOUS | Status: AC
  Filled 2017-03-15: qty 100

## 2017-03-15 MED ORDER — METOCLOPRAMIDE HCL 5 MG PO TABS: 5.0000 mg | ORAL_TABLET | Freq: Three times a day (TID) | ORAL | Status: DC | PRN

## 2017-03-15 MED ORDER — FENTANYL CITRATE (PF) 100 MCG/2ML IJ SOLN
25.0000 ug | INTRAMUSCULAR | Status: DC | PRN
  Administered 2017-03-15 (×3): 50 ug via INTRAVENOUS

## 2017-03-15 MED ORDER — METOCLOPRAMIDE HCL 5 MG/ML IJ SOLN: 5.0000 mg | Freq: Three times a day (TID) | INTRAMUSCULAR | Status: DC | PRN

## 2017-03-15 MED ORDER — SODIUM CHLORIDE 0.9 % IV SOLN
INTRAVENOUS | Status: DC
  Administered 2017-03-15 – 2017-03-16 (×2): via INTRAVENOUS

## 2017-03-15 MED ORDER — MEPERIDINE HCL 50 MG/ML IJ SOLN: 6.2500 mg | INTRAMUSCULAR | Status: DC | PRN

## 2017-03-15 MED ORDER — FENTANYL CITRATE (PF) 100 MCG/2ML IJ SOLN
INTRAMUSCULAR | Status: DC | PRN
  Administered 2017-03-15: 50 ug via INTRAVENOUS

## 2017-03-15 MED ORDER — MENTHOL 3 MG MT LOZG
1.0000 | LOZENGE | OROMUCOSAL | Status: DC | PRN
  Filled 2017-03-15: qty 9

## 2017-03-15 MED ORDER — PHENYLEPHRINE 40 MCG/ML (10ML) SYRINGE FOR IV PUSH (FOR BLOOD PRESSURE SUPPORT)
PREFILLED_SYRINGE | INTRAVENOUS | Status: DC | PRN
  Administered 2017-03-15 (×3): 40 ug via INTRAVENOUS

## 2017-03-15 MED ORDER — METOCLOPRAMIDE HCL 5 MG/ML IJ SOLN: 10.0000 mg | Freq: Once | INTRAMUSCULAR | Status: DC | PRN

## 2017-03-15 MED ORDER — OXYCODONE HCL 5 MG PO TABS
10.0000 mg | ORAL_TABLET | ORAL | Status: DC | PRN
  Administered 2017-03-15: 5 mg via ORAL
  Administered 2017-03-15: 10 mg via ORAL
  Administered 2017-03-15: 5 mg via ORAL
  Administered 2017-03-16 – 2017-03-19 (×8): 10 mg via ORAL
  Filled 2017-03-15 (×11): qty 2

## 2017-03-15 MED ORDER — HYDROCODONE-ACETAMINOPHEN 5-325 MG PO TABS
1.0000 | ORAL_TABLET | ORAL | Status: DC | PRN
  Administered 2017-03-16: 23:00:00 2 via ORAL
  Administered 2017-03-16: 1 via ORAL
  Administered 2017-03-17: 13:00:00 2 via ORAL
  Administered 2017-03-17: 1 via ORAL
  Administered 2017-03-17: 05:00:00 2 via ORAL
  Administered 2017-03-17: 17:00:00 1 via ORAL
  Administered 2017-03-17: 10:00:00 2 via ORAL
  Administered 2017-03-18 (×4): 1 via ORAL
  Administered 2017-03-19: 2 via ORAL
  Filled 2017-03-15: qty 2
  Filled 2017-03-15 (×2): qty 1
  Filled 2017-03-15: qty 2
  Filled 2017-03-15 (×2): qty 1
  Filled 2017-03-15: qty 2
  Filled 2017-03-15 (×3): qty 1
  Filled 2017-03-15 (×2): qty 2

## 2017-03-15 MED ORDER — ONDANSETRON HCL 4 MG PO TABS: 4.0000 mg | ORAL_TABLET | Freq: Four times a day (QID) | ORAL | Status: DC | PRN

## 2017-03-15 MED ORDER — CEFAZOLIN SODIUM-DEXTROSE 2-4 GM/100ML-% IV SOLN: 2.0000 g | INTRAVENOUS | Status: DC

## 2017-03-15 MED ORDER — METHOCARBAMOL 500 MG PO TABS
500.0000 mg | ORAL_TABLET | Freq: Four times a day (QID) | ORAL | Status: DC | PRN
  Administered 2017-03-15 – 2017-03-19 (×8): 500 mg via ORAL
  Filled 2017-03-15 (×8): qty 1

## 2017-03-15 MED ORDER — DOCUSATE SODIUM 100 MG PO CAPS
100.0000 mg | ORAL_CAPSULE | Freq: Two times a day (BID) | ORAL | Status: DC
  Administered 2017-03-15 – 2017-03-19 (×8): 100 mg via ORAL
  Filled 2017-03-15 (×7): qty 1

## 2017-03-15 MED ORDER — PROPOFOL 10 MG/ML IV BOLUS
INTRAVENOUS | Status: AC
  Filled 2017-03-15: qty 60

## 2017-03-15 MED ORDER — EPHEDRINE SULFATE-NACL 50-0.9 MG/10ML-% IV SOSY
PREFILLED_SYRINGE | INTRAVENOUS | Status: DC | PRN
  Administered 2017-03-15 (×3): 5 mg via INTRAVENOUS
  Administered 2017-03-15: 10 mg via INTRAVENOUS
  Administered 2017-03-15: 5 mg via INTRAVENOUS
  Administered 2017-03-15: 10 mg via INTRAVENOUS

## 2017-03-15 SURGICAL SUPPLY — 41 items
APL SKNCLS STERI-STRIP NONHPOA (GAUZE/BANDAGES/DRESSINGS)
BAG SPEC THK2 15X12 ZIP CLS (MISCELLANEOUS) ×1
BAG ZIPLOCK 12X15 (MISCELLANEOUS) ×2 IMPLANT
BALL HIP CERAMIC (Hips) IMPLANT
BENZOIN TINCTURE PRP APPL 2/3 (GAUZE/BANDAGES/DRESSINGS) IMPLANT
BLADE SAW SGTL 18X1.27X75 (BLADE) ×2 IMPLANT
BLADE SAW SGTL 18X1.27X75MM (BLADE) ×1
CAPT HIP TOTAL 2 ×2 IMPLANT
CELLS DAT CNTRL 66122 CELL SVR (MISCELLANEOUS) IMPLANT
CLOSURE WOUND 1/2 X4 (GAUZE/BANDAGES/DRESSINGS)
COVER PERINEAL POST (MISCELLANEOUS) ×3 IMPLANT
COVER SURGICAL LIGHT HANDLE (MISCELLANEOUS) ×3 IMPLANT
DRAPE STERI IOBAN 125X83 (DRAPES) ×3 IMPLANT
DRAPE U-SHAPE 47X51 STRL (DRAPES) ×6 IMPLANT
DRSG AQUACEL AG ADV 3.5X10 (GAUZE/BANDAGES/DRESSINGS) ×3 IMPLANT
DURAPREP 26ML APPLICATOR (WOUND CARE) ×3 IMPLANT
ELECT REM PT RETURN 15FT ADLT (MISCELLANEOUS) ×3 IMPLANT
GAUZE XEROFORM 1X8 LF (GAUZE/BANDAGES/DRESSINGS) ×4 IMPLANT
GLOVE BIO SURGEON STRL SZ7.5 (GLOVE) ×3 IMPLANT
GLOVE BIOGEL PI IND STRL 8 (GLOVE) ×2 IMPLANT
GLOVE BIOGEL PI INDICATOR 8 (GLOVE) ×4
GLOVE ECLIPSE 8.0 STRL XLNG CF (GLOVE) ×3 IMPLANT
GOWN STRL REUS W/TWL XL LVL3 (GOWN DISPOSABLE) ×6 IMPLANT
HANDPIECE INTERPULSE COAX TIP (DISPOSABLE) ×3
HIP BALL CERAMIC (Hips) IMPLANT
HOLDER FOLEY CATH W/STRAP (MISCELLANEOUS) ×3 IMPLANT
PACK ANTERIOR HIP CUSTOM (KITS) ×3 IMPLANT
RETRACTOR WND ALEXIS 18 MED (MISCELLANEOUS) IMPLANT
RTRCTR WOUND ALEXIS 18CM MED (MISCELLANEOUS)
SET HNDPC FAN SPRY TIP SCT (DISPOSABLE) ×1 IMPLANT
STAPLER VISISTAT 35W (STAPLE) ×2 IMPLANT
STEM CORAIL KA11 (Stem) ×2 IMPLANT
STRIP CLOSURE SKIN 1/2X4 (GAUZE/BANDAGES/DRESSINGS) IMPLANT
SUT ETHIBOND NAB CT1 #1 30IN (SUTURE) ×3 IMPLANT
SUT MNCRL AB 4-0 PS2 18 (SUTURE) IMPLANT
SUT VIC AB 0 CT1 36 (SUTURE) ×3 IMPLANT
SUT VIC AB 1 CT1 36 (SUTURE) ×3 IMPLANT
SUT VIC AB 2-0 CT1 27 (SUTURE) ×6
SUT VIC AB 2-0 CT1 TAPERPNT 27 (SUTURE) ×2 IMPLANT
TRAY FOLEY W/METER SILVER 16FR (SET/KITS/TRAYS/PACK) ×1 IMPLANT
YANKAUER SUCT BULB TIP 10FT TU (MISCELLANEOUS) ×3 IMPLANT

## 2017-03-15 NOTE — Anesthesia Postprocedure Evaluation (Signed)
Anesthesia Post Note  Patient: Publishing copy  Procedure(s) Performed: RIGHT TOTAL HIP ARTHROPLASTY ANTERIOR APPROACH (Right Hip)     Patient location during evaluation: PACU Anesthesia Type: Spinal Level of consciousness: awake and alert Pain management: pain level controlled Vital Signs Assessment: post-procedure vital signs reviewed and stable Respiratory status: spontaneous breathing and respiratory function stable Cardiovascular status: blood pressure returned to baseline and stable Postop Assessment: no headache, no backache, spinal receding and no apparent nausea or vomiting Anesthetic complications: no    Last Vitals:  Vitals:   03/15/17 1426 03/15/17 1443  BP: 109/61 139/76  Pulse: (!) 53 (!) 54  Resp:  14  Temp: (!) 36.4 C (!) 36.4 C  SpO2: 100% 97%    Last Pain:  Vitals:   03/15/17 1415  TempSrc:   PainSc: 3                  Montez Hageman

## 2017-03-15 NOTE — Anesthesia Preprocedure Evaluation (Signed)
Anesthesia Evaluation  Patient identified by MRN, date of birth, ID band Patient awake    Reviewed: Allergy & Precautions, H&P , NPO status , Patient's Chart, lab work & pertinent test results, reviewed documented beta blocker date and time   Airway Mallampati: II  TM Distance: >3 FB Neck ROM: full    Dental no notable dental hx. (+)    Pulmonary neg pulmonary ROS,    Pulmonary exam normal breath sounds clear to auscultation       Cardiovascular hypertension, On Medications and Pt. on medications + CAD, + CABG and + Peripheral Vascular Disease  + dysrhythmias Atrial Fibrillation  Rhythm:regular Rate:Normal  Hx/o Aortic Dissection repaired 2012   Neuro/Psych  Neuromuscular disease    GI/Hepatic Neg liver ROS, GERD  ,  Endo/Other  Hypothyroidism Morbid obesity  Renal/GU Renal InsufficiencyRenal disease     Musculoskeletal negative musculoskeletal ROS (+) Arthritis ,   Abdominal   Peds  Hematology   Anesthesia Other Findings   Reproductive/Obstetrics negative OB ROS                             Anesthesia Physical  Anesthesia Plan  ASA: III  Anesthesia Plan: Spinal   Post-op Pain Management:    Induction:   PONV Risk Score and Plan: 2 and Ondansetron, Dexamethasone and Treatment may vary due to age or medical condition  Airway Management Planned: Simple Face Mask  Additional Equipment:   Intra-op Plan:   Post-operative Plan: Extubation in OR  Informed Consent: I have reviewed the patients History and Physical, chart, labs and discussed the procedure including the risks, benefits and alternatives for the proposed anesthesia with the patient or authorized representative who has indicated his/her understanding and acceptance.   Dental Advisory Given and Dental advisory given  Plan Discussed with: CRNA, Surgeon and Anesthesiologist  Anesthesia Plan Comments: (D)         Anesthesia Quick Evaluation

## 2017-03-15 NOTE — H&P (Signed)
TOTAL HIP ADMISSION H&P  Patient is admitted for right total hip arthroplasty.  Subjective:  Chief Complaint: right hip pain  HPI: Breanna Hernandez, 71 y.o. female, has a history of pain and functional disability in the right hip(s) due to arthritis and patient has failed non-surgical conservative treatments for greater than 12 weeks to include NSAID's and/or analgesics, corticosteriod injections, flexibility and strengthening excercises, supervised PT with diminished ADL's post treatment, use of assistive devices, weight reduction as appropriate and activity modification.  Onset of symptoms was gradual starting 2 years ago with gradually worsening course since that time.The patient noted no past surgery on the right hip(s).  Patient currently rates pain in the right hip at 10 out of 10 with activity. Patient has night pain, worsening of pain with activity and weight bearing, trendelenberg gait, pain that interfers with activities of daily living and pain with passive range of motion. Patient has evidence of subchondral cysts, subchondral sclerosis, periarticular osteophytes and joint space narrowing by imaging studies. This condition presents safety issues increasing the risk of falls.  There is no current active infection.  Patient Active Problem List   Diagnosis Date Noted  . Status post total replacement of right hip 03/15/2017  . History of malignant neoplasm of thyroid 02/07/2017  . Pre-operative cardiovascular examination 02/07/2017  . Abnormal EKG 02/07/2017  . Unilateral primary osteoarthritis, right hip 12/11/2016  . Pain of right hip joint 12/11/2016  . Bilateral cataracts 08/10/2016  . Numerous moles 05/10/2016  . CAD (coronary artery disease) 11/16/2015  . Papillary thyroid carcinoma ([place]) 11/16/2015  . Quality of life palliative care encounter 11/03/2015  . Elevated liver enzymes 10/06/2014  . Endometrial ca ([place]) 06/24/2014  . History of endometrial cancer 04/26/2014  .  Postmenopausal bleeding 04/13/2014  . Neuropathy 10/02/2013  . Exertional chest pain 08/09/2013  . Atrial fibrillation, postoperative only 06/25/2012  . History of aortic dissection   . Dyslipidemia 05/27/2009  . METABOLIC SYNDROME X 73/22/0254  . NONSPEC ELEVATION OF LEVELS OF TRANSAMINASE/LDH 05/27/2009  . Hypothyroidism 03/12/2008  . History of breast cancer 03/12/2008  . COLONIC POLYPS, HX OF 03/12/2008  . Essential hypertension 05/09/2006  . Esophageal reflux 05/09/2006   Past Medical History:  Diagnosis Date  . Arthritis   . Atrial fibrillation ([place])    h/o post-op 04/2010  . Breast cancer ([place])    x 3 cancer - radiation right x2 ''00,'05, left '07 mammosite radiation   . Diverticulosis   . Dysrhythmia    a fib  . Fatty liver 01/10/07  . GERD (gastroesophageal reflux disease)    hx of  . Headache    migraines   . 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 HTN (hypertension)   . Hyperlipidemia   . Hyperplastic colon polyp   . Hypothyroidism   . IBS (irritable bowel syndrome)   . Lower extremity neuropathy    occ. affects walking and balance"foot to knees" nondiabetic  . Obesity   . Renal cyst, left   . Thyroid cancer ([place]) 11/2015   surgery Oct 2017    Past Surgical History:  Procedure Laterality Date  . BREAST LUMPECTOMY  2000   Right x2, Left x1  . CHOLECYSTECTOMY    . COLONOSCOPY W/ POLYPECTOMY    . CORONARY ARTERY BYPASS GRAFT  05/03/2010   CABG X1, RCA -Dr Roxan Hockey (with type 1 aortic dissection)  . HYSTEROSCOPY W/D&C N/A 04/13/2014   Procedure: DILATATION AND CURETTAGE /HYSTEROSCOPY Polypectomy;  Surgeon: Maeola Sarah.  Landry Mellow, MD;  Location: [facility];  Service: Gynecology;  Laterality: N/A;  Possible Polypectomy  . JOINT REPLACEMENT     Right total hip 03-15-17 Dr. Ninfa Linden  . Median sternotomy, extracorporeal circulation, repair of type 1 aortic dissection with  tube graft from  sinotubular junction to take off of innominate artery, distal anastomoses  under deep bypothermic circulatory arrest  05/03/2010   Dr Roxan Hockey  . ROBOTIC ASSISTED TOTAL HYSTERECTOMY WITH BILATERAL SALPINGO OOPHERECTOMY Bilateral 05/18/2014   Procedure: ROBOTIC ASSISTED TOTAL HYSTERECTOMY WITH BILATERAL SALPINGO OOPHORECTOMY SENTINAL LYMPH NODE MAPPING ;  Surgeon: Everitt Amber, MD;  Location: [facility];  Service: Gynecology;  Laterality: Bilateral;  . THYROIDECTOMY N/A 01/09/2016   Procedure: TOTAL THYROIDECTOMY;  Surgeon: Armandina Gemma, MD;  Location: [facility];  Service: General;  Laterality: N/A;  . TONSILLECTOMY    . TOTAL THYROIDECTOMY  01/09/2016    No current facility-administered medications for this encounter.    No Known Allergies  Social History   Tobacco Use  . Smoking status: Never Smoker  . Smokeless tobacco: Never Used  Substance Use Topics  . Alcohol use: No    Family History  Problem Relation Age of Onset  . Aneurysm Father        CNS  . Heart attack Father        x2  . Alzheimer's disease Mother   . Hypertension Mother   . Goiter Mother   . Hypertension Brother   . Kidney disease Brother   . Colon cancer Neg Hx      Review of Systems  Musculoskeletal: Positive for joint pain.  All other systems reviewed and are negative.   Objective:  Physical Exam  Constitutional: She is oriented to person, place, and time. She appears well-developed and well-nourished.  HENT:  Head: Normocephalic and atraumatic.  Eyes: EOM are normal. Pupils are equal, round, and reactive to light.  Neck: Normal range of motion. Neck supple.  Cardiovascular: Normal rate.  Respiratory: Effort normal and breath sounds normal.  GI: Soft. Bowel sounds are normal.  Musculoskeletal:       Right hip: She exhibits decreased range of motion, decreased strength, tenderness and bony tenderness.  Neurological: She is alert and oriented to person, place, and time.  Skin: Skin is warm and dry.  Psychiatric: She has a normal mood and affect.    Vital signs in last 24  hours: Temp:  [97.6 F (36.4 C)] 97.6 F (36.4 C) (12/06 1127) Pulse Rate:  [55] 55 (12/06 1127) Resp:  [16] 16 (12/06 1127) BP: (138)/(60) 138/60 (12/06 1127) SpO2:  [96 %] 96 % (12/06 1127) Weight:  [253 lb (114.8 kg)] 253 lb (114.8 kg) (12/06 1127)  Labs:   Estimated body mass index is 43.43 kg/m as calculated from the following:   Height as of 03/14/17: 5\' 4"  (1.626 m).   Weight as of 03/14/17: 253 lb (114.8 kg).   Imaging Review Plain radiographs demonstrate severe degenerative joint disease of the right hip(s). The bone quality appears to be good for age and reported activity level.  Assessment/Plan:  End stage arthritis, right hip(s)  The patient history, physical examination, clinical judgement of the provider and imaging studies are consistent with end stage degenerative joint disease of the right hip(s) and total hip arthroplasty is deemed medically necessary. The treatment options including medical management, injection therapy, arthroscopy and arthroplasty were discussed at length. The risks and benefits of total hip arthroplasty were presented and reviewed. The risks due to aseptic loosening,  infection, stiffness, dislocation/subluxation,  thromboembolic complications and other imponderables were discussed.  The patient acknowledged the explanation, agreed to proceed with the plan and consent was signed. Patient is being admitted for inpatient treatment for surgery, pain control, PT, OT, prophylactic antibiotics, VTE prophylaxis, progressive ambulation and ADL's and discharge planning.The patient is planning to be discharged to be determined pending her progress after surgery.

## 2017-03-15 NOTE — Anesthesia Procedure Notes (Signed)
Date/Time: 03/15/2017 11:15 AM Performed by: Claudia Desanctis, CRNA Oxygen Delivery Method: Simple face mask

## 2017-03-15 NOTE — Transfer of Care (Signed)
Immediate Anesthesia Transfer of Care Note  Patient: Breanna Hernandez  Procedure(s) Performed: RIGHT TOTAL HIP ARTHROPLASTY ANTERIOR APPROACH (Right Hip)  Patient Location: PACU  Anesthesia Type:Spinal  Level of Consciousness: awake, alert , oriented and patient cooperative  Airway & Oxygen Therapy: Patient Spontanous Breathing and Patient connected to face mask  Post-op Assessment: Report given to RN and Post -op Vital signs reviewed and stable  Post vital signs: Reviewed and stable  Last Vitals:  Vitals:   03/15/17 0917  BP: 121/60  Pulse: (!) 51  Resp: 18  Temp: 36.6 C  SpO2: 95%    Last Pain:  Vitals:   03/15/17 0917  TempSrc: Oral      Patients Stated Pain Goal: 4 (34/19/37 9024)  Complications: No apparent anesthesia complications

## 2017-03-15 NOTE — Anesthesia Procedure Notes (Signed)
Spinal  Start time: 03/15/2017 11:13 AM End time: 03/15/2017 11:15 AM Staffing Anesthesiologist: Montez Hageman, MD Performed: anesthesiologist  Preanesthetic Checklist Completed: patient identified, site marked, surgical consent, pre-op evaluation, timeout performed, IV checked, risks and benefits discussed and monitors and equipment checked Spinal Block Patient position: sitting Prep: ChloraPrep Patient monitoring: heart rate, continuous pulse ox and blood pressure Approach: right paramedian Location: L3-4 Injection technique: single-shot Needle Needle type: Pencan  Needle gauge: 24 G Needle length: 10 cm Needle insertion depth: 9 cm Assessment Sensory level: T6

## 2017-03-15 NOTE — Brief Op Note (Signed)
03/15/2017  12:32 PM  PATIENT:  [name]  71 y.o. female  PRE-OPERATIVE DIAGNOSIS:  osteoarthritis right hip  POST-OPERATIVE DIAGNOSIS:  osteoarthritis right hip  PROCEDURE:  Procedure(s): RIGHT TOTAL HIP ARTHROPLASTY ANTERIOR APPROACH (Right)  SURGEON:  Surgeon(s) and Role:    Mcarthur Rossetti, MD - Primary  PHYSICIAN ASSISTANT: Benita Stabile, PA-C  ANESTHESIA:   spinal  EBL:  350 mL   COUNTS:  YES  DICTATION: .Other Dictation: Dictation Number [id number]  PLAN OF CARE: Admit to inpatient   PATIENT DISPOSITION:  Stable to PACU   Delay start of Pharmacological VTE agent (>24hrs) due to surgical blood loss or risk of bleeding: no

## 2017-03-15 NOTE — Plan of Care (Signed)
df

## 2017-03-16 LAB — BASIC METABOLIC PANEL
ANION GAP: 9 (ref 5–15)
BUN: 14 mg/dL (ref 6–20)
CO2: 26 mmol/L (ref 22–32)
Calcium: 8.5 mg/dL — ABNORMAL LOW (ref 8.9–10.3)
Chloride: 100 mmol/L — ABNORMAL LOW (ref 101–111)
Creatinine, Ser: 0.77 mg/dL (ref 0.44–1.00)
GFR calc Af Amer: >60 mL/min (ref >60)
GLUCOSE: 186 mg/dL — AB (ref 65–99)
POTASSIUM: 4 mmol/L (ref 3.5–5.1)
Sodium: 135 mmol/L (ref 135–145)

## 2017-03-16 LAB — CBC
HEMATOCRIT: 33 % — AB (ref 36.0–46.0)
Hemoglobin: 11.4 g/dL — ABNORMAL LOW (ref 12.0–15.0)
MCH: 34.9 pg — AB (ref 26.0–34.0)
MCHC: 34.5 g/dL (ref 30.0–36.0)
MCV: 100.9 fL — AB (ref 78.0–100.0)
PLATELETS: 151 10*3/uL (ref 150–400)
RBC: 3.27 MIL/uL — AB (ref 3.87–5.11)
RDW: 12.4 % (ref 11.5–15.5)
WBC: 15.2 10*3/uL — ABNORMAL HIGH (ref 4.0–10.5)

## 2017-03-16 MED ORDER — ASPIRIN 325 MG PO TBEC: 325.0000 mg | DELAYED_RELEASE_TABLET | Freq: Every day | ORAL | 0 refills | Status: DC

## 2017-03-16 MED ORDER — GABAPENTIN 100 MG PO CAPS
100.0000 mg | ORAL_CAPSULE | Freq: Three times a day (TID) | ORAL | Status: DC
  Administered 2017-03-16 – 2017-03-19 (×10): 100 mg via ORAL
  Filled 2017-03-16 (×10): qty 1

## 2017-03-16 MED ORDER — OXYCODONE-ACETAMINOPHEN 5-325 MG PO TABS: 1.0000 | ORAL_TABLET | ORAL | 0 refills | Status: DC | PRN

## 2017-03-16 NOTE — Evaluation (Signed)
Physical Therapy Evaluation Patient Details Name: Breanna Hernandez MRN: [id number] DOB: October 03, 1945 Today's Date: 03/16/2017   History of Present Illness  71yo F s/p R THR and with hx of A-fib and breast CA  Clinical Impression  Pt s/p R THR and presents with decreased R LE strength/ROM, post op pain and obesity limiting functional mobility. Pt should progress to dc home with family assist.    Follow Up Recommendations Home health PT    Equipment Recommendations  None recommended by PT    Recommendations for Other Services OT consult     Precautions / Restrictions Precautions Precautions: Fall Restrictions Weight Bearing Restrictions: No Other Position/Activity Restrictions: WBAT      Mobility  Bed Mobility Overal bed mobility: Needs Assistance Bed Mobility: Supine to Sit     Supine to sit: Mod assist;+2 for physical assistance;+2 for safety/equipment     General bed mobility comments: Increased time with cues for sequence and use of L LE to self assist  Transfers Overall transfer level: Needs assistance Equipment used: Rolling walker (2 wheeled) Transfers: Sit to/from Stand Sit to Stand: +2 physical assistance;+2 safety/equipment;From elevated surface;Min assist;Mod assist         General transfer comment: cues for LE management and use of UEs to self assist  Ambulation/Gait Ambulation/Gait assistance: Min assist;Mod assist;+2 safety/equipment Ambulation Distance (Feet): 7 Feet Assistive device: Rolling walker (2 wheeled) Gait Pattern/deviations: Step-to pattern;Decreased step length - right;Decreased step length - left;Shuffle;Trunk flexed Gait velocity: decr   General Gait Details: cues for sequence, posture and position from RW.  Physical assist to advance L LE  Stairs            Wheelchair Mobility    Modified Rankin (Stroke Patients Only)       Balance Overall balance assessment: Needs assistance Sitting-balance support: Bilateral upper  extremity supported;Feet supported Sitting balance-Leahy Scale: Fair     Standing balance support: Bilateral upper extremity supported Standing balance-Leahy Scale: Poor                               Pertinent Vitals/Pain Pain Assessment: 0-10 Pain Score: 6  Pain Location: right leg thigh through ankle Pain Descriptors / Indicators: Burning;Aching;Sore Pain Intervention(s): Limited activity within patient's tolerance;Monitored during session;Premedicated before session;Ice applied    Home Living Family/patient expects to be discharged to:: Private residence Living Arrangements: Spouse/significant other Available Help at Discharge: Family Type of Home: House Home Access: Stairs to enter Entrance Stairs-Rails: None Entrance Stairs-Number of Steps: 2 Home Layout: Multi-level;Able to live on main level with bedroom/bathroom Home Equipment: Gilford Rile - 2 wheels;Cane - single point;Shower seat - built in;Grab bars - tub/shower;Hand held shower head Additional Comments: toilet is elevated    Prior Function Level of Independence: Independent with assistive device(s)         Comments: has been using a RW since September     Hand Dominance        Extremity/Trunk Assessment   Upper Extremity Assessment Upper Extremity Assessment: Overall WFL for tasks assessed    Lower Extremity Assessment Lower Extremity Assessment: RLE deficits/detail RLE Deficits / Details: Strength at hip 2/5 with AAROM at hip to 70 flex and 10 abd    Cervical / Trunk Assessment Cervical / Trunk Assessment: Kyphotic  Communication   Communication: No difficulties  Cognition Arousal/Alertness: Awake/alert Behavior During Therapy: WFL for tasks assessed/performed Overall Cognitive Status: Within Functional Limits for tasks assessed  General Comments      Exercises Total Joint Exercises Ankle Circles/Pumps: AROM;Both;15  reps;Supine Quad Sets: AROM;Both;10 reps;Supine Heel Slides: AAROM;Right;15 reps;Supine Hip ABduction/ADduction: AAROM;Right;15 reps;Supine   Assessment/Plan    PT Assessment Patient needs continued PT services  PT Problem List Decreased strength;Decreased range of motion;Decreased activity tolerance;Decreased balance;Decreased mobility;Decreased knowledge of use of DME;Obesity;Pain       PT Treatment Interventions DME instruction;Gait training;Stair training;Functional mobility training;Therapeutic activities;Therapeutic exercise;Patient/family education    PT Goals (Current goals can be found in the Care Plan section)  Acute Rehab PT Goals Patient Stated Goal: decreased pain PT Goal Formulation: With patient Time For Goal Achievement: 03/23/17 Potential to Achieve Goals: Good    Frequency 7X/week   Barriers to discharge        Co-evaluation               AM-PAC PT "6 Clicks" Daily Activity  Outcome Measure Difficulty turning over in bed (including adjusting bedclothes, sheets and blankets)?: Unable Difficulty moving from lying on back to sitting on the side of the bed? : Unable Difficulty sitting down on and standing up from a chair with arms (e.g., wheelchair, bedside commode, etc,.)?: Unable Help needed moving to and from a bed to chair (including a wheelchair)?: A Lot Help needed walking in hospital room?: A Lot Help needed climbing 3-5 steps with a railing? : Total 6 Click Score: 8    End of Session Equipment Utilized During Treatment: Gait belt Activity Tolerance: Patient limited by fatigue;Patient limited by pain Patient left: in chair;with call bell/phone within reach Nurse Communication: Mobility status PT Visit Diagnosis: Difficulty in walking, not elsewhere classified (R26.2);Pain Pain - Right/Left: Right Pain - part of body: Hip    Time: 1205-1237 PT Time Calculation (min) (ACUTE ONLY): 32 min   Charges:   PT Evaluation $PT Eval Low Complexity:  1 Low PT Treatments $Therapeutic Exercise: 8-22 mins   PT G Codes:        Pg [phone]   Brayley Mackowiak 03/16/2017, 4:01 PM

## 2017-03-16 NOTE — Discharge Instructions (Signed)

## 2017-03-16 NOTE — Progress Notes (Signed)
     Subjective: 1 Day Post-Op Procedure(s) (LRB): RIGHT TOTAL HIP ARTHROPLASTY ANTERIOR APPROACH (Right)Awake, alert and oriented x 4. Has husband to assist at home, inclimate weather expected tonight and tomorrow potentially delaying HHN and HHPT, Mobilize today. Complaints of numbness and discomfort right lateral and anterior thigh.  Patient reports pain as moderate.    Objective:   VITALS:  Temp:  [97.4 F (36.3 C)-97.8 F (36.6 C)] 97.5 F (36.4 C) (12/08 0555) Pulse Rate:  [50-86] 57 (12/08 0555) Resp:  [9-20] 20 (12/08 0555) BP: (95-139)/(46-79) 117/55 (12/08 0555) SpO2:  [95 %-100 %] 98 % (12/08 0555) Weight:  [253 lb (114.8 kg)] 253 lb (114.8 kg) (12/07 1445)  Neurologically intact ABD soft Neurovascular intact Sensation intact distally Intact pulses distally Dorsiflexion/Plantar flexion intact Incision: dressing C/D/I   LABS Recent Labs    03/14/17 1154 03/16/17 0510  HGB 13.2 11.4*  WBC 7.7 15.2*  PLT 167 151   Recent Labs    03/14/17 1154 03/16/17 0510  NA 137 135  K 3.8 4.0  CL 102 100*  CO2 28 26  BUN 17 14  CREATININE 0.80 0.77  GLUCOSE 105* 186*   No results for input(s): LABPT, INR in the last 72 hours.   Assessment/Plan: 1 Day Post-Op Procedure(s) (LRB): RIGHT TOTAL HIP ARTHROPLASTY ANTERIOR APPROACH (Right)  Advance diet Up with therapy D/C IV fluids  Assess post PT and OT, to determine whether she is stable Likely Discharge tomorrow or next day.  Basil Dess 03/16/2017, 8:56 AMPatient ID: Breanna Hernandez, female   DOB: 1946/03/16, 71 y.o.   MRN: [id number]

## 2017-03-16 NOTE — Progress Notes (Signed)
Physical Therapy Treatment Patient Details Name: Breanna Hernandez MRN: [id number] DOB: December 31, 1945 Today's Date: 03/16/2017    History of Present Illness 71yo F s/p R THR and with hx of A-fib and breast CA    PT Comments    Pt cooperative but progressing slowly with mobility 2* c/o pain, nausea and fatigue.   Follow Up Recommendations  Home health PT     Equipment Recommendations  None recommended by PT    Recommendations for Other Services OT consult     Precautions / Restrictions Precautions Precautions: Fall Restrictions Weight Bearing Restrictions: No Other Position/Activity Restrictions: WBAT    Mobility  Bed Mobility Overal bed mobility: Needs Assistance Bed Mobility: Sit to Supine     Supine to sit: Mod assist;+2 for physical assistance;+2 for safety/equipment Sit to supine: Mod assist;+2 for physical assistance;+2 for safety/equipment   General bed mobility comments: Increased time with cues for sequence and use of L LE to self assist  Transfers Overall transfer level: Needs assistance Equipment used: Rolling walker (2 wheeled) Transfers: Sit to/from Stand Sit to Stand: +2 physical assistance;+2 safety/equipment;From elevated surface;Min assist;Mod assist         General transfer comment: cues for LE management and use of UEs to self assist  Ambulation/Gait Ambulation/Gait assistance: Min assist;Mod assist;+2 safety/equipment Ambulation Distance (Feet): 10 Feet Assistive device: Rolling walker (2 wheeled) Gait Pattern/deviations: Step-to pattern;Decreased step length - right;Decreased step length - left;Shuffle;Trunk flexed Gait velocity: decr Gait velocity interpretation: Below normal speed for age/gender General Gait Details: cues for sequence, posture and position from RW.  Physical assist to advance L LE   Stairs            Wheelchair Mobility    Modified Rankin (Stroke Patients Only)       Balance Overall balance assessment: Needs  assistance Sitting-balance support: Bilateral upper extremity supported;Feet supported Sitting balance-Leahy Scale: Fair     Standing balance support: Bilateral upper extremity supported Standing balance-Leahy Scale: Poor                              Cognition Arousal/Alertness: Awake/alert Behavior During Therapy: WFL for tasks assessed/performed Overall Cognitive Status: Within Functional Limits for tasks assessed                                        Exercises Total Joint Exercises Ankle Circles/Pumps: AROM;Both;15 reps;Supine Quad Sets: AROM;Both;10 reps;Supine Heel Slides: AAROM;Right;15 reps;Supine Hip ABduction/ADduction: AAROM;Right;15 reps;Supine    General Comments        Pertinent Vitals/Pain Pain Assessment: No/denies pain Pain Score: 6  Pain Location: right leg thigh through ankle Pain Descriptors / Indicators: Burning;Aching;Sore Pain Intervention(s): Limited activity within patient's tolerance;Monitored during session;Premedicated before session;Ice applied    Home Living Family/patient expects to be discharged to:: Private residence Living Arrangements: Spouse/significant other Available Help at Discharge: Family Type of Home: House Home Access: Stairs to enter Entrance Stairs-Rails: None Home Layout: Multi-level;Able to live on main level with bedroom/bathroom Home Equipment: Gilford Rile - 2 wheels;Cane - single point;Shower seat - built in;Grab bars - tub/shower;Hand held shower head Additional Comments: toilet is elevated    Prior Function Level of Independence: Independent with assistive device(s)      Comments: has been using a RW since September   PT Goals (current goals can now be found in the care plan section)  Acute Rehab PT Goals Patient Stated Goal: decreased pain PT Goal Formulation: With patient Time For Goal Achievement: 03/23/17 Potential to Achieve Goals: Good Progress towards PT goals: Progressing toward  goals    Frequency    7X/week      PT Plan Current plan remains appropriate    Co-evaluation              AM-PAC PT "6 Clicks" Daily Activity  Outcome Measure  Difficulty turning over in bed (including adjusting bedclothes, sheets and blankets)?: Unable Difficulty moving from lying on back to sitting on the side of the bed? : Unable Difficulty sitting down on and standing up from a chair with arms (e.g., wheelchair, bedside commode, etc,.)?: Unable Help needed moving to and from a bed to chair (including a wheelchair)?: A Lot Help needed walking in hospital room?: A Lot Help needed climbing 3-5 steps with a railing? : Total 6 Click Score: 8    End of Session Equipment Utilized During Treatment: Gait belt Activity Tolerance: Patient limited by fatigue;Patient limited by pain Patient left: with call bell/phone within reach;in bed Nurse Communication: Mobility status PT Visit Diagnosis: Difficulty in walking, not elsewhere classified (R26.2);Pain Pain - Right/Left: Right Pain - part of body: Hip     Time: 0211-1735 PT Time Calculation (min) (ACUTE ONLY): 21 min  Charges:  $Gait Training: 8-22 mins $Therapeutic Exercise: 8-22 mins                    G Codes:       Pg [phone]    Legrande Hao 03/16/2017, 4:14 PM

## 2017-03-16 NOTE — Progress Notes (Signed)
Occupational Therapy Evaluation Patient Details Name: Breanna Hernandez MRN: [id number] DOB: 1945/08/29 Today's Date: 03/16/2017    History of Present Illness 34yoF s/p R THR, AA   Clinical Impression   Patient presents to OT with decreased ADL independence due to the deficits listed below. She was very limited by pain today. Began education on LB self-care, toileting, walk-in shower transfer this session. She likely will need a 3 in 1 for home. She already has an elevated toilet but it does not have arms. OT will follow per plan of care. Patient plans to d/c home with husband's assistance and was asking for home health therapies.    Follow Up Recommendations  DC plan and follow up therapy as arranged by surgeon;Supervision/Assistance - 24 hour;Home health OT    Equipment Recommendations  3 in 1 bedside commode    Recommendations for Other Services PT consult     Precautions / Restrictions Precautions Precautions: Fall Restrictions Weight Bearing Restrictions: No Other Position/Activity Restrictions: WBAT      Mobility Bed Mobility               General bed mobility comments: significant pain moving RLE in bed  Transfers                 General transfer comment: pt declined until pain medication starts to work    Balance                                           ADL either performed or assessed with clinical judgement   ADL Overall ADL's : Needs assistance/impaired Eating/Feeding: Independent;Bed level   Grooming: Set up;Bed level                     Toilet Transfer Details (indicate cue type and reason): still has catheter in place           General ADL Comments: Patient in tears upon arrival; reports burning pain right thigh through ankle, especially with movement. Patient educated on LB self-care techniques, role of OT, verbal education of walk-in shower transfer, verbal education of toilet transfer. At home her toilet is  elevated and there is a vanity on one side and a wall on one side. She will decide if she wants a 3 in 1 for home once she practices transfer. Patient reports that her husband can assist her with LB self-care but educated on starting pants/underwear in sitting, donning operated leg first, then standing to pull everything up. Patient verbalized understanding.     Vision         Perception     Praxis      Pertinent Vitals/Pain Pain Assessment: 0-10 Pain Score: 7  Pain Location: right leg thigh through ankle Pain Descriptors / Indicators: Burning;Aching;Sore Pain Intervention(s): Limited activity within patient's tolerance;Monitored during session;Premedicated before session;RN gave pain meds during session     Hand Dominance     Extremity/Trunk Assessment Upper Extremity Assessment Upper Extremity Assessment: Overall WFL for tasks assessed   Lower Extremity Assessment Lower Extremity Assessment: Defer to PT evaluation       Communication Communication Communication: No difficulties   Cognition Arousal/Alertness: Awake/alert Behavior During Therapy: WFL for tasks assessed/performed Overall Cognitive Status: Within Functional Limits for tasks assessed  General Comments       Exercises     Shoulder Instructions      Home Living Family/patient expects to be discharged to:: Private residence Living Arrangements: Spouse/significant other Available Help at Discharge: Family Type of Home: House Home Access: Stairs to enter [employer] of Steps: 2 Entrance Stairs-Rails: None Home Layout: Multi-level;Able to live on main level with bedroom/bathroom(pt stays on main level)     Bathroom Shower/Tub: Occupational psychologist: Standard Bathroom Accessibility: Yes How Accessible: Accessible via walker Home Equipment: [place] - 2 wheels;Cane - single point;Shower seat - built in;Grab bars - tub/shower;Hand  held shower head   Additional Comments: toilet is elevated      Prior Functioning/Environment Level of Independence: Independent with assistive device(s)        Comments: has been using a RW since September        OT Problem List: Decreased strength;Decreased activity tolerance;Decreased knowledge of use of DME or AE;Obesity;Pain      OT Treatment/Interventions: Self-care/ADL training;DME and/or AE instruction;Therapeutic activities;Patient/family education    OT Goals(Current goals can be found in the care plan section) Acute Rehab OT Goals Patient Stated Goal: decreased pain OT Goal Formulation: With patient Time For Goal Achievement: 03/30/17 Potential to Achieve Goals: Good ADL Goals Pt Will Perform Lower Body Bathing: with min assist;with caregiver independent in assisting;sit to/from stand Pt Will Perform Lower Body Dressing: with min assist;with caregiver independent in assisting;sit to/from stand Pt Will Transfer to Toilet: with supervision;ambulating;bedside commode Pt Will Perform Toileting - Clothing Manipulation and hygiene: with supervision;sit to/from stand Pt Will Perform Tub/Shower Transfer: with min assist;Shower transfer;ambulating;shower seat;rolling walker  OT Frequency: Min 2X/week   Barriers to D/C:            Co-evaluation              AM-PAC PT "6 Clicks" Daily Activity     Outcome Measure Help from another person eating meals?: None Help from another person taking care of personal grooming?: A Lot Help from another person toileting, which includes using toliet, bedpan, or urinal?: Total Help from another person bathing (including washing, rinsing, drying)?: A Lot Help from another person to put on and taking off regular upper body clothing?: A Little Help from another person to put on and taking off regular lower body clothing?: A Lot 6 Click Score: 14   End of Session Nurse Communication: Patient requests pain meds  Activity  Tolerance: Patient limited by pain Patient left: in bed;with call bell/phone within reach;with bed alarm set;with nursing/sitter in room  OT Visit Diagnosis: Muscle weakness (generalized) (M62.81);Pain Pain - Right/Left: Right Pain - part of body: Hip                Time: 3570-1779 OT Time Calculation (min): 19 min Charges:  OT General Charges $OT Visit: 1 Visit OT Evaluation $OT Eval Low Complexity: 1 Low G-Codes:      Ashtan Girtman A Ketara Cavness 03-19-17, 11:23 AM

## 2017-03-17 NOTE — Care Management Note (Signed)
Case Management Note  Patient Details  Name: Breanna Hernandez MRN: [id number] Date of Birth: 28-Aug-1945  Subjective/Objective:   S/p R THA               Action/Plan: Discharge Planning: NCM spoke to pt and offered choice for HH/list provided. Pt requested [facility] for HH. Contacted [facility] with new referral. Made [facility] rep aware pt will need 3n1 bedside commode to be delivered to room prior to dc.  PCP LOWNE Koren Shiver MD  Expected Discharge Date:                Expected Discharge Plan:  [place]  In-House Referral:  NA  Discharge planning Services  CM Consult  Post Acute Care Choice:  Home Health Choice offered to:  Patient  DME Arranged:  N/A DME Agency:  NA  HH Arranged:  PT [place] Agency:  [facility] ([facility])  Status of Service:     If discussed at [employer], dates discussed:    Additional Comments:  Erenest Rasher, RN 03/17/2017, 10:31 AM

## 2017-03-17 NOTE — Op Note (Signed)
NAME:  Breanna Hernandez, Breanna Hernandez                   ACCOUNT NO.:  MEDICAL RECORD NO.:  [id number]  LOCATION:                                 FACILITY:  PHYSICIAN:  Lind Guest. Ninfa Linden, M.D.DATE OF BIRTH:  DATE OF PROCEDURE:  03/15/2017 DATE OF DISCHARGE:                              OPERATIVE REPORT   PREOPERATIVE DIAGNOSIS:  Primary osteoarthritis and degenerative joint disease of the right hip.  POSTOPERATIVE DIAGNOSIS:  Primary osteoarthritis and degenerative joint disease of the right hip.  PROCEDURE:  Right total hip arthroplasty through direct anterior approach.  IMPLANTS:  DePuy Sector Gription acetabular component size 50, size 32 +4 polyethylene liner, size 11 Corail femoral component with standard offset, size 32 +1 ceramic hip ball.  SURGEON:  Lind Guest. Ninfa Linden, M.D.  ASSISTANT:  Erskine Emery, P.A.  ANESTHESIA:  Spinal.  ANTIBIOTICS:  3 g of IV Ancef.  BLOOD LOSS:  350 mL.  COMPLICATIONS:  None.  INDICATIONS:  Breanna Hernandez is a 71 year old morbidly obese female with debilitating arthritis involving her right hip.  She has tried and failed all forms of conservative treatment.  She has well documented arthritis in this hip on clinical exam and plain x-rays.  Her pain is daily and it has detrimentally affected her activities of daily living, her quality of life, and her mobility to the point she does wish to proceed with a total hip arthroplasty.  We talked to her about the risk of acute blood loss anemia, nerve and vessel injury, fracture, infection, dislocation, and DVT.  She understands our goals are to decrease pain, improve mobility, and overall improve quality of life.  PROCEDURE DESCRIPTION:  After informed consent was obtained, appropriate right hip was marked.  She was brought to the operating room, where spinal anesthesia was obtained while she was on her stretcher.  She was laid in a supine position on the stretcher, a Foley catheter was  placed, and both feet had traction boots applied to them.  Next, she was placed supine on the Hana fracture table with the perineal post in place and both legs in InLine skeletal traction devices, but no traction applied. Her right operative hip was prepped and draped with DuraPrep and sterile drapes.  Time-out was called and she was identified as correct patient and correct right hip.  We then made an incision just inferior and posterior to the anterior superior iliac spine and carried this straight down the leg.  We dissected down tensor fascia lata muscle.  Tensor fascia was then divided longitudinally to proceed with a direct anterior approach to the hip.  We identified and cauterized circumflex vessels, then identified the hip capsule.  I opened up the hip capsule in an L- type format, finding moderate joint effusion and significant periarticular osteophytes and arthritis in the hip.  We placed Cobra retractors on the medial and lateral femoral neck and then made our femoral neck cut with an oscillating saw proximal to the lesser trochanter and completed this with an osteotome.  We placed a corkscrew guide in the femoral head and removed the femoral head in its entirety and found to have a wide area devoid of cartilage.  I then placed a bent Hohmann over the medial acetabular rim and removed remnants of the acetabular labrum and other debris.  We then began reaming under direct visualization from a size 42 reamer going up to a size 50 with all reamers under direct visualization, the last reamer under direct fluoroscopy as well, so we could obtain our depth of reaming, our inclination and anteversion.  Once we were pleased with this, we placed a real DePuy Sector Gription acetabular component size 50 and a 32 +4 polyethylene liner since I felt like we had medialized the cup.  We then had attention turned to the femur.  With the leg externally rotated to 120 degrees, extended, and  adducted, we were able to place a Mueller retractor medially and a Hohmann retractor behind the greater trochanter.  We released the lateral joint capsule and used a box cutting osteotome to enter the inner femoral canal and a rongeur to lateralize.  We then began broaching from a size 8 broach using the Corail broaching system going up to size 11.  With a 11 in place, we trialed a standard offset femoral neck and a 32 +1 hip ball.  We rolled the leg back over and up with traction and internal rotation, reducing the pelvis.  We were pleased with the stability of the hip with range of motion and offset.  We then dislocated the hip and removed the trial components.  We were able to place the real Corail femoral component with standard offset and a real 32 +1 hip ball, reduced this in the acetabulum and was stable.  We then irrigated the soft tissue with normal saline solution.  There was no joint capsule to close.  We closed the iliotibial band with a running #1 Vicryl suture followed by 0 Vicryl in the deep tissue, 2-0 Vicryl in the subcutaneous tissue, interrupted staples on the skin.  Xeroform and Aquacel dressing were applied.  She was taken off the Hana table and taken to the recovery room in stable condition.  All final counts were correct.  There were no complications noted.     Lind Guest. Ninfa Linden, M.D.     CYB/MEDQ  D:  03/15/2017  T:  03/16/2017  Job:  [id number]

## 2017-03-17 NOTE — Progress Notes (Signed)
Physical Therapy Treatment Patient Details Name: Breanna Hernandez MRN: [id number] DOB: [birth year] Today's Date: 03/17/2017    History of Present Illness 71yo F s/p R THR and with hx of A-fib and breast CA    PT Comments    Pt progressing with mobility, she ambulated 77' today with RW and min A. Performed R THA exercises with min A.   Follow Up Recommendations  Home health PT     Equipment Recommendations  None recommended by PT    Recommendations for Other Services OT consult     Precautions / Restrictions Precautions Precautions: Fall Restrictions Other Position/Activity Restrictions: WBAT    Mobility  Bed Mobility Overal bed mobility: Needs Assistance Bed Mobility: Supine to Sit       Sit to supine: Min assist;+2 for physical assistance;HOB elevated   General bed mobility comments: heavy reliance on bedrail.  Cues for technique.  Assist for Rt LE   Transfers Overall transfer level: Needs assistance Equipment used: Rolling walker (2 wheeled) Transfers: Sit to/from [facility] Sit to Stand: Min assist;+2 safety/equipment Stand pivot transfers: Min assist;+2 safety/equipment       General transfer comment: cues for hand placement, and for sequencing.  Assist to steady   Ambulation/Gait Ambulation/Gait assistance: +2 safety/equipment;Min guard;Min assist Ambulation Distance (Feet): 30 Feet Assistive device: Rolling walker (2 wheeled) Gait Pattern/deviations: Step-to pattern;Decreased step length - right;Decreased step length - left;Shuffle;Trunk flexed     General Gait Details: cues for sequence, posture and position from RW.  Distance limited by fatigue.   Stairs            Wheelchair Mobility    Modified Rankin (Stroke Patients Only)       Balance Overall balance assessment: Needs assistance Sitting-balance support: Feet supported Sitting balance-Leahy Scale: Good     Standing balance support: Single extremity  supported;During functional activity Standing balance-Leahy Scale: Poor                              Cognition Arousal/Alertness: Awake/alert Behavior During Therapy: WFL for tasks assessed/performed Overall Cognitive Status: Within Functional Limits for tasks assessed                                        Exercises Total Joint Exercises Ankle Circles/Pumps: AROM;Both;15 reps;Supine Quad Sets: AROM;Both;10 reps;Supine Short Arc Quad: AROM;Right;10 reps;Supine Heel Slides: AAROM;Right;15 reps;Supine Hip ABduction/ADduction: AAROM;Right;Supine;10 reps    General Comments        Pertinent Vitals/Pain Pain Assessment: Faces Pain Score: 7  Faces Pain Scale: Hurts even more Pain Location: Back, Rt hip radiating into thigh  Pain Descriptors / Indicators: Burning;Aching;Sore Pain Intervention(s): Limited activity within patient's tolerance;Monitored during session;Repositioned;Premedicated before session    Home Living                      Prior Function            PT Goals (current goals can now be found in the care plan section) Acute Rehab PT Goals Patient Stated Goal: decreased pain PT Goal Formulation: With patient Time For Goal Achievement: 03/23/17 Potential to Achieve Goals: Good    Frequency    7X/week      PT Plan Current plan remains appropriate    Co-evaluation PT/OT/SLP Co-Evaluation/Treatment: Yes Reason for Co-Treatment: For patient/therapist safety PT goals addressed during  session: Mobility/safety with mobility;Balance;Proper use of DME;Strengthening/ROM        AM-PAC PT "6 Clicks" Daily Activity  Outcome Measure  Difficulty turning over in bed (including adjusting bedclothes, sheets and blankets)?: Unable Difficulty moving from lying on back to sitting on the side of the bed? : Unable Difficulty sitting down on and standing up from a chair with arms (e.g., wheelchair, bedside commode, etc,.)?:  Unable Help needed moving to and from a bed to chair (including a wheelchair)?: A Lot Help needed walking in hospital room?: A Lot Help needed climbing 3-5 steps with a railing? : Total 6 Click Score: 8    End of Session Equipment Utilized During Treatment: Gait belt Activity Tolerance: Patient limited by fatigue;Patient limited by pain Patient left: with call bell/phone within reach;in chair Nurse Communication: Mobility status PT Visit Diagnosis: Difficulty in walking, not elsewhere classified (R26.2);Pain Pain - Right/Left: Right Pain - part of body: Hip     Time: 7121-9758 PT Time Calculation (min) (ACUTE ONLY): 27 min  Charges:  $Gait Training: 8-22 mins                    G Codes:         Philomena Doheny 03/17/2017, 8:35 AM [phone]

## 2017-03-17 NOTE — Progress Notes (Signed)
Physical Therapy Treatment Patient Details Name: Breanna Hernandez MRN: [id number] DOB: 02-May-1945 Today's Date: 03/17/2017    History of Present Illness 71yo F s/p R THR and with hx of A-fib and breast CA    PT Comments    Assisted pt to bedside commode then to bed. Performed THA exercises with min A. Pt groggy from pain meds and reported she was too tired to walk again.   Follow Up Recommendations  Home health PT     Equipment Recommendations  None recommended by PT    Recommendations for Other Services OT consult     Precautions / Restrictions Precautions Precautions: Fall Restrictions Other Position/Activity Restrictions: WBAT    Mobility  Bed Mobility Overal bed mobility: Needs Assistance Bed Mobility: Sit to Supine       Sit to supine: Min assist   General bed mobility comments: min A for LEs into bed  Transfers Overall transfer level: Needs assistance Equipment used: Rolling walker (2 wheeled) Transfers: Sit to/from [facility] Sit to Stand: Min assist Stand pivot transfers: Min assist       General transfer comment: cues for hand placement, and for sequencing.  Assist to steady, SPT x 2 (recliner to [facility] then to bed)  Ambulation/Gait Ambulation/Gait assistance: +2 safety/equipment;Min guard;Min assist Ambulation Distance (Feet): 30 Feet Assistive device: Rolling walker (2 wheeled) Gait Pattern/deviations: Shuffle;Trunk flexed     General Gait Details: cues for sequence, posture and position from RW.  Distance limited by fatigue.   Stairs            Wheelchair Mobility    Modified Rankin (Stroke Patients Only)       Balance Overall balance assessment: Needs assistance Sitting-balance support: Feet supported Sitting balance-Leahy Scale: Good     Standing balance support: Single extremity supported;During functional activity Standing balance-Leahy Scale: Poor                              Cognition  Arousal/Alertness: Awake/alert Behavior During Therapy: WFL for tasks assessed/performed Overall Cognitive Status: Within Functional Limits for tasks assessed                                        Exercises Total Joint Exercises Ankle Circles/Pumps: AROM;Both;15 reps;Supine Quad Sets: AROM;Both;10 reps;Supine Short Arc Quad: AROM;Right;10 reps;Supine Heel Slides: AAROM;Right;15 reps;Supine Hip ABduction/ADduction: AAROM;Right;Supine;10 reps    General Comments        Pertinent Vitals/Pain Pain Assessment: Faces Pain Score: 7  Faces Pain Scale: Hurts even more Pain Location: Rt hip radiating into thigh  Pain Descriptors / Indicators: Burning;Aching;Sore Pain Intervention(s): Limited activity within patient's tolerance;Monitored during session;Premedicated before session;Ice applied    Home Living                      Prior Function            PT Goals (current goals can now be found in the care plan section) Acute Rehab PT Goals Patient Stated Goal: decreased pain PT Goal Formulation: With patient Time For Goal Achievement: 03/23/17 Potential to Achieve Goals: Good Progress towards PT goals: Progressing toward goals    Frequency    7X/week      PT Plan Current plan remains appropriate    Co-evaluation PT/OT/SLP Co-Evaluation/Treatment: Yes Reason for Co-Treatment: For patient/therapist safety PT goals addressed during  session: Mobility/safety with mobility;Balance;Proper use of DME;Strengthening/ROM        AM-PAC PT "6 Clicks" Daily Activity  Outcome Measure  Difficulty turning over in bed (including adjusting bedclothes, sheets and blankets)?: Unable Difficulty moving from lying on back to sitting on the side of the bed? : Unable Difficulty sitting down on and standing up from a chair with arms (e.g., wheelchair, bedside commode, etc,.)?: Unable Help needed moving to and from a bed to chair (including a wheelchair)?: A  Little Help needed walking in hospital room?: A Lot Help needed climbing 3-5 steps with a railing? : Total 6 Click Score: 9    End of Session Equipment Utilized During Treatment: Gait belt Activity Tolerance: Patient limited by fatigue;Patient limited by pain Patient left: with call bell/phone within reach;in bed;with bed alarm set Nurse Communication: Mobility status PT Visit Diagnosis: Difficulty in walking, not elsewhere classified (R26.2);Pain Pain - Right/Left: Right Pain - part of body: Hip     Time: 7564-3329 PT Time Calculation (min) (ACUTE ONLY): 19 min  Charges:  $Gait Training: 8-22 mins $Therapeutic Exercise: 8-22 mins                    G Codes:          Philomena Doheny 03/17/2017, 11:23 AM [phone]

## 2017-03-17 NOTE — Progress Notes (Signed)
Occupational Therapy Treatment Patient Details Name: Breanna Hernandez MRN: [id number] DOB: November 11, 1945 Today's Date: 03/17/2017    History of present illness 71yo F s/p R THR and with hx of A-fib and breast CA   OT comments  Pt is making slow, steady progress.  She requires min - mod A for ADLs, and min A +2 (for safety) for functional mobility.     Follow Up Recommendations  DC plan and follow up therapy as arranged by surgeon;Supervision/Assistance - 24 hour;Home health OT    Equipment Recommendations  3 in 1 bedside commode    Recommendations for Other Services      Precautions / Restrictions Precautions Precautions: Fall Restrictions Other Position/Activity Restrictions: WBAT       Mobility Bed Mobility Overal bed mobility: Needs Assistance Bed Mobility: Supine to Sit       Sit to supine: Min assist;+2 for physical assistance;HOB elevated   General bed mobility comments: heavy reliance on bedrail.  Cues for technique.  Assist for Rt LE   Transfers Overall transfer level: Needs assistance Equipment used: Rolling walker (2 wheeled) Transfers: Sit to/from [facility] Sit to Stand: Min assist;+2 safety/equipment Stand pivot transfers: Min assist;+2 safety/equipment       General transfer comment: cues for hand placement, and for sequencing.  Assist to steady     Balance Overall balance assessment: Needs assistance Sitting-balance support: Feet supported Sitting balance-Leahy Scale: Good     Standing balance support: Single extremity supported;During functional activity Standing balance-Leahy Scale: Poor                             ADL either performed or assessed with clinical judgement   ADL Overall ADL's : Needs assistance/impaired             Lower Body Bathing: Moderate assistance;Sit to/from stand Lower Body Bathing Details (indicate cue type and reason): Pt able to bed forward toward Mid calf to bathe LEs      Lower  Body Dressing: Maximal assistance;Sit to/from stand   Toilet Transfer: Minimal assistance;+2 for safety/equipment;Ambulation;BSC;RW Toilet Transfer Details (indicate cue type and reason): min A          Functional mobility during ADLs: Minimal assistance;+2 for safety/equipment;Rolling walker       Vision       Perception     Praxis      Cognition Arousal/Alertness: Awake/alert Behavior During Therapy: WFL for tasks assessed/performed Overall Cognitive Status: Within Functional Limits for tasks assessed                                          Exercises Total Joint Exercises Ankle Circles/Pumps: AROM;Both;15 reps;Supine Quad Sets: AROM;Both;10 reps;Supine Short Arc Quad: AROM;Right;10 reps;Supine Heel Slides: AAROM;Right;15 reps;Supine Hip ABduction/ADduction: AAROM;Right;Supine;10 reps   Shoulder Instructions       General Comments      Pertinent Vitals/ Pain       Pain Assessment: Faces Pain Score: 7  Faces Pain Scale: Hurts even more Pain Location: Back, Rt hip radiating into thigh  Pain Descriptors / Indicators: Burning;Aching;Sore Pain Intervention(s): Limited activity within patient's tolerance;Monitored during session;Repositioned;Premedicated before session  Home Living  Prior Functioning/Environment              Frequency  Min 2X/week        Progress Toward Goals  OT Goals(current goals can now be found in the care plan section)  Progress towards OT goals: Progressing toward goals  Acute Rehab OT Goals Patient Stated Goal: decreased pain  Plan Discharge plan remains appropriate    Co-evaluation      Reason for Co-Treatment: For patient/therapist safety PT goals addressed during session: Mobility/safety with mobility;Balance;Proper use of DME;Strengthening/ROM        AM-PAC PT "6 Clicks" Daily Activity     Outcome Measure   Help from another person  eating meals?: None Help from another person taking care of personal grooming?: A Little Help from another person toileting, which includes using toliet, bedpan, or urinal?: A Lot Help from another person bathing (including washing, rinsing, drying)?: A Lot Help from another person to put on and taking off regular upper body clothing?: A Little Help from another person to put on and taking off regular lower body clothing?: A Lot 6 Click Score: 16    End of Session Equipment Utilized During Treatment: Rolling walker;Gait belt  OT Visit Diagnosis: Muscle weakness (generalized) (M62.81);Pain Pain - Right/Left: Right Pain - part of body: Hip   Activity Tolerance Patient tolerated treatment well   Patient Left in chair;with call bell/phone within reach   Nurse Communication Mobility status        Time: 3762-8315 OT Time Calculation (min): 23 min  Charges: OT General Charges $OT Visit: 1 Visit OT Treatments $Self Care/Home Management : 8-22 mins  [facility], OTR/L [phone]    Lucille Passy M 03/17/2017, 9:09 AM

## 2017-03-18 MED ORDER — BISACODYL 10 MG RE SUPP
10.0000 mg | Freq: Once | RECTAL | Status: AC
  Administered 2017-03-18: 10 mg via RECTAL
  Filled 2017-03-18: qty 1

## 2017-03-18 MED ORDER — GABAPENTIN 100 MG PO CAPS: 100.0000 mg | ORAL_CAPSULE | Freq: Three times a day (TID) | ORAL | 0 refills | Status: DC

## 2017-03-18 MED ORDER — GABAPENTIN 100 MG PO CAPS: 100.0000 mg | ORAL_CAPSULE | Freq: Three times a day (TID) | ORAL | 1 refills | Status: DC

## 2017-03-18 NOTE — Progress Notes (Signed)
Physical Therapy Treatment Patient Details Name: Breanna Hernandez MRN: [id number] DOB: 11-01-1945 Today's Date: 03/18/2017    History of Present Illness 71yo F s/p R THR and with hx of A-fib and breast CA    PT Comments    Pt performed exercises in recliner and able to improve ambulation distance today.  Pt reports hopeful for d/c home tomorrow.     Follow Up Recommendations  Home health PT     Equipment Recommendations  None recommended by PT    Recommendations for Other Services       Precautions / Restrictions Precautions Precautions: Fall Restrictions Other Position/Activity Restrictions: WBAT    Mobility  Bed Mobility               General bed mobility comments: pt up in recliner on arrival  Transfers Overall transfer level: Needs assistance Equipment used: Rolling walker (2 wheeled) Transfers: Sit to/from Stand Sit to Stand: Min guard         General transfer comment: verbal cues for safe technique  Ambulation/Gait Ambulation/Gait assistance: Min guard Ambulation Distance (Feet): 80 Feet Assistive device: Rolling walker (2 wheeled) Gait Pattern/deviations: Step-to pattern;Decreased stance time - right Gait velocity: decr   General Gait Details: cues for sequence, posture and position from RW.  Distance limited by fatigue.  Recliner following for safety   Stairs            Wheelchair Mobility    Modified Rankin (Stroke Patients Only)       Balance                                            Cognition Arousal/Alertness: Awake/alert Behavior During Therapy: WFL for tasks assessed/performed Overall Cognitive Status: Within Functional Limits for tasks assessed                                        Exercises Total Joint Exercises Ankle Circles/Pumps: AROM;Both;10 reps Quad Sets: AROM;Both;10 reps;Supine Towel Squeeze: AROM;Both;10 reps Heel Slides: AAROM;Right;15 reps;Supine Hip  ABduction/ADduction: AAROM;Right;Supine;10 reps Long Arc Quad: AROM;Right;Seated;10 reps    General Comments        Pertinent Vitals/Pain Pain Assessment: 0-10 Pain Score: 7  Pain Location: Rt hip radiating into thigh  Pain Descriptors / Indicators: Burning Pain Intervention(s): Limited activity within patient's tolerance;Monitored during session;Ice applied    Home Living                      Prior Function            PT Goals (current goals can now be found in the care plan section) Progress towards PT goals: Progressing toward goals    Frequency    7X/week      PT Plan Current plan remains appropriate    Co-evaluation              AM-PAC PT "6 Clicks" Daily Activity  Outcome Measure  Difficulty turning over in bed (including adjusting bedclothes, sheets and blankets)?: A Lot Difficulty moving from lying on back to sitting on the side of the bed? : A Lot Difficulty sitting down on and standing up from a chair with arms (e.g., wheelchair, bedside commode, etc,.)?: A Lot Help needed moving to and from a bed to chair (including  a wheelchair)?: A Little Help needed walking in hospital room?: A Little Help needed climbing 3-5 steps with a railing? : A Lot 6 Click Score: 14    End of Session Equipment Utilized During Treatment: Gait belt Activity Tolerance: Patient tolerated treatment well Patient left: with call bell/phone within reach;in chair   PT Visit Diagnosis: Difficulty in walking, not elsewhere classified (R26.2);Pain Pain - Right/Left: Right Pain - part of body: Hip     Time: 8270-7867 PT Time Calculation (min) (ACUTE ONLY): 21 min  Charges:  $Gait Training: 8-22 mins                    G Codes:       Carmelia Bake, PT, DPT 03/18/2017 Pager: [phone]  York Ram E 03/18/2017, 12:39 PM

## 2017-03-18 NOTE — Progress Notes (Signed)
[facility] rep alerted of need for 3in1 for home. [facility] to deliver 3in1 to room prior to DC. Marney Doctor RN,BSN,NCM [phone]

## 2017-03-18 NOTE — Discharge Summary (Signed)
Patient ID: Breanna Hernandez MRN: [id number] DOB/AGE: 12-22-1945 71 y.o.  Admit date: 03/15/2017 Discharge date: 03/18/2017  Admission Diagnoses:  Principal Problem:   Status post total replacement of right hip   Discharge Diagnoses:  Same  Past Medical History:  Diagnosis Date  . Arthritis   . Atrial fibrillation ([place])    h/o post-op 04/2010  . Breast cancer ([place])    x 3 cancer - radiation right x2 ''00,'05, left '07 mammosite radiation   . Diverticulosis   . Dysrhythmia    a fib  . Fatty liver 01/10/07  . GERD (gastroesophageal reflux disease)    hx of  . Headache    migraines   . 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 HTN (hypertension)   . Hyperlipidemia   . Hyperplastic colon polyp   . Hypothyroidism   . IBS (irritable bowel syndrome)   . Lower extremity neuropathy    occ. affects walking and balance"foot to knees" nondiabetic  . Obesity   . Renal cyst, left   . Thyroid cancer ([place]) 11/2015   surgery Oct 2017    Surgeries: Procedure(s): RIGHT TOTAL HIP ARTHROPLASTY ANTERIOR APPROACH on 03/15/2017   Consultants:   Discharged Condition: Improved  Hospital Course: Breanna Hernandez is an 71 y.o. female who was admitted 03/15/2017 for operative treatment ofStatus post total replacement of right hip. Patient has severe unremitting pain that affects sleep, daily activities, and work/hobbies. After pre-op clearance the patient was taken to the operating room on 03/15/2017 and underwent  Procedure(s): RIGHT TOTAL HIP ARTHROPLASTY ANTERIOR APPROACH.    Patient was given perioperative antibiotics:  Anti-infectives (From admission, onward)   Start     Dose/Rate Route Frequency Ordered Stop   03/15/17 1700  ceFAZolin (ANCEF) IVPB 1 g/50 mL premix     1 g 100 mL/hr over 30 Minutes Intravenous Every 6 hours 03/15/17 1451 03/15/17 2319   03/15/17 0945  ceFAZolin (ANCEF) IVPB 2g/100 mL premix     2 g 200 mL/hr over 30 Minutes Intravenous On call to O.R. 03/15/17  0938 03/15/17 1123   03/15/17 0935  ceFAZolin (ANCEF) 2-4 GM/100ML-% IVPB    Comments:  Bridget Hartshorn   : cabinet override      03/15/17 0935 03/15/17 1113   03/15/17 0926  ceFAZolin (ANCEF) IVPB 2g/100 mL premix  Status:  Discontinued     2 g 200 mL/hr over 30 Minutes Intravenous On call to O.R. 03/15/17 2426 03/15/17 0926   03/15/17 0925  ceFAZolin (ANCEF) 3 g in dextrose 5 % 50 mL IVPB  Status:  Discontinued     3 g 130 mL/hr over 30 Minutes Intravenous On call to O.R. 03/15/17 8341 03/15/17 9622       Patient was given sequential compression devices, early ambulation, and chemoprophylaxis to prevent DVT.  Patient benefited maximally from hospital stay and there were no complications.    Recent vital signs:  Patient Vitals for the past 24 hrs:  BP Temp Temp src Pulse Resp SpO2  03/18/17 0621 (!) 162/66 99.4 F (37.4 C) Oral 72 18 95 %  03/17/17 2316 131/62 99.2 F (37.3 C) Oral (!) 59 16 95 %  03/17/17 1430 (!) 109/46 98.3 F (36.8 C) Oral 69 17 95 %     Recent laboratory studies:  Recent Labs    03/16/17 0510  WBC 15.2*  HGB 11.4*  HCT 33.0*  PLT 151  NA 135  K 4.0  CL 100*  CO2 26  BUN 14  CREATININE 0.77  GLUCOSE 186*  CALCIUM 8.5*     Discharge Medications:   Allergies as of 03/18/2017   No Known Allergies     Medication List    STOP taking these medications   ibuprofen 200 MG tablet Commonly known as:  ADVIL,MOTRIN     TAKE these medications   aspirin 325 MG EC tablet Take 1 tablet (325 mg total) by mouth daily with breakfast. What changed:    medication strength  how much to take  when to take this   benazepril 40 MG tablet Commonly known as:  LOTENSIN Take 1 tablet (40 mg total) by mouth daily.   CALCIUM-MAGNESIUM-VITAMIN D PO Take 1 tablet by mouth 2 (two) times daily.   desonide 0.05 % cream Commonly known as:  DESOWEN Apply 1 application topically 3 (three) times a week.   FISH OIL PO Take 1 capsule by mouth 2 (two)  times daily.   gabapentin 100 MG capsule Commonly known as:  NEURONTIN Take 1 capsule (100 mg total) by mouth 3 (three) times daily.   hydrochlorothiazide 12.5 MG capsule Commonly known as:  MICROZIDE Take 2 capsules (25 mg total) by mouth daily.   levothyroxine 175 MCG tablet Commonly known as:  SYNTHROID, LEVOTHROID TAKE 1 TABLET (175 MCG TOTAL) BY MOUTH DAILY BEFORE BREAKFAST.   LORazepam 1 MG tablet Commonly known as:  ATIVAN TAKE 1 TABLET BY MOUTH AS NEEDED FOR ANXIETY What changed:  See the new instructions.   metoprolol tartrate 50 MG tablet Commonly known as:  LOPRESSOR TAKE 1 TABLET BY MOUTH TWICE A DAY. NEEDS APPOINTMENT FOR FUTURE REFILLS What changed:  See the new instructions.   OVER THE COUNTER MEDICATION Take 1 tablet by mouth every morning. Product Name: EHT Mind enhancement formula Vitamin d 200 units Vitamin b 1.6 mg Vitamin b-12 10 mcg Selinum 70 mcg Coffee Extract 35 mg Alpha leporic acid 50 mg huperzine-a 50 mcg   oxyCODONE-acetaminophen 5-325 MG tablet Commonly known as:  ROXICET Take 1-2 tablets by mouth every 4 (four) hours as needed.   PROBIOTIC PO Take 1 capsule by mouth 2 (two) times daily.            Durable Medical Equipment  (From admission, onward)        Start     Ordered   03/15/17 1452  DME 3 n 1  Once     03/15/17 1451   03/15/17 1452  DME Walker rolling  Once    Question:  Patient needs a walker to treat with the following condition  Answer:  Status post total replacement of right hip   03/15/17 1451      Diagnostic Studies: Dg Pelvis Portable  Result Date: 03/15/2017 CLINICAL DATA:  Right hip replacement. EXAM: PORTABLE PELVIS 1-2 VIEWS COMPARISON:  11/12/2016 . FINDINGS: Total right hip replacement. Hardware intact. Anatomic alignment. No acute bony abnormality. IMPRESSION: Total right hip replacement with anatomic alignment. Electronically Signed   By: Marcello Moores  Register   On: 03/15/2017 13:42   Dg C-arm 1-60 Min-no  Report  Result Date: 03/15/2017 Fluoroscopy was utilized by the requesting physician.  No radiographic interpretation.   Dg Hip Operative Unilat With Pelvis Right  Result Date: 03/15/2017 CLINICAL DATA:  RIGHT hip replacement EXAM: OPERATIVE RIGHT HIP (WITH PELVIS IF PERFORMED) 10 VIEWS TECHNIQUE: Fluoroscopic spot image(s) were submitted for interpretation post-operatively. COMPARISON:  11/12/2016 FLUOROSCOPY TIME:  0 minutes 42 seconds Dose:  3.29 mGy FINDINGS: Osseous demineralization. RIGHT hip prosthesis  identified without fracture or dislocation. Visualized pelvis intact. IMPRESSION: RIGHT hip prosthesis without acute complication. Electronically Signed   By: Lavonia Dana M.D.   On: 03/15/2017 12:52    Disposition: 01-Home or Self Care  Discharge Instructions    Discharge patient   Complete by:  As directed    Can discharge to home this afternoon weather permitting.   Discharge disposition:  01-Home or Self Care   Discharge patient date:  03/18/2017      Follow-up Information    Mcarthur Rossetti, MD Follow up in 2 week(s).   Specialty:  Orthopedic Surgery Contact information: [place] [address] [facility].   Why:  will deliver a 3n1 bedside commode to your prior to discharge Contact information: [place] [address] [phone]        [facility] Follow [facility].   Specialty:  [place] Why:  Home Health Physical Therapy- agency will call to arrange initial visit Contact information: [address] [place] [address] [phone]            Signed: Mcarthur Rossetti 03/18/2017, 8:58 AM
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
## 2017-03-18 NOTE — Progress Notes (Signed)
Subjective: 3 Days Post-Op Procedure(s) (LRB): RIGHT TOTAL HIP ARTHROPLASTY ANTERIOR APPROACH (Right) Patient reports pain as moderate.  Some burning pain.  Working well with therapy.  Vitals and labs have been stable.  Objective: Vital signs in last 24 hours: Temp:  [98.3 F (36.8 C)-99.4 F (37.4 C)] 99.4 F (37.4 C) (12/10 0621) Pulse Rate:  [59-72] 72 (12/10 0621) Resp:  [16-18] 18 (12/10 0621) BP: (109-162)/(46-66) 162/66 (12/10 0621) SpO2:  [95 %] 95 % (12/10 0621)  Intake/Output from previous day: 12/09 0701 - 12/10 0700 In: 2280 [P.O.:2280] Out: 650 [Urine:650] Intake/Output this shift: No intake/output data recorded.  Recent Labs    03/16/17 0510  HGB 11.4*   Recent Labs    03/16/17 0510  WBC 15.2*  RBC 3.27*  HCT 33.0*  PLT 151   Recent Labs    03/16/17 0510  NA 135  K 4.0  CL 100*  CO2 26  BUN 14  CREATININE 0.77  GLUCOSE 186*  CALCIUM 8.5*   No results for input(s): LABPT, INR in the last 72 hours.  Sensation intact distally Intact pulses distally Dorsiflexion/Plantar flexion intact Incision: dressing C/D/I No cellulitis present Compartment soft  Assessment/Plan: 3 Days Post-Op Procedure(s) (LRB): RIGHT TOTAL HIP ARTHROPLASTY ANTERIOR APPROACH (Right) Up with therapy Discharge home with home health this afternoon.  Mcarthur Rossetti 03/18/2017, 8:55 AM

## 2017-03-19 NOTE — Progress Notes (Signed)
Occupational Therapy Treatment Patient Details Name: Breanna Hernandez MRN: [id number] DOB: [birth year] Today's Date: 03/19/2017    History of present illness 71yo F s/p R THR and with hx of A-fib and breast CA   OT comments  All OT education completed and pt questions answered. No further OT needs at this time. Husband to assist with ADLs as needed. OT will sign off.   Follow Up Recommendations  DC plan and follow up therapy as arranged by surgeon;Supervision/Assistance - 24 hour;Home health OT    Equipment Recommendations  3 in 1 bedside commode    Recommendations for Other Services      Precautions / Restrictions Precautions Precautions: Fall       Mobility Bed Mobility                  Transfers                      Balance                                           ADL either performed or assessed with clinical judgement   ADL Overall ADL's : Needs assistance/impaired                                       General ADL Comments: Upon arrival, pt's husband was assisting patient with getting dressed as she prepared for discharge home. Patient able to recite verbally technique for shower transfer. She declined to practice. Shower transfer handout provided to patient. Patient reports she has been toileting with RW and 3 in 1 over toilet with no difficulty. All questions answered and all OT education completed.     Vision       Perception     Praxis      Cognition Arousal/Alertness: Awake/alert Behavior During Therapy: WFL for tasks assessed/performed Overall Cognitive Status: Within Functional Limits for tasks assessed                                          Exercises     Shoulder Instructions       General Comments      Pertinent Vitals/ Pain       Pain Assessment: 0-10 Pain Score: 2  Pain Location: Rt hip radiating into thigh  Pain Descriptors / Indicators: Burning Pain  Intervention(s): Limited activity within patient's tolerance;Monitored during session;Ice applied  Home Living                                          Prior Functioning/Environment              Frequency           Progress Toward Goals  OT Goals(current goals can now be found in the care plan section)  Progress towards OT goals: Goals met/education completed, patient discharged from [place] All goals met and education completed, patient discharged from OT services    Co-evaluation  AM-PAC PT "6 Clicks" Daily Activity     Outcome Measure   Help from another person eating meals?: None Help from another person taking care of personal grooming?: A Little Help from another person toileting, which includes using toliet, bedpan, or urinal?: A Little Help from another person bathing (including washing, rinsing, drying)?: A Little Help from another person to put on and taking off regular upper body clothing?: None Help from another person to put on and taking off regular lower body clothing?: A Little 6 Click Score: 20    End of Session Equipment Utilized During Treatment: Rolling walker  OT Visit Diagnosis: Muscle weakness (generalized) (M62.81);Pain Pain - Right/Left: Right Pain - part of body: Hip   Activity Tolerance Patient tolerated treatment well   Patient Left in chair;with call bell/phone within reach;with family/visitor present   Nurse Communication Other (comment)(pt d/c from OT)        Time: 1120-1146 OT Time Calculation (min): 26 min  Charges: OT General Charges $OT Visit: 1 Visit OT Treatments $Self Care/Home Management : 23-37 mins     Rhyan Radler A Merrit Friesen 03/19/2017, 12:30 PM

## 2017-03-19 NOTE — Progress Notes (Signed)
Patient verbalized understanding of discharge instructions. Prescriptions were given.

## 2017-03-19 NOTE — Progress Notes (Signed)
Patient ID: Breanna Hernandez, female   DOB: 28-Jul-1945, 71 y.o.   MRN: [id number] No acute changes.  Can go home today.

## 2017-03-19 NOTE — Progress Notes (Signed)
Physical Therapy Treatment Patient Details Name: Breanna Hernandez MRN: [id number] DOB: [birth year] Today's Date: 03/19/2017    History of Present Illness 71yo F s/p R THR and with hx of A-fib and breast CA    PT Comments    Pt ambulated in hallway and performed LE exercises.  Pt provided with HEP handout and had no further questions.  Pt reports her husband can pull up to back door which is a level entry, so she declines need to practice steps.  Pt feels ready for d/c home today.    Follow Up Recommendations  Home health PT     Equipment Recommendations  None recommended by PT    Recommendations for Other Services       Precautions / Restrictions Precautions Precautions: Fall Restrictions Other Position/Activity Restrictions: WBAT    Mobility  Bed Mobility               General bed mobility comments: pt up in recliner on arrival  Transfers Overall transfer level: Needs assistance Equipment used: Rolling walker (2 wheeled) Transfers: Sit to/from Stand Sit to Stand: Min guard         General transfer comment: verbal cues for safe technique  Ambulation/Gait Ambulation/Gait assistance: Min guard Ambulation Distance (Feet): 100 Feet Assistive device: Rolling walker (2 wheeled) Gait Pattern/deviations: Step-to pattern;Decreased stance time - right Gait velocity: decr   General Gait Details: cues for sequence, posture and position from RW.  tolerated improved distance today   Stairs            Wheelchair Mobility    Modified Rankin (Stroke Patients Only)       Balance                                            Cognition Arousal/Alertness: Awake/alert Behavior During Therapy: WFL for tasks assessed/performed Overall Cognitive Status: Within Functional Limits for tasks assessed                                        Exercises Total Joint Exercises Ankle Circles/Pumps: AROM;Both;10 reps Quad Sets:  AROM;Both;10 reps;Supine Heel Slides: AAROM;Right;15 reps;Supine Hip ABduction/ADduction: AAROM;Right;Supine;10 reps Straight Leg Raises: AROM;Right;10 reps Long Arc Quad: AROM;Right;Seated;10 reps    General Comments        Pertinent Vitals/Pain Pain Assessment: 0-10 Pain Score: 2  Pain Location: Rt hip radiating into thigh  Pain Descriptors / Indicators: Burning Pain Intervention(s): Limited activity within patient's tolerance;Repositioned;Monitored during session    Home Living                      Prior Function            PT Goals (current goals can now be found in the care plan section) Progress towards PT goals: Progressing toward goals    Frequency    7X/week      PT Plan Current plan remains appropriate    Co-evaluation              AM-PAC PT "6 Clicks" Daily Activity  Outcome Measure  Difficulty turning over in bed (including adjusting bedclothes, sheets and blankets)?: A Little Difficulty moving from lying on back to sitting on the side of the bed? : A Little Difficulty sitting down on and standing  up from a chair with arms (e.g., wheelchair, bedside commode, etc,.)?: A Little Help needed moving to and from a bed to chair (including a wheelchair)?: A Little Help needed walking in hospital room?: A Little Help needed climbing 3-5 steps with a railing? : A Little 6 Click Score: 18    End of Session   Activity Tolerance: Patient tolerated treatment well Patient left: with call bell/phone within reach;in chair   PT Visit Diagnosis: Difficulty in walking, not elsewhere classified (R26.2);Pain Pain - Right/Left: Right Pain - part of body: Hip     Time: 1000-1021 PT Time Calculation (min) (ACUTE ONLY): 21 min  Charges:  $Gait Training: 8-22 mins                    G Codes:       Carmelia Bake, PT, DPT 03/19/2017 Pager: [phone]  York Ram E 03/19/2017, 12:48 PM

## 2017-03-20 ENCOUNTER — Telehealth: Payer: Self-pay

## 2017-03-20 ENCOUNTER — Telehealth (INDEPENDENT_AMBULATORY_CARE_PROVIDER_SITE_OTHER): Payer: Self-pay | Admitting: Orthopaedic Surgery

## 2017-03-20 MED ORDER — METHOCARBAMOL 500 MG PO TABS: 500.0000 mg | ORAL_TABLET | Freq: Four times a day (QID) | ORAL | 0 refills | Status: DC | PRN

## 2017-03-20 NOTE — Telephone Encounter (Signed)
Rx request 

## 2017-03-20 NOTE — Telephone Encounter (Signed)
°  Med refill  Robaxin pt stated its really helping with the muscle spasms

## 2017-03-20 NOTE — Telephone Encounter (Signed)
03/20/17   TCM Hospital follow up  Transition Care Management Follow-up Telephone Call  ADMISSION DATE: 03/15/17 [facility] DATE: 03/18/17  Note: Patient had Hip Replacement not sure when she will be able to come in for follow up appointment.   How have you been since you were released from the hospital?  [facility] as long as she takes pain medications per patient.   Do you understand why you were in the hospital? Yes   Do you understand the discharge instrcutions? Yes    Items Reviewed:  Medications reviewed: Yes   Allergies reviewed: NKDA   Dietary changes reviewed: Regular per patient   Referrals reviewed: Will not be able to see provider at this time will call when she can come in for Hospital Follow Up   Functional Questionnaire:   Activities of Daily Living (ADLs):  Unable to ambulate independently at this time. Needs assistance with all ADLs except feeding herself.    Any patient concerns?   None at this time per patient.   Confirmed importance and date/time of follow-up visits scheduled:Yes   Confirmed with patient if condition begins to worsen call PCP or go to the ER. Yes    Patient was given the office number and encouragred to call back with questions or concerns.Yes

## 2017-03-20 NOTE — Telephone Encounter (Signed)
I sent some in to [facility] in [place]

## 2017-03-21 ENCOUNTER — Telehealth (INDEPENDENT_AMBULATORY_CARE_PROVIDER_SITE_OTHER): Payer: Self-pay | Admitting: Orthopaedic Surgery

## 2017-03-22 ENCOUNTER — Telehealth (INDEPENDENT_AMBULATORY_CARE_PROVIDER_SITE_OTHER): Payer: Self-pay | Admitting: Orthopaedic Surgery

## 2017-03-22 NOTE — Telephone Encounter (Signed)
Breanna Hernandez with [facility] called needing verbal for (PT) 2 Wk 3   The number to contact Zenia Resides is [phone]

## 2017-03-22 NOTE — Telephone Encounter (Signed)
Verbal order given  

## 2017-03-22 NOTE — Telephone Encounter (Signed)
Patient aware of the message from [facility]

## 2017-03-22 NOTE — Telephone Encounter (Signed)
Please advise 

## 2017-03-22 NOTE — Telephone Encounter (Signed)
Patient called asked if she can ride in a car for 2 hours. Patient advised they will be stopping along the way. The number to contact patient is [phone]

## 2017-03-22 NOTE — Telephone Encounter (Signed)
That will be ok for her to ride in a car

## 2017-03-22 NOTE — Telephone Encounter (Signed)
error 

## 2017-03-31 ENCOUNTER — Other Ambulatory Visit (INDEPENDENT_AMBULATORY_CARE_PROVIDER_SITE_OTHER): Payer: Self-pay | Admitting: Orthopaedic Surgery

## 2017-04-03 ENCOUNTER — Telehealth: Payer: Self-pay | Admitting: Family Medicine

## 2017-04-03 NOTE — Telephone Encounter (Signed)
Ok for refill? 

## 2017-04-03 NOTE — Telephone Encounter (Signed)
Pt is requesting refill on lorazepam.   Last OV: 05/10/2016 Last Fill: 02/11/2017 #30 and 0RF UDS: None seen  Will you refill in PCP absence?

## 2017-04-04 ENCOUNTER — Telehealth (INDEPENDENT_AMBULATORY_CARE_PROVIDER_SITE_OTHER): Payer: Self-pay

## 2017-04-04 NOTE — Telephone Encounter (Signed)
[facility] called and needed verbal orders for patient PT 2x/week for 2 weeks.  CB: [phone]  Gave him an okay on the orders.

## 2017-04-04 NOTE — Telephone Encounter (Signed)
Patient called and Left voicemail on Triage phone states she had SU 03/15/17-R THA and incision is pink and feels irritated and dry. Has appt scheduled for Monday but would like to know if she can apply a cream or something? Would like a CB [phone]

## 2017-04-04 NOTE — Telephone Encounter (Signed)
Pt not seen in almost a year, will RF #15, further RF per PCP

## 2017-04-04 NOTE — Telephone Encounter (Signed)
Left detailed message on machine that in Dr. Ivy Lynn abscence that she was sent in 15 tabs.  She must make an appointment to follow up before next refill.

## 2017-04-05 NOTE — Telephone Encounter (Signed)
Try triple antibiotic ointment

## 2017-04-05 NOTE — Telephone Encounter (Signed)
IC s/w patient and advised  

## 2017-04-05 NOTE — Telephone Encounter (Signed)
Can you please advise?

## 2017-04-08 ENCOUNTER — Encounter (INDEPENDENT_AMBULATORY_CARE_PROVIDER_SITE_OTHER): Payer: Self-pay | Admitting: Orthopaedic Surgery

## 2017-04-08 ENCOUNTER — Ambulatory Visit (INDEPENDENT_AMBULATORY_CARE_PROVIDER_SITE_OTHER): Payer: Medicare Other | Admitting: Orthopaedic Surgery

## 2017-04-08 DIAGNOSIS — Z96641 Presence of right artificial hip joint: Secondary | ICD-10-CM

## 2017-04-08 MED ORDER — OXYCODONE-ACETAMINOPHEN 5-325 MG PO TABS: 1.0000 | ORAL_TABLET | Freq: Four times a day (QID) | ORAL | 0 refills | Status: DC | PRN

## 2017-04-08 NOTE — Progress Notes (Signed)
The patient is now 24 days status post a right total hip arthroplasty through direct anterior approach.  She is been on 325 mg aspirin daily and was on 81 mg as before this.  She does need a refill of her pain medication.  She is somewhat is moderately obese.  She needs a walker and therapy 1 to extend her home visits for a few more weeks.  On exam her incision looks good staples were removed and Steri-Strips applied.  Her body fold does fold over the  superior aspect of her incision so we will need to keep this area dry.  Also talked her about alternating between ice and heating pad.  Of note both her ankles and feet are swollen but her calves are soft.  Also given her prescription for TED hose bilaterally.  Her leg lengths appear equal.  At this point she will continue to increase her activities as she is comfortable.  I did refill her pain medication.  Again should go back to 81 mg aspirin daily.   we will see her back in 4 weeks to see how she is doing overall but no x-rays are needed.

## 2017-04-10 ENCOUNTER — Telehealth (INDEPENDENT_AMBULATORY_CARE_PROVIDER_SITE_OTHER): Payer: Self-pay

## 2017-04-10 NOTE — Telephone Encounter (Signed)
It can be related.  She should try to hydrate and even increase her potassium such as trying a banana twice daily for a few days.  She can try a muscle relaxer as well.

## 2017-04-10 NOTE — Telephone Encounter (Signed)
Patient aware of the below message from CB 

## 2017-04-10 NOTE — Telephone Encounter (Signed)
See below

## 2017-04-10 NOTE — Telephone Encounter (Signed)
Pt called  With complaints of foot cramping. S/p total hip 03/15/17 and cramping is on surgical side. States that it began Monday at her big toe and now the whole foot "cramps" and wants to know if this could be related to her surgery. Please call and advise. [phone]

## 2017-04-12 ENCOUNTER — Telehealth (INDEPENDENT_AMBULATORY_CARE_PROVIDER_SITE_OTHER): Payer: Self-pay

## 2017-04-12 NOTE — Telephone Encounter (Signed)
Continue her aspirin?

## 2017-04-15 NOTE — Telephone Encounter (Signed)
Sent discontinue fax to her pharmacy

## 2017-04-15 NOTE — Telephone Encounter (Signed)
Can stop the aspirin

## 2017-04-19 ENCOUNTER — Telehealth (INDEPENDENT_AMBULATORY_CARE_PROVIDER_SITE_OTHER): Payer: Self-pay | Admitting: Radiology

## 2017-04-19 NOTE — Telephone Encounter (Signed)
FYI---[facility] called and states that the patient had a GI bug this week and he is calling to get a verbal auth for 2 visits next due to this illness.  I gave him the verbal ok over the phone for this.

## 2017-05-07 ENCOUNTER — Telehealth: Payer: Self-pay | Admitting: Internal Medicine

## 2017-05-08 ENCOUNTER — Other Ambulatory Visit: Payer: Self-pay | Admitting: Family Medicine

## 2017-05-08 DIAGNOSIS — F419 Anxiety disorder, unspecified: Secondary | ICD-10-CM

## 2017-05-08 MED ORDER — LORAZEPAM 1 MG PO TABS: ORAL_TABLET | ORAL | 1 refills | Status: DC

## 2017-05-08 NOTE — Telephone Encounter (Signed)
I refilled it but let pt know she can not take the ativan and pain med together -----ie in the same day

## 2017-05-08 NOTE — Telephone Encounter (Signed)
Patient requesting refill for lorazepam  Database ran and is on your desk for review.  Last filled per database: 04/04/17 Last written: 04/04/17 Last ov: 05/10/16 Next ov: 05/09/16 Contract: can get at appt 05/09/17 UDS: can get at appt 05/09/17  Patient is also getting oxycodone from Dr. Ninfa Linden last filled 04/08/17

## 2017-05-08 NOTE — Telephone Encounter (Signed)
Patient notified

## 2017-05-09 ENCOUNTER — Encounter: Payer: Self-pay | Admitting: Family Medicine

## 2017-05-09 ENCOUNTER — Ambulatory Visit (INDEPENDENT_AMBULATORY_CARE_PROVIDER_SITE_OTHER): Payer: Medicare Other | Admitting: Family Medicine

## 2017-05-09 ENCOUNTER — Encounter (INDEPENDENT_AMBULATORY_CARE_PROVIDER_SITE_OTHER): Payer: Self-pay | Admitting: Orthopaedic Surgery

## 2017-05-09 ENCOUNTER — Ambulatory Visit (INDEPENDENT_AMBULATORY_CARE_PROVIDER_SITE_OTHER): Payer: Medicare Other | Admitting: Orthopaedic Surgery

## 2017-05-09 VITALS — BP 130/52 | HR 60 | Temp 97.5°F | Resp 16 | Ht 63.0 in | Wt 253.4 lb

## 2017-05-09 DIAGNOSIS — F419 Anxiety disorder, unspecified: Secondary | ICD-10-CM

## 2017-05-09 DIAGNOSIS — Z23 Encounter for immunization: Secondary | ICD-10-CM | POA: Diagnosis not present

## 2017-05-09 DIAGNOSIS — Z79899 Other long term (current) drug therapy: Secondary | ICD-10-CM | POA: Diagnosis not present

## 2017-05-09 DIAGNOSIS — Z96641 Presence of right artificial hip joint: Secondary | ICD-10-CM

## 2017-05-09 DIAGNOSIS — D509 Iron deficiency anemia, unspecified: Secondary | ICD-10-CM | POA: Diagnosis not present

## 2017-05-09 DIAGNOSIS — I10 Essential (primary) hypertension: Secondary | ICD-10-CM

## 2017-05-09 LAB — LIPID PANEL
CHOLESTEROL: 232 mg/dL — AB (ref 0–200)
HDL: 39.3 mg/dL (ref >39.00)
LDL Cholesterol: 153 mg/dL — ABNORMAL HIGH (ref 0–99)
NONHDL: 192.26
Total CHOL/HDL Ratio: 6
Triglycerides: 197 mg/dL — ABNORMAL HIGH (ref 0.0–149.0)
VLDL: 39.4 mg/dL (ref 0.0–40.0)

## 2017-05-09 LAB — CBC WITH DIFFERENTIAL/PLATELET
BASOS PCT: 0.4 % (ref 0.0–3.0)
Basophils Absolute: 0 10*3/uL (ref 0.0–0.1)
EOS PCT: 0.6 % (ref 0.0–5.0)
Eosinophils Absolute: 0 10*3/uL (ref 0.0–0.7)
HCT: 39.4 % (ref 36.0–46.0)
Hemoglobin: 13.7 g/dL (ref 12.0–15.0)
LYMPHS ABS: 1.7 10*3/uL (ref 0.7–4.0)
Lymphocytes Relative: 21.9 % (ref 12.0–46.0)
MCHC: 34.8 g/dL (ref 30.0–36.0)
MCV: 102.2 fl — AB (ref 78.0–100.0)
MONOS PCT: 13.8 % — AB (ref 3.0–12.0)
Monocytes Absolute: 1.1 10*3/uL — ABNORMAL HIGH (ref 0.1–1.0)
NEUTROS PCT: 63.3 % (ref 43.0–77.0)
Neutro Abs: 4.8 10*3/uL (ref 1.4–7.7)
Platelets: 155 10*3/uL (ref 150.0–400.0)
RBC: 3.86 Mil/uL — ABNORMAL LOW (ref 3.87–5.11)
RDW: 12.9 % (ref 11.5–15.5)
WBC: 7.7 10*3/uL (ref 4.0–10.5)

## 2017-05-09 LAB — COMPREHENSIVE METABOLIC PANEL
ALBUMIN: 4.1 g/dL (ref 3.5–5.2)
ALT: 49 U/L — ABNORMAL HIGH (ref 0–35)
AST: 59 U/L — ABNORMAL HIGH (ref 0–37)
Alkaline Phosphatase: 91 U/L (ref 39–117)
BUN: 18 mg/dL (ref 6–23)
CO2: 32 mEq/L (ref 19–32)
Calcium: 10 mg/dL (ref 8.4–10.5)
Chloride: 100 mEq/L (ref 96–112)
Creatinine, Ser: 0.78 mg/dL (ref 0.40–1.20)
GFR: 76.91 mL/min (ref >60.00)
Glucose, Bld: 105 mg/dL — ABNORMAL HIGH (ref 70–99)
POTASSIUM: 3.9 meq/L (ref 3.5–5.1)
Sodium: 139 mEq/L (ref 135–145)
TOTAL PROTEIN: 7.7 g/dL (ref 6.0–8.3)
Total Bilirubin: 1.2 mg/dL (ref 0.2–1.2)

## 2017-05-09 LAB — IBC PANEL
Iron: 113 ug/dL (ref 42–145)
Saturation Ratios: 34.6 % (ref 20.0–50.0)
Transferrin: 233 mg/dL (ref 212.0–360.0)

## 2017-05-09 LAB — FERRITIN: Ferritin: 335.6 ng/mL — ABNORMAL HIGH (ref 10.0–291.0)

## 2017-05-09 MED ORDER — LORAZEPAM 1 MG PO TABS: ORAL_TABLET | ORAL | 1 refills | Status: DC

## 2017-05-09 MED ORDER — PNEUMOCOCCAL 13-VAL CONJ VACC IM SUSP: 0.5000 mL | INTRAMUSCULAR | Status: DC

## 2017-05-09 NOTE — Progress Notes (Signed)
The patient is now 21 days status post a right total hip arthroplasty through direct anterior approach.  She is ambling with a cane.  She is active 72 year old who is moderately obese.  She says she is doing well overall she states and feeling better than she did preop.  On exam I can easily put her right upper hip the range of motion with no difficulty at all.  Her leg lengths are equal.  At this point should continue increase her activities.  We talked about things that need to bring her back if she is having issues at all.  I will see her back in 6 months with a low AP pelvis and lateral of her right operative hip.  All questions concerns were answered and addressed.

## 2017-05-09 NOTE — Progress Notes (Signed)
Patient ID: Breanna Hernandez, female   DOB: [birth year], 72 y.o.   MRN: [id number]     Subjective:  I acted as a Education administrator for Dr. Carollee Herter.  Breanna Hernandez, Erma   Patient ID: Breanna Hernandez, female    DOB: [birth year], 72 y.o.   MRN: [id number]  Chief Complaint  Patient presents with  . Insomnia    LORAZEPAM  . RLS    HPI   Patient is in today for follow up insomnia and restless leg syndrome to ger lorazepam filled.  Patient Care Team: Carollee Herter, Alferd Apa, DO as PCP - General (Family Medicine) Heath Lark, MD as Consulting Physician (Hematology and Oncology) Sanda Klein, MD as Consulting Physician (Cardiology) Melrose Nakayama, MD as Consulting Physician (Cardiothoracic Surgery) Christophe Louis, MD as Consulting Physician (Obstetrics and Gynecology) Monna Fam, MD as Consulting Physician (Ophthalmology) Philemon Kingdom, MD as Consulting Physician (Internal Medicine)   Past Medical History:  Diagnosis Date  . Arthritis   . Atrial fibrillation ([place])    h/o post-op 04/2010  . Breast cancer ([place])    x 3 cancer - radiation right x2 ''00,'05, left '07 mammosite radiation   . Diverticulosis   . Dysrhythmia    a fib  . Fatty liver 01/10/07  . GERD (gastroesophageal reflux disease)    hx of  . Headache    migraines   . 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 HTN (hypertension)   . Hyperlipidemia   . Hyperplastic colon polyp   . Hypothyroidism   . IBS (irritable bowel syndrome)   . Lower extremity neuropathy    occ. affects walking and balance"foot to knees" nondiabetic  . Obesity   . Renal cyst, left   . Thyroid cancer ([employer]) 11/2015   surgery Oct 2017    Past Surgical History:  Procedure Laterality Date  . BREAST LUMPECTOMY  2000   Right x2, Left x1  . CHOLECYSTECTOMY    . COLONOSCOPY W/ POLYPECTOMY    . CORONARY ARTERY BYPASS GRAFT  05/03/2010   CABG X1, RCA -Dr Roxan Hockey (with type 1 aortic dissection)  . HYSTEROSCOPY W/D&C N/A 04/13/2014   Procedure: DILATATION AND CURETTAGE /HYSTEROSCOPY Polypectomy;  Surgeon: Maeola Sarah. Landry Mellow, MD;  Location: [facility];  Service: Gynecology;  Laterality: N/A;  Possible Polypectomy  . JOINT REPLACEMENT     Right total hip 03-15-17 Dr. Ninfa Linden  . Median sternotomy, extracorporeal circulation, repair of type 1 aortic dissection with  tube graft from  sinotubular junction to take off of innominate artery, distal anastomoses under deep bypothermic circulatory arrest  05/03/2010   Dr Roxan Hockey  . ROBOTIC ASSISTED TOTAL HYSTERECTOMY WITH BILATERAL SALPINGO OOPHERECTOMY Bilateral 05/18/2014   Procedure: ROBOTIC ASSISTED TOTAL HYSTERECTOMY WITH BILATERAL SALPINGO OOPHORECTOMY SENTINAL LYMPH NODE MAPPING ;  Surgeon: Everitt Amber, MD;  Location: [facility];  Service: Gynecology;  Laterality: Bilateral;  . THYROIDECTOMY N/A 01/09/2016   Procedure: TOTAL THYROIDECTOMY;  Surgeon: Armandina Gemma, MD;  Location: [facility];  Service: General;  Laterality: N/A;  . TONSILLECTOMY    . TOTAL HIP ARTHROPLASTY Right 03/15/2017   Procedure: RIGHT TOTAL HIP ARTHROPLASTY ANTERIOR APPROACH;  Surgeon: Mcarthur Rossetti, MD;  Location: [facility];  Service: Orthopedics;  Laterality: Right;  . TOTAL THYROIDECTOMY  01/09/2016    Family History  Problem Relation Age of Onset  . Aneurysm Father        CNS  . Heart attack Father        x2  . Alzheimer's disease Mother   .  Hypertension Mother   . Goiter Mother   . Hypertension Brother   . Kidney disease Brother   . Colon cancer Neg Hx     Social History   Socioeconomic History  . Marital status: Married    Spouse name: Not on file  . Number of children: Not on file  . Years of education: Not on file  . Highest education level: Not on file  Social Needs  . Financial resource strain: Not on file  . Food insecurity - worry: Not on file  . Food insecurity - inability: Not on file  . Transportation needs - medical: Not on file  . Transportation needs - non-medical: Not on file    Occupational History  . Not on file  Tobacco Use  . Smoking status: Never Smoker  . Smokeless tobacco: Never Used  Substance and Sexual Activity  . Alcohol use: No  . Drug use: No  . Sexual activity: Yes  Other Topics Concern  . Not on file  Social History Narrative  . Not on file    Outpatient Medications Prior to Visit  Medication Sig Dispense Refill  . aspirin EC 81 MG tablet Take 81 mg by mouth daily.    . benazepril (LOTENSIN) 40 MG tablet Take 1 tablet (40 mg total) by mouth daily. 90 tablet 3  . CALCIUM-MAGNESIUM-VITAMIN D PO Take 1 tablet by mouth 2 (two) times daily.     Marland Kitchen desonide (DESOWEN) 0.05 % cream Apply 1 application topically 3 (three) times a week.    . hydrochlorothiazide (MICROZIDE) 12.5 MG capsule Take 2 capsules (25 mg total) by mouth daily. 180 capsule 3  . levothyroxine (SYNTHROID, LEVOTHROID) 175 MCG tablet TAKE 1 TABLET (175 MCG TOTAL) BY MOUTH DAILY BEFORE BREAKFAST. 30 tablet 6  . methocarbamol (ROBAXIN) 500 MG tablet TAKE 1 TABLET BY MOUTH EVERY 6 HOURS AS NEEDED FOR MUSCLE SPASMS 60 tablet 0  . metoprolol tartrate (LOPRESSOR) 50 MG tablet TAKE 1 TABLET BY MOUTH TWICE A DAY. NEEDS APPOINTMENT FOR FUTURE REFILLS (Patient taking differently: TAKE 50 MG BY MOUTH TWICE A DAY.) 60 tablet 11  . Omega-3 Fatty Acids (FISH OIL PO) Take 1 capsule by mouth 2 (two) times daily.    Marland Kitchen OVER THE COUNTER MEDICATION Take 1 tablet by mouth every morning. Product Name: EHT Mind enhancement formula Vitamin d 200 units Vitamin b 1.6 mg Vitamin b-12 10 mcg Selinum 70 mcg Coffee Extract 35 mg Alpha leporic acid 50 mg huperzine-a 50 mcg    . oxyCODONE-acetaminophen (ROXICET) 5-325 MG tablet Take 1-2 tablets by mouth every 6 (six) hours as needed. 60 tablet 0  . Probiotic Product (PROBIOTIC PO) Take 1 capsule by mouth 2 (two) times daily.    Marland Kitchen aspirin EC 325 MG EC tablet Take 1 tablet (325 mg total) by mouth daily with breakfast. 30 tablet 0  . gabapentin (NEURONTIN) 100 MG  capsule Take 1 capsule (100 mg total) by mouth 3 (three) times daily. 60 capsule 1  . LORazepam (ATIVAN) 1 MG tablet 1 po qd prn 30 tablet 1   No facility-administered medications prior to visit.     No Known Allergies  Review of Systems  Constitutional: Negative for fever and malaise/fatigue.  HENT: Negative for congestion.   Eyes: Negative for blurred vision.  Respiratory: Negative for cough and shortness of breath.   Cardiovascular: Negative for chest pain, palpitations and leg swelling.  Gastrointestinal: Negative for vomiting.  Musculoskeletal: Negative for back pain.  Skin: Negative for  rash.  Neurological: Negative for loss of consciousness and headaches.  Psychiatric/Behavioral: The patient has insomnia.        Objective:    Physical Exam  Constitutional: She is oriented to person, place, and time. She appears well-developed and well-nourished.  HENT:  Head: Normocephalic and atraumatic.  Eyes: Conjunctivae and EOM are normal.  Neck: Normal range of motion. Neck supple. No JVD present. Carotid bruit is not present. No thyromegaly present.  Cardiovascular: Normal rate and regular rhythm.  Murmur heard. Pulmonary/Chest: Effort normal and breath sounds normal. No respiratory distress. She has no wheezes. She has no rales. She exhibits no tenderness.  Musculoskeletal: She exhibits no edema.  Neurological: She is alert and oriented to person, place, and time.  Psychiatric: She has a normal mood and affect. Her behavior is normal. Judgment and thought content normal.  Nursing note and vitals reviewed.   BP (!) 130/52 (BP Location: Left Arm, Cuff Size: Large)   Pulse 60   Temp (!) 97.5 F (36.4 C) (Oral)   Resp 16   Ht _0  (1.6 m)   Wt 253 lb 6.4 oz (114.9 kg)   SpO2 93%   BMI 44.89 kg/m  Wt Readings from Last 3 Encounters:  05/09/17 253 lb 6.4 oz (114.9 kg)  03/15/17 253 lb (114.8 kg)  03/14/17 253 lb (114.8 kg)   BP Readings from Last 3 Encounters:    05/09/17 (!) 130/52  03/19/17 (!) 104/55  03/14/17 138/60     Immunization History  Administered Date(s) Administered  . Influenza Split 04/17/2011  . Pneumococcal Conjugate-13 05/09/2017    Health Maintenance  Topic Date Due  . TETANUS/TDAP  05/10/2017 (Originally 03/14/1965)  . INFLUENZA VACCINE  01/10/2024 (Originally 11/07/2016)  . PNA vac Low Risk Adult (2 of 2 - PPSV23) 05/09/2018  . MAMMOGRAM  11/29/2018  . COLONOSCOPY  02/02/2020  . DEXA SCAN  Completed  . Hepatitis C Screening  Completed    Lab Results  Component Value Date   WBC 7.7 05/09/2017   HGB 13.7 05/09/2017   HCT 39.4 05/09/2017   PLT 155.0 05/09/2017   GLUCOSE 105 (H) 05/09/2017   CHOL 232 (H) 05/09/2017   TRIG 197.0 (H) 05/09/2017   HDL 39.30 05/09/2017   LDLDIRECT 148.3 03/29/2006   LDLCALC 153 (H) 05/09/2017   ALT 49 (H) 05/09/2017   AST 59 (H) 05/09/2017   NA 139 05/09/2017   K 3.9 05/09/2017   CL 100 05/09/2017   CREATININE 0.78 05/09/2017   BUN 18 05/09/2017   CO2 32 05/09/2017   TSH 3.54 11/13/2016   INR 2 09/01/2014   HGBA1C 5.8 08/17/2014    Lab Results  Component Value Date   TSH 3.54 11/13/2016   Lab Results  Component Value Date   WBC 7.7 05/09/2017   HGB 13.7 05/09/2017   HCT 39.4 05/09/2017   MCV 102.2 (H) 05/09/2017   PLT 155.0 05/09/2017   Lab Results  Component Value Date   NA 139 05/09/2017   K 3.9 05/09/2017   CHLORIDE 102 11/03/2015   CO2 32 05/09/2017   GLUCOSE 105 (H) 05/09/2017   BUN 18 05/09/2017   CREATININE 0.78 05/09/2017   BILITOT 1.2 05/09/2017   ALKPHOS 91 05/09/2017   AST 59 (H) 05/09/2017   ALT 49 (H) 05/09/2017   PROT 7.7 05/09/2017   ALBUMIN 4.1 05/09/2017   CALCIUM 10.0 05/09/2017   ANIONGAP 9 03/16/2017   EGFR 61 (L) 11/03/2015   GFR 76.91 05/09/2017  Lab Results  Component Value Date   CHOL 232 (H) 05/09/2017   Lab Results  Component Value Date   HDL 39.30 05/09/2017   Lab Results  Component Value Date   LDLCALC 153 (H)  05/09/2017   Lab Results  Component Value Date   TRIG 197.0 (H) 05/09/2017   Lab Results  Component Value Date   CHOLHDL 6 05/09/2017   Lab Results  Component Value Date   HGBA1C 5.8 08/17/2014         Assessment & Plan:   Problem List Items Addressed This Visit      Unprioritized   Essential hypertension   Relevant Medications   aspirin EC 81 MG tablet   Other Relevant Orders   Lipid panel (Completed)   Comprehensive metabolic panel (Completed)    Other Visit Diagnoses    Iron deficiency anemia, unspecified iron deficiency anemia type    -  Primary   Relevant Orders   CBC with Differential/Platelet (Completed)   IBC panel (Completed)   Ferritin (Completed)   Anxiety       Relevant Medications   LORazepam (ATIVAN) 1 MG tablet   Other Relevant Orders   Pain Mgmt, Profile 8 w/Conf, U   High risk medication use       Relevant Orders   Pain Mgmt, Profile 8 w/Conf, U   Need for pneumococcal vaccination       Relevant Orders   Pneumococcal conjugate vaccine 13-valent IM (Completed)      I have discontinued Kaysie Dimare's aspirin and gabapentin. I am also having her maintain her CALCIUM-MAGNESIUM-VITAMIN D PO, OVER THE COUNTER MEDICATION, Probiotic Product (PROBIOTIC PO), hydrochlorothiazide, levothyroxine, Omega-3 Fatty Acids (FISH OIL PO), metoprolol tartrate, benazepril, desonide, methocarbamol, oxyCODONE-acetaminophen, aspirin EC, and LORazepam.  Meds ordered this encounter  Medications  . LORazepam (ATIVAN) 1 MG tablet    Sig: 1 po qd prn    Dispense:  30 tablet    Refill:  1  . DISCONTD: pneumococcal 13-valent conjugate vaccine (PREVNAR 13) injection 0.5 mL    CMA served as scribe during this visit. History, Physical and Plan performed by medical provider. Documentation and orders reviewed and attested to.  Ann Held, DO

## 2017-05-09 NOTE — Patient Instructions (Signed)

## 2017-05-13 LAB — PAIN MGMT, PROFILE 8 W/CONF, U
6 ACETYLMORPHINE: NEGATIVE ng/mL (ref <10)
ALCOHOL METABOLITES: NEGATIVE ng/mL (ref <500)
ALPHAHYDROXYTRIAZOLAM: NEGATIVE ng/mL (ref <50)
AMPHETAMINES: NEGATIVE ng/mL (ref <500)
Alphahydroxyalprazolam: NEGATIVE ng/mL (ref <25)
Alphahydroxymidazolam: NEGATIVE ng/mL (ref <50)
Aminoclonazepam: NEGATIVE ng/mL (ref <25)
Benzodiazepines: POSITIVE ng/mL — AB (ref <100)
Buprenorphine, Urine: NEGATIVE ng/mL (ref <5)
COCAINE METABOLITE: NEGATIVE ng/mL (ref <150)
Creatinine: 148.4 mg/dL
Hydroxyethylflurazepam: NEGATIVE ng/mL (ref <50)
LORAZEPAM: 918 ng/mL — AB (ref <50)
MDMA: NEGATIVE ng/mL (ref <500)
Marijuana Metabolite: NEGATIVE ng/mL (ref <20)
NORDIAZEPAM: NEGATIVE ng/mL (ref <50)
OXAZEPAM: NEGATIVE ng/mL (ref <50)
OXIDANT: NEGATIVE ug/mL (ref <200)
Opiates: NEGATIVE ng/mL (ref <100)
Oxycodone: NEGATIVE ng/mL (ref <100)
Temazepam: NEGATIVE ng/mL (ref <50)
pH: 5.62 (ref 4.5–9.0)

## 2017-05-15 ENCOUNTER — Telehealth: Payer: Self-pay | Admitting: *Deleted

## 2017-05-15 DIAGNOSIS — E785 Hyperlipidemia, unspecified: Secondary | ICD-10-CM

## 2017-05-15 DIAGNOSIS — I10 Essential (primary) hypertension: Secondary | ICD-10-CM

## 2017-05-15 NOTE — Telephone Encounter (Signed)
-----   Message from Ann Held, DO sent at 05/13/2017 10:28 PM EST ----- Neg opioids  If taking regularly it would be + Cholesterol--- LDL goal < 100,  HDL >40,  TG < 150.  Diet and exercise will increase HDL and decrease LDL and TG.  Fish,  Fish Oil, Flaxseed oil will also help increase the HDL and decrease Triglycerides.   Recheck labs in 3 months Liver function elevated---- any etoh, extra tylenol of other otc meds?   Lipid, cmp,uds.

## 2017-05-15 NOTE — Telephone Encounter (Signed)
q40mo

## 2017-05-15 NOTE — Telephone Encounter (Signed)
Patient notified of lab results.  She is currently taking oxycodone for her hip.  She wanted to know about ferritin level.  She stated that you were checking iron levels because of RLS.    The UDS we got because of lorazepam and she takes it once a day.  Would that change when we do her UDS next?  It looks like Dr. Ninfa Linden manages her oxycodone.

## 2017-05-16 ENCOUNTER — Ambulatory Visit: Payer: Medicare Other | Admitting: Internal Medicine

## 2017-05-16 NOTE — Telephone Encounter (Signed)
Iron levels were basically normal and ferritin was just slightly abn  Probably not causing the rls

## 2017-05-16 NOTE — Telephone Encounter (Signed)
Did you want to comment on her ferritin level?  She said you were checking because of RLS.

## 2017-05-17 ENCOUNTER — Encounter: Payer: Self-pay | Admitting: Internal Medicine

## 2017-05-17 ENCOUNTER — Ambulatory Visit (INDEPENDENT_AMBULATORY_CARE_PROVIDER_SITE_OTHER): Payer: Medicare Other | Admitting: Internal Medicine

## 2017-05-17 ENCOUNTER — Other Ambulatory Visit: Payer: Self-pay

## 2017-05-17 VITALS — BP 130/82 | HR 64 | Ht 64.0 in | Wt 253.0 lb

## 2017-05-17 DIAGNOSIS — E890 Postprocedural hypothyroidism: Secondary | ICD-10-CM

## 2017-05-17 DIAGNOSIS — C73 Malignant neoplasm of thyroid gland: Secondary | ICD-10-CM

## 2017-05-17 LAB — TSH: TSH: 2.72 u[IU]/mL (ref 0.35–4.50)

## 2017-05-17 LAB — T4, FREE: Free T4: 1.12 ng/dL (ref 0.60–1.60)

## 2017-05-17 MED ORDER — LEVOTHYROXINE SODIUM 175 MCG PO TABS: 175.0000 ug | ORAL_TABLET | Freq: Every day | ORAL | 3 refills | Status: DC

## 2017-05-17 MED ORDER — LEVOTHYROXINE SODIUM 175 MCG PO TABS: 175.0000 ug | ORAL_TABLET | Freq: Every day | ORAL | 6 refills | Status: DC

## 2017-05-17 NOTE — Telephone Encounter (Signed)
LMOVM stating this/thx dmf

## 2017-05-17 NOTE — Patient Instructions (Signed)
Please stop at the lab.  We will schedule a new neck U/S for now.  Please continue Levothyroxine 175 mcg daily.  Take the thyroid hormone every day, with water, at least 30 minutes before breakfast, separated by at least 4 hours from: - acid reflux medications - calcium - iron - multivitamins  Please come back for a follow-up appointment in 1 year.

## 2017-05-17 NOTE — Progress Notes (Signed)
Patient ID: Breanna Hernandez, female   DOB: 12/02/1945, 72 y.o.   MRN: [id number]    HPI  Breanna Hernandez is a 72 y.o.-year-old female, initially referred by Dr. Harlow Asa, for management of thyroid cancer and postsurgical hypothyroidism. Last visit 6 months ago.  She had R THR 03/2017.  She is feeling better after the surgery.  She still uses a 3 pronged walker outside the house.  Reviewed and addended history: Pt has a h/o Ao dissection in 2012 >> has CT scans q 2 years >> thyroid nodule incidentally found on CT scan  in 2017.  Patient had a thyroid ultrasound on 10/21/2015 that showed a right 1.5 x 1.4 x 1.4 cm nodule, solid, with punctate calcifications.  Pt. Had an FNA of this nodule in 11/2015: Papillary thyroid cancer.  Patient had total thyroidectomy by Dr. Harlow Asa on 01/09/2016. 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 Diagnosis 1. Thyroid, lobectomy, Right - PAPILLARY THYROID CARCINOMA, 1.5 CM. - LYMPHOVASCULAR INVASION IS IDENTIFIED. - CARCINOMA IS CONFINED TO THE THYROID. - THE SURGICAL RESECTION MARGINS ARE NEGATIVE FOR CARCINOMA. - TWO BENIGN LYMPH NODES (0/2). - SEE ONCOLOGY TABLE BELOW. 2. Thyroid, lobectomy, Left - LYMPHOCYTIC THYROIDITIS. - THERE IS NO EVIDENCE OF MALIGNANCY. Microscopic Comment 1. THYROID Specimen: Bilateral thyroid lobes Procedure (including lymph node sampling if applicable): Bilateral hemi-thyroidectomy Specimen Integrity (intact/fragmented): Intact Tumor focality: Unifocal Dominant tumor: Maximum tumor size (cm): 1.5 cm (gross measurement). 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 Capsular invasion with degree of invasion if present: N/A Margins: Negative for carcinoma Lymphatic or vascular invasion: Present,  focal Lymph nodes: # examined 2; # positive; 0 Extracapsular extension (if applicable): Not identified. TNM code: pT1b , pN0 Non-neoplastic thyroid: Lymphocytic thyroiditis.  Enid Cutter MD Pathologist, Electronic Signature (Case signed 01/10/2016)  Patient had RAI treatment with 46.7 mCi I-131 on 03/19/2016.  On 03/26/2016, the posttreatment whole-body scan was negative for metastases.  Reviewed previous thyroglobulin and ATA levels: Lab Results  Component Value Date   THYROGLB <0.1 (L) 11/13/2016   THYROGLB <0.1 (L) 05/15/2016   THGAB <1.0 11/13/2016   THGAB 2 (H) 05/15/2016   Postsurgical hypothyroidism: Pt is on levothyroxine 175 mcg daily, taken: - in am - fasting - at least 30 min from b'fast - no Fe, MVI, PPIs - + Ca at noon - not on Biotin now >> stopped  She has a h/o hypothyroidism for >30 years >> was on a stable dose of 75 mcg LT4 before the Sx.  I reviewed pt's thyroid tests: Lab Results  Component Value Date   TSH 3.54 11/13/2016   TSH 14.89 (H) 07/04/2016   TSH 11.48 (H) 05/15/2016   TSH 21.20 (H) 03/22/2016   TSH 37.51 (H) 02/16/2016   TSH 3.10 09/29/2015   TSH 3.11 08/17/2014   TSH 1.72 09/22/2013   TSH 3.70 09/20/2010   TSH 8.161 (H) 05/16/2010   FREET4 1.26 11/13/2016   FREET4 1.14 07/04/2016   FREET4 0.83 05/15/2016   FREET4 0.87 03/22/2016   FREET4 0.47 (L) 02/16/2016   FREET4 0.99 09/20/2010    Pt denies: - feeling nodules in neck - hoarseness - dysphagia - choking - SOB with lying down  She has + FH of thyroid disorders in: mother (thyroid nodules). No FH of thyroid cancer. No h/o radiation tx to head or neck.  No seaweed  or kelp. No recent contrast studies. No herbal supplements. No Biotin use. No recent steroids use.   ROS: Constitutional: no weight gain/no weight loss, no fatigue, no subjective hyperthermia, no subjective hypothermia Eyes: no blurry vision, no xerophthalmia ENT: no sore throat, + see HPI Cardiovascular: no  CP/no SOB/no palpitations/no leg swelling Respiratory: no cough/no SOB/no wheezing Gastrointestinal: no N/no V/no D/no C/no acid reflux Musculoskeletal: + muscle aches/+ joint aches Skin: no rashes, + hair loss Neurological: no tremors/no numbness/no tingling/no dizziness  I reviewed pt's medications, allergies, PMH, social hx, family hx, and changes were documented in the history of present illness. Otherwise, unchanged from my initial visit note.  Past Medical History:  Diagnosis Date  . Arthritis   . Atrial fibrillation ([place])    h/o post-op 04/2010  . Breast cancer ([place])    x 3 cancer - radiation right x2 ''00,'05, left '07 mammosite radiation   . Diverticulosis   . Dysrhythmia    a fib  . Fatty liver 01/10/07  . GERD (gastroesophageal reflux disease)    hx of  . Headache    migraines   . 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 HTN (hypertension)   . Hyperlipidemia   . Hyperplastic colon polyp   . Hypothyroidism   . IBS (irritable bowel syndrome)   . Lower extremity neuropathy    occ. affects walking and balance"foot to knees" nondiabetic  . Obesity   . Renal cyst, left   . Thyroid cancer ([place]) 11/2015   surgery Oct 2017   Past Surgical History:  Procedure Laterality Date  . BREAST LUMPECTOMY  2000   Right x2, Left x1  . CHOLECYSTECTOMY    . COLONOSCOPY W/ POLYPECTOMY    . CORONARY ARTERY BYPASS GRAFT  05/03/2010   CABG X1, RCA -Dr Roxan Hockey (with type 1 aortic dissection)  . HYSTEROSCOPY W/D&C N/A 04/13/2014   Procedure: DILATATION AND CURETTAGE /HYSTEROSCOPY Polypectomy;  Surgeon: Maeola Sarah. Landry Mellow, MD;  Location: [facility];  Service: Gynecology;  Laterality: N/A;  Possible Polypectomy  . JOINT REPLACEMENT     Right total hip 03-15-17 Dr. Ninfa Linden  . Median sternotomy, extracorporeal circulation, repair of type 1 aortic dissection with  tube graft from  sinotubular junction to take off of innominate artery, distal anastomoses under deep bypothermic circulatory  arrest  05/03/2010   Dr Roxan Hockey  . ROBOTIC ASSISTED TOTAL HYSTERECTOMY WITH BILATERAL SALPINGO OOPHERECTOMY Bilateral 05/18/2014   Procedure: ROBOTIC ASSISTED TOTAL HYSTERECTOMY WITH BILATERAL SALPINGO OOPHORECTOMY SENTINAL LYMPH NODE MAPPING ;  Surgeon: Everitt Amber, MD;  Location: [facility];  Service: Gynecology;  Laterality: Bilateral;  . THYROIDECTOMY N/A 01/09/2016   Procedure: TOTAL THYROIDECTOMY;  Surgeon: Armandina Gemma, MD;  Location: [facility];  Service: General;  Laterality: N/A;  . TONSILLECTOMY    . TOTAL HIP ARTHROPLASTY Right 03/15/2017   Procedure: RIGHT TOTAL HIP ARTHROPLASTY ANTERIOR APPROACH;  Surgeon: Mcarthur Rossetti, MD;  Location: [facility];  Service: Orthopedics;  Laterality: Right;  . TOTAL THYROIDECTOMY  01/09/2016   Social History   Social History  . Marital status: Married    Spouse name: N/A  . Number of children: 6   Occupational History  . retired   Social History Main Topics  . Smoking status: Never Smoker  . Smokeless tobacco: Never Used  . Alcohol use No  . Drug use: No   Current Outpatient Medications on File Prior to Visit  Medication Sig Dispense Refill  . aspirin EC 81 MG tablet Take  81 mg by mouth daily.    . benazepril (LOTENSIN) 40 MG tablet Take 1 tablet (40 mg total) by mouth daily. 90 tablet 3  . CALCIUM-MAGNESIUM-VITAMIN D PO Take 1 tablet by mouth 2 (two) times daily.     Marland Kitchen desonide (DESOWEN) 0.05 % cream Apply 1 application topically 3 (three) times a week.    . hydrochlorothiazide (MICROZIDE) 12.5 MG capsule Take 2 capsules (25 mg total) by mouth daily. 180 capsule 3  . LORazepam (ATIVAN) 1 MG tablet 1 po qd prn 30 tablet 1  . methocarbamol (ROBAXIN) 500 MG tablet TAKE 1 TABLET BY MOUTH EVERY 6 HOURS AS NEEDED FOR MUSCLE SPASMS 60 tablet 0  . metoprolol tartrate (LOPRESSOR) 50 MG tablet TAKE 1 TABLET BY MOUTH TWICE A DAY. NEEDS APPOINTMENT FOR FUTURE REFILLS (Patient taking differently: TAKE 50 MG BY MOUTH TWICE A DAY.) 60 tablet 11  .  Omega-3 Fatty Acids (FISH OIL PO) Take 1 capsule by mouth 2 (two) times daily.    Marland Kitchen OVER THE COUNTER MEDICATION Take 1 tablet by mouth every morning. Product Name: EHT Mind enhancement formula Vitamin d 200 units Vitamin b 1.6 mg Vitamin b-12 10 mcg Selinum 70 mcg Coffee Extract 35 mg Alpha leporic acid 50 mg huperzine-a 50 mcg    . oxyCODONE-acetaminophen (ROXICET) 5-325 MG tablet Take 1-2 tablets by mouth every 6 (six) hours as needed. 60 tablet 0  . Probiotic Product (PROBIOTIC PO) Take 1 capsule by mouth 2 (two) times daily.    . [DISCONTINUED] amLODipine-benazepril (LOTREL) 5-20 MG per capsule Take 1 capsule by mouth daily.      . [DISCONTINUED] escitalopram (LEXAPRO) 10 MG tablet Take 10 mg by mouth daily.      . [DISCONTINUED] lisinopril (PRINIVIL,ZESTRIL) 20 MG tablet Take 20 mg by mouth daily.       No current facility-administered medications on file prior to visit.    No Known Allergies Family History  Problem Relation Age of Onset  . Aneurysm Father        CNS  . Heart attack Father        x2  . Alzheimer's disease Mother   . Hypertension Mother   . Goiter Mother   . Hypertension Brother   . Kidney disease Brother   . Colon cancer Neg Hx    PE: BP 130/82   Pulse 64   Ht 5\' 4"  (1.626 m)   Wt 253 lb (114.8 kg)   SpO2 94%   BMI 43.43 kg/m  Wt Readings from Last 3 Encounters:  05/17/17 253 lb (114.8 kg)  05/09/17 253 lb 6.4 oz (114.9 kg)  03/15/17 253 lb (114.8 kg)   Constitutional: overweight, in NAD Eyes: PERRLA, EOMI, no exophthalmos ENT: moist mucous membranes, no neck masses, thyroidectomy scar healed, no cervical lymphadenopathy Cardiovascular: RRR,No RG, + 1/6 SEM, + B LE swelling - pitting Respiratory: CTA B Gastrointestinal: abdomen soft, NT, ND, BS+ Musculoskeletal: no deformities, strength intact in all 4 Skin: moist, warm, no rashes Neurological: no tremor with outstretched hands, DTR normal in all 4  ASSESSMENT: 1. Thyroid cancer - see  HPI  2. Postsurgical Hypothyroidism  PLAN:  1. Thyroid cancer - papillary - Patient has stage I TNM thyroid cancer.  The tumor was 1.5 cm, however, it had lymphovascular invasion and it also contained Hurthle cell features.  Therefore, I recommended RAI treatment for thyroid remnant ablation.  She had RAI treatment with Thyrogen on 03/19/2016 (46.7 mCi) a posttreatment WBS was negative for metastasis. -  We again discussed that her cancer is low risk, but we still need to monitor her tumor marker, thyroglobulin - Thyroglobulin was undetectable in the past, but she had positive ATA antibodies.  At last visit, we checked thyroglobulin with a [facility] assay and now both thyroglobulin and the ATA antibodies were undetectable.  We will repeat this today, again with the [facility] assay. - We will check another thyroid ultrasound now, 1 year after her RAI treatment - I will see the patient back in 1 year.  2. Patient with history of total thyroidectomy for thyroid cancer, now with iatrogenic hypothyroidism, uncontrolled, on levothyroxine therapy - latest thyroid labs reviewed with pt >> normal in 11/2016 - she continues on LT4 175 mcg daily - pt feels good on this dose. - we discussed about taking the thyroid hormone every day, with water, >30 minutes before breakfast, separated by >4 hours from acid reflux medications, calcium, iron, multivitamins. Pt. is taking it correctly. - will check thyroid tests today: TSH and fT4 - If labs are abnormal, she will need to return for repeat TFTs in 1.5 months  Needs 90 day refills.  Office Visit on 05/17/2017  Component Date Value Ref Range Status  . TSH 05/17/2017 2.72  0.35 - 4.50 uIU/mL Final  . Free T4 05/17/2017 1.12  0.60 - 1.60 ng/dL Final   Comment: Specimens from patients who are undergoing biotin therapy and /or ingesting biotin supplements may contain high levels of biotin.  The higher biotin concentration in these specimens interferes with this Free  T4 assay.  Specimens that contain high levels  of biotin may cause false high results for this Free T4 assay.  Please interpret results in light of the total clinical presentation of the patient.    . Thyroglobulin Antibody 05/17/2017 <1.0  0.0 - 0.9 IU/mL Final   Thyroglobulin Antibody measured by [facility] Methodology  . Thyroglobulin by IMA 05/17/2017 <0.1* 1.5 - 38.5 ng/mL Final   Comment: According to the [facility] of Clinical Biochemistry, the reference interval for Thyroglobulin (TG) should be related to euthyroid patients and not for patients who underwent thyroidectomy. TG reference intervals for these patients depend on the residual mass of the thyroid tissue left after surgery. Establishing a post-operative baseline is recommended. The assay limit of quantitation is 0.1 ng/mL Thyroglobulin measured by [employer] Immunometric Assay    TFTs normal. Tg undetectable.   Philemon Kingdom, MD PhD [facility] Endocrinology

## 2017-05-18 LAB — TGAB+THYROGLOBULIN IMA OR LCMS: Thyroglobulin Antibody: <1 IU/mL (ref 0.0–0.9)

## 2017-05-18 LAB — THYROGLOBULIN BY IMA: Thyroglobulin by IMA: <0.1 ng/mL — ABNORMAL LOW (ref 1.5–38.5)

## 2017-06-12 ENCOUNTER — Telehealth: Payer: Self-pay | Admitting: Family Medicine

## 2017-06-12 NOTE — Telephone Encounter (Signed)
Can we send in [place] for her or do you want to see her

## 2017-06-12 NOTE — Telephone Encounter (Signed)
Copied from [place]. Topic: Quick Communication - See Telephone Encounter >> Jun 12, 2017  9:39 AM Conception Chancy, NT wrote: CRM for notification. See Telephone encounter for:  06/12/17.  Patient is calling and states when she had surgery in December 2018 they swabbed her nose and diagnosed her with staff infection. Patient states she has sores in her nose and would like to know if something can be called in. Patient is also requesting a call back from Dr. Sherrine Maples nurse. Please advise.   [id number] - [place], [place] - [address]  [address] [place] [address]  Phone: [phone] Fax: [phone]

## 2017-06-13 NOTE — Telephone Encounter (Signed)
Pt is asking what the status of the Rx request, see telephone note below

## 2017-06-13 NOTE — Telephone Encounter (Signed)
bactroban nasal 1/2 tube in each nostril bid x 5 days

## 2017-06-14 ENCOUNTER — Other Ambulatory Visit: Payer: Self-pay | Admitting: *Deleted

## 2017-06-14 MED ORDER — MUPIROCIN 2 % EX OINT: 1.0000 "application " | TOPICAL_OINTMENT | Freq: Two times a day (BID) | CUTANEOUS | 0 refills | Status: DC

## 2017-06-14 MED ORDER — MUPIROCIN CALCIUM 2 % NA OINT: 1.0000 "application " | TOPICAL_OINTMENT | Freq: Two times a day (BID) | NASAL | 0 refills | Status: DC

## 2017-06-14 NOTE — Telephone Encounter (Signed)
Patient notified and rx faxed in

## 2017-07-09 ENCOUNTER — Other Ambulatory Visit: Payer: Self-pay | Admitting: Family Medicine

## 2017-07-09 DIAGNOSIS — I10 Essential (primary) hypertension: Secondary | ICD-10-CM

## 2017-07-10 ENCOUNTER — Telehealth: Payer: Self-pay | Admitting: Family Medicine

## 2017-07-10 NOTE — Telephone Encounter (Signed)
Copied from [place] [phone]. Topic: Quick Communication - See Telephone Encounter >> Jul 10, 2017  4:13 PM Aurelio Brash B wrote: CRM for notification. See Telephone encounter for: 07/10/17  Pt   was tested for mrsa   and test was positive for staph germs in Dec, she has had  sores in her nose, her  brother is terminally ill   and she wants to go  see him,  doesn't know if this is a problem is asking for advice, does she need an antibiotic?

## 2017-07-11 ENCOUNTER — Other Ambulatory Visit: Payer: Self-pay | Admitting: Family Medicine

## 2017-07-11 DIAGNOSIS — Z22322 Carrier or suspected carrier of Methicillin resistant Staphylococcus aureus: Secondary | ICD-10-CM

## 2017-07-11 MED ORDER — MUPIROCIN 2 % EX OINT: 1.0000 "application " | TOPICAL_OINTMENT | Freq: Two times a day (BID) | CUTANEOUS | 0 refills | Status: DC

## 2017-07-11 NOTE — Telephone Encounter (Signed)
Patient notified

## 2017-07-11 NOTE — Telephone Encounter (Signed)
Can use bactroban again and ID has recommended adding 1 tablespoon of bleach in bathtub full of warm water before seeing brother to get mrsa off skin=--- but only 1 tablespoon.

## 2017-07-11 NOTE — Telephone Encounter (Signed)
Will it be ok if she sees her brother?

## 2017-09-05 ENCOUNTER — Other Ambulatory Visit: Payer: Self-pay | Admitting: Thoracic Surgery (Cardiothoracic Vascular Surgery)

## 2017-09-05 DIAGNOSIS — I7101 Dissection of thoracic aorta: Secondary | ICD-10-CM

## 2017-09-05 DIAGNOSIS — I71019 Dissection of thoracic aorta, unspecified: Secondary | ICD-10-CM

## 2017-09-24 ENCOUNTER — Other Ambulatory Visit: Payer: Self-pay

## 2017-09-24 ENCOUNTER — Encounter: Payer: Self-pay | Admitting: Thoracic Surgery (Cardiothoracic Vascular Surgery)

## 2017-09-24 ENCOUNTER — Ambulatory Visit (INDEPENDENT_AMBULATORY_CARE_PROVIDER_SITE_OTHER): Payer: Medicare Other | Admitting: Thoracic Surgery (Cardiothoracic Vascular Surgery)

## 2017-09-24 ENCOUNTER — Ambulatory Visit
Admission: RE | Admit: 2017-09-24 | Discharge: 2017-09-24 | Disposition: A | Discharge status: Home / Self Care | Payer: Medicare Other | Source: Ambulatory Visit | Attending: Thoracic Surgery (Cardiothoracic Vascular Surgery) | Admitting: Thoracic Surgery (Cardiothoracic Vascular Surgery)

## 2017-09-24 VITALS — BP 115/65 | HR 59 | Resp 16 | Ht 64.0 in | Wt 252.0 lb

## 2017-09-24 DIAGNOSIS — I71019 Dissection of thoracic aorta, unspecified: Secondary | ICD-10-CM

## 2017-09-24 DIAGNOSIS — Z951 Presence of aortocoronary bypass graft: Secondary | ICD-10-CM | POA: Diagnosis not present

## 2017-09-24 DIAGNOSIS — I7101 Dissection of thoracic aorta: Secondary | ICD-10-CM

## 2017-09-24 DIAGNOSIS — Z09 Encounter for follow-up examination after completed treatment for conditions other than malignant neoplasm: Secondary | ICD-10-CM | POA: Diagnosis not present

## 2017-09-24 MED ORDER — IOPAMIDOL (ISOVUE-370) INJECTION 76%
75.0000 mL | Freq: Once | INTRAVENOUS | Status: AC | PRN
  Administered 2017-09-24: 75 mL via INTRAVENOUS

## 2017-09-24 NOTE — Progress Notes (Signed)
[address]       [place] [address]             [phone]     HPI: Breanna Hernandez returns for a scheduled 2 year follow up  Breanna Hernandez is a 72 year old woman with a history of hypertension, hyperlipidemia, type I aortic dissection, postoperative atrial fibrillation, breast cancer, thyroid cancer, obesity, reflux, and irritable bowel syndrome.  She underwent repair of a type I aortic dissection and single-vessel bypass into the right coronary in January 2012.  We have followed her since that time.  I last saw her in the office in June 2017.  I was seeing her on biannual basis because she was refused to come back on an annual basis.  She was concerned about the amount of radiation she would get with a CT scan.  She is unable to tolerate MRIs.  When I last saw her the aorta was stable, but a thyroid nodule had increased in size.  That turned out to be a thyroid cancer.  She had surgery for that.  She denies any chest pain, pressure, or tightness.  She had a stress test prior to hip surgery in November.  It was a low risk study.  Past Medical History:  Diagnosis Date  . Arthritis   . Atrial fibrillation ([place])    h/o post-op 04/2010  . Breast cancer ([place])    x 3 cancer - radiation right x2 ''00,'05, left '07 mammosite radiation   . Diverticulosis   . Dysrhythmia    a fib  . Fatty liver 01/10/07  . GERD (gastroesophageal reflux disease)    hx of  . Headache    migraines   . 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 HTN (hypertension)   . Hyperlipidemia   . Hyperplastic colon polyp   . Hypothyroidism   . IBS (irritable bowel syndrome)   . Lower extremity neuropathy    occ. affects walking and balance"foot to knees" nondiabetic  . Obesity   . Renal cyst, left   . Thyroid cancer ([place]) 11/2015   surgery Oct 2017    Current Outpatient Medications  Medication Sig Dispense Refill  . aspirin EC 81 MG tablet Take 81 mg by mouth daily.    . benazepril  (LOTENSIN) 40 MG tablet Take 1 tablet (40 mg total) by mouth daily. 90 tablet 3  . CALCIUM-MAGNESIUM-VITAMIN D PO Take 1 tablet by mouth 2 (two) times daily.     Marland Kitchen desonide (DESOWEN) 0.05 % cream Apply 1 application topically 3 (three) times a week.    . hydrochlorothiazide (MICROZIDE) 12.5 MG capsule TAKE 2 CAPSULES BY MOUTH DAILY 180 capsule 3  . levothyroxine (SYNTHROID, LEVOTHROID) 175 MCG tablet Take 1 tablet (175 mcg total) by mouth daily before breakfast. 90 tablet 3  . LORazepam (ATIVAN) 1 MG tablet 1 po qd prn 30 tablet 1  . metoprolol tartrate (LOPRESSOR) 50 MG tablet TAKE 1 TABLET BY MOUTH TWICE A DAY. NEEDS APPOINTMENT FOR FUTURE REFILLS (Patient taking differently: TAKE 50 MG BY MOUTH TWICE A DAY.) 60 tablet 11  . mupirocin nasal ointment (BACTROBAN NASAL) 2 % Place 1 application into the nose 2 (two) times daily. Use one-half of tube in each nostril twice daily for five (5) days. After application, press sides of nose together and gently massage. 10 g 0  . mupirocin ointment (BACTROBAN) 2 % Place 1 application into the nose 2 (two) times daily.  22 g 0  . Omega-3 Fatty Acids (FISH OIL PO) Take 1 capsule by mouth 2 (two) times daily.    Marland Kitchen OVER THE COUNTER MEDICATION Take 1 tablet by mouth every morning. Product Name: EHT Mind enhancement formula Vitamin d 200 units Vitamin b 1.6 mg Vitamin b-12 10 mcg Selinum 70 mcg Coffee Extract 35 mg Alpha leporic acid 50 mg huperzine-a 50 mcg    . oxyCODONE-acetaminophen (ROXICET) 5-325 MG tablet Take 1-2 tablets by mouth every 6 (six) hours as needed. 60 tablet 0  . Probiotic Product (PROBIOTIC PO) Take 1 capsule by mouth 2 (two) times daily.     No current facility-administered medications for this visit.     Physical Exam BP 115/65 (BP Location: Left Arm, Patient Position: Sitting, Cuff Size: Large)   Pulse (!) 59   Resp 16   Ht 5\' 4"  (1.626 m)   Wt 252 lb (114.3 kg)   SpO2 95% Comment: ON RA  BMI 43.26 kg/m  Obese 72 year old  woman in no acute distress Alert and oriented x3 with no focal deficits No carotid bruits Cardiac regular rate and rhythm with a 2/6 systolic murmur Lungs clear with equal breath sounds bilaterally  Diagnostic Tests: CT report pending  I personally reviewed the CT images and see no difference in the aorta from the scan in 2017.  Impression: Breanna Hernandez is a 72 year old woman who had repair of a type I aortic dissection back in 2012.  She is been followed since that time to evaluate for any aortopathy.  She has not had any other evidence of dissection or aneurysm since then.  Her aorta appears unchanged on her CT compared to 2 years ago.  Blood pressures well controlled.  Await radiology reading of remainder of scan.  She says that she now is comfortable having a CT scan annually.  Plan: Return in 1 year with CT angiogram of chest  Breanna Nakayama, MD [facility] [phone]

## 2017-09-30 ENCOUNTER — Other Ambulatory Visit: Payer: Self-pay | Admitting: Family Medicine

## 2017-09-30 DIAGNOSIS — F419 Anxiety disorder, unspecified: Secondary | ICD-10-CM

## 2017-10-01 ENCOUNTER — Ambulatory Visit (INDEPENDENT_AMBULATORY_CARE_PROVIDER_SITE_OTHER): Payer: Medicare Other | Admitting: Family Medicine

## 2017-10-01 ENCOUNTER — Encounter: Payer: Self-pay | Admitting: Family Medicine

## 2017-10-01 VITALS — BP 117/57 | HR 68 | Temp 98.1°F | Resp 18 | Wt 256.0 lb

## 2017-10-01 DIAGNOSIS — B029 Zoster without complications: Secondary | ICD-10-CM

## 2017-10-01 MED ORDER — VALACYCLOVIR HCL 1 G PO TABS: 1000.0000 mg | ORAL_TABLET | Freq: Two times a day (BID) | ORAL | 0 refills | Status: DC

## 2017-10-01 NOTE — Patient Instructions (Signed)
Shingles Shingles, which is also known as herpes zoster, is an infection that causes a painful skin rash and fluid-filled blisters. Shingles is not related to genital herpes, which is a sexually transmitted infection. Shingles only develops in people who:  Have had chickenpox.  Have received the chickenpox vaccine. (This is rare.)  What are the causes? Shingles is caused by varicella-zoster virus (VZV). This is the same virus that causes chickenpox. After exposure to VZV, the virus stays in the body in an inactive (dormant) state. Shingles develops if the virus reactivates. This can happen many years after the initial exposure to VZV. It is not known what causes this virus to reactivate. What increases the risk? People who have had chickenpox or received the chickenpox vaccine are at risk for shingles. Infection is more common in people who:  Are older than age 50.  Have a weakened defense (immune) system, such as those with HIV, AIDS, or cancer.  Are taking medicines that weaken the immune system, such as transplant medicines.  Are under great stress.  What are the signs or symptoms? Early symptoms of this condition include itching, tingling, and pain in an area on your skin. Pain may be described as burning, stabbing, or throbbing. A few days or weeks after symptoms start, a painful red rash appears, usually on one side of the body in a bandlike or beltlike pattern. The rash eventually turns into fluid-filled blisters that break open, scab over, and dry up in about 2-3 weeks. At any time during the infection, you may also develop:  A fever.  Chills.  A headache.  An upset stomach.  How is this diagnosed? This condition is diagnosed with a skin exam. Sometimes, skin or fluid samples are taken from the blisters before a diagnosis is made. These samples are examined under a microscope or sent to a lab for testing. How is this treated? There is no specific cure for this condition.  Your health care provider will probably prescribe medicines to help you manage pain, recover more quickly, and avoid long-term problems. Medicines may include:  Antiviral drugs.  Anti-inflammatory drugs.  Pain medicines.  If the area involved is on your face, you may be referred to a specialist, such as an eye doctor (ophthalmologist) or an ear, nose, and throat (ENT) doctor to help you avoid eye problems, chronic pain, or disability. Follow these instructions at home: Medicines  Take medicines only as directed by your health care provider.  Apply an anti-itch or numbing cream to the affected area as directed by your health care provider. Blister and Rash Care  Take a cool bath or apply cool compresses to the area of the rash or blisters as directed by your health care provider. This may help with pain and itching.  Keep your rash covered with a loose bandage (dressing). Wear loose-fitting clothing to help ease the pain of material rubbing against the rash.  Keep your rash and blisters clean with mild soap and cool water or as directed by your health care provider.  Check your rash every day for signs of infection. These include redness, swelling, and pain that lasts or increases.  Do not pick your blisters.  Do not scratch your rash. General instructions  Rest as directed by your health care provider.  Keep all follow-up visits as directed by your health care provider. This is important.  Until your blisters scab over, your infection can cause chickenpox in people who have never had it or been vaccinated   against it. To prevent this from happening, avoid contact with other people, especially: ? Babies. ? Pregnant women. ? Children who have eczema. ? Elderly people who have transplants. ? People who have chronic illnesses, such as leukemia or AIDS. Contact a health care provider if:  Your pain is not relieved with prescribed medicines.  Your pain does not get better after  the rash heals.  Your rash looks infected. Signs of infection include redness, swelling, and pain that lasts or increases. Get help right away if:  The rash is on your face or nose.  You have facial pain, pain around your eye area, or loss of feeling on one side of your face.  You have ear pain or you have ringing in your ear.  You have loss of taste.  Your condition gets worse. This information is not intended to replace advice given to you by your health care provider. Make sure you discuss any questions you have with your health care provider. Document Released: 03/26/2005 Document Revised: 11/20/2015 Document Reviewed: 02/04/2014 Elsevier Interactive Patient Education  2018 [employer]  

## 2017-10-01 NOTE — Progress Notes (Signed)
Subjective:  I acted as a Education administrator for [employer]. Yancey Flemings, [place]   Patient ID: Breanna Hernandez, female    DOB: 1945-07-25, 72 y.o.   MRN: [id number]  Chief Complaint  Patient presents with  . possible shingles    2 weeks    HPI  Patient is in today for concerns about shingles. She has had a burning sensation on R shoulder x 2 weeks -- no rash.  No injury but she has been cleaning and is right handed.  No pain with movement but even clothes or sheets at night bother her.   Patient Care Team: Carollee Herter, Alferd Apa, DO as PCP - General (Family Medicine) Heath Lark, MD as Consulting Physician (Hematology and Oncology) Sanda Klein, MD as Consulting Physician (Cardiology) Melrose Nakayama, MD as Consulting Physician (Cardiothoracic Surgery) Christophe Louis, MD as Consulting Physician (Obstetrics and Gynecology) Monna Fam, MD as Consulting Physician (Ophthalmology) Philemon Kingdom, MD as Consulting Physician (Internal Medicine)   Past Medical History:  Diagnosis Date  . Arthritis   . Atrial fibrillation ([place])    h/o post-op 04/2010  . Breast cancer ([place])    x 3 cancer - radiation right x2 ''00,'05, left '07 mammosite radiation   . Diverticulosis   . Dysrhythmia    a fib  . Fatty liver 01/10/07  . GERD (gastroesophageal reflux disease)    hx of  . Headache    migraines   . 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 HTN (hypertension)   . Hyperlipidemia   . Hyperplastic colon polyp   . Hypothyroidism   . IBS (irritable bowel syndrome)   . Lower extremity neuropathy    occ. affects walking and balance"foot to knees" nondiabetic  . Obesity   . Renal cyst, left   . Thyroid cancer ([place]) 11/2015   surgery Oct 2017    Past Surgical History:  Procedure Laterality Date  . BREAST LUMPECTOMY  2000   Right x2, Left x1  . CHOLECYSTECTOMY    . COLONOSCOPY W/ POLYPECTOMY    . CORONARY ARTERY BYPASS GRAFT  05/03/2010   CABG X1, RCA -Dr Roxan Hockey (with type 1  aortic dissection)  . HYSTEROSCOPY W/D&C N/A 04/13/2014   Procedure: DILATATION AND CURETTAGE /HYSTEROSCOPY Polypectomy;  Surgeon: Maeola Sarah. Landry Mellow, MD;  Location: [facility];  Service: Gynecology;  Laterality: N/A;  Possible Polypectomy  . JOINT REPLACEMENT     Right total hip 03-15-17 Dr. Ninfa Linden  . Median sternotomy, extracorporeal circulation, repair of type 1 aortic dissection with  tube graft from  sinotubular junction to take off of innominate artery, distal anastomoses under deep bypothermic circulatory arrest  05/03/2010   Dr Roxan Hockey  . ROBOTIC ASSISTED TOTAL HYSTERECTOMY WITH BILATERAL SALPINGO OOPHERECTOMY Bilateral 05/18/2014   Procedure: ROBOTIC ASSISTED TOTAL HYSTERECTOMY WITH BILATERAL SALPINGO OOPHORECTOMY SENTINAL LYMPH NODE MAPPING ;  Surgeon: Everitt Amber, MD;  Location: [facility];  Service: Gynecology;  Laterality: Bilateral;  . THYROIDECTOMY N/A 01/09/2016   Procedure: TOTAL THYROIDECTOMY;  Surgeon: Armandina Gemma, MD;  Location: [facility];  Service: General;  Laterality: N/A;  . TONSILLECTOMY    . TOTAL HIP ARTHROPLASTY Right 03/15/2017   Procedure: RIGHT TOTAL HIP ARTHROPLASTY ANTERIOR APPROACH;  Surgeon: Mcarthur Rossetti, MD;  Location: [facility];  Service: Orthopedics;  Laterality: Right;  . TOTAL THYROIDECTOMY  01/09/2016    Family History  Problem Relation Age of Onset  . Aneurysm Father        CNS  . Heart attack Father  x2  . Alzheimer's disease Mother   . Hypertension Mother   . Goiter Mother   . Hypertension Brother   . Kidney disease Brother   . Colon cancer Neg Hx     Social History   Socioeconomic History  . Marital status: Married    Spouse name: Not on file  . Number of children: Not on file  . Years of education: Not on file  . Highest education level: Not on file  Occupational History  . Not on file  Social Needs  . Financial resource strain: Not on file  . Food insecurity:    Worry: Not on file    Inability: Not on file  . Transportation needs:      Medical: Not on file    Non-medical: Not on file  Tobacco Use  . Smoking status: Never Smoker  . Smokeless tobacco: Never Used  Substance and Sexual Activity  . Alcohol use: No  . Drug use: No  . Sexual activity: Yes  Lifestyle  . Physical activity:    Days per week: Not on file    Minutes per session: Not on file  . Stress: Not on file  Relationships  . Social connections:    Talks on phone: Not on file    Gets together: Not on file    Attends religious service: Not on file    Active member of club or organization: Not on file    Attends meetings of clubs or organizations: Not on file    Relationship status: Not on file  . Intimate partner violence:    Fear of current or ex partner: Not on file    Emotionally abused: Not on file    Physically abused: Not on file    Forced sexual activity: Not on file  Other Topics Concern  . Not on file  Social History Narrative  . Not on file    Outpatient Medications Prior to Visit  Medication Sig Dispense Refill  . aspirin EC 81 MG tablet Take 81 mg by mouth daily.    . benazepril (LOTENSIN) 40 MG tablet Take 1 tablet (40 mg total) by mouth daily. 90 tablet 3  . CALCIUM-MAGNESIUM-VITAMIN D PO Take 1 tablet by mouth 2 (two) times daily.     Marland Kitchen desonide (DESOWEN) 0.05 % cream Apply 1 application topically 3 (three) times a week.    . hydrochlorothiazide (MICROZIDE) 12.5 MG capsule TAKE 2 CAPSULES BY MOUTH DAILY 180 capsule 3  . levothyroxine (SYNTHROID, LEVOTHROID) 175 MCG tablet Take 1 tablet (175 mcg total) by mouth daily before breakfast. 90 tablet 3  . LORazepam (ATIVAN) 1 MG tablet 1 po qd prn 30 tablet 1  . metoprolol tartrate (LOPRESSOR) 50 MG tablet TAKE 1 TABLET BY MOUTH TWICE A DAY. NEEDS APPOINTMENT FOR FUTURE REFILLS (Patient taking differently: TAKE 50 MG BY MOUTH TWICE A DAY.) 60 tablet 11  . mupirocin nasal ointment (BACTROBAN NASAL) 2 % Place 1 application into the nose 2 (two) times daily. Use one-half of tube in each  nostril twice daily for five (5) days. After application, press sides of nose together and gently massage. 10 g 0  . mupirocin ointment (BACTROBAN) 2 % Place 1 application into the nose 2 (two) times daily. 22 g 0  . Omega-3 Fatty Acids (FISH OIL PO) Take 1 capsule by mouth 2 (two) times daily.    Marland Kitchen OVER THE COUNTER MEDICATION Take 1 tablet by mouth every morning. Product Name: EHT Mind enhancement formula  Vitamin d 200 units Vitamin b 1.6 mg Vitamin b-12 10 mcg Selinum 70 mcg Coffee Extract 35 mg Alpha leporic acid 50 mg huperzine-a 50 mcg    . oxyCODONE-acetaminophen (ROXICET) 5-325 MG tablet Take 1-2 tablets by mouth every 6 (six) hours as needed. 60 tablet 0  . Probiotic Product (PROBIOTIC PO) Take 1 capsule by mouth 2 (two) times daily.     No facility-administered medications prior to visit.     No Known Allergies  Review of Systems  Constitutional: Negative for chills, fever and malaise/fatigue.  HENT: Negative for congestion and hearing loss.   Eyes: Negative for discharge.  Respiratory: Negative for cough, sputum production and shortness of breath.   Cardiovascular: Negative for chest pain, palpitations and leg swelling.  Gastrointestinal: Negative for abdominal pain, blood in stool, constipation, diarrhea, heartburn, nausea and vomiting.  Genitourinary: Negative for dysuria, frequency, hematuria and urgency.  Musculoskeletal: Negative for back pain, falls and myalgias.  Skin: Positive for itching. Negative for rash.  Neurological: Negative for dizziness, sensory change, loss of consciousness, weakness and headaches.  Endo/Heme/Allergies: Negative for environmental allergies. Does not bruise/bleed easily.  Psychiatric/Behavioral: Negative for depression and suicidal ideas. The patient is not nervous/anxious and does not have insomnia.        Objective:    Physical Exam  Constitutional: She is oriented to person, place, and time. She appears well-developed and  well-nourished.  HENT:  Head: Normocephalic and atraumatic.  Eyes: Conjunctivae and EOM are normal.  Neck: Normal range of motion. Neck supple. No JVD present. Carotid bruit is not present. No thyromegaly present.  Cardiovascular: Normal rate, regular rhythm and normal heart sounds.  No murmur heard. Pulmonary/Chest: Effort normal and breath sounds normal. No respiratory distress. She has no wheezes. She has no rales. She exhibits no tenderness.  Musculoskeletal: She exhibits no edema.  Neurological: She is alert and oriented to person, place, and time.  Skin: Skin is dry. No rash noted. No erythema.     Psychiatric: She has a normal mood and affect.  Nursing note and vitals reviewed.   BP (!) 117/57 (BP Location: Left Arm, Patient Position: Sitting, Cuff Size: Large)   Pulse 68   Temp 98.1 F (36.7 C) (Oral)   Resp 18   Wt 256 lb (116.1 kg)   SpO2 97%   BMI 43.94 kg/m  Wt Readings from Last 3 Encounters:  10/01/17 256 lb (116.1 kg)  09/24/17 252 lb (114.3 kg)  05/17/17 253 lb (114.8 kg)   BP Readings from Last 3 Encounters:  10/01/17 (!) 117/57  09/24/17 115/65  05/17/17 130/82     Immunization History  Administered Date(s) Administered  . Influenza Split 04/17/2011  . Pneumococcal Conjugate-13 05/09/2017    Health Maintenance  Topic Date Due  . TETANUS/TDAP  03/14/1963  . INFLUENZA VACCINE  01/10/2024 (Originally 11/07/2017)  . PNA vac Low Risk Adult (2 of 2 - PPSV23) 05/09/2018  . MAMMOGRAM  11/29/2018  . COLONOSCOPY  02/02/2020  . DEXA SCAN  Completed  . Hepatitis C Screening  Completed    Lab Results  Component Value Date   WBC 7.7 05/09/2017   HGB 13.7 05/09/2017   HCT 39.4 05/09/2017   PLT 155.0 05/09/2017   GLUCOSE 105 (H) 05/09/2017   CHOL 232 (H) 05/09/2017   TRIG 197.0 (H) 05/09/2017   HDL 39.30 05/09/2017   LDLDIRECT 148.3 03/29/2006   LDLCALC 153 (H) 05/09/2017   ALT 49 (H) 05/09/2017   AST 59 (H) 05/09/2017  NA 139 05/09/2017   K 3.9  05/09/2017   CL 100 05/09/2017   CREATININE 0.78 05/09/2017   BUN 18 05/09/2017   CO2 32 05/09/2017   TSH 2.72 05/17/2017   INR 2 09/01/2014   HGBA1C 5.8 08/17/2014    Lab Results  Component Value Date   TSH 2.72 05/17/2017   Lab Results  Component Value Date   WBC 7.7 05/09/2017   HGB 13.7 05/09/2017   HCT 39.4 05/09/2017   MCV 102.2 (H) 05/09/2017   PLT 155.0 05/09/2017   Lab Results  Component Value Date   NA 139 05/09/2017   K 3.9 05/09/2017   CHLORIDE 102 11/03/2015   CO2 32 05/09/2017   GLUCOSE 105 (H) 05/09/2017   BUN 18 05/09/2017   CREATININE 0.78 05/09/2017   BILITOT 1.2 05/09/2017   ALKPHOS 91 05/09/2017   AST 59 (H) 05/09/2017   ALT 49 (H) 05/09/2017   PROT 7.7 05/09/2017   ALBUMIN 4.1 05/09/2017   CALCIUM 10.0 05/09/2017   ANIONGAP 9 03/16/2017   EGFR 61 (L) 11/03/2015   GFR 76.91 05/09/2017   Lab Results  Component Value Date   CHOL 232 (H) 05/09/2017   Lab Results  Component Value Date   HDL 39.30 05/09/2017   Lab Results  Component Value Date   LDLCALC 153 (H) 05/09/2017   Lab Results  Component Value Date   TRIG 197.0 (H) 05/09/2017   Lab Results  Component Value Date   CHOLHDL 6 05/09/2017   Lab Results  Component Value Date   HGBA1C 5.8 08/17/2014         Assessment & Plan:   Problem List Items Addressed This Visit    None    Visit Diagnoses    Herpes zoster without complication    -  Primary   Relevant Medications   valACYclovir (VALTREX) 1000 MG tablet    suspected shingles with no rash-- if symptoms do  Not resolve---- rto  I am having [employer] start on valACYclovir. I am also having her maintain her CALCIUM-MAGNESIUM-VITAMIN D PO, OVER THE COUNTER MEDICATION, Probiotic Product (PROBIOTIC PO), Omega-3 Fatty Acids (FISH OIL PO), metoprolol tartrate, benazepril, desonide, oxyCODONE-acetaminophen, aspirin EC, LORazepam, levothyroxine, mupirocin nasal ointment, hydrochlorothiazide, and mupirocin ointment.  Meds  ordered this encounter  Medications  . valACYclovir (VALTREX) 1000 MG tablet    Sig: Take 1 tablet (1,000 mg total) by mouth 2 (two) times daily.    Dispense:  30 tablet    Refill:  0    CMA served as scribe during this visit. History, Physical and Plan performed by medical provider. Documentation and orders reviewed and attested to.  Ann Held, DO

## 2017-10-02 NOTE — Telephone Encounter (Signed)
Requesting:ATIVAN 1 MG Contract:05/09/17 UDS:05/09/17 Last OV:10/01/17 Next OV:- Last Refill:05/09/17   Please advise

## 2017-10-28 ENCOUNTER — Other Ambulatory Visit: Payer: Self-pay | Admitting: Obstetrics and Gynecology

## 2017-10-28 DIAGNOSIS — Z1231 Encounter for screening mammogram for malignant neoplasm of breast: Secondary | ICD-10-CM

## 2017-11-04 ENCOUNTER — Ambulatory Visit (INDEPENDENT_AMBULATORY_CARE_PROVIDER_SITE_OTHER): Payer: Medicare Other | Admitting: Orthopaedic Surgery

## 2017-11-20 ENCOUNTER — Ambulatory Visit (INDEPENDENT_AMBULATORY_CARE_PROVIDER_SITE_OTHER): Payer: Medicare Other | Admitting: Orthopaedic Surgery

## 2017-11-29 ENCOUNTER — Ambulatory Visit
Admission: RE | Admit: 2017-11-29 | Discharge: 2017-11-29 | Disposition: A | Discharge status: Home / Self Care | Payer: Medicare Other | Source: Ambulatory Visit | Attending: Obstetrics and Gynecology | Admitting: Obstetrics and Gynecology

## 2017-11-29 DIAGNOSIS — Z1231 Encounter for screening mammogram for malignant neoplasm of breast: Secondary | ICD-10-CM

## 2017-12-21 ENCOUNTER — Other Ambulatory Visit: Payer: Self-pay | Admitting: Cardiovascular Disease

## 2018-01-23 ENCOUNTER — Other Ambulatory Visit: Payer: Self-pay | Admitting: Family Medicine

## 2018-01-23 DIAGNOSIS — F419 Anxiety disorder, unspecified: Secondary | ICD-10-CM

## 2018-01-23 NOTE — Telephone Encounter (Signed)
Pt is requesting refill on lorazepam.   Last OV: 10/01/2017 Last Fill: 10/02/2017 #30 and 1RF UDS: 05/09/2017 Low risk

## 2018-02-21 ENCOUNTER — Other Ambulatory Visit: Payer: Self-pay

## 2018-02-28 ENCOUNTER — Ambulatory Visit: Payer: Self-pay

## 2018-02-28 NOTE — Telephone Encounter (Signed)
Pt c/o left flank pain that comes and goes for the past 6 weeks. Pt stated that the pain occurs shortly after she wakes up. Pt rated that pain as moderate. Pt stated the pain stop within 20 minutes. Pt stated she called because she is noticing that it is occurring more often. Pt thinks that her pain could be muscular. Pt stated she has chronic leg weakness form peripheral neuropathy. Pt denies fever, abdominal pain, vomiting, burning with urination or blood in urine. Pt stated she took a dose of hydrocodone she had left over from am and stated she did not have the flank pain when she woke up in the morning.  Pt stated she was not having any other symptoms. Care advice given. Pt verbalized understanding. Appointment made for pt 03/04/18 with Dr. Carollee Herter.  Reason for Disposition . MODERATE pain (e.g., interferes with normal activities or awakens from sleep)  Answer Assessment - Initial Assessment Questions 1. LOCATION: "Where does it hurt?" (e.g., left, right)     left 2. ONSET: "When did the pain start?"     6 weeks ago 3. SEVERITY: "How bad is the pain?" (e.g., Scale 1-10; mild, moderate, or severe)   - MILD (1-3): doesn't interfere with normal activities    - MODERATE (4-7): interferes with normal activities or awakens from sleep    - SEVERE (8-10): excruciating pain and patient unable to do normal activities (stays in bed)       moderate 4. PATTERN: "Does the pain come and go, or is it constant?"      Comes and goes 5. CAUSE: "What do you think is causing the pain?"     Muscular pain 6. OTHER SYMPTOMS:  "Do you have any other symptoms?" (e.g., fever, abdominal pain, vomiting, leg weakness, burning with urination, blood in urine)     Chronic leg weakness from peripheral neuropathy 7. PREGNANCY:  "Is there any chance you are pregnant?" "When was your last menstrual period?"     n/a  Protocols used: FLANK PAIN-A-AH

## 2018-03-04 ENCOUNTER — Ambulatory Visit (HOSPITAL_BASED_OUTPATIENT_CLINIC_OR_DEPARTMENT_OTHER)
Admission: RE | Admit: 2018-03-04 | Discharge: 2018-03-04 | Disposition: A | Discharge status: Home / Self Care | Payer: Medicare Other | Source: Ambulatory Visit | Attending: Family Medicine | Admitting: Family Medicine

## 2018-03-04 ENCOUNTER — Ambulatory Visit (INDEPENDENT_AMBULATORY_CARE_PROVIDER_SITE_OTHER): Payer: Medicare Other | Admitting: Family Medicine

## 2018-03-04 ENCOUNTER — Encounter: Payer: Self-pay | Admitting: Family Medicine

## 2018-03-04 VITALS — BP 136/53 | HR 56 | Temp 97.9°F | Resp 16 | Ht 64.0 in | Wt 256.0 lb

## 2018-03-04 DIAGNOSIS — G2581 Restless legs syndrome: Secondary | ICD-10-CM

## 2018-03-04 DIAGNOSIS — R109 Unspecified abdominal pain: Secondary | ICD-10-CM | POA: Diagnosis present

## 2018-03-04 DIAGNOSIS — G4700 Insomnia, unspecified: Secondary | ICD-10-CM | POA: Diagnosis not present

## 2018-03-04 DIAGNOSIS — I10 Essential (primary) hypertension: Secondary | ICD-10-CM

## 2018-03-04 DIAGNOSIS — E785 Hyperlipidemia, unspecified: Secondary | ICD-10-CM | POA: Diagnosis not present

## 2018-03-04 LAB — POC URINALSYSI DIPSTICK (AUTOMATED)
Bilirubin, UA: NEGATIVE
Glucose, UA: NEGATIVE
Ketones, UA: NEGATIVE
LEUKOCYTES UA: NEGATIVE
NITRITE UA: NEGATIVE
PH UA: 6 (ref 5.0–8.0)
PROTEIN UA: NEGATIVE
Spec Grav, UA: 1.02 (ref 1.010–1.025)
Urobilinogen, UA: 0.2 E.U./dL

## 2018-03-04 MED ORDER — METHOCARBAMOL 500 MG PO TABS: 500.0000 mg | ORAL_TABLET | Freq: Four times a day (QID) | ORAL | 0 refills | Status: DC

## 2018-03-04 MED ORDER — TRAZODONE HCL 50 MG PO TABS: 25.0000 mg | ORAL_TABLET | Freq: Every evening | ORAL | 3 refills | Status: DC | PRN

## 2018-03-04 NOTE — Patient Instructions (Signed)
Flank Pain, Adult Flank pain is pain that is located on the side of the body between the upper abdomen and the back. This area is called the flank. The pain may occur over a short period of time (acute), or it may be long-term or recurring (chronic). It may be mild or severe. Flank pain can be caused by many things, including:  Muscle soreness or injury.  Kidney stones or kidney disease.  Stress.  A disease of the spine (vertebral disk disease).  A lung infection (pneumonia).  Fluid around the lungs (pulmonary edema).  A skin rash caused by the chickenpox virus (shingles).  Tumors that affect the back of the abdomen.  Gallbladder disease.  Follow these instructions at home:  Drink enough fluid to keep your urine clear or pale yellow.  Rest as told by your health care provider.  Take over-the-counter and prescription medicines only as told by your health care provider.  Keep a journal to track what has caused your flank pain and what has made it feel better.  Keep all follow-up visits as told by your health care provider. This is important. Contact a health care provider if:  Your pain is not controlled with medicine.  You have new symptoms.  Your pain gets worse.  You have a fever.  Your symptoms last longer than 2-3 days.  You have trouble urinating or you are urinating very frequently. Get help right away if:  You have trouble breathing or you are short of breath.  Your abdomen hurts or it is swollen or red.  You have nausea or vomiting.  You feel faint or you pass out.  You have blood in your urine. Summary  Flank pain is pain that is located on the side of the body between the upper abdomen and the back.  The pain may occur over a short period of time (acute), or it may be long-term or recurring (chronic). It may be mild or severe.  Flank pain can be caused by many things.  Contact your health care provider if your symptoms get worse or they last  longer than 2-3 days. This information is not intended to replace advice given to you by your health care provider. Make sure you discuss any questions you have with your health care provider. Document Released: 05/17/2005 Document Revised: 06/08/2016 Document Reviewed: 06/08/2016 Elsevier Interactive Patient Education  2018 [employer]  

## 2018-03-04 NOTE — Progress Notes (Signed)
Patient ID: Breanna Hernandez, female    DOB: 1945/07/24  Age: 72 y.o. MRN: [id number]    Subjective:  Subjective  HPI Breanna Hernandez presents for left flank pain that started about 2 months ago.  No known injury.  No urinary symptoms.   She also c/o insomnia-- she is not sleeping well.    Review of Systems  Constitutional: Negative for appetite change, diaphoresis, fatigue and unexpected weight change.  Eyes: Negative for pain, redness and visual disturbance.  Respiratory: Negative for cough, chest tightness, shortness of breath and wheezing.   Cardiovascular: Negative for chest pain, palpitations and leg swelling.  Endocrine: Negative for cold intolerance, heat intolerance, polydipsia, polyphagia and polyuria.  Genitourinary: Positive for flank pain. Negative for decreased urine volume, difficulty urinating, dysuria, frequency, pelvic pain, urgency and vaginal bleeding.  Neurological: Negative for dizziness, light-headedness, numbness and headaches.    History Past Medical History:  Diagnosis Date  . Arthritis   . Atrial fibrillation ([place])    h/o post-op 04/2010  . Breast cancer ([place])    x 3 cancer - radiation right x2 ''00,'05, left '07 mammosite radiation   . Diverticulosis   . Dysrhythmia    a fib  . Fatty liver 01/10/07  . GERD (gastroesophageal reflux disease)    hx of  . Headache    migraines   . 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 HTN (hypertension)   . Hyperlipidemia   . Hyperplastic colon polyp   . Hypothyroidism   . IBS (irritable bowel syndrome)   . Lower extremity neuropathy    occ. affects walking and balance"foot to knees" nondiabetic  . Obesity   . Renal cyst, left   . Thyroid cancer ([place]) 11/2015   surgery Oct 2017    She has a past surgical history that includes Cholecystectomy; Tonsillectomy; Colonoscopy w/ polypectomy; Median sternotomy, extracorporeal circulation, repair of type 1 aortic dissection with  tube graft from  sinotubular  junction to take off of innominate artery, distal anastomoses under deep bypothermic circulatory arrest (05/03/2010); Hysteroscopy w/D&C (N/A, 04/13/2014); Robotic assisted total hysterectomy with bilateral salpingo oophorectomy (Bilateral, 05/18/2014); Total thyroidectomy (01/09/2016); Thyroidectomy (N/A, 01/09/2016); Breast lumpectomy (2000); Coronary artery bypass graft (05/03/2010); Joint replacement; and Total hip arthroplasty (Right, 03/15/2017).   Her family history includes Alzheimer's disease in her mother; Aneurysm in her father; Goiter in her mother; Heart attack in her father; Hypertension in her brother and mother; Kidney disease in her brother.She reports that she has never smoked. She has never used smokeless tobacco. She reports that she does not drink alcohol or use drugs.  Current Outpatient Medications on File Prior to Visit  Medication Sig Dispense Refill  . benazepril (LOTENSIN) 40 MG tablet Take 1 tablet (40 mg total) by mouth daily. 90 tablet 3  . CALCIUM-MAGNESIUM-VITAMIN D PO Take 1 tablet by mouth 2 (two) times daily.     Marland Kitchen desonide (DESOWEN) 0.05 % cream Apply 1 application topically 3 (three) times a week.    . hydrochlorothiazide (MICROZIDE) 12.5 MG capsule TAKE 2 CAPSULES BY MOUTH DAILY 180 capsule 3  . levothyroxine (SYNTHROID, LEVOTHROID) 175 MCG tablet Take 1 tablet (175 mcg total) by mouth daily before breakfast. 90 tablet 3  . metoprolol tartrate (LOPRESSOR) 50 MG tablet TAKE 1 TABLET BY MOUTH TWICE A DAY. NEEDS APPOINTMENT FOR FUTURE REFILLS 180 tablet 3  . mupirocin nasal ointment (BACTROBAN NASAL) 2 % Place 1 application into the nose 2 (two) times daily. Use one-half of tube  in each nostril twice daily for five (5) days. After application, press sides of nose together and gently massage. 10 g 0  . mupirocin ointment (BACTROBAN) 2 % Place 1 application into the nose 2 (two) times daily. 22 g 0  . Omega-3 Fatty Acids (FISH OIL PO) Take 1 capsule by mouth 2 (two) times  daily.    Marland Kitchen OVER THE COUNTER MEDICATION Take 1 tablet by mouth every morning. Product Name: EHT Mind enhancement formula Vitamin d 200 units Vitamin b 1.6 mg Vitamin b-12 10 mcg Selinum 70 mcg Coffee Extract 35 mg Alpha leporic acid 50 mg huperzine-a 50 mcg    . Probiotic Product (PROBIOTIC PO) Take 1 capsule by mouth 2 (two) times daily.    . valACYclovir (VALTREX) 1000 MG tablet Take 1 tablet (1,000 mg total) by mouth 2 (two) times daily. 30 tablet 0  . aspirin EC 81 MG tablet Take 81 mg by mouth daily.    . [DISCONTINUED] amLODipine-benazepril (LOTREL) 5-20 MG per capsule Take 1 capsule by mouth daily.      . [DISCONTINUED] escitalopram (LEXAPRO) 10 MG tablet Take 10 mg by mouth daily.      . [DISCONTINUED] lisinopril (PRINIVIL,ZESTRIL) 20 MG tablet Take 20 mg by mouth daily.       No current facility-administered medications on file prior to visit.      Objective:  Objective  Physical Exam  Constitutional: She is oriented to person, place, and time. She appears well-developed and well-nourished.  HENT:  Head: Normocephalic and atraumatic.  Eyes: Conjunctivae and EOM are normal.  Neck: Normal range of motion. Neck supple. No JVD present. Carotid bruit is not present. No thyromegaly present.  Cardiovascular: Normal rate, regular rhythm and normal heart sounds.  No murmur heard. Pulmonary/Chest: Effort normal and breath sounds normal. No respiratory distress. She has no wheezes. She has no rales. She exhibits no tenderness.  Musculoskeletal: She exhibits no edema.  Neurological: She is alert and oriented to person, place, and time.  Psychiatric: She has a normal mood and affect.  Nursing note and vitals reviewed.  BP (!) 136/53 (BP Location: Left Arm, Patient Position: Sitting, Cuff Size: Large)   Pulse (!) 56   Temp 97.9 F (36.6 C) (Oral)   Resp 16   Ht 5\' 4"  (1.626 m)   Wt 256 lb (116.1 kg)   SpO2 96%   BMI 43.94 kg/m  Wt Readings from Last 3 Encounters:  03/04/18  256 lb (116.1 kg)  10/01/17 256 lb (116.1 kg)  09/24/17 252 lb (114.3 kg)     Lab Results  Component Value Date   WBC 7.8 03/04/2018   HGB 13.9 03/04/2018   HCT 40.5 03/04/2018   PLT 173.0 03/04/2018   GLUCOSE 92 03/04/2018   CHOL 248 (H) 03/04/2018   TRIG 219.0 (H) 03/04/2018   HDL 38.70 (L) 03/04/2018   LDLDIRECT 177.0 03/04/2018   LDLCALC 153 (H) 05/09/2017   ALT 38 (H) 03/04/2018   AST 49 (H) 03/04/2018   NA 138 03/04/2018   K 3.9 03/04/2018   CL 101 03/04/2018   CREATININE 0.95 03/04/2018   BUN 19 03/04/2018   CO2 27 03/04/2018   TSH 2.72 05/17/2017   INR 2 09/01/2014   HGBA1C 5.8 08/17/2014    Mm 3d Screen Breast Bilateral  Result Date: 11/29/2017 CLINICAL DATA:  Screening. EXAM: DIGITAL SCREENING BILATERAL MAMMOGRAM WITH TOMO AND CAD COMPARISON:  Previous exam(s). ACR Breast Density Category b: There are scattered areas of fibroglandular density.  FINDINGS: There are no findings suspicious for malignancy. Bilateral post lumpectomy changes. Images were processed with CAD. IMPRESSION: No mammographic evidence of malignancy. A result letter of this screening mammogram will be mailed directly to the patient. RECOMMENDATION: Screening mammogram in one year. (Code:SM-B-01Y) BI-RADS CATEGORY  2: Benign. Electronically Signed   By: Lajean Manes M.D.   On: 11/29/2017 16:43     Assessment & Plan:  Plan  I have discontinued Breanna Hernandez's oxyCODONE-acetaminophen and LORazepam. I am also having her start on traZODone and methocarbamol. Additionally, I am having her maintain her CALCIUM-MAGNESIUM-VITAMIN D PO, OVER THE COUNTER MEDICATION, Probiotic Product (PROBIOTIC PO), Omega-3 Fatty Acids (FISH OIL PO), benazepril, desonide, aspirin EC, levothyroxine, mupirocin nasal ointment, hydrochlorothiazide, mupirocin ointment, valACYclovir, and metoprolol tartrate.  Meds ordered this encounter  Medications  . traZODone (DESYREL) 50 MG tablet    Sig: Take 0.5-1 tablets (25-50 mg total)  by mouth at bedtime as needed for sleep.    Dispense:  30 tablet    Refill:  3  . methocarbamol (ROBAXIN) 500 MG tablet    Sig: Take 1 tablet (500 mg total) by mouth 4 (four) times daily.    Dispense:  30 tablet    Refill:  0    Problem List Items Addressed This Visit      Unprioritized   Dyslipidemia    Encouraged heart healthy diet, increase exercise, avoid trans fats, consider a krill oil cap daily      Essential hypertension    Well controlled, no changes to meds. Encouraged heart healthy diet such as the DASH diet and exercise as tolerated.        Other Visit Diagnoses    Flank pain    -  Primary   Relevant Medications   methocarbamol (ROBAXIN) 500 MG tablet   Other Relevant Orders   POCT Urinalysis Dipstick (Automated) (Completed)   Lipid panel (Completed)   Comprehensive metabolic panel (Completed)   DG Lumbar Spine Complete (Completed)   DG Thoracic Spine 2 View (Completed)   CBC with Differential/Platelet (Completed)   Morbid obesity (HCC)       Relevant Orders   Amb Ref to Medical Weight Management   Insomnia, unspecified type       Relevant Medications   traZODone (DESYREL) 50 MG tablet   Other Relevant Orders   CBC with Differential/Platelet (Completed)   Hyperlipidemia, unspecified hyperlipidemia type       Relevant Orders   Lipid panel (Completed)   Comprehensive metabolic panel (Completed)   LDL cholesterol, direct (Completed)   RLS (restless legs syndrome)       Relevant Orders   CBC with Differential/Platelet (Completed)      Follow-up: Return if symptoms worsen or fail to improve.  Ann Held, DO

## 2018-03-05 LAB — CBC WITH DIFFERENTIAL/PLATELET
Basophils Absolute: 0.1 K/uL (ref 0.0–0.1)
Basophils Relative: 1.3 % (ref 0.0–3.0)
Eosinophils Absolute: 0 K/uL (ref 0.0–0.7)
Eosinophils Relative: 0.3 % (ref 0.0–5.0)
HCT: 40.5 % (ref 36.0–46.0)
Hemoglobin: 13.9 g/dL (ref 12.0–15.0)
Lymphocytes Relative: 25.6 % (ref 12.0–46.0)
Lymphs Abs: 2 K/uL (ref 0.7–4.0)
MCHC: 34.4 g/dL (ref 30.0–36.0)
MCV: 101.3 fl — ABNORMAL HIGH (ref 78.0–100.0)
Monocytes Absolute: 1.1 K/uL — ABNORMAL HIGH (ref 0.1–1.0)
Monocytes Relative: 14.3 % — ABNORMAL HIGH (ref 3.0–12.0)
Neutro Abs: 4.5 K/uL (ref 1.4–7.7)
Neutrophils Relative %: 58.5 % (ref 43.0–77.0)
Platelets: 173 K/uL (ref 150.0–400.0)
RBC: 4 Mil/uL (ref 3.87–5.11)
RDW: 12.8 % (ref 11.5–15.5)
WBC: 7.8 K/uL (ref 4.0–10.5)

## 2018-03-05 LAB — COMPREHENSIVE METABOLIC PANEL WITH GFR
ALT: 38 U/L — ABNORMAL HIGH (ref 0–35)
AST: 49 U/L — ABNORMAL HIGH (ref 0–37)
Albumin: 4.1 g/dL (ref 3.5–5.2)
Alkaline Phosphatase: 88 U/L (ref 39–117)
BUN: 19 mg/dL (ref 6–23)
CO2: 27 meq/L (ref 19–32)
Calcium: 9.7 mg/dL (ref 8.4–10.5)
Chloride: 101 meq/L (ref 96–112)
Creatinine, Ser: 0.95 mg/dL (ref 0.40–1.20)
GFR: 61.12 mL/min
Glucose, Bld: 92 mg/dL (ref 70–99)
Potassium: 3.9 meq/L (ref 3.5–5.1)
Sodium: 138 meq/L (ref 135–145)
Total Bilirubin: 1.2 mg/dL (ref 0.2–1.2)
Total Protein: 7.4 g/dL (ref 6.0–8.3)

## 2018-03-05 LAB — LIPID PANEL
Cholesterol: 248 mg/dL — ABNORMAL HIGH (ref 0–200)
HDL: 38.7 mg/dL — ABNORMAL LOW
NonHDL: 209.68
Total CHOL/HDL Ratio: 6
Triglycerides: 219 mg/dL — ABNORMAL HIGH (ref 0.0–149.0)
VLDL: 43.8 mg/dL — ABNORMAL HIGH (ref 0.0–40.0)

## 2018-03-05 LAB — LDL CHOLESTEROL, DIRECT: Direct LDL: 177 mg/dL

## 2018-03-07 ENCOUNTER — Encounter: Payer: Self-pay | Admitting: Family Medicine

## 2018-03-07 NOTE — Assessment & Plan Note (Signed)
Encouraged heart healthy diet, increase exercise, avoid trans fats, consider a krill oil cap daily 

## 2018-03-07 NOTE — Assessment & Plan Note (Signed)
Well controlled, no changes to meds. Encouraged heart healthy diet such as the DASH diet and exercise as tolerated.  °

## 2018-03-18 ENCOUNTER — Other Ambulatory Visit: Payer: Self-pay | Admitting: Physician Assistant

## 2018-03-18 ENCOUNTER — Ambulatory Visit (INDEPENDENT_AMBULATORY_CARE_PROVIDER_SITE_OTHER): Payer: Medicare Other | Admitting: Physician Assistant

## 2018-03-18 DIAGNOSIS — E039 Hypothyroidism, unspecified: Secondary | ICD-10-CM | POA: Diagnosis not present

## 2018-03-18 DIAGNOSIS — I25709 Atherosclerosis of coronary artery bypass graft(s), unspecified, with unspecified angina pectoris: Secondary | ICD-10-CM | POA: Diagnosis not present

## 2018-03-18 DIAGNOSIS — I480 Paroxysmal atrial fibrillation: Secondary | ICD-10-CM

## 2018-03-18 DIAGNOSIS — I10 Essential (primary) hypertension: Secondary | ICD-10-CM

## 2018-03-18 DIAGNOSIS — I7101 Dissection of thoracic aorta: Secondary | ICD-10-CM

## 2018-03-18 DIAGNOSIS — I71019 Dissection of thoracic aorta, unspecified: Secondary | ICD-10-CM

## 2018-03-18 DIAGNOSIS — E785 Hyperlipidemia, unspecified: Secondary | ICD-10-CM

## 2018-03-18 MED ORDER — METOPROLOL TARTRATE 50 MG PO TABS: ORAL_TABLET | ORAL | 3 refills | Status: DC

## 2018-03-18 NOTE — Patient Instructions (Addendum)
Medication Instructions:  The current medical regimen is effective;  continue present plan and medications.  If you need a refill on your cardiac medications before your next appointment, please call your pharmacy.   Lab work: None ordered. If you have labs (blood work) drawn today and your tests are completely normal, you will receive your results only by: Marland Kitchen MyChart Message (if you have MyChart) OR . A paper copy in the mail If you have any lab test that is abnormal or we need to change your treatment, we will call you to review the results.  Testing/Procedures: None ordered.  Follow-Up: At [facility], you and your health needs are our priority.  As part of our continuing mission to provide you with exceptional heart care, we have created designated Provider Care Teams.  These Care Teams include your primary Cardiologist (physician) and Advanced Practice Providers (APPs -  Physician Assistants and Nurse Practitioners) who all work together to provide you with the care you need, when you need it. You will need a follow up appointment in 3 months. You may see Dr.Croitoru or one of the following Advanced Practice Providers on your designated Care Team: Almyra Deforest, [place] . Fabian Sharp, PA-C  Any Other Special Instructions Will Be Listed Below (If Applicable). Look into the two medications that are recommended: >Repatha >Praluent   Call us if any changes in chest pains.

## 2018-03-18 NOTE — Progress Notes (Signed)
Cardiology Office Note    Date:  03/21/2018   ID:  Breanna Hernandez, DOB 1946-01-18, MRN [id number]  PCP:  Carollee Herter, Alferd Apa, DO  Cardiologist: Dr. Sallyanne Kuster  Chief Complaint  Patient presents with  . Follow-up    seen for Dr. Sallyanne Kuster    History of Present Illness:  Breanna Hernandez is a 72 y.o. female with PMH of type I aortic dissection in 2012 requiring urgent surgery, postop A. fib, hypertension, hypothyroidism, history of breast cancer s/p surgery and radiation therapy, and history of SVG to RCA (as she was noted to have plaque in her RCA during the surgery).  There was suspicion that she may have coronary artery disease in other area as well, however she never had a cardiac catheterization.  Myoview in 2015 was low risk.  She was last seen by Kerin Ransom on 02/07/2017 for preop clearance.  A Myoview was repeated on 02/15/2017 which showed EF greater than 65%, low risk Myoview without ischemia or infarction.  Last CT angiogram of the chest obtained on 09/24/2017 showed stable appearance of the thoracic aortic graft and a negative thoracic aorta, no evidence of thoracic aortic aneurysm or dissection, stable small splenic artery aneurysm.  Patient was last seen by Dr. Roxan Hockey in June 2019 who recommended annual follow-up.  Patient presents today for cardiology office visit.  Her cholesterol remains uncontrolled based on the recent lab work, I did discuss with her possibility of PCSK9 inhibitor.  She will look up more information once she gets home.  On further discussion, she has been having burning sensation in her chest roughly twice weekly basis for the past 3 years.  This has not been increasing frequency or duration.  I recommend continue to observe the symptom for now. The symptom only last about a minute each time.  I will bring her back in 3 month for reassessment.  If her symptom does increase in frequency or duration, I have very low threshold to recommend cardiac catheterization  for definitive evaluation.   Past Medical History:  Diagnosis Date  . Arthritis   . Atrial fibrillation ([place])    h/o post-op 04/2010  . Breast cancer ([place])    x 3 cancer - radiation right x2 ''00,'05, left '07 mammosite radiation   . Diverticulosis   . Dysrhythmia    a fib  . Fatty liver 01/10/07  . GERD (gastroesophageal reflux disease)    hx of  . Headache    migraines   . 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 HTN (hypertension)   . Hyperlipidemia   . Hyperplastic colon polyp   . Hypothyroidism   . IBS (irritable bowel syndrome)   . Lower extremity neuropathy    occ. affects walking and balance"foot to knees" nondiabetic  . Obesity   . Renal cyst, left   . Thyroid cancer ([place]) 11/2015   surgery Oct 2017    Past Surgical History:  Procedure Laterality Date  . BREAST LUMPECTOMY  2000   Right x2, Left x1  . CHOLECYSTECTOMY    . COLONOSCOPY W/ POLYPECTOMY    . CORONARY ARTERY BYPASS GRAFT  05/03/2010   CABG X1, RCA -Dr Roxan Hockey (with type 1 aortic dissection)  . HYSTEROSCOPY W/D&C N/A 04/13/2014   Procedure: DILATATION AND CURETTAGE /HYSTEROSCOPY Polypectomy;  Surgeon: Maeola Sarah. Landry Mellow, MD;  Location: [facility];  Service: Gynecology;  Laterality: N/A;  Possible Polypectomy  . JOINT REPLACEMENT     Right total hip 03-15-17 Dr.  Ninfa Linden  . Median sternotomy, extracorporeal circulation, repair of type 1 aortic dissection with  tube graft from  sinotubular junction to take off of innominate artery, distal anastomoses under deep bypothermic circulatory arrest  05/03/2010   Dr Roxan Hockey  . ROBOTIC ASSISTED TOTAL HYSTERECTOMY WITH BILATERAL SALPINGO OOPHERECTOMY Bilateral 05/18/2014   Procedure: ROBOTIC ASSISTED TOTAL HYSTERECTOMY WITH BILATERAL SALPINGO OOPHORECTOMY SENTINAL LYMPH NODE MAPPING ;  Surgeon: Everitt Amber, MD;  Location: [facility];  Service: Gynecology;  Laterality: Bilateral;  . THYROIDECTOMY N/A 01/09/2016   Procedure: TOTAL THYROIDECTOMY;  Surgeon: Armandina Gemma,  MD;  Location: [facility];  Service: General;  Laterality: N/A;  . TONSILLECTOMY    . TOTAL HIP ARTHROPLASTY Right 03/15/2017   Procedure: RIGHT TOTAL HIP ARTHROPLASTY ANTERIOR APPROACH;  Surgeon: Mcarthur Rossetti, MD;  Location: [facility];  Service: Orthopedics;  Laterality: Right;  . TOTAL THYROIDECTOMY  01/09/2016    Current Medications: Outpatient Medications Prior to Visit  Medication Sig Dispense Refill  . aspirin EC 81 MG tablet Take 81 mg by mouth daily.    . benazepril (LOTENSIN) 40 MG tablet Take 1 tablet (40 mg total) by mouth daily. 90 tablet 3  . CALCIUM-MAGNESIUM-VITAMIN D PO Take 1 tablet by mouth 2 (two) times daily.     Marland Kitchen desonide (DESOWEN) 0.05 % cream Apply 1 application topically 3 (three) times a week.    . hydrochlorothiazide (MICROZIDE) 12.5 MG capsule TAKE 2 CAPSULES BY MOUTH DAILY 180 capsule 3  . levothyroxine (SYNTHROID, LEVOTHROID) 175 MCG tablet Take 1 tablet (175 mcg total) by mouth daily before breakfast. 90 tablet 3  . methocarbamol (ROBAXIN) 500 MG tablet Take 1 tablet (500 mg total) by mouth 4 (four) times daily. 30 tablet 0  . mupirocin nasal ointment (BACTROBAN NASAL) 2 % Place 1 application into the nose 2 (two) times daily. Use one-half of tube in each nostril twice daily for five (5) days. After application, press sides of nose together and gently massage. 10 g 0  . mupirocin ointment (BACTROBAN) 2 % Place 1 application into the nose 2 (two) times daily. 22 g 0  . Omega-3 Fatty Acids (FISH OIL PO) Take 1 capsule by mouth 2 (two) times daily.    Marland Kitchen OVER THE COUNTER MEDICATION Take 1 tablet by mouth every morning. Product Name: EHT Mind enhancement formula Vitamin d 200 units Vitamin b 1.6 mg Vitamin b-12 10 mcg Selinum 70 mcg Coffee Extract 35 mg Alpha leporic acid 50 mg huperzine-a 50 mcg    . Probiotic Product (PROBIOTIC PO) Take 1 capsule by mouth 2 (two) times daily.    . traZODone (DESYREL) 50 MG tablet Take 0.5-1 tablets (25-50 mg total) by mouth  at bedtime as needed for sleep. 30 tablet 3  . valACYclovir (VALTREX) 1000 MG tablet Take 1 tablet (1,000 mg total) by mouth 2 (two) times daily. 30 tablet 0  . metoprolol tartrate (LOPRESSOR) 50 MG tablet TAKE 1 TABLET BY MOUTH TWICE A DAY. NEEDS APPOINTMENT FOR FUTURE REFILLS 180 tablet 3   No facility-administered medications prior to visit.      Allergies:   Patient has no known allergies.   Social History   Socioeconomic History  . Marital status: Married    Spouse name: Not on file  . Number of children: Not on file  . Years of education: Not on file  . Highest education level: Not on file  Occupational History  . Not on file  Social Needs  . Financial resource strain: Not on  file  . Food insecurity:    Worry: Not on file    Inability: Not on file  . Transportation needs:    Medical: Not on file    Non-medical: Not on file  Tobacco Use  . Smoking status: Never Smoker  . Smokeless tobacco: Never Used  Substance and Sexual Activity  . Alcohol use: No  . Drug use: No  . Sexual activity: Yes  Lifestyle  . Physical activity:    Days per week: Not on file    Minutes per session: Not on file  . Stress: Not on file  Relationships  . Social connections:    Talks on phone: Not on file    Gets together: Not on file    Attends religious service: Not on file    Active member of club or organization: Not on file    Attends meetings of clubs or organizations: Not on file    Relationship status: Not on file  Other Topics Concern  . Not on file  Social History Narrative  . Not on file     Family History:  The patient's family history includes Alzheimer's disease in her mother; Aneurysm in her father; Goiter in her mother; Heart attack in her father; Hypertension in her brother and mother; Kidney disease in her brother.   ROS:   Please see the history of present illness.    ROS All other systems reviewed and are negative.   PHYSICAL EXAM:   VS:  There were no vitals  taken for this visit.   GEN: Well nourished, well developed, in no acute distress  HEENT: normal  Neck: no JVD, carotid bruits, or masses Cardiac: RRR; no murmurs, rubs, or gallops,no edema  Respiratory:  clear to auscultation bilaterally, normal work of breathing GI: soft, nontender, nondistended, + BS MS: no deformity or atrophy  Skin: warm and dry, no rash Neuro:  Alert and Oriented x 3, Strength and sensation are intact Psych: euthymic mood, full affect  Wt Readings from Last 3 Encounters:  03/04/18 256 lb (116.1 kg)  10/01/17 256 lb (116.1 kg)  09/24/17 252 lb (114.3 kg)      Studies/Labs Reviewed:   EKG:  EKG is ordered today.  The ekg ordered today demonstrates sinus bradycardia, T wave inversion in the lateral leads.  Recent Labs: 05/17/2017: TSH 2.72 03/04/2018: ALT 38; BUN 19; Creatinine, Ser 0.95; Hemoglobin 13.9; Platelets 173.0; Potassium 3.9; Sodium 138   Lipid Panel    Component Value Date/Time   CHOL 248 (H) 03/04/2018 1502   CHOL 263 (H) 08/17/2014 1134   TRIG 219.0 (H) 03/04/2018 1502   TRIG 325 (H) 08/17/2014 1134   TRIG 293 (HH) 03/29/2006 1033   HDL 38.70 (L) 03/04/2018 1502   HDL 39 (L) 08/17/2014 1134   CHOLHDL 6 03/04/2018 1502   VLDL 43.8 (H) 03/04/2018 1502   LDLCALC 153 (H) 05/09/2017 1142   LDLCALC 159 (H) 08/17/2014 1134   LDLDIRECT 177.0 03/04/2018 1502    Additional studies/ records that were reviewed today include:   Myoview 02/15/2017 Study Highlights    Nuclear stress EF: 83%. The left ventricular ejection fraction is hyperdynamic (>65%).  The study is normal.  This is a low risk study.     ASSESSMENT:    1. Coronary artery disease involving coronary bypass graft of native heart with angina pectoris ([place])   2. Dissection of thoracic aorta ([place])   3. Essential hypertension   4. Hypothyroidism, unspecified type   5. PAF (  paroxysmal atrial fibrillation) ([place])   6. Hyperlipidemia, unspecified hyperlipidemia type       PLAN:  In order of problems listed above:  1. CAD s/p CABG: Patient never formally had a cardiac catheterization, it was during her thoracic aorta dissection repair at that it was noted patient had significant plaque in the RCA and underwent SVG to RCA.  Continue aspirin and a beta-blocker  2. Thoracic aortic dissection: Status post repair.  Blood pressure well controlled on current therapy  3. Hypothyroidism: Managed by primary care provider  4. Hypertension: Blood pressure well controlled on current therapy.  5. Hyperlipidemia: Despite her history of hyperlipidemia, she is not currently on a statin due to intolerance.  I have discussed with her the possibility of PCSK9 inhibitor, she will look into the medication and we can potentially consider starting during the next office visit  6. Postop atrial fibrillation: Not on systemic anticoagulation due to lack of recurrence    Medication Adjustments/Labs and Tests Ordered: Current medicines are reviewed at length with the patient today.  Concerns regarding medicines are outlined above.  Medication changes, Labs and Tests ordered today are listed in the Patient Instructions below. Patient Instructions  Medication Instructions:  The current medical regimen is effective;  continue present plan and medications.  If you need a refill on your cardiac medications before your next appointment, please call your pharmacy.   Lab work: None ordered. If you have labs (blood work) drawn today and your tests are completely normal, you will receive your results only by: Marland Kitchen MyChart Message (if you have MyChart) OR . A paper copy in the mail If you have any lab test that is abnormal or we need to change your treatment, we will call you to review the results.  Testing/Procedures: None ordered.  Follow-Up: At [facility], you and your health needs are our priority.  As part of our continuing mission to provide you with exceptional heart care,  we have created designated Provider Care Teams.  These Care Teams include your primary Cardiologist (physician) and Advanced Practice Providers (APPs -  Physician Assistants and Nurse Practitioners) who all work together to provide you with the care you need, when you need it. You will need a follow up appointment in 3 months. You may see Dr.Croitoru or one of the following Advanced Practice Providers on your designated Care Team: Almyra Deforest, [place] . Fabian Sharp, PA-C  Any Other Special Instructions Will Be Listed Below (If Applicable). Look into the two medications that are recommended: >Repatha >Praluent   Call us if any changes in chest pains.      Hilbert Corrigan, [place]  03/21/2018 12:08 AM    [address], [place], [place]  [address] Phone: [phone]; Fax: [phone]

## 2018-03-19 ENCOUNTER — Telehealth: Payer: Self-pay | Admitting: Family Medicine

## 2018-03-19 ENCOUNTER — Other Ambulatory Visit (INDEPENDENT_AMBULATORY_CARE_PROVIDER_SITE_OTHER): Payer: Medicare Other

## 2018-03-19 DIAGNOSIS — I480 Paroxysmal atrial fibrillation: Secondary | ICD-10-CM | POA: Diagnosis not present

## 2018-03-19 NOTE — Telephone Encounter (Signed)
°  Patient returned call for lab results. Ph# [phone]   Copied from [place] [id number]. Topic: Quick Communication - Lab Results (Clinic Use ONLY) >> Mar 11, 2018  2:28 PM Ewing, Donell Sievert, CMA wrote: Called patient to inform them of 03/11/2018 lab results. When patient returns call, triage nurse may disclose results.

## 2018-03-19 NOTE — Telephone Encounter (Signed)
Charted in result notes. 

## 2018-03-19 NOTE — Telephone Encounter (Signed)
Copied from [place] [id number]. Topic: Quick Communication - Lab Results (Clinic Use ONLY) >> Mar 11, 2018  2:28 PM Ewing, Donell Sievert, CMA wrote: Called patient to inform them of 03/11/2018 lab results. When patient returns call, triage nurse may disclose results.

## 2018-03-21 ENCOUNTER — Encounter: Payer: Self-pay | Admitting: Physician Assistant

## 2018-03-21 ENCOUNTER — Other Ambulatory Visit: Payer: Self-pay | Admitting: Cardiovascular Disease

## 2018-03-21 NOTE — Telephone Encounter (Signed)
Rx request sent to pharmacy.  

## 2018-03-27 MED ORDER — ROSUVASTATIN CALCIUM 10 MG PO TABS: 10.0000 mg | ORAL_TABLET | Freq: Every day | ORAL | 3 refills | Status: DC

## 2018-03-27 NOTE — Addendum Note (Signed)
Addended by: Magdalene Molly A on: 03/27/2018 10:30 AM   Modules accepted: Orders

## 2018-04-01 ENCOUNTER — Other Ambulatory Visit: Payer: Self-pay | Admitting: Family Medicine

## 2018-04-01 DIAGNOSIS — G4700 Insomnia, unspecified: Secondary | ICD-10-CM

## 2018-05-15 ENCOUNTER — Ambulatory Visit: Payer: Medicare Other | Admitting: Internal Medicine

## 2018-05-22 ENCOUNTER — Other Ambulatory Visit: Payer: Self-pay | Admitting: Family Medicine

## 2018-05-22 DIAGNOSIS — F419 Anxiety disorder, unspecified: Secondary | ICD-10-CM

## 2018-05-26 NOTE — Telephone Encounter (Signed)
Please call pt about this since our records say therapy was changed

## 2018-05-26 NOTE — Telephone Encounter (Signed)
Last written: 01/23/18 Last ov: 03/04/18 Next ov: none Contract: none UDS: none  It looks like you d/c because of change in therapy.  Did not say what other therapy.

## 2018-05-28 NOTE — Telephone Encounter (Signed)
Patient stated that you changed medication to trazadone and it does not work for her.  She would like to change back to the lorazepam.  trazadone removed from list.

## 2018-05-28 NOTE — Telephone Encounter (Signed)
Patient was advised by pharmacy to call office regarding this medication. Patient is requesting a call back.

## 2018-05-28 NOTE — Addendum Note (Signed)
Addended by: Kem Boroughs D on: 05/28/2018 03:15 PM   Modules accepted: Orders

## 2018-05-29 ENCOUNTER — Other Ambulatory Visit: Payer: Self-pay | Admitting: Family Medicine

## 2018-05-29 DIAGNOSIS — F419 Anxiety disorder, unspecified: Secondary | ICD-10-CM

## 2018-05-29 MED ORDER — LORAZEPAM 0.5 MG PO TABS: 0.5000 mg | ORAL_TABLET | Freq: Two times a day (BID) | ORAL | 1 refills | Status: DC | PRN

## 2018-05-29 NOTE — Telephone Encounter (Signed)
Sent in

## 2018-06-02 ENCOUNTER — Encounter: Payer: Self-pay | Admitting: Family Medicine

## 2018-06-04 DIAGNOSIS — I700 Atherosclerosis of aorta: Secondary | ICD-10-CM | POA: Insufficient documentation

## 2018-06-04 DIAGNOSIS — E7800 Pure hypercholesterolemia, unspecified: Secondary | ICD-10-CM | POA: Insufficient documentation

## 2018-06-04 NOTE — Progress Notes (Deleted)
Cardiology Office Note    Date:  06/04/2018   ID:  Breanna Hernandez, DOB [birth year], MRN [id number]  PCP:  Carollee Herter, Alferd Apa, DO  Cardiologist:   Sanda Klein, MD   No chief complaint on file.   History of Present Illness:  Breanna Hernandez is a 73 y.o. 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   As before, activity is primarily limited by severe neuropathy.  She has mild bilateral ankle edema if she sits for a long period of time.  She denies angina or dyspnea at rest or with activity and is not aware of palpitations.  She has not had syncope.  She has fairly severe hypercholesterolemia but has refused to take statins consistently.  Most recent LDL cholesterol from November 2019 was 177.  PCSK9 inhibitors were discussed with her at her last appointment but she wanted to research these agents further.  She has a history of normal nuclear stress tests in 2011, 2015 and November 2018. She received a bypass to the right coronary artery due to the observation of a visible large plaque in the proximal RCA at the time of her aortic dissection. She has not had cardiac catheterization.  Most recent CT angiography of the chest was performed in June 2019 and showed stable appearance of the thoracic aortic graft, incidental note of stable small splenic artery aneurysm  Past Medical History:  Diagnosis Date  . Arthritis   . Atrial fibrillation ([place])    h/o post-op 04/2010  . Breast cancer ([place])    x 3 cancer - radiation right x2 ''00,'05, left '07 mammosite radiation   . Diverticulosis   . Dysrhythmia    a fib  . Fatty liver 01/10/07  . GERD (gastroesophageal reflux disease)     hx of  . Headache    migraines   . 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 HTN (hypertension)   . Hyperlipidemia   . Hyperplastic colon polyp   . Hypothyroidism   . IBS (irritable bowel syndrome)   . Lower extremity neuropathy    occ. affects walking and balance"foot to knees" nondiabetic  . Obesity   . Renal cyst, left   . Thyroid cancer ([place]) 11/2015   surgery Oct 2017    Past Surgical History:  Procedure Laterality Date  . BREAST LUMPECTOMY  2000   Right x2, Left x1  . CHOLECYSTECTOMY    . COLONOSCOPY W/ POLYPECTOMY    . CORONARY ARTERY BYPASS GRAFT  05/03/2010   CABG X1, RCA -Dr Roxan Hockey (with type 1 aortic dissection)  . HYSTEROSCOPY W/D&C N/A 04/13/2014   Procedure: DILATATION AND CURETTAGE /HYSTEROSCOPY Polypectomy;  Surgeon: Maeola Sarah. Landry Mellow, MD;  Location: [facility];  Service: Gynecology;  Laterality: N/A;  Possible Polypectomy  . JOINT REPLACEMENT     Right total hip 03-15-17 Dr. Ninfa Linden  . Median sternotomy, extracorporeal circulation, repair of type 1 aortic dissection with  tube graft from  sinotubular junction to take off of innominate artery, distal anastomoses under deep bypothermic circulatory arrest  05/03/2010   Dr Roxan Hockey  . ROBOTIC ASSISTED TOTAL HYSTERECTOMY WITH BILATERAL SALPINGO OOPHERECTOMY Bilateral 05/18/2014   Procedure: ROBOTIC ASSISTED TOTAL HYSTERECTOMY WITH BILATERAL SALPINGO OOPHORECTOMY SENTINAL LYMPH NODE MAPPING ;  Surgeon: Terrence Dupont  Denman George, MD;  Location: [facility];  Service: Gynecology;  Laterality: Bilateral;  . THYROIDECTOMY N/A 01/09/2016   Procedure: TOTAL THYROIDECTOMY;  Surgeon: Armandina Gemma, MD;  Location: [facility];  Service: General;  Laterality: N/A;  . TONSILLECTOMY    . TOTAL HIP ARTHROPLASTY Right 03/15/2017   Procedure: RIGHT TOTAL HIP ARTHROPLASTY ANTERIOR APPROACH;  Surgeon: Mcarthur Rossetti, MD;  Location: [facility];  Service: Orthopedics;  Laterality: Right;  . TOTAL THYROIDECTOMY  01/09/2016    Current  Medications: Outpatient Medications Prior to Visit  Medication Sig Dispense Refill  . aspirin EC 81 MG tablet Take 81 mg by mouth daily.    . benazepril (LOTENSIN) 40 MG tablet TAKE 1 TABLET BY MOUTH EVERY DAY 90 tablet 1  . CALCIUM-MAGNESIUM-VITAMIN D PO Take 1 tablet by mouth 2 (two) times daily.     Marland Kitchen desonide (DESOWEN) 0.05 % cream Apply 1 application topically 3 (three) times a week.    . hydrochlorothiazide (MICROZIDE) 12.5 MG capsule TAKE 2 CAPSULES BY MOUTH DAILY 180 capsule 3  . levothyroxine (SYNTHROID, LEVOTHROID) 175 MCG tablet Take 1 tablet (175 mcg total) by mouth daily before breakfast. 90 tablet 3  . LORazepam (ATIVAN) 0.5 MG tablet Take 1 tablet (0.5 mg total) by mouth 2 (two) times daily as needed for anxiety. 30 tablet 1  . methocarbamol (ROBAXIN) 500 MG tablet Take 1 tablet (500 mg total) by mouth 4 (four) times daily. 30 tablet 0  . metoprolol tartrate (LOPRESSOR) 50 MG tablet TAKE 1 TABLET BY MOUTH TWICE A DAY. 180 tablet 3  . mupirocin nasal ointment (BACTROBAN NASAL) 2 % Place 1 application into the nose 2 (two) times daily. Use one-half of tube in each nostril twice daily for five (5) days. After application, press sides of nose together and gently massage. 10 g 0  . mupirocin ointment (BACTROBAN) 2 % Place 1 application into the nose 2 (two) times daily. 22 g 0  . Omega-3 Fatty Acids (FISH OIL PO) Take 1 capsule by mouth 2 (two) times daily.    Marland Kitchen OVER THE COUNTER MEDICATION Take 1 tablet by mouth every morning. Product Name: EHT Mind enhancement formula Vitamin d 200 units Vitamin b 1.6 mg Vitamin b-12 10 mcg Selinum 70 mcg Coffee Extract 35 mg Alpha leporic acid 50 mg huperzine-a 50 mcg    . Probiotic Product (PROBIOTIC PO) Take 1 capsule by mouth 2 (two) times daily.    . rosuvastatin (CRESTOR) 10 MG tablet Take 1 tablet (10 mg total) by mouth daily. 30 tablet 3  . valACYclovir (VALTREX) 1000 MG tablet Take 1 tablet (1,000 mg total) by mouth 2 (two) times daily.  30 tablet 0   No facility-administered medications prior to visit.      Allergies:   Patient has no known allergies.   Social History   Socioeconomic History  . Marital status: Married    Spouse name: Not on file  . Number of children: Not on file  . Years of education: Not on file  . Highest education level: Not on file  Occupational History  . Not on file  Social Needs  . Financial resource strain: Not on file  . Food insecurity:    Worry: Not on file    Inability: Not on file  . Transportation needs:    Medical: Not on file    Non-medical: Not on file  Tobacco Use  . Smoking status: Never Smoker  . Smokeless tobacco: Never Used  Substance and Sexual Activity  .  Alcohol use: No  . Drug use: No  . Sexual activity: Yes  Lifestyle  . Physical activity:    Days per week: Not on file    Minutes per session: Not on file  . Stress: Not on file  Relationships  . Social connections:    Talks on phone: Not on file    Gets together: Not on file    Attends religious service: Not on file    Active member of club or organization: Not on file    Attends meetings of clubs or organizations: Not on file    Relationship status: Not on file  Other Topics Concern  . Not on file  Social History Narrative  . Not on file     Family History:  The patient's family history includes Alzheimer's disease in her mother; Aneurysm in her father; Goiter in her mother; Heart attack in her father; Hypertension in her brother and mother; Kidney disease in her brother.   ROS:   Please see the history of present illness.    ROS all other systems are reviewed and are negative   PHYSICAL EXAM:   VS:  There were no vitals taken for this visit.    General: Alert, oriented x3, no distress, ***obese Head: no evidence of trauma, PERRL, EOMI, no exophtalmos or lid lag, no myxedema, no xanthelasma; normal ears, nose and oropharynx Neck: normal jugular venous pulsations and no hepatojugular reflux;  brisk carotid pulses without delay and no carotid bruits Chest: clear to auscultation, no signs of consolidation by percussion or palpation, normal fremitus, symmetrical and full respiratory excursions Cardiovascular: normal position and quality of the apical impulse, regular rhythm, normal first and second heart sounds, no murmurs, rubs or gallops Abdomen: no tenderness or distention, no masses by palpation, no abnormal pulsatility or arterial bruits, normal bowel sounds, no hepatosplenomegaly Extremities: no clubbing, cyanosis or edema; 2+ radial, ulnar and brachial pulses bilaterally; 2+ right femoral, posterior tibial and dorsalis pedis pulses; 2+ left femoral, posterior tibial and dorsalis pedis pulses; no subclavian or femoral bruits Neurological: grossly nonfocal Psych: Normal mood and affect   Wt Readings from Last 3 Encounters:  03/04/18 256 lb (116.1 kg)  10/01/17 256 lb (116.1 kg)  09/24/17 252 lb (114.3 kg)      Studies/Labs Reviewed:   EKG:  EKG is ***ordered today.  The ekg ordered today demonstrates *** Recent Labs: 03/04/2018: ALT 38; BUN 19; Creatinine, Ser 0.95; Hemoglobin 13.9; Platelets 173.0; Potassium 3.9; Sodium 138   Lipid Panel    Component Value Date/Time   CHOL 248 (H) 03/04/2018 1502   CHOL 263 (H) 08/17/2014 1134   TRIG 219.0 (H) 03/04/2018 1502   TRIG 325 (H) 08/17/2014 1134   TRIG 293 (HH) 03/29/2006 1033   HDL 38.70 (L) 03/04/2018 1502   HDL 39 (L) 08/17/2014 1134   CHOLHDL 6 03/04/2018 1502   VLDL 43.8 (H) 03/04/2018 1502   LDLCALC 153 (H) 05/09/2017 1142   LDLCALC 159 (H) 08/17/2014 1134   LDLDIRECT 177.0 03/04/2018 1502    Additional studies/ records that were reviewed today include:  CT of the aorta and notes from Dr. Roxan Hockey from June 2019  ASSESSMENT:    1. Coronary artery disease involving native coronary artery of native heart without angina pectoris   2. Aortic atherosclerosis ([place])   3. Hypercholesterolemia   4. Essential  hypertension   5. History of aortic dissection   6. Paroxysmal atrial fibrillation ([facility])      PLAN:  In  order of problems listed above:  1. CAD: Never symptomatic.  Normal nuclear stress test repeatedly, most recently in late 2018. 2. Atherosclerosis in the abdominal and thoracic aorta: No significant stenosis or aneurysm by CT angiography 3. HLP: *** 4. HTN: ***controlled 5. Ao dissection: No evidence of aortic enlargement by her recent CT angiogram 6. Hx postop AFib: Without subsequent recurrence    Medication Adjustments/Labs and Tests Ordered: Current medicines are reviewed at length with the patient today.  Concerns regarding medicines are outlined above.  Medication changes, Labs and Tests ordered today are listed in the Patient Instructions below. There are no Patient Instructions on file for this visit.   Signed, Sanda Klein, MD  06/04/2018 3:24 PM    [facility] [address], [place], [place]  [address] Phone: [phone]; Fax: [phone]

## 2018-06-09 ENCOUNTER — Ambulatory Visit: Payer: Self-pay | Admitting: Family Medicine

## 2018-06-09 ENCOUNTER — Emergency Department (HOSPITAL_BASED_OUTPATIENT_CLINIC_OR_DEPARTMENT_OTHER): Payer: Medicare Other

## 2018-06-09 ENCOUNTER — Emergency Department (HOSPITAL_BASED_OUTPATIENT_CLINIC_OR_DEPARTMENT_OTHER)
Admission: EM | Admit: 2018-06-09 | Discharge: 2018-06-09 | Disposition: A | Discharge status: Home / Self Care | Payer: Medicare Other | Attending: Emergency Medicine | Admitting: Emergency Medicine

## 2018-06-09 ENCOUNTER — Other Ambulatory Visit: Payer: Self-pay

## 2018-06-09 ENCOUNTER — Encounter (HOSPITAL_BASED_OUTPATIENT_CLINIC_OR_DEPARTMENT_OTHER): Payer: Self-pay | Admitting: Emergency Medicine

## 2018-06-09 DIAGNOSIS — Z96641 Presence of right artificial hip joint: Secondary | ICD-10-CM | POA: Diagnosis not present

## 2018-06-09 DIAGNOSIS — I8001 Phlebitis and thrombophlebitis of superficial vessels of right lower extremity: Secondary | ICD-10-CM | POA: Diagnosis not present

## 2018-06-09 DIAGNOSIS — I10 Essential (primary) hypertension: Secondary | ICD-10-CM | POA: Insufficient documentation

## 2018-06-09 DIAGNOSIS — Z85850 Personal history of malignant neoplasm of thyroid: Secondary | ICD-10-CM | POA: Diagnosis not present

## 2018-06-09 DIAGNOSIS — Z79899 Other long term (current) drug therapy: Secondary | ICD-10-CM | POA: Diagnosis not present

## 2018-06-09 DIAGNOSIS — E039 Hypothyroidism, unspecified: Secondary | ICD-10-CM | POA: Diagnosis not present

## 2018-06-09 DIAGNOSIS — Z8542 Personal history of malignant neoplasm of other parts of uterus: Secondary | ICD-10-CM | POA: Insufficient documentation

## 2018-06-09 DIAGNOSIS — Z853 Personal history of malignant neoplasm of breast: Secondary | ICD-10-CM | POA: Insufficient documentation

## 2018-06-09 DIAGNOSIS — I2510 Atherosclerotic heart disease of native coronary artery without angina pectoris: Secondary | ICD-10-CM | POA: Diagnosis not present

## 2018-06-09 DIAGNOSIS — Z7982 Long term (current) use of aspirin: Secondary | ICD-10-CM | POA: Diagnosis not present

## 2018-06-09 DIAGNOSIS — Z951 Presence of aortocoronary bypass graft: Secondary | ICD-10-CM | POA: Insufficient documentation

## 2018-06-09 DIAGNOSIS — M79604 Pain in right leg: Secondary | ICD-10-CM

## 2018-06-09 DIAGNOSIS — Z8669 Personal history of other diseases of the nervous system and sense organs: Secondary | ICD-10-CM | POA: Diagnosis not present

## 2018-06-09 LAB — BASIC METABOLIC PANEL
Anion gap: 9 (ref 5–15)
BUN: 26 mg/dL — AB (ref 8–23)
CHLORIDE: 100 mmol/L (ref 98–111)
CO2: 27 mmol/L (ref 22–32)
Calcium: 9.2 mg/dL (ref 8.9–10.3)
Creatinine, Ser: 0.86 mg/dL (ref 0.44–1.00)
GFR calc Af Amer: >60 mL/min (ref >60)
GFR calc non Af Amer: >60 mL/min (ref >60)
Glucose, Bld: 106 mg/dL — ABNORMAL HIGH (ref 70–99)
POTASSIUM: 3.4 mmol/L — AB (ref 3.5–5.1)
SODIUM: 136 mmol/L (ref 135–145)

## 2018-06-09 LAB — CBC WITH DIFFERENTIAL/PLATELET
ABS IMMATURE GRANULOCYTES: 0.13 10*3/uL — AB (ref 0.00–0.07)
BASOS ABS: 0 10*3/uL (ref 0.0–0.1)
Basophils Relative: 0 %
Eosinophils Absolute: 0 10*3/uL (ref 0.0–0.5)
Eosinophils Relative: 0 %
HCT: 39.7 % (ref 36.0–46.0)
HEMOGLOBIN: 13.1 g/dL (ref 12.0–15.0)
IMMATURE GRANULOCYTES: 1 %
LYMPHS PCT: 18 %
Lymphs Abs: 1.8 10*3/uL (ref 0.7–4.0)
MCH: 33.7 pg (ref 26.0–34.0)
MCHC: 33 g/dL (ref 30.0–36.0)
MCV: 102.1 fL — ABNORMAL HIGH (ref 80.0–100.0)
MONO ABS: 1.3 10*3/uL — AB (ref 0.1–1.0)
Monocytes Relative: 13 %
NEUTROS ABS: 6.8 10*3/uL (ref 1.7–7.7)
NEUTROS PCT: 68 %
Platelets: 172 10*3/uL (ref 150–400)
RBC: 3.89 MIL/uL (ref 3.87–5.11)
RDW: 12.1 % (ref 11.5–15.5)
WBC: 10.1 10*3/uL (ref 4.0–10.5)
nRBC: 0 % (ref 0.0–0.2)

## 2018-06-09 MED ORDER — TRAMADOL HCL 50 MG PO TABS: 50.0000 mg | ORAL_TABLET | Freq: Four times a day (QID) | ORAL | 0 refills | Status: DC | PRN

## 2018-06-09 MED ORDER — CEPHALEXIN 500 MG PO CAPS: 500.0000 mg | ORAL_CAPSULE | Freq: Four times a day (QID) | ORAL | 0 refills | Status: DC

## 2018-06-09 MED ORDER — CEFTRIAXONE SODIUM 1 G IJ SOLR
1.0000 g | Freq: Once | INTRAMUSCULAR | Status: AC
  Administered 2018-06-09: 1 g via INTRAMUSCULAR
  Filled 2018-06-09: qty 10

## 2018-06-09 NOTE — ED Provider Notes (Signed)
[facility] EMERGENCY DEPARTMENT Provider Note   CSN: [id number] Arrival date & time: 06/09/18  1338    History   Chief Complaint Chief Complaint  Patient presents with  . Foot Swelling    HPI Breanna Hernandez is a 73 y.o. female.     Patient is a 73 year old female with history of atrial fibrillation, breast cancer, hypertension, hyperlipidemia, and peripheral neuropathy.  She presents today with complaints of a "pins-and-needles" sensation to the bottom of her right foot.  This is worsened over the past several days to the point that it is painful to walk.  She also reports increased swelling of the right leg.  She denies any chest pain or difficulty breathing.  She denies any fevers or chills.  There is nothing that makes the pain better, but it is aggravated with walking.  The history is provided by the patient.    Past Medical History:  Diagnosis Date  . Arthritis   . Atrial fibrillation ([place])    h/o post-op 04/2010  . Breast cancer ([place])    x 3 cancer - radiation right x2 ''00,'05, left '07 mammosite radiation   . Diverticulosis   . Dysrhythmia    a fib  . Fatty liver 01/10/07  . GERD (gastroesophageal reflux disease)    hx of  . Headache    migraines   . 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 HTN (hypertension)   . Hyperlipidemia   . Hyperplastic colon polyp   . Hypothyroidism   . IBS (irritable bowel syndrome)   . Lower extremity neuropathy    occ. affects walking and balance"foot to knees" nondiabetic  . Obesity   . Renal cyst, left   . Thyroid cancer ([place]) 11/2015   surgery Oct 2017    Patient Active Problem List   Diagnosis Date Noted  . Aortic atherosclerosis ([place]) 06/04/2018  . Hypercholesterolemia 06/04/2018  . Status post total replacement of right hip 03/15/2017  . History of malignant neoplasm of thyroid 02/07/2017  . Pre-operative cardiovascular examination 02/07/2017  . Abnormal EKG 02/07/2017  . Unilateral primary  osteoarthritis, right hip 12/11/2016  . Pain of right hip joint 12/11/2016  . Bilateral cataracts 08/10/2016  . Numerous moles 05/10/2016  . CAD (coronary artery disease) 11/16/2015  . Papillary thyroid carcinoma ([place]) 11/16/2015  . Quality of life palliative care encounter 11/03/2015  . Elevated liver enzymes 10/06/2014  . Endometrial ca ([place]) 06/24/2014  . History of endometrial cancer 04/26/2014  . Postmenopausal bleeding 04/13/2014  . Neuropathy 10/02/2013  . Exertional chest pain 08/09/2013  . Atrial fibrillation, postoperative only 06/25/2012  . History of aortic dissection   . Dyslipidemia 05/27/2009  . METABOLIC SYNDROME X 82/95/6213  . NONSPEC ELEVATION OF LEVELS OF TRANSAMINASE/LDH 05/27/2009  . Postoperative hypothyroidism 03/12/2008  . History of breast cancer 03/12/2008  . COLONIC POLYPS, HX OF 03/12/2008  . Essential hypertension 05/09/2006  . Esophageal reflux 05/09/2006    Past Surgical History:  Procedure Laterality Date  . BREAST LUMPECTOMY  2000   Right x2, Left x1  . CHOLECYSTECTOMY    . COLONOSCOPY W/ POLYPECTOMY    . CORONARY ARTERY BYPASS GRAFT  05/03/2010   CABG X1, RCA -Dr Roxan Hockey (with type 1 aortic dissection)  . HYSTEROSCOPY W/D&C N/A 04/13/2014   Procedure: DILATATION AND CURETTAGE /HYSTEROSCOPY Polypectomy;  Surgeon: Maeola Sarah. Landry Mellow, MD;  Location: [facility];  Service: Gynecology;  Laterality: N/A;  Possible Polypectomy  . JOINT REPLACEMENT  Right total hip 03-15-17 Dr. Ninfa Linden  . Median sternotomy, extracorporeal circulation, repair of type 1 aortic dissection with  tube graft from  sinotubular junction to take off of innominate artery, distal anastomoses under deep bypothermic circulatory arrest  05/03/2010   Dr Roxan Hockey  . ROBOTIC ASSISTED TOTAL HYSTERECTOMY WITH BILATERAL SALPINGO OOPHERECTOMY Bilateral 05/18/2014   Procedure: ROBOTIC ASSISTED TOTAL HYSTERECTOMY WITH BILATERAL SALPINGO OOPHORECTOMY SENTINAL LYMPH NODE MAPPING ;  Surgeon: Everitt Amber, MD;  Location: [facility];  Service: Gynecology;  Laterality: Bilateral;  . THYROIDECTOMY N/A 01/09/2016   Procedure: TOTAL THYROIDECTOMY;  Surgeon: Armandina Gemma, MD;  Location: [facility];  Service: General;  Laterality: N/A;  . TONSILLECTOMY    . TOTAL HIP ARTHROPLASTY Right 03/15/2017   Procedure: RIGHT TOTAL HIP ARTHROPLASTY ANTERIOR APPROACH;  Surgeon: Mcarthur Rossetti, MD;  Location: [facility];  Service: Orthopedics;  Laterality: Right;  . TOTAL THYROIDECTOMY  01/09/2016     OB History   No obstetric history on file.      Home Medications    Prior to Admission medications   Medication Sig Start Date End Date Taking? Authorizing Provider  aspirin EC 81 MG tablet Take 81 mg by mouth daily.    [provider]  benazepril (LOTENSIN) 40 MG tablet TAKE 1 TABLET BY MOUTH EVERY DAY 03/21/18   Croitoru, Mihai, MD  CALCIUM-MAGNESIUM-VITAMIN D PO Take 1 tablet by mouth 2 (two) times daily.     [provider]  desonide (DESOWEN) 0.05 % cream Apply 1 application topically 3 (three) times a week.    [provider]  hydrochlorothiazide (MICROZIDE) 12.5 MG capsule TAKE 2 CAPSULES BY MOUTH DAILY 07/09/17   Carollee Herter, Alferd Apa, DO  levothyroxine (SYNTHROID, LEVOTHROID) 175 MCG tablet Take 1 tablet (175 mcg total) by mouth daily before breakfast. 05/17/17   Philemon Kingdom, MD  LORazepam (ATIVAN) 0.5 MG tablet Take 1 tablet (0.5 mg total) by mouth 2 (two) times daily as needed for anxiety. 05/29/18   Ann Held, DO  methocarbamol (ROBAXIN) 500 MG tablet Take 1 tablet (500 mg total) by mouth 4 (four) times daily. 03/04/18   Roma Schanz R, DO  metoprolol tartrate (LOPRESSOR) 50 MG tablet TAKE 1 TABLET BY MOUTH TWICE A DAY. 03/18/18   Almyra Deforest, PA  mupirocin nasal ointment (BACTROBAN NASAL) 2 % Place 1 application into the nose 2 (two) times daily. Use one-half of tube in each nostril twice daily for five (5) days. After application, press sides of nose  together and gently massage. 06/14/17   Ann Held, DO  mupirocin ointment (BACTROBAN) 2 % Place 1 application into the nose 2 (two) times daily. 07/11/17   Ann Held, DO  Omega-3 Fatty Acids (FISH OIL PO) Take 1 capsule by mouth 2 (two) times daily.    [provider]  OVER THE COUNTER MEDICATION Take 1 tablet by mouth every morning. Product Name: EHT Mind enhancement formula Vitamin d 200 units Vitamin b 1.6 mg Vitamin b-12 10 mcg Selinum 70 mcg Coffee Extract 35 mg Alpha leporic acid 50 mg huperzine-a 50 mcg    [provider]  Probiotic Product (PROBIOTIC PO) Take 1 capsule by mouth 2 (two) times daily.    [provider]  rosuvastatin (CRESTOR) 10 MG tablet Take 1 tablet (10 mg total) by mouth daily. 03/27/18   Ann Held, DO  valACYclovir (VALTREX) 1000 MG tablet Take 1 tablet (1,000 mg total) by mouth 2 (two)  times daily. 10/01/17   Roma Schanz R, DO  amLODipine-benazepril (LOTREL) 5-20 MG per capsule Take 1 capsule by mouth daily.    07/03/11  [provider]  escitalopram (LEXAPRO) 10 MG tablet Take 10 mg by mouth daily.    07/03/11  [provider]  lisinopril (PRINIVIL,ZESTRIL) 20 MG tablet Take 20 mg by mouth daily.    07/03/11  [provider]    Family History Family History  Problem Relation Age of Onset  . Aneurysm Father        CNS  . Heart attack Father        x2  . Alzheimer's disease Mother   . Hypertension Mother   . Goiter Mother   . Hypertension Brother   . Kidney disease Brother   . Colon cancer Neg Hx     Social History Social History   Tobacco Use  . Smoking status: Never Smoker  . Smokeless tobacco: Never Used  Substance Use Topics  . Alcohol use: No  . Drug use: No     Allergies   Patient has no known allergies.   Review of Systems Review of Systems  All other systems reviewed and are negative.    Physical Exam Updated Vital Signs BP (!) 134/59  (BP Location: Right Arm)   Pulse (!) 55   Temp 97.9 F (36.6 C) (Oral)   Resp 18   Ht 5\' 4"  (1.626 m)   Wt 108.9 kg   SpO2 96%   BMI 41.20 kg/m   Physical Exam Vitals signs and nursing note reviewed.  Constitutional:      General: She is not in acute distress.    Appearance: She is well-developed. She is not diaphoretic.  HENT:     Head: Normocephalic and atraumatic.  Neck:     Musculoskeletal: Normal range of motion and neck supple.  Cardiovascular:     Rate and Rhythm: Normal rate and regular rhythm.     Heart sounds: No murmur. No friction rub. No gallop.   Pulmonary:     Effort: Pulmonary effort is normal. No respiratory distress.     Breath sounds: Normal breath sounds. No wheezing.  Abdominal:     General: Bowel sounds are normal. There is no distension.     Palpations: Abdomen is soft.     Tenderness: There is no abdominal tenderness.  Musculoskeletal: Normal range of motion.        General: Swelling and tenderness present.     Right lower leg: Edema present.     Comments: There is 2+ pitting edema of the right ankle and foot in a stocking distribution.  There is minimal edema of the left lower extremity.  DP pulses are palpable.  There is mild erythema to this area, however no warmth.  There is no calf tenderness.  Homans sign is absent bilaterally.  Skin:    General: Skin is warm and dry.  Neurological:     Mental Status: She is alert and oriented to person, place, and time.      ED Treatments / Results  Labs (all labs ordered are listed, but only abnormal results are displayed) Labs Reviewed - No data to display  EKG None  Radiology No results found.  Procedures Procedures (including critical care time)  Medications Ordered in ED Medications - No data to display   Initial Impression / Assessment and Plan / ED Course  I have reviewed the triage vital signs and the nursing notes.  Pertinent labs & imaging results that were available during my  care of the patient were reviewed by me and considered in my medical decision making (see chart for details).  Patient presents here with complaints of pain and swelling to her right leg.  She has a history of neuropathy, however this feels different.  Her physical examination reveals an edematous leg with mild erythema.  My concern was for DVT initially.  An ultrasound did not show a deep vein thrombosis, but did show superficial thrombophlebitis.  Patient will be treated with Keflex after receiving Rocephin here in the ER for what appears to be a cellulitis.  She is to return as needed for any problems.  Final Clinical Impressions(s) / ED Diagnoses   Final diagnoses:  None    ED Discharge Orders    None       Veryl Speak, MD 06/09/18 [phone]

## 2018-06-09 NOTE — ED Triage Notes (Signed)
Reports right foot swelling with pain.  History of neuropathy but states this feels different.  Denies injury.

## 2018-06-09 NOTE — Telephone Encounter (Signed)
Pt called in c/o her right foot being bluish purple in color on top of her foot into her toes.   She has neuropathy so her feet are numb.   She has had her socks on since Friday and just took them off this morning and noticed the discoloration.  See triage notes.  I have referred her to the ED.   She is going to the [facility] ED on [address] now.  I have sent a note to Dr. Carollee Herter  So she is aware.    Reason for Disposition . Patient sounds very sick or weak to the triager  Answer Assessment - Initial Assessment Questions 1. ONSET: "When did the pain start?"      My right foot is discolored and swollen on top.   I've been using a walker since Friday to walk.   I'm having shooting stabbing pains.   The bottom of my feet are so sore I can't hardly walk.     My toes and top of right foot is bluish in color and swollen.     I pulled my socks off today and I noticed it.   I've not removed my socks since Friday. 2. LOCATION: "Where is the pain located?"      Mostly in the bottom and on the top too.   Worse with ambulating. 3. PAIN: "How bad is the pain?"    (Scale 1-10; or mild, moderate, severe)   -  MILD (1-3): doesn't interfere with normal activities    -  MODERATE (4-7): interferes with normal activities (e.g., work or school) or awakens from sleep, limping    -  SEVERE (8-10): excruciating pain, unable to do any normal activities, unable to walk     7 or 8 walking. 4. WORK OR EXERCISE: "Has there been any recent work or exercise that involved this part of the body?"      No.     On Feb 4th I stepped on a slip spot in my laundry room and my right leg went out and I pulled the hamstring muscle.   I was sitting around for 3 wks letting that heal.   I just started walking last week.     5. CAUSE: "What do you think is causing the foot pain?"     I have neuropathy.   Maybe the slip in Feb.   Not diabetic.   Had  Neuropathy since early  1990's.    6. OTHER SYMPTOMS: "Do  you have any other symptoms?" (e.g., leg pain, rash, fever, numbness)     Been numb for a long time.   Top of foot up to ankle is a bluish purple color and my toes.   I've had discoloration from my ankle up but not on my foot. 7. PREGNANCY: "Is there any chance you are pregnant?" "When was your last menstrual period?"     Not asked due to age  Protocols used: FOOT PAIN-A-AH

## 2018-06-09 NOTE — Discharge Instructions (Addendum)
Keflex as prescribed.  Tramadol as prescribed as needed for pain.  Keep your leg elevated as much as possible for the next several days.  Follow-up with your primary doctor if not improving in the next 3 days, and return to the ER if symptoms significantly worsen or change.

## 2018-06-10 ENCOUNTER — Ambulatory Visit: Payer: Medicare Other | Admitting: Cardiovascular Disease

## 2018-06-20 ENCOUNTER — Telehealth: Payer: Self-pay | Admitting: Cardiovascular Disease

## 2018-06-20 NOTE — Telephone Encounter (Signed)
New Message   Patient c/o Palpitations:  High priority if patient c/o lightheadedness, shortness of breath, or chest pain  1) How long have you had palpitations/irregular HR/ Afib? Are you having the symptoms now? Patient states it happen 4 different times over the last 2 weeks. Patient not having symptoms right now says she's fine during the day.  2) Are you currently experiencing lightheadedness, SOB or CP? No   3) Do you have a history of afib (atrial fibrillation) or irregular heart rhythm? Patient has Aorta Dissection in 2012 and fast heart beat after surgery but she's been fine for several years now it feels like it's doing flips.  4) Have you checked your BP or HR? (document readings if available): Hard to check heart rate when she's having the issues but after she did and her HR was 80 but patient has checked BP  5) Are you experiencing any other symptoms? No

## 2018-06-20 NOTE — Telephone Encounter (Signed)
Spoke with patient and she has had several episodes over the last 2 weeks of her hear feeling like it "flip flopping and all over the place". These episodes are only during the night. She has history of Afib post op several years ago. She stated this did feel different. Did schedule her appointment next week with Arnold Long DNP. Discussed with Janan Ridge PA no other changes.

## 2018-06-24 ENCOUNTER — Ambulatory Visit: Payer: Medicare Other | Admitting: Adult Health

## 2018-06-24 NOTE — Progress Notes (Deleted)
Cardiology Office Note   Date:  06/24/2018   ID:  Breanna Hernandez, DOB 12/12/1945, MRN [id number]  PCP:  Ann Held, DO  Cardiologist: Dr.  Sallyanne Kuster  No chief complaint on file.    History of Present Illness: Breanna Hernandez is a 73 y.o. female who presents for ongoing assessment and management of CAD with hx of SVG to RCA hypertension, with other history of hypothyroidism, breast cancer with radiation therapy.   She was last seen by Almyra Deforest, PA who noted that a repeat stress myoview on 02/15/2017 was now risk and without ischemia or infarction.  Last CT angiogram of the chest obtained on 09/24/2017 showed stable appearance of the thoracic aortic graft and a negative thoracic aorta, no evidence of thoracic aortic aneurysm or dissection, stable small splenic artery aneurysm.  Patient was last seen by Dr. Roxan Hockey in June 2019 who recommended annual follow-up.  She did complain of a burning sensation in her chest lasting about a minute twice a week. No new testing was planned at that time. She did have a discussion concerning PCSK9 inhibition. She wanted to think about it. Last lipid check on 02/2018 revealed TC 248, TG 219, LDL could not be calculated.She is to have a recheck of labs ordered today.     Past Medical History:  Diagnosis Date  . Arthritis   . Atrial fibrillation ([place])    h/o post-op 04/2010  . Breast cancer ([place])    x 3 cancer - radiation right x2 ''00,'05, left '07 mammosite radiation   . Diverticulosis   . Dysrhythmia    a fib  . Fatty liver 01/10/07  . GERD (gastroesophageal reflux disease)    hx of  . Headache    migraines   . 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 HTN (hypertension)   . Hyperlipidemia   . Hyperplastic colon polyp   . Hypothyroidism   . IBS (irritable bowel syndrome)   . Lower extremity neuropathy    occ. affects walking and balance"foot to knees" nondiabetic  . Obesity   . Renal cyst, left   . Thyroid cancer ([place])  11/2015   surgery Oct 2017    Past Surgical History:  Procedure Laterality Date  . BREAST LUMPECTOMY  2000   Right x2, Left x1  . CHOLECYSTECTOMY    . COLONOSCOPY W/ POLYPECTOMY    . CORONARY ARTERY BYPASS GRAFT  05/03/2010   CABG X1, RCA -Dr Roxan Hockey (with type 1 aortic dissection)  . HYSTEROSCOPY W/D&C N/A 04/13/2014   Procedure: DILATATION AND CURETTAGE /HYSTEROSCOPY Polypectomy;  Surgeon: Maeola Sarah. Landry Mellow, MD;  Location: [facility];  Service: Gynecology;  Laterality: N/A;  Possible Polypectomy  . JOINT REPLACEMENT     Right total hip 03-15-17 Dr. Ninfa Linden  . Median sternotomy, extracorporeal circulation, repair of type 1 aortic dissection with  tube graft from  sinotubular junction to take off of innominate artery, distal anastomoses under deep bypothermic circulatory arrest  05/03/2010   Dr Roxan Hockey  . ROBOTIC ASSISTED TOTAL HYSTERECTOMY WITH BILATERAL SALPINGO OOPHERECTOMY Bilateral 05/18/2014   Procedure: ROBOTIC ASSISTED TOTAL HYSTERECTOMY WITH BILATERAL SALPINGO OOPHORECTOMY SENTINAL LYMPH NODE MAPPING ;  Surgeon: Everitt Amber, MD;  Location: [facility];  Service: Gynecology;  Laterality: Bilateral;  . THYROIDECTOMY N/A 01/09/2016   Procedure: TOTAL THYROIDECTOMY;  Surgeon: Armandina Gemma, MD;  Location: [place];  Service: General;  Laterality: N/A;  . TONSILLECTOMY    . TOTAL HIP ARTHROPLASTY Right 03/15/2017   Procedure: RIGHT  TOTAL HIP ARTHROPLASTY ANTERIOR APPROACH;  Surgeon: Mcarthur Rossetti, MD;  Location: [facility];  Service: Orthopedics;  Laterality: Right;  . TOTAL THYROIDECTOMY  01/09/2016     Current Outpatient Medications  Medication Sig Dispense Refill  . aspirin EC 81 MG tablet Take 81 mg by mouth daily.    . benazepril (LOTENSIN) 40 MG tablet TAKE 1 TABLET BY MOUTH EVERY DAY 90 tablet 1  . CALCIUM-MAGNESIUM-VITAMIN D PO Take 1 tablet by mouth 2 (two) times daily.     . cephALEXin (KEFLEX) 500 MG capsule Take 1 capsule (500 mg total) by mouth 4 (four) times daily. 28 capsule 0   . desonide (DESOWEN) 0.05 % cream Apply 1 application topically 3 (three) times a week.    . hydrochlorothiazide (MICROZIDE) 12.5 MG capsule TAKE 2 CAPSULES BY MOUTH DAILY 180 capsule 3  . levothyroxine (SYNTHROID, LEVOTHROID) 175 MCG tablet Take 1 tablet (175 mcg total) by mouth daily before breakfast. 90 tablet 3  . LORazepam (ATIVAN) 0.5 MG tablet Take 1 tablet (0.5 mg total) by mouth 2 (two) times daily as needed for anxiety. 30 tablet 1  . methocarbamol (ROBAXIN) 500 MG tablet Take 1 tablet (500 mg total) by mouth 4 (four) times daily. 30 tablet 0  . metoprolol tartrate (LOPRESSOR) 50 MG tablet TAKE 1 TABLET BY MOUTH TWICE A DAY. 180 tablet 3  . mupirocin nasal ointment (BACTROBAN NASAL) 2 % Place 1 application into the nose 2 (two) times daily. Use one-half of tube in each nostril twice daily for five (5) days. After application, press sides of nose together and gently massage. 10 g 0  . mupirocin ointment (BACTROBAN) 2 % Place 1 application into the nose 2 (two) times daily. 22 g 0  . Omega-3 Fatty Acids (FISH OIL PO) Take 1 capsule by mouth 2 (two) times daily.    Marland Kitchen OVER THE COUNTER MEDICATION Take 1 tablet by mouth every morning. Product Name: EHT Mind enhancement formula Vitamin d 200 units Vitamin b 1.6 mg Vitamin b-12 10 mcg Selinum 70 mcg Coffee Extract 35 mg Alpha leporic acid 50 mg huperzine-a 50 mcg    . Probiotic Product (PROBIOTIC PO) Take 1 capsule by mouth 2 (two) times daily.    . rosuvastatin (CRESTOR) 10 MG tablet Take 1 tablet (10 mg total) by mouth daily. 30 tablet 3  . traMADol (ULTRAM) 50 MG tablet Take 1 tablet (50 mg total) by mouth every 6 (six) hours as needed. 15 tablet 0  . valACYclovir (VALTREX) 1000 MG tablet Take 1 tablet (1,000 mg total) by mouth 2 (two) times daily. 30 tablet 0   No current facility-administered medications for this visit.     Allergies:   Patient has no known allergies.    Social History:  The patient  reports that she has never  smoked. She has never used smokeless tobacco. She reports that she does not drink alcohol or use drugs.   Family History:  The patient's family history includes Alzheimer's disease in her mother; Aneurysm in her father; Goiter in her mother; Heart attack in her father; Hypertension in her brother and mother; Kidney disease in her brother.    ROS: All other systems are reviewed and negative. Unless otherwise mentioned in H&P    PHYSICAL EXAM: VS:  There were no vitals taken for this visit. , BMI There is no height or weight on file to calculate BMI. GEN: Well nourished, well developed, in no acute distress HEENT: normal Neck: no JVD, carotid  bruits, or masses Cardiac: ***RRR; no murmurs, rubs, or gallops,no edema  Respiratory:  Clear to auscultation bilaterally, normal work of breathing GI: soft, nontender, nondistended, + BS MS: no deformity or atrophy Skin: warm and dry, no rash Neuro:  Strength and sensation are intact Psych: euthymic mood, full affect   EKG:  EKG {ACTION; IS/IS ZRA:[id number]} ordered today. The ekg ordered today demonstrates ***   Recent Labs: 03/04/2018: ALT 38 06/09/2018: BUN 26; Creatinine, Ser 0.86; Hemoglobin 13.1; Platelets 172; Potassium 3.4; Sodium 136    Lipid Panel    Component Value Date/Time   CHOL 248 (H) 03/04/2018 1502   CHOL 263 (H) 08/17/2014 1134   TRIG 219.0 (H) 03/04/2018 1502   TRIG 325 (H) 08/17/2014 1134   TRIG 293 (HH) 03/29/2006 1033   HDL 38.70 (L) 03/04/2018 1502   HDL 39 (L) 08/17/2014 1134   CHOLHDL 6 03/04/2018 1502   VLDL 43.8 (H) 03/04/2018 1502   LDLCALC 153 (H) 05/09/2017 1142   LDLCALC 159 (H) 08/17/2014 1134   LDLDIRECT 177.0 03/04/2018 1502      Wt Readings from Last 3 Encounters:  06/09/18 240 lb (108.9 kg)  03/04/18 256 lb (116.1 kg)  10/01/17 256 lb (116.1 kg)      Other studies Reviewed: Additional studies/ records that were reviewed today include: ***. Review of the above records demonstrates: ***    ASSESSMENT AND PLAN:  1.  ***   Current medicines are reviewed at length with the patient today.    Labs/ tests ordered today include: *** Phill Myron. [place] Pugh, ANP, [facility]   06/24/2018 9:37 AM    [facility] [place] [address] Office [phone] Fax [phone]

## 2018-07-08 ENCOUNTER — Other Ambulatory Visit: Payer: Self-pay | Admitting: Thoracic Surgery (Cardiothoracic Vascular Surgery)

## 2018-07-08 DIAGNOSIS — Z8679 Personal history of other diseases of the circulatory system: Secondary | ICD-10-CM

## 2018-07-12 ENCOUNTER — Encounter: Payer: Self-pay | Admitting: Internal Medicine

## 2018-07-15 ENCOUNTER — Other Ambulatory Visit: Payer: Self-pay | Admitting: Internal Medicine

## 2018-07-15 DIAGNOSIS — E890 Postprocedural hypothyroidism: Secondary | ICD-10-CM

## 2018-07-15 DIAGNOSIS — C73 Malignant neoplasm of thyroid gland: Secondary | ICD-10-CM

## 2018-07-17 ENCOUNTER — Other Ambulatory Visit: Payer: Self-pay | Admitting: Family Medicine

## 2018-07-17 DIAGNOSIS — I10 Essential (primary) hypertension: Secondary | ICD-10-CM

## 2018-07-23 ENCOUNTER — Other Ambulatory Visit: Payer: Self-pay | Admitting: Family Medicine

## 2018-07-23 DIAGNOSIS — F419 Anxiety disorder, unspecified: Secondary | ICD-10-CM

## 2018-07-25 ENCOUNTER — Other Ambulatory Visit: Payer: Self-pay

## 2018-07-25 DIAGNOSIS — C73 Malignant neoplasm of thyroid gland: Secondary | ICD-10-CM

## 2018-07-25 DIAGNOSIS — E890 Postprocedural hypothyroidism: Secondary | ICD-10-CM

## 2018-07-25 NOTE — Telephone Encounter (Signed)
Last written: 05/29/18 Last ov: 02/23/19 Next ov: none Contract: due UDS: due  Will call to make follow up appointment.

## 2018-07-26 LAB — TGAB+THYROGLOBULIN IMA OR LCMS: Thyroglobulin Antibody: <1 IU/mL (ref 0.0–0.9)

## 2018-07-26 LAB — TSH: TSH: 1.01 u[IU]/mL (ref 0.450–4.500)

## 2018-07-26 LAB — THYROGLOBULIN BY IMA: Thyroglobulin by IMA: <0.1 ng/mL — ABNORMAL LOW (ref 1.5–38.5)

## 2018-07-26 LAB — T4, FREE: Free T4: 1.48 ng/dL (ref 0.82–1.77)

## 2018-07-28 ENCOUNTER — Encounter: Payer: Self-pay | Admitting: Internal Medicine

## 2018-07-29 ENCOUNTER — Telehealth: Payer: Self-pay | Admitting: Cardiovascular Disease

## 2018-07-29 ENCOUNTER — Telehealth: Payer: Self-pay

## 2018-07-29 NOTE — Telephone Encounter (Signed)
Virtual Visit Pre-Appointment Phone Call  "Breanna Hernandez, I am calling you today to discuss your upcoming appointment. We are currently trying to limit exposure to the virus that causes COVID-19 by seeing patients at home rather than in the office."  1. "What is the BEST phone number to call the day of the visit?" - include this in appointment notes-CALL CELL PHONE  2. "Do you have or have access to (through a family member/friend) a smartphone with video capability that we can use for your visit?" a. If yes - list this number in appt notes as "cell" (if different from BEST phone #) and list the appointment type as a VIDEO visit in appointment notes b. If no - list the appointment type as a PHONE visit in appointment notes  3. Confirm consent - "In the setting of the current Covid19 crisis, you are scheduled for a VIDEO visit with your provider on 07/30/2018 at 1:20pm.  Just as we do with many in-office visits, in order for you to participate in this visit, we must obtain consent.  If you'd like, I can send this to your mychart (if signed up) or email for you to review.  Otherwise, I can obtain your verbal consent now.  All virtual visits are billed to your insurance company just like a normal visit would be.  By agreeing to a virtual visit, we'd like you to understand that the technology does not allow for your provider to perform an examination, and thus may limit your provider's ability to fully assess your condition. If your provider identifies any concerns that need to be evaluated in person, we will make arrangements to do so.  Finally, though the technology is pretty good, we cannot assure that it will always work on either your or our end, and in the setting of a video visit, we may have to convert it to a phone-only visit.  In either situation, we cannot ensure that we have a secure connection.  Are you willing to proceed?" STAFF: Did the patient verbally acknowledge consent to telehealth visit?  Document YES/NO here: YES  4. Advise patient to be prepared - "Two hours prior to your appointment, go ahead and check your blood pressure, pulse, oxygen saturation, and your weight (if you have the equipment to check those) and write them all down. When your visit starts, your provider will ask you for this information. If you have an Apple Watch or Kardia device, please plan to have heart rate information ready on the day of your appointment. Please have a pen and paper handy nearby the day of the visit as well."  5. Give patient instructions for MyChart download to smartphone OR Doximity/Doxy.me as below if video visit (depending on what platform provider is using)  6. Inform patient they will receive a phone call 15 minutes prior to their appointment time (may be from unknown caller ID) so they should be prepared to answer    TELEPHONE CALL NOTE  Breanna Hernandez has been deemed a candidate for a follow-up tele-health visit to limit community exposure during the Covid-19 pandemic. I spoke with the patient via phone to ensure availability of phone/video source, confirm preferred email & phone number, and discuss instructions and expectations.  I reminded Breanna Hernandez to be prepared with any vital sign and/or heart rhythm information that could potentially be obtained via home monitoring, at the time of her visit. I reminded Breanna Hernandez to expect a phone call prior to her visit.  Harold Hedge, [place] 07/29/2018 11:09 AM   INSTRUCTIONS FOR DOWNLOADING THE MYCHART APP TO SMARTPHONE  - The patient must first make sure to have activated MyChart and know their login information - If Apple, go to [employer] and type in MyChart in the search bar and download the app. If Android, ask patient to go to [employer] and type in [place] in the search bar and download the app. The app is free but as with any other app downloads, their phone may require them to verify saved payment information or  Apple/Android password.  - The patient will need to then log into the app with their MyChart username and password, and select [facility] as their healthcare provider to link the account. When it is time for your visit, go to the MyChart app, find appointments, and click Begin Video Visit. Be sure to Select Allow for your device to access the Microphone and Camera for your visit. You will then be connected, and your provider will be with you shortly.  **If they have any issues connecting, or need assistance please contact MyChart service desk [phone])**  **If using a computer, in order to ensure the best quality for their visit they will need to use either of the following Internet Browsers: [employer], or Google Chrome**  IF USING DOXIMITY or DOXY.ME - The patient will receive a link just prior to their visit by text.     FULL LENGTH CONSENT FOR TELE-HEALTH VISIT   I hereby voluntarily request, consent and authorize [facility] and its employed or contracted physicians, physician assistants, nurse practitioners or other licensed health care professionals (the Practitioner), to provide me with telemedicine health care services (the "Services") as deemed necessary by the treating Practitioner. I acknowledge and consent to receive the Services by the Practitioner via telemedicine. I understand that the telemedicine visit will involve communicating with the Practitioner through live audiovisual communication technology and the disclosure of certain medical information by electronic transmission. I acknowledge that I have been given the opportunity to request an in-person assessment or other available alternative prior to the telemedicine visit and am voluntarily participating in the telemedicine visit.  I understand that I have the right to withhold or withdraw my consent to the use of telemedicine in the course of my care at any time, without affecting my right to future care  or treatment, and that the Practitioner or I may terminate the telemedicine visit at any time. I understand that I have the right to inspect all information obtained and/or recorded in the course of the telemedicine visit and may receive copies of available information for a reasonable fee.  I understand that some of the potential risks of receiving the Services via telemedicine include:  Marland Kitchen Delay or interruption in medical evaluation due to technological equipment failure or disruption; . Information transmitted may not be sufficient (e.g. poor resolution of images) to allow for appropriate medical decision making by the Practitioner; and/or  . In rare instances, security protocols could fail, causing a breach of personal health information.  Furthermore, I acknowledge that it is my responsibility to provide information about my medical history, conditions and care that is complete and accurate to the best of my ability. I acknowledge that Practitioner's advice, recommendations, and/or decision may be based on factors not within their control, such as incomplete or inaccurate data provided by me or distortions of diagnostic images or specimens that may result from electronic transmissions. I understand that  the practice of medicine is not an exact science and that Practitioner makes no warranties or guarantees regarding treatment outcomes. I acknowledge that I will receive a copy of this consent concurrently upon execution via email to the email address I last provided but may also request a printed copy by calling the office of [facility].    I understand that my insurance will be billed for this visit.   I have read or had this consent read to me. . I understand the contents of this consent, which adequately explains the benefits and risks of the Services being provided via telemedicine.  . I have been provided ample opportunity to ask questions regarding this consent and the Services and have had  my questions answered to my satisfaction. . I give my informed consent for the services to be provided through the use of telemedicine in my medical care  By participating in this telemedicine visit I agree to the above.

## 2018-07-29 NOTE — Telephone Encounter (Signed)
Mychart, smartphone, Pre reg complete 07/29/18 AF

## 2018-07-30 ENCOUNTER — Telehealth (INDEPENDENT_AMBULATORY_CARE_PROVIDER_SITE_OTHER): Payer: Medicare Other | Admitting: Cardiovascular Disease

## 2018-07-30 ENCOUNTER — Encounter: Payer: Self-pay | Admitting: Cardiovascular Disease

## 2018-07-30 VITALS — BP 149/59 | HR 50 | Ht 64.0 in | Wt 242.6 lb

## 2018-07-30 DIAGNOSIS — E8881 Metabolic syndrome: Secondary | ICD-10-CM

## 2018-07-30 DIAGNOSIS — I2510 Atherosclerotic heart disease of native coronary artery without angina pectoris: Secondary | ICD-10-CM

## 2018-07-30 DIAGNOSIS — I700 Atherosclerosis of aorta: Secondary | ICD-10-CM

## 2018-07-30 DIAGNOSIS — Z8679 Personal history of other diseases of the circulatory system: Secondary | ICD-10-CM | POA: Diagnosis not present

## 2018-07-30 DIAGNOSIS — I10 Essential (primary) hypertension: Secondary | ICD-10-CM | POA: Diagnosis not present

## 2018-07-30 DIAGNOSIS — I480 Paroxysmal atrial fibrillation: Secondary | ICD-10-CM

## 2018-07-30 MED ORDER — MAGNESIUM OXIDE 400 MG PO CAPS: ORAL_CAPSULE | ORAL | 6 refills | Status: AC

## 2018-07-30 NOTE — Patient Instructions (Signed)
Increase intake of potassium rich foods such as fruits and vegetables. Start magnesium oxide 400 mg once a day, available over-the-counter Please check a repeat basic metabolic panel and lipid profile in the next 3 months, can be done via PCP Follow-up 12 months

## 2018-07-31 NOTE — Progress Notes (Signed)
Virtual Visit via Video Note   This visit type was conducted due to national recommendations for restrictions regarding the COVID-19 Pandemic (e.g. social distancing) in an effort to limit this patient's exposure and mitigate transmission in our community.  Due to her co-morbid illnesses, this patient is at least at moderate risk for complications without adequate follow up.  This format is felt to be most appropriate for this patient at this time.  All issues noted in this document were discussed and addressed.  A limited physical exam was performed with this format.  Please refer to the patient's chart for her consent to telehealth for [facility].   Evaluation Performed:  Follow-up visit  Date:  07/31/2018   ID:  Breanna Hernandez, DOB [birth year], MRN [id number]  Patient Location: Home Provider Location: Home  PCP:  Ann Held, DO  Cardiologist:  Sanda Klein, MD  Electrophysiologist:  None   Chief Complaint:  Ao dissection, palpitations  History of Present Illness:    Breanna Hernandez is a 73 y.o. female with a history of proximal aortic dissection undergoing surgical repair in 2012 (also single-vessel SVG to RCA bypass), postoperative atrial fibrillation without subsequent recurrence, hypertension, hypothyroidism, history of bilateral breast cancer status post mastectomy and radiation therapy, history of thyroid papillary carcinoma.  From a cardiovascular point of view she is doing quite well except for some increase in palpitations.  She wonders whether this is related to the increased stress regarding the social restriction is pertaining to the coronavirus pandemic.  It is of note that that her last labs did show a mild hypokalemia and she is taking hydrochlorothiazide.  She reports some problems with leg pain after a hamstring surgery and some swelling in 1 of her feet, but these are improving.  The patient specifically denies any chest pain at rest exertion, dyspnea  at rest or with exertion, orthopnea, paroxysmal nocturnal dyspnea, syncope, focal neurological deficits, intermittent claudication, lower extremity edema, unexplained weight gain, cough, hemoptysis or wheezing.  Her initial blood pressure when we called her this morning was a little high, but recheck blood pressure is only 120/47 at the time of this visit.  Her heart rate is 49.  She reports that her heart rate is often in the low 50s or high 40s, but she does not have complaints of dizziness, presyncope or excessive fatigue from this.  Holter monitor performed in 2016, when she was on similar medications, showed a heart rate range of 46-81 bpm and frequent PACs.  There was no significant ventricular arrhythmia and no atrial fibrillation.  She had a normal nuclear stress test on February 15, 2017, EF greater than 65%.  Her last CT of the aorta was performed in June 2019 and showed a stable aortic graft reconstruction from the sinotubular junction of the ascending aorta to the proximal aortic arch, no evidence of dissection, with a maximum diameter of 3.5 cm at the level of the proximal descending aorta.  The right coronary bypass graft was visualized and appears to be patent.  She is currently scheduled for repeat CT angiogram of the aorta on June 11 and follow-up with Dr. Roxan Hockey.  The patient does not have symptoms concerning for COVID-19 infection (fever, chills, cough, or new shortness of breath).    Past Medical History:  Diagnosis Date  . Arthritis   . Atrial fibrillation ([place])    h/o post-op 04/2010  . Breast cancer ([place])    x 3 cancer - radiation right x2 ''00,'05,  left '07 mammosite radiation   . Diverticulosis   . Dysrhythmia    a fib  . Fatty liver 01/10/07  . GERD (gastroesophageal reflux disease)    hx of  . Headache    migraines   . 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 HTN (hypertension)   . Hyperlipidemia   . Hyperplastic colon polyp   . Hypothyroidism    . IBS (irritable bowel syndrome)   . Lower extremity neuropathy    occ. affects walking and balance"foot to knees" nondiabetic  . Obesity   . Renal cyst, left   . Thyroid cancer ([place]) 11/2015   surgery Oct 2017   Past Surgical History:  Procedure Laterality Date  . BREAST LUMPECTOMY  2000   Right x2, Left x1  . CHOLECYSTECTOMY    . COLONOSCOPY W/ POLYPECTOMY    . CORONARY ARTERY BYPASS GRAFT  05/03/2010   CABG X1, RCA -Dr Roxan Hockey (with type 1 aortic dissection)  . HYSTEROSCOPY W/D&C N/A 04/13/2014   Procedure: DILATATION AND CURETTAGE /HYSTEROSCOPY Polypectomy;  Surgeon: Maeola Sarah. Landry Mellow, MD;  Location: [facility];  Service: Gynecology;  Laterality: N/A;  Possible Polypectomy  . JOINT REPLACEMENT     Right total hip 03-15-17 Dr. Ninfa Linden  . Median sternotomy, extracorporeal circulation, repair of type 1 aortic dissection with  tube graft from  sinotubular junction to take off of innominate artery, distal anastomoses under deep bypothermic circulatory arrest  05/03/2010   Dr Roxan Hockey  . ROBOTIC ASSISTED TOTAL HYSTERECTOMY WITH BILATERAL SALPINGO OOPHERECTOMY Bilateral 05/18/2014   Procedure: ROBOTIC ASSISTED TOTAL HYSTERECTOMY WITH BILATERAL SALPINGO OOPHORECTOMY SENTINAL LYMPH NODE MAPPING ;  Surgeon: Everitt Amber, MD;  Location: [facility];  Service: Gynecology;  Laterality: Bilateral;  . THYROIDECTOMY N/A 01/09/2016   Procedure: TOTAL THYROIDECTOMY;  Surgeon: Armandina Gemma, MD;  Location: [facility];  Service: General;  Laterality: N/A;  . TONSILLECTOMY    . TOTAL HIP ARTHROPLASTY Right 03/15/2017   Procedure: RIGHT TOTAL HIP ARTHROPLASTY ANTERIOR APPROACH;  Surgeon: Mcarthur Rossetti, MD;  Location: [facility];  Service: Orthopedics;  Laterality: Right;  . TOTAL THYROIDECTOMY  01/09/2016     Current Meds  Medication Sig  . aspirin EC 81 MG tablet Take 81 mg by mouth daily.  . benazepril (LOTENSIN) 40 MG tablet TAKE 1 TABLET BY MOUTH EVERY DAY  . CALCIUM-MAGNESIUM-VITAMIN D PO Take 1 tablet by  mouth 2 (two) times daily.   Marland Kitchen desonide (DESOWEN) 0.05 % cream Apply 1 application topically 3 (three) times a week.  . hydrochlorothiazide (MICROZIDE) 12.5 MG capsule TAKE 2 CAPSULES BY MOUTH EVERY DAY  . levothyroxine (SYNTHROID, LEVOTHROID) 175 MCG tablet Take 1 tablet (175 mcg total) by mouth daily before breakfast.  . LORazepam (ATIVAN) 0.5 MG tablet TAKE 1 TABLET (0.5 MG TOTAL) BY MOUTH 2 (TWO) TIMES DAILY AS NEEDED FOR ANXIETY.  . metoprolol tartrate (LOPRESSOR) 50 MG tablet TAKE 1 TABLET BY MOUTH TWICE A DAY.  Marland Kitchen Omega-3 Fatty Acids (FISH OIL PO) Take 1 capsule by mouth 2 (two) times daily.  Marland Kitchen OVER THE COUNTER MEDICATION Take 1 tablet by mouth every morning. Product Name: EHT Mind enhancement formula Vitamin d 200 units Vitamin b 1.6 mg Vitamin b-12 10 mcg Selinum 70 mcg Coffee Extract 35 mg Alpha leporic acid 50 mg huperzine-a 50 mcg     Allergies:   Patient has no known allergies.   Social History   Tobacco Use  . Smoking status: Never Smoker  . Smokeless tobacco: Never  Used  Substance Use Topics  . Alcohol use: No  . Drug use: No     Family Hx: The patient's family history includes Alzheimer's disease in her mother; Aneurysm in her father; Goiter in her mother; Heart attack in her father; Hypertension in her brother and mother; Kidney disease in her brother. There is no history of Colon cancer.  ROS:   Please see the history of present illness.     All other systems reviewed and are negative.   Prior CV studies:   The following studies were reviewed today:  CT angio June 2019  Labs/Other Tests and Data Reviewed:    EKG:  An ECG dated 03/18/2018 was personally reviewed today and demonstrated:  Mild sinus bradycardia, subtle ST segment depression and T wave inversion in the lateral leads  Recent Labs: 03/04/2018: ALT 38 06/09/2018: BUN 26; Creatinine, Ser 0.86; Hemoglobin 13.1; Platelets 172; Potassium 3.4; Sodium 136 07/25/2018: TSH 1.010   Recent Lipid Panel  Lab Results  Component Value Date/Time   CHOL 248 (H) 03/04/2018 03:02 PM   CHOL 263 (H) 08/17/2014 11:34 AM   TRIG 219.0 (H) 03/04/2018 03:02 PM   TRIG 325 (H) 08/17/2014 11:34 AM   TRIG 293 (HH) 03/29/2006 10:33 AM   HDL 38.70 (L) 03/04/2018 03:02 PM   HDL 39 (L) 08/17/2014 11:34 AM   CHOLHDL 6 03/04/2018 03:02 PM   LDLCALC 153 (H) 05/09/2017 11:42 AM   LDLCALC 159 (H) 08/17/2014 11:34 AM   LDLDIRECT 177.0 03/04/2018 03:02 PM    Wt Readings from Last 3 Encounters:  07/30/18 242 lb 9.6 oz (110 kg)  06/09/18 240 lb (108.9 kg)  03/04/18 256 lb (116.1 kg)     Objective:    Vital Signs:  BP (!) 149/59   Pulse (!) 50   Ht 5\' 4"  (1.626 m)   Wt 242 lb 9.6 oz (110 kg)   BMI 41.64 kg/m    VITAL SIGNS:  reviewed GEN:  no acute distress EYES:  sclerae anicteric, EOMI - Extraocular Movements Intact RESPIRATORY:  normal respiratory effort, symmetric expansion CARDIOVASCULAR:  no peripheral edema SKIN:  no rash, lesions or ulcers. MUSCULOSKELETAL:  no obvious deformities. NEURO:  alert and oriented x 3, no obvious focal deficit PSYCH:  normal affect  ASSESSMENT & PLAN:    1. Hx aortic dissection: Stable repair, no symptoms to suggest aneurysm progression or new dissection.  The focus is on blood pressure control, preferentially using beta-blockers. 2. HTN: Her blood pressures well controlled, typically in the 120s/50s range.  No change in medications. 3. PACs: In the past her palpitations correlated with PACs.  She is mildly hypokalemic on her last labs, probably due to the use of hydrochlorothiazide.  Encourage over-the-counter magnesium supplement and increased dietary intake of potassium. 4. Sinus bradycardia: Due to beta-blockers which are necessary both for suppression of palpitations and prevention of further urinary complications.  She is asymptomatic from this point of view. 5. S/P CABG: Normal recent nuclear stress test, no angina pectoris.  She does have evidence of  atherosclerosis in the abdominal and thoracic aorta and risk factor should be treated.  She has been resistant to the use of lipid-lowering medications.  She has side effects with statins.  I recommended Repatha.  She will reconsider and let me know. 6. HLP: LDL is elevated, target LDL less than 70.  Has repeat labs planned with her PCP. 7. Morbid obesity: Weight loss is strongly encouraged 8. History of postoperative atrial fibrillation without subsequent  recurrence  COVID-19 Education: The signs and symptoms of COVID-19 were discussed with the patient and how to seek care for testing (follow up with PCP or arrange E-visit).  The importance of social distancing was discussed today.  Time:   Today, I have spent 21 minutes with the patient with telehealth technology discussing the above problems.     Medication Adjustments/Labs and Tests Ordered: Current medicines are reviewed at length with the patient today.  Concerns regarding medicines are outlined above.   Tests Ordered: No orders of the defined types were placed in this encounter.   Medication Changes: Meds ordered this encounter  Medications  . Magnesium Oxide 400 MG CAPS    Sig: Take 400 mg daily    Dispense:  30 capsule    Refill:  6    Disposition:  Follow up 12 months  Signed, Sanda Klein, MD  07/31/2018 8:07 AM    [facility]

## 2018-08-06 ENCOUNTER — Telehealth: Payer: Self-pay | Admitting: *Deleted

## 2018-08-06 NOTE — Telephone Encounter (Signed)
Tried to call to set up virtual follow up visit.  No answer/ no vm

## 2018-08-08 ENCOUNTER — Other Ambulatory Visit: Payer: Self-pay

## 2018-08-08 ENCOUNTER — Encounter: Payer: Self-pay | Admitting: Family Medicine

## 2018-08-08 ENCOUNTER — Ambulatory Visit (INDEPENDENT_AMBULATORY_CARE_PROVIDER_SITE_OTHER): Payer: Medicare Other | Admitting: Family Medicine

## 2018-08-08 DIAGNOSIS — I2510 Atherosclerotic heart disease of native coronary artery without angina pectoris: Secondary | ICD-10-CM

## 2018-08-08 DIAGNOSIS — F419 Anxiety disorder, unspecified: Secondary | ICD-10-CM | POA: Diagnosis not present

## 2018-08-08 MED ORDER — SERTRALINE HCL 50 MG PO TABS: 50.0000 mg | ORAL_TABLET | Freq: Every day | ORAL | 3 refills | Status: DC

## 2018-08-08 MED ORDER — LORAZEPAM 0.5 MG PO TABS: 0.5000 mg | ORAL_TABLET | Freq: Two times a day (BID) | ORAL | 1 refills | Status: DC | PRN

## 2018-08-08 NOTE — Progress Notes (Signed)
Virtual Visit via Video Note  I connected with Breanna Hernandez on 08/08/18 at  2:00 PM EDT by a video enabled telemedicine application and verified that I am speaking with the correct person using two identifiers.  Location: Patient: home Provider: office    I discussed the limitations of evaluation and management by telemedicine and the availability of in person appointments. The patient expressed understanding and agreed to proceed.  History of Present Illness: Pt is home c/o increased anxiety.  She has been talking her ativan more frequently and is concerned about becoming addicted to it.     Observations/Objective: Today's Vitals   08/08/18 1401  BP: (!) 125/56  Pulse: (!) 55  Weight: 244 lb 11.2 oz (111 kg)  Height: 5\' 4"  (1.626 m)   Body mass index is 42 kg/m. Pt is in NAD   Assessment and Plan:  1. Anxiety Start zoloft 50 mg 1/2 tab po qd x 1 week then inc to 1 tab daily con't ativan prn F/u 1 month or sooner prn  - LORazepam (ATIVAN) 0.5 MG tablet; Take 1 tablet (0.5 mg total) by mouth 2 (two) times daily as needed for anxiety.  Dispense: 30 tablet; Refill: 1 - sertraline (ZOLOFT) 50 MG tablet; Take 1 tablet (50 mg total) by mouth daily.  Dispense: 30 tablet; Refill: 3 Follow Up Instructions:    I discussed the assessment and treatment plan with the patient. The patient was provided an opportunity to ask questions and all were answered. The patient agreed with the plan and demonstrated an understanding of the instructions.   The patient was advised to call back or seek an in-person evaluation if the symptoms worsen or if the condition fails to improve as anticipated.  I provided 25 minutes of non-face-to-face time during this encounter.   Ann Held, DO

## 2018-08-12 ENCOUNTER — Ambulatory Visit (INDEPENDENT_AMBULATORY_CARE_PROVIDER_SITE_OTHER): Payer: Medicare Other | Admitting: Internal Medicine

## 2018-08-12 ENCOUNTER — Encounter: Payer: Self-pay | Admitting: Internal Medicine

## 2018-08-12 DIAGNOSIS — E890 Postprocedural hypothyroidism: Secondary | ICD-10-CM | POA: Diagnosis not present

## 2018-08-12 DIAGNOSIS — I2510 Atherosclerotic heart disease of native coronary artery without angina pectoris: Secondary | ICD-10-CM | POA: Diagnosis not present

## 2018-08-12 DIAGNOSIS — C73 Malignant neoplasm of thyroid gland: Secondary | ICD-10-CM

## 2018-08-12 MED ORDER — LEVOTHYROXINE SODIUM 175 MCG PO TABS: 175.0000 ug | ORAL_TABLET | Freq: Every day | ORAL | 3 refills | Status: AC

## 2018-08-12 NOTE — Progress Notes (Signed)
Patient ID: Breanna Hernandez, female   DOB: 1946/03/02, 73 y.o.   MRN: [id number]   Patient location: Home My location: Office  Referring Provider: Dr. Harlow Hernandez  I connected with the patient on 08/12/18 at  11:42 AM EDT by a video enabled telemedicine application and verified that I am speaking with the correct person.   I discussed the limitations of evaluation and management by telemedicine and the availability of in person appointments. The patient expressed understanding and agreed to proceed.   Details of the encounter are shown below.  HPI  Breanna Hernandez is a 73 y.o.-year-old female, initially referred by Dr. Harlow Hernandez, presenting for follow-up for thyroid cancer and postsurgical hypothyroidism. Last visit 1 year and 3 mo ago.  Reviewed and addended history: Pt has a h/o Ao dissection in 2012 >> has CT scans q 2 years >> thyroid nodule incidentally found on CT scan  in 2017.  Patient had a thyroid ultrasound on 10/21/2015 that showed a right 1.5 x 1.4 x 1.4 cm nodule, solid, with punctate calcifications.  Pt. Had an FNA of this nodule in 11/2015: Papillary thyroid cancer.  Patient had total thyroidectomy by Dr. Harlow Hernandez on 01/09/2016. 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 Diagnosis 1. Thyroid, lobectomy, Right - PAPILLARY THYROID CARCINOMA, 1.5 CM. - LYMPHOVASCULAR INVASION IS IDENTIFIED. - CARCINOMA IS CONFINED TO THE THYROID. - THE SURGICAL RESECTION MARGINS ARE NEGATIVE FOR CARCINOMA. - TWO BENIGN LYMPH NODES (0/2). - SEE ONCOLOGY TABLE BELOW. 2. Thyroid, lobectomy, Left - LYMPHOCYTIC THYROIDITIS. - THERE IS NO EVIDENCE OF MALIGNANCY. Microscopic Comment 1. THYROID Specimen: Bilateral thyroid lobes Procedure (including lymph node sampling if applicable): Bilateral hemi-thyroidectomy Specimen Integrity (intact/fragmented): Intact Tumor focality: Unifocal Dominant tumor: Maximum tumor size (cm): 1.5 cm (gross measurement). 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 Capsular invasion with degree of invasion if present: N/A Margins: Negative for carcinoma Lymphatic or vascular invasion: Present, focal Lymph nodes: # examined 2; # positive; 0 Extracapsular extension (if applicable): Not identified. TNM code: pT1b , pN0 Non-neoplastic thyroid: Lymphocytic thyroiditis.  Patient had RAI treatment with 46.7 mCi I-131 on 03/19/2016.   On 03/26/2016, the posttreatment whole-body scan was negative for metastasis.   Reviewed previous thyroglobulin and ATA levels: Lab Results  Component Value Date   THYROGLB <0.1 (L) 07/25/2018   THYROGLB <0.1 (L) 05/17/2017   THYROGLB <0.1 (L) 11/13/2016   THYROGLB <0.1 (L) 05/15/2016   THGAB <1.0 07/25/2018   THGAB <1.0 05/17/2017   THGAB <1.0 11/13/2016   THGAB 2 (H) 05/15/2016   Postsurgical hypothyroidism:  Pt is on levothyroxine 175 mcg daily, taken: - in am - fasting - at least 30 min from b'fast - + Ca at noon - no Fe, MVI, PPIs - not on Biotin  She has a h/o hypothyroidism for >30 years >> was on a stable dose of 75 mcg LT4 before the Sx.  Reviewed patient's TFTs: Lab Results  Component Value Date   TSH 1.010 07/25/2018   TSH 2.72 05/17/2017   TSH 3.54 11/13/2016   TSH 14.89 (H) 07/04/2016   TSH 11.48 (H) 05/15/2016   TSH 21.20 (H) 03/22/2016   TSH 37.51 (H) 02/16/2016   TSH 3.10 09/29/2015   TSH 3.11 08/17/2014   TSH 1.72 09/22/2013   FREET4 1.48 07/25/2018   FREET4 1.12 05/17/2017   FREET4 1.26 11/13/2016  FREET4 1.14 07/04/2016   FREET4 0.83 05/15/2016   FREET4 0.87 03/22/2016   FREET4 0.47 (L) 02/16/2016   FREET4 0.99 09/20/2010    Pt denies: - feeling nodules in neck - hoarseness - dysphagia - choking - SOB with lying down  She has + FH of thyroid  disorders in: mother (thyroid nodules). No FH of thyroid cancer. No h/o radiation tx to head or neck other than RAI treatment.  No seaweed or kelp. No recent contrast studies. No herbal supplements but would like to start a B complex with a nerve protective substance >> will send me the ingredient list. No Biotin use. No recent steroids use.   She had R THR 03/2017.  She is feeling better after the surgery.  She still uses a 3 pronged walker outside the house.  ROS: Constitutional: no weight gain/no weight loss, no fatigue, no subjective hyperthermia, no subjective hypothermia Eyes: no blurry vision, no xerophthalmia ENT: no sore throat, + see HPI Cardiovascular: no CP/no SOB/no palpitations/no leg swelling Respiratory: no cough/no SOB/no wheezing Gastrointestinal: no N/no V/no D/no C/no acid reflux Musculoskeletal: no muscle aches/no joint aches Skin: no rashes, no hair loss Neurological: no tremors/no numbness/no tingling/no dizziness  I reviewed pt's medications, allergies, PMH, social hx, family hx, and changes were documented in the history of present illness. Otherwise, unchanged from my initial visit note.  Past Medical History:  Diagnosis Date  . Arthritis   . Atrial fibrillation ([place])    h/o post-op 04/2010  . Breast cancer ([place])    x 3 cancer - radiation right x2 ''00,'05, left '07 mammosite radiation   . Diverticulosis   . Dysrhythmia    a fib  . Fatty liver 01/10/07  . GERD (gastroesophageal reflux disease)    hx of  . Headache    migraines   . 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 HTN (hypertension)   . Hyperlipidemia   . Hyperplastic colon polyp   . Hypothyroidism   . IBS (irritable bowel syndrome)   . Lower extremity neuropathy    occ. affects walking and balance"foot to knees" nondiabetic  . Obesity   . Renal cyst, left   . Thyroid cancer ([place]) 11/2015   surgery Oct 2017   Past Surgical History:  Procedure Laterality Date  . BREAST LUMPECTOMY   2000   Right x2, Left x1  . CHOLECYSTECTOMY    . COLONOSCOPY W/ POLYPECTOMY    . CORONARY ARTERY BYPASS GRAFT  05/03/2010   CABG X1, RCA -Dr Roxan Hockey (with type 1 aortic dissection)  . HYSTEROSCOPY W/D&C N/A 04/13/2014   Procedure: DILATATION AND CURETTAGE /HYSTEROSCOPY Polypectomy;  Surgeon: Maeola Sarah. Landry Mellow, MD;  Location: [facility];  Service: Gynecology;  Laterality: N/A;  Possible Polypectomy  . JOINT REPLACEMENT     Right total hip 03-15-17 Dr. Ninfa Linden  . Median sternotomy, extracorporeal circulation, repair of type 1 aortic dissection with  tube graft from  sinotubular junction to take off of innominate artery, distal anastomoses under deep bypothermic circulatory arrest  05/03/2010   Dr Roxan Hockey  . ROBOTIC ASSISTED TOTAL HYSTERECTOMY WITH BILATERAL SALPINGO OOPHERECTOMY Bilateral 05/18/2014   Procedure: ROBOTIC ASSISTED TOTAL HYSTERECTOMY WITH BILATERAL SALPINGO OOPHORECTOMY SENTINAL LYMPH NODE MAPPING ;  Surgeon: Everitt Amber, MD;  Location: [facility];  Service: Gynecology;  Laterality: Bilateral;  . THYROIDECTOMY N/A 01/09/2016   Procedure: TOTAL THYROIDECTOMY;  Surgeon: Armandina Gemma, MD;  Location: [facility];  Service: General;  Laterality: N/A;  . TONSILLECTOMY    .  TOTAL HIP ARTHROPLASTY Right 03/15/2017   Procedure: RIGHT TOTAL HIP ARTHROPLASTY ANTERIOR APPROACH;  Surgeon: Mcarthur Rossetti, MD;  Location: [facility];  Service: Orthopedics;  Laterality: Right;  . TOTAL THYROIDECTOMY  01/09/2016   Social History   Social History  . Marital status: Married    Spouse name: N/A  . Number of children: 6   Occupational History  . retired   Social History Main Topics  . Smoking status: Never Smoker  . Smokeless tobacco: Never Used  . Alcohol use No  . Drug use: No   Current Outpatient Medications on File Prior to Visit  Medication Sig Dispense Refill  . aspirin EC 81 MG tablet Take 81 mg by mouth daily.    . benazepril (LOTENSIN) 40 MG tablet TAKE 1 TABLET BY MOUTH EVERY DAY 90 tablet  1  . CALCIUM-MAGNESIUM-VITAMIN D PO Take 1 tablet by mouth 2 (two) times daily.     Marland Kitchen desonide (DESOWEN) 0.05 % cream Apply 1 application topically 3 (three) times a week.    . hydrochlorothiazide (MICROZIDE) 12.5 MG capsule TAKE 2 CAPSULES BY MOUTH EVERY DAY 180 capsule 3  . levothyroxine (SYNTHROID, LEVOTHROID) 175 MCG tablet Take 1 tablet (175 mcg total) by mouth daily before breakfast. 90 tablet 3  . LORazepam (ATIVAN) 0.5 MG tablet Take 1 tablet (0.5 mg total) by mouth 2 (two) times daily as needed for anxiety. 30 tablet 1  . Magnesium Oxide 400 MG CAPS Take 400 mg daily 30 capsule 6  . metoprolol tartrate (LOPRESSOR) 50 MG tablet TAKE 1 TABLET BY MOUTH TWICE A DAY. 180 tablet 3  . Omega-3 Fatty Acids (FISH OIL PO) Take 1 capsule by mouth 2 (two) times daily.    Marland Kitchen OVER THE COUNTER MEDICATION Take 1 tablet by mouth every morning. Product Name: EHT Mind enhancement formula Vitamin d 200 units Vitamin b 1.6 mg Vitamin b-12 10 mcg Selinum 70 mcg Coffee Extract 35 mg Alpha leporic acid 50 mg huperzine-a 50 mcg    . sertraline (ZOLOFT) 50 MG tablet Take 1 tablet (50 mg total) by mouth daily. 30 tablet 3  . [DISCONTINUED] amLODipine-benazepril (LOTREL) 5-20 MG per capsule Take 1 capsule by mouth daily.      . [DISCONTINUED] escitalopram (LEXAPRO) 10 MG tablet Take 10 mg by mouth daily.      . [DISCONTINUED] lisinopril (PRINIVIL,ZESTRIL) 20 MG tablet Take 20 mg by mouth daily.       No current facility-administered medications on file prior to visit.    No Known Allergies Family History  Problem Relation Age of Onset  . Aneurysm Father        CNS  . Heart attack Father        x2  . Alzheimer's disease Mother   . Hypertension Mother   . Goiter Mother   . Hypertension Brother   . Kidney disease Brother   . Colon cancer Neg Hx    PE: There were no vitals taken for this visit. Wt Readings from Last 3 Encounters:  08/08/18 244 lb 11.2 oz (111 kg)  07/30/18 242 lb 9.6 oz (110 kg)   06/09/18 240 lb (108.9 kg)   Constitutional:  in NAD  The physical exam was not performed (virtual visit).  ASSESSMENT: 1. Thyroid cancer - see HPI  2. Postsurgical Hypothyroidism  PLAN:  1. Thyroid cancer - papillary -Patient with stage I TNM thyroid cancer.  The tumor was 1.5 cm, however, it had lymphovascular invasion and it also contained Hurthle  cell features.  Therefore, I recommended RAI treatment for thyroid ablation.  She had RAI ablation with Thyrogen in 03/2016 and posttreatment WBS was negative for metastases.  Since then, her thyroglobulin has been undetectable.  However, she initially had positive ATA antibodies, which became negative in 2018, possibly 2/2 Hashimoto's thyroiditis -We reviewed together her most recent thyroglobulin and ATA antibodies, both undetectable last month -At last visit, I ordered a new neck ultrasound, but she did not have this >> had to cancel this 2/2 leg injury >> will order this again - explained why this is needed (baseline) -She denies any neck compression symptoms -I will see her back in 1 year  2. Patient with history of total thyroidectomy for thyroid cancer, now with iatrogenic hypothyroidism initially uncontrolled, now with better control  - latest thyroid labs reviewed with pt >> normal 07/2018 - she continues on LT4 175 mcg daily - pt feels good on this dose. - we discussed about taking the thyroid hormone every day, with water, >30 minutes before breakfast, separated by >4 hours from acid reflux medications, calcium, iron, multivitamins. Pt. is taking it correctly. She will start a B complex supplement  - will send me the ingredients to see if any interfere with LT4 absorption.  We discussed that stopping biotin few days prior to labs in the future.  Orders Placed This Encounter  Procedures  . US Soft Tissue Head/Neck   I will addend the results when they become available.  Philemon Kingdom, MD PhD [facility]

## 2018-08-12 NOTE — Patient Instructions (Signed)
I will order a new neck U/S.  Please continue Levothyroxine 175 mcg daily.  Take the thyroid hormone every day, with water, at least 30 minutes before breakfast, separated by at least 4 hours from: - acid reflux medications - calcium - iron - multivitamins  Please come back for a follow-up appointment in 1 year.

## 2018-08-21 ENCOUNTER — Other Ambulatory Visit: Payer: Self-pay | Admitting: *Deleted

## 2018-08-21 MED ORDER — POTASSIUM CHLORIDE CRYS ER 20 MEQ PO TBCR: 20.0000 meq | EXTENDED_RELEASE_TABLET | Freq: Every day | ORAL | 11 refills | Status: DC

## 2018-08-24 ENCOUNTER — Encounter: Payer: Self-pay | Admitting: Family Medicine

## 2018-08-25 NOTE — Telephone Encounter (Signed)
If she is talking 1/2 50 mg ---- she can just stop Does she want to try something new

## 2018-08-30 ENCOUNTER — Other Ambulatory Visit: Payer: Self-pay | Admitting: Family Medicine

## 2018-08-30 DIAGNOSIS — F419 Anxiety disorder, unspecified: Secondary | ICD-10-CM

## 2018-09-18 ENCOUNTER — Other Ambulatory Visit: Payer: Medicare Other

## 2018-09-23 ENCOUNTER — Other Ambulatory Visit: Payer: Self-pay

## 2018-09-23 ENCOUNTER — Ambulatory Visit: Payer: Medicare Other | Admitting: Thoracic Surgery (Cardiothoracic Vascular Surgery)

## 2018-09-23 MED ORDER — BENAZEPRIL HCL 40 MG PO TABS: 40.0000 mg | ORAL_TABLET | Freq: Every day | ORAL | 1 refills | Status: DC

## 2018-10-09 ENCOUNTER — Telehealth: Payer: Self-pay | Admitting: *Deleted

## 2018-10-09 DIAGNOSIS — R002 Palpitations: Secondary | ICD-10-CM

## 2018-10-09 DIAGNOSIS — I10 Essential (primary) hypertension: Secondary | ICD-10-CM

## 2018-10-09 NOTE — Telephone Encounter (Signed)
  Left a message for the patient to call back.   Per Dr. Victorino December patient advice request response: Sorry it didn't work. We can't increase the beta blocker any further, since your heart rate is already quite slow. Please restart the HCTZ lower dose (12.5 mg daily) and restart the potassium supplement 20 mEq daily. Let's repeat a rhythm monitor (3-day). My nurse will call you to set that up. WIth that info we can see if it is worthwhile pursuing aggressive rhythm management (my concern would be that using more potent rhythm medications may lead to the need for pacemaker implantation or other side effects). Dr. Loletha Grayer

## 2018-10-15 ENCOUNTER — Telehealth: Payer: Self-pay | Admitting: Radiology

## 2018-10-15 ENCOUNTER — Other Ambulatory Visit: Payer: Self-pay | Admitting: Family Medicine

## 2018-10-15 ENCOUNTER — Encounter: Payer: Self-pay | Admitting: Family Medicine

## 2018-10-15 DIAGNOSIS — F419 Anxiety disorder, unspecified: Secondary | ICD-10-CM

## 2018-10-15 MED ORDER — HYDROCHLOROTHIAZIDE 12.5 MG PO CAPS: 12.5000 mg | ORAL_CAPSULE | Freq: Every day | ORAL | 3 refills | Status: AC

## 2018-10-15 NOTE — Addendum Note (Signed)
Addended by: Ricci Barker on: 10/15/2018 11:25 AM   Modules accepted: Orders

## 2018-10-15 NOTE — Telephone Encounter (Signed)
Enrolled patient for a 3 day Zio monitor to be mailed. Brief instructions were gone over witht he patient and she knows to expect the monitor to arrive in 3-4 days 

## 2018-10-15 NOTE — Telephone Encounter (Signed)
Spoke with the patient. She agrees to try the 3 day monitor and is currently taking the HCTZ 12.5 mg once daily and 20 mEq of the potassium.

## 2018-10-16 NOTE — Telephone Encounter (Signed)
Requesting: Ativan Contract: 05/09/2017 UDS: 05/09/2017,next screen 11/06/2017 Last OV: 08/08/2018 Next OV: N/A Last Refill: 08/08/2018, #30--1 RF Database:   Please advise

## 2018-10-20 ENCOUNTER — Ambulatory Visit (INDEPENDENT_AMBULATORY_CARE_PROVIDER_SITE_OTHER): Payer: Medicare Other

## 2018-10-20 DIAGNOSIS — R002 Palpitations: Secondary | ICD-10-CM

## 2018-10-23 LAB — COLOGUARD: Cologuard: NEGATIVE

## 2018-11-04 ENCOUNTER — Other Ambulatory Visit: Payer: Self-pay

## 2018-11-05 ENCOUNTER — Telehealth: Payer: Self-pay | Admitting: *Deleted

## 2018-11-05 NOTE — Telephone Encounter (Signed)
Follow Up:     She is returning your call.

## 2018-11-05 NOTE — Telephone Encounter (Signed)
-----   Message from Sanda Klein, MD sent at 11/04/2018  2:26 PM EDT ----- Very brief , but very frequent episodes of atrial tachycardia.  This is not dangerous, but I understand the symptoms can be quite troublesome.  No indication for anticoagulation. Unfortunately, I do not think we can increase the dose of metoprolol due to underlying slow heartbeat, not without pacemaker implantation. While we do have one antiarrhythmic medication that might work (I am thinking of dofetilide), to start this medication would require a 3-day hospitalization. Other options such as flecainide cannot be used due to the history of coronary artery disease, while amiodarone and sotalol will also cause extra slowing of the heartbeat.  If she does not mind, I would like to have her consult with 1 of my electrophysiology partners.  I suspect they will be able to do this as a virtual visit if Breanna Hernandez prefers that.

## 2018-11-05 NOTE — Telephone Encounter (Signed)
Left a message for the patient to call back.  

## 2018-11-06 ENCOUNTER — Encounter: Payer: Self-pay | Admitting: *Deleted

## 2018-11-06 NOTE — Telephone Encounter (Signed)
Patient made aware of results and verbalized understanding.  She stated that she feels like the tachycardia has gotten better. She denied being symptomatic but did say she has had some anxiety due to the virus pandemic.  She has been taking Magnesium 300 mg daily for the past several weeks.   She stated that she was hesitant about the referral but if Dr. Sallyanne Kuster felt like it was necessary than she would move forward with it. She has asked that communication be sent through [place].

## 2018-11-06 NOTE — Telephone Encounter (Signed)
Message sent through My Chart. 

## 2018-11-06 NOTE — Telephone Encounter (Signed)
The referral is not urgent. The arrhythmia is not dangerous. If she is improved, we can put it off.

## 2018-11-11 ENCOUNTER — Telehealth: Payer: Self-pay | Admitting: Family Medicine

## 2018-11-11 NOTE — Telephone Encounter (Signed)
Patient calling to inquire if PCP has received cologuard results.

## 2018-11-12 NOTE — Telephone Encounter (Signed)
Spoke with patient and gave results of Colorguard.

## 2018-11-12 NOTE — Telephone Encounter (Signed)
Pt calling back to check status. Please advise

## 2018-11-26 ENCOUNTER — Other Ambulatory Visit: Payer: Self-pay | Admitting: Family Medicine

## 2018-11-26 DIAGNOSIS — F419 Anxiety disorder, unspecified: Secondary | ICD-10-CM

## 2018-11-27 NOTE — Telephone Encounter (Signed)
Requesting:ativan  Contract:yes UDS:n/a Last OV:08/08/18 Next OV:n/a Last Refill:10/16/18  #30-1rf Database:   Please advise

## 2018-12-07 ENCOUNTER — Telehealth: Payer: Self-pay | Admitting: Internal Medicine

## 2018-12-07 NOTE — Telephone Encounter (Signed)
Patient called in early this morning with complaints of palpitations that woke her up from sleep.  Denies any other associated symptoms of chest pain, shortness of breath, lightheadedness/dizziness, or presyncope/syncope.  She states that she has been feeling a little queasy since earlier in the day, however.  Overall, patient states that her palpitations and queasiness have subsided and she is comfortable.    She has had palpitations before, but was worried because she had never experienced them while she slept.  She does not want to seek evaluation in the ED at this time.  Patient was reassured and given anticipatory guidance about when she should re-conference or come into the emergency department for more urgent evaluation.  All questions were answered appropriately and patient voiced full understanding.

## 2018-12-08 ENCOUNTER — Encounter: Payer: Self-pay | Admitting: Internal Medicine

## 2018-12-11 ENCOUNTER — Telehealth: Payer: Self-pay | Admitting: Cardiovascular Disease

## 2018-12-11 NOTE — Telephone Encounter (Signed)
There is nothing wrong with a heart rate in the 50s unless it is making her excessively weak or dizzy. Because of her dissection history, a systolic BP in the [id number] and a heart rate in the 50s at rest sounds just right. If I remember correctly, we increased the evening dose of atenolol since the BP was still a little high.

## 2018-12-11 NOTE — Telephone Encounter (Signed)
Per pt this has been going on for several weeks and does note some weakness as well as dizziness When I spoke with pt earlier I thought she said today was only day.Per pt  couple weeks ago noted HR of 46 and 49 .Will forward to Dr Croitoru./cy

## 2018-12-11 NOTE — Telephone Encounter (Signed)
Patient is calling in regards to the MyChart message she sent over today. She would like to speak to the nurse in regards to her pulse being slow and if she needs to adjust her medications.

## 2018-12-11 NOTE — Telephone Encounter (Signed)
Spoke with pt and c/o head fullness and nausea Pt has checked glucose and was 141 after eating. HR is 55 at this time Per pt is taking Metoprolol 50 mg in am and 75 mg in pm wanted to know if could take just 50 mg in evening and see if this helps with bradycardia Will forward to Dr Sallyanne Kuster for review and recommendations ./cy

## 2018-12-11 NOTE — Telephone Encounter (Signed)
OK, cut back the beta blocker to 50 mg twice daily

## 2018-12-12 NOTE — Telephone Encounter (Signed)
Pt aware of recommendations and agrees with plan ./cy 

## 2018-12-19 NOTE — Telephone Encounter (Signed)
Called pt regarding mychart message. Pt report she woke up this morning around 3 am with heart racing, feeling dizzy, and nauseous. She report taking and extra 25 mg of Metoprolol but took symptoms almost 3 hours to subside. Pt report HR is currently within normal range but she still doesn't feel like herself.   Nurse scheduled pt a virtual appointment with Almyra Deforest, PA for next Wednesday 9/16 at 11:30. Nurse informed pt that this was the earliest appointment available, but a message would be routed to Dr. Loletha Grayer to see if he recommends anything additional.

## 2018-12-23 ENCOUNTER — Other Ambulatory Visit: Payer: Self-pay

## 2018-12-23 ENCOUNTER — Encounter: Payer: Self-pay | Admitting: Family Medicine

## 2018-12-23 NOTE — Telephone Encounter (Signed)
Can we do a virtual visit ---- or she can come in

## 2018-12-24 ENCOUNTER — Ambulatory Visit (INDEPENDENT_AMBULATORY_CARE_PROVIDER_SITE_OTHER): Payer: Medicare Other | Admitting: Family Medicine

## 2018-12-24 ENCOUNTER — Encounter: Payer: Self-pay | Admitting: Family Medicine

## 2018-12-24 ENCOUNTER — Encounter: Payer: Self-pay | Admitting: Physician Assistant

## 2018-12-24 ENCOUNTER — Telehealth: Payer: Self-pay

## 2018-12-24 ENCOUNTER — Telehealth (INDEPENDENT_AMBULATORY_CARE_PROVIDER_SITE_OTHER): Payer: Medicare Other | Admitting: Physician Assistant

## 2018-12-24 VITALS — BP 127/59 | HR 48 | Ht 64.0 in | Wt 237.0 lb

## 2018-12-24 DIAGNOSIS — I10 Essential (primary) hypertension: Secondary | ICD-10-CM

## 2018-12-24 DIAGNOSIS — Z8679 Personal history of other diseases of the circulatory system: Secondary | ICD-10-CM

## 2018-12-24 DIAGNOSIS — R002 Palpitations: Secondary | ICD-10-CM

## 2018-12-24 DIAGNOSIS — E039 Hypothyroidism, unspecified: Secondary | ICD-10-CM

## 2018-12-24 DIAGNOSIS — Z951 Presence of aortocoronary bypass graft: Secondary | ICD-10-CM

## 2018-12-24 DIAGNOSIS — F419 Anxiety disorder, unspecified: Secondary | ICD-10-CM | POA: Diagnosis not present

## 2018-12-24 DIAGNOSIS — I480 Paroxysmal atrial fibrillation: Secondary | ICD-10-CM

## 2018-12-24 DIAGNOSIS — I2510 Atherosclerotic heart disease of native coronary artery without angina pectoris: Secondary | ICD-10-CM

## 2018-12-24 MED ORDER — SERTRALINE HCL 50 MG PO TABS: 50.0000 mg | ORAL_TABLET | Freq: Every day | ORAL | 3 refills | Status: DC

## 2018-12-24 MED ORDER — LORAZEPAM 0.5 MG PO TABS: 0.5000 mg | ORAL_TABLET | Freq: Two times a day (BID) | ORAL | 1 refills | Status: DC | PRN

## 2018-12-24 NOTE — Progress Notes (Signed)
Virtual Visit via Video Note  I connected with [name] on 12/24/18 at 10:00 AM EDT by a video enabled telemedicine application and verified that I am speaking with the correct person using two identifiers.  Location: Patient: home  Provider: home   I discussed the limitations of evaluation and management by telemedicine and the availability of in person appointments. The patient expressed understanding and agreed to proceed.  History of Present Illness: Pt is home c/o panic attacks and palpitations.  She is talking to cardiology at 11 am   She thinks its her anxiety because her bp and pulse seem ok and she has no cp or sob     Observations/Objective: 128/67  55 afebrile  Pt is in nad She obviously does not feel well however.   No sob She just feels like her heart is racing even though her pulse is good   Assessment and Plan: 1. Anxiety Worsening-- pt had stopped zoloft---- restart  Inc lorazepam to tid and f/u 1 month or sooner prn  - sertraline (ZOLOFT) 50 MG tablet; Take 1 tablet (50 mg total) by mouth daily.  Dispense: 30 tablet; Refill: 3 - LORazepam (ATIVAN) 0.5 MG tablet; Take 1 tablet (0.5 mg total) by mouth 2 (two) times daily as needed for anxiety.  Dispense: 90 tablet; Refill: 1 - Thyroid Panel With TSH; Future - CBC with Differential/Platelet; Future - Comprehensive metabolic panel; Future  2. Palpitations Pt has f/u with cardiology today - Thyroid Panel With TSH; Future - CBC with Differential/Platelet; Future - Comprehensive metabolic panel; Future   Follow Up Instructions:    I discussed the assessment and treatment plan with the patient. The patient was provided an opportunity to ask questions and all were answered. The patient agreed with the plan and demonstrated an understanding of the instructions.   The patient was advised to call back or seek an in-person evaluation if the symptoms worsen or if the condition fails to improve as anticipated.  I  provided 2a  minutes of non-face-to-face time during this encounter.   Ann Held, DO

## 2018-12-24 NOTE — Patient Instructions (Signed)
Medication Instructions:   Continue to take Metoprolol Tartrate (Lopressor) 50 mg 2 times a day, may take an additional 1/2 (half) tablet as needed for palpitations  If you need a refill on your cardiac medications before your next appointment, please call your pharmacy.   Lab work: NONE ordered at this time of appointment   If you have labs (blood work) drawn today and your tests are completely normal, you will receive your results only by: Marland Kitchen MyChart Message (if you have MyChart) OR . A paper copy in the mail If you have any lab test that is abnormal or we need to change your treatment, we will call you to review the results.  Testing/Procedures: You have been referred to Electrophysiologist    Follow-Up: At [facility], you and your health needs are our priority.  As part of our continuing mission to provide you with exceptional heart care, we have created designated Provider Care Teams.  These Care Teams include your primary Cardiologist (physician) and Advanced Practice Providers (APPs -  Physician Assistants and Nurse Practitioners) who all work together to provide you with the care you need, when you need it. . You will need a follow up appointment in 4 months-January 2021 with Breanna Klein, MD   Any Other Special Instructions Will Be Listed Below (If Applicable).

## 2018-12-24 NOTE — Telephone Encounter (Signed)
Called patient went over her AVS. Patient verbalized an understanding all (if any) questions were answered. Patient informed AVS will be put in the mail. 

## 2018-12-24 NOTE — Progress Notes (Signed)
Virtual Visit via Telephone Note   This visit type was conducted due to national recommendations for restrictions regarding the COVID-19 Pandemic (e.g. social distancing) in an effort to limit this patient's exposure and mitigate transmission in our community.  Due to her co-morbid illnesses, this patient is at least at moderate risk for complications without adequate follow up.  This format is felt to be most appropriate for this patient at this time.  The patient did not have access to video technology/had technical difficulties with video requiring transitioning to audio format only (telephone).  All issues noted in this document were discussed and addressed.  No physical exam could be performed with this format.  Please refer to the patient's chart for her  consent to telehealth for [facility].   Date:  12/24/2018   ID:  Breanna Hernandez, DOB 1946/02/03, MRN [id number]  Patient Location: Home Provider Location: Home  PCP:  Carollee Herter, Alferd Apa, DO  Cardiologist:  Sanda Klein, MD  Electrophysiologist:  None   Evaluation Performed:  Follow-Up Visit  Chief Complaint:  palpitation  History of Present Illness:    Breanna Hernandez is a 73 y.o. female with PMH of type I aortic dissection in 2012 requiring urgent surgery, postop A. fib, hypertension, hypothyroidism, history of breast cancer s/p surgery and radiation therapy, and history of SVG to RCA (as she was noted to have plaque in her RCA during the surgery).  There was suspicion that she may have coronary artery disease in other area as well, however she never had a cardiac catheterization.  Holter monitor performed in 2016 showed heart rate between 46 to 81 bpm, frequent PACs, no significant ventricular arrhythmia or atrial fibrillation. A Myoview was obtained on 02/15/2017 which showed EF greater than 65%, low risk Myoview without ischemia or infarction.  Last CT angiogram of the chest obtained on 09/24/2017 showed stable appearance of  the thoracic aortic graft and a negative thoracic aorta, no evidence of thoracic aortic aneurysm or dissection, stable small splenic artery aneurysm.    Due to recurrent palpitation, she wore a 3-day ZIO monitor in July 2020.  This showed a very brief episode of atrial tachycardia, no atrial fibrillation.  It was felt that the beta-blocker could not be uptitrated due to underlying slow heartbeat.  Patient wished to hold off on the EP referral at this time.  Patient was contacted today via telephone visit for evaluation of palpitation.  She had a 3 major episode of tachycardia palpitation recently.  They seem to occur more when she just wake up in the morning, or may also occur in the middle of the night as well.  She is aware that she can take additional half a tablet of metoprolol on a as needed basis.  At this time, I am also hesitant to try to increase the metoprolol permanently given the relative bradycardia.  I will refer her to the EP team for consideration of other treatment.  It is unclear to me if her symptom is triggered by anxiety or is anxiety as a result of her symptom.  Her primary care provider has placed her on Zoloft and increased her antianxiety medications this morning via virtual visit.  Otherwise she denies any exertional chest pain or shortness of breath.  The patient does not have symptoms concerning for COVID-19 infection (fever, chills, cough, or new shortness of breath).    Past Medical History:  Diagnosis Date   Arthritis    Atrial fibrillation ([address])  h/o post-op 04/2010   Breast cancer ([place])    x 3 cancer - radiation right x2 ''00,'05, left '07 mammosite radiation    Diverticulosis    Dysrhythmia    a fib   Fatty liver 01/10/07   GERD (gastroesophageal reflux disease)    hx of   Headache    migraines    History of aortic dissection 05/03/10   type 1   HTN (hypertension)    Hyperlipidemia    Hyperplastic colon polyp    Hypothyroidism    IBS  (irritable bowel syndrome)    Lower extremity neuropathy    occ. affects walking and balance"foot to knees" nondiabetic   Obesity    Renal cyst, left    Thyroid cancer ([place]) 11/2015   surgery Oct 2017   Past Surgical History:  Procedure Laterality Date   BREAST LUMPECTOMY  2000   Right x2, Left x1   CHOLECYSTECTOMY     COLONOSCOPY W/ POLYPECTOMY     CORONARY ARTERY BYPASS GRAFT  05/03/2010   CABG X1, RCA -Dr Roxan Hockey (with type 1 aortic dissection)   HYSTEROSCOPY W/D&C N/A 04/13/2014   Procedure: DILATATION AND CURETTAGE /HYSTEROSCOPY Polypectomy;  Surgeon: Maeola Sarah. Landry Mellow, MD;  Location: [facility];  Service: Gynecology;  Laterality: N/A;  Possible Polypectomy   JOINT REPLACEMENT     Right total hip 03-15-17 Dr. Ninfa Linden   Median sternotomy, extracorporeal circulation, repair of type 1 aortic dissection with  tube graft from  sinotubular junction to take off of innominate artery, distal anastomoses under deep bypothermic circulatory arrest  05/03/2010   Dr Roxan Hockey   ROBOTIC ASSISTED TOTAL HYSTERECTOMY WITH BILATERAL SALPINGO OOPHERECTOMY Bilateral 05/18/2014   Procedure: ROBOTIC ASSISTED TOTAL HYSTERECTOMY WITH BILATERAL SALPINGO OOPHORECTOMY SENTINAL LYMPH NODE MAPPING ;  Surgeon: Everitt Amber, MD;  Location: [facility];  Service: Gynecology;  Laterality: Bilateral;   THYROIDECTOMY N/A 01/09/2016   Procedure: TOTAL THYROIDECTOMY;  Surgeon: Armandina Gemma, MD;  Location: [facility];  Service: General;  Laterality: N/A;   TONSILLECTOMY     TOTAL HIP ARTHROPLASTY Right 03/15/2017   Procedure: RIGHT TOTAL HIP ARTHROPLASTY ANTERIOR APPROACH;  Surgeon: Mcarthur Rossetti, MD;  Location: [facility];  Service: Orthopedics;  Laterality: Right;   TOTAL THYROIDECTOMY  01/09/2016     Current Meds  Medication Sig   aspirin EC 81 MG tablet Take 81 mg by mouth daily.   benazepril (LOTENSIN) 40 MG tablet Take 1 tablet (40 mg total) by mouth daily.   CALCIUM-MAGNESIUM-VITAMIN D PO Take 1 tablet by  mouth 2 (two) times daily.    hydrochlorothiazide (MICROZIDE) 12.5 MG capsule Take 1 capsule (12.5 mg total) by mouth daily.   levothyroxine (SYNTHROID) 175 MCG tablet Take 1 tablet (175 mcg total) by mouth daily before breakfast.   LORazepam (ATIVAN) 0.5 MG tablet Take 1 tablet (0.5 mg total) by mouth 2 (two) times daily as needed for anxiety.   Magnesium Oxide 400 MG CAPS Take 400 mg daily   metoprolol tartrate (LOPRESSOR) 50 MG tablet TAKE 1 TABLET BY MOUTH TWICE A DAY.   Omega-3 Fatty Acids (FISH OIL PO) Take 1 capsule by mouth 2 (two) times daily.   OVER THE COUNTER MEDICATION Take 1 tablet by mouth every morning. Product Name: EHT Mind enhancement formula Vitamin d 200 units Vitamin b 1.6 mg Vitamin b-12 10 mcg Selinum 70 mcg Coffee Extract 35 mg Alpha leporic acid 50 mg huperzine-a 50 mcg   sertraline (ZOLOFT) 50 MG tablet Take 1 tablet (50 mg total) by  mouth daily.     Allergies:   Patient has no known allergies.   Social History   Tobacco Use   Smoking status: Never Smoker   Smokeless tobacco: Never Used  Substance Use Topics   Alcohol use: No   Drug use: No     Family Hx: The patient's family history includes Alzheimer's disease in her mother; Aneurysm in her father; Goiter in her mother; Heart attack in her father; Hypertension in her brother and mother; Kidney disease in her brother. There is no history of Colon cancer.  ROS:   Please see the history of present illness.     All other systems reviewed and are negative.   Prior CV studies:   The following studies were reviewed today:  myoview 02/15/2017  Nuclear stress EF: 83%. The left ventricular ejection fraction is hyperdynamic (>65%).  The study is normal.  This is a low risk study.  Labs/Other Tests and Data Reviewed:    EKG:  An ECG dated 03/18/2018 was personally reviewed today and demonstrated:  Sinus bradycardia with T wave inversion in lateral leads  Recent Labs: 03/04/2018: ALT  38 06/09/2018: BUN 26; Creatinine, Ser 0.86; Hemoglobin 13.1; Platelets 172; Potassium 3.4; Sodium 136 07/25/2018: TSH 1.010   Recent Lipid Panel Lab Results  Component Value Date/Time   CHOL 248 (H) 03/04/2018 03:02 PM   CHOL 263 (H) 08/17/2014 11:34 AM   TRIG 219.0 (H) 03/04/2018 03:02 PM   TRIG 325 (H) 08/17/2014 11:34 AM   TRIG 293 (HH) 03/29/2006 10:33 AM   HDL 38.70 (L) 03/04/2018 03:02 PM   HDL 39 (L) 08/17/2014 11:34 AM   CHOLHDL 6 03/04/2018 03:02 PM   LDLCALC 153 (H) 05/09/2017 11:42 AM   LDLCALC 159 (H) 08/17/2014 11:34 AM   LDLDIRECT 177.0 03/04/2018 03:02 PM    Wt Readings from Last 3 Encounters:  12/24/18 237 lb (107.5 kg)  08/08/18 244 lb 11.2 oz (111 kg)  07/30/18 242 lb 9.6 oz (110 kg)     Objective:    Vital Signs:  BP (!) 127/59    Pulse (!) 48    Ht 5\' 4"  (1.626 m)    Wt 237 lb (107.5 kg)    BMI 40.68 kg/m    VITAL SIGNS:  reviewed  ASSESSMENT & PLAN:    1. Tachycardia palpitation: Recent heart monitor demonstrated occasional atrial tachycardia, no atrial fibrillation.  Unable to uptitrated rate control therapy due to relative bradycardia.  I did advise her to take additional half a tablet of metoprolol on a as needed basis for tachycardia palpitation.  Dr. Sallyanne Kuster previously recommended referral to EP for consideration of alternative therapy, however she wished to hold off.  She is more agreeable at this point to EP evaluation.  2. History of aortic dissection in 2012: Stable on last CT angiogram of the chest in June 2019.  3. Postop A. fib: No recurrence despite recent tachycardia palpitation.  No plan for anticoagulation therapy unless recurrence.  4. History of SVG to RCA: Denies any recent chest pain  5. Hypertension: Blood pressure stable  6. Hypothyroidism: On Synthroid.    COVID-19 Education: The signs and symptoms of COVID-19 were discussed with the patient and how to seek care for testing (follow up with PCP or arrange E-visit).  The  importance of social distancing was discussed today.  Time:   Today, I have spent 22 minutes with the patient with telehealth technology discussing the above problems.     Medication Adjustments/Labs and  Tests Ordered: Current medicines are reviewed at length with the patient today.  Concerns regarding medicines are outlined above.   Tests Ordered: Orders Placed This Encounter  Procedures   Ambulatory referral to Cardiac Electrophysiology    Medication Changes: No orders of the defined types were placed in this encounter.   Follow Up:  In Person in 3 month(s) Refer to EP  Signed, Almyra Deforest, [place]  12/24/2018 11:43 PM    [place]

## 2018-12-25 ENCOUNTER — Other Ambulatory Visit: Payer: Self-pay | Admitting: Family Medicine

## 2018-12-25 ENCOUNTER — Encounter: Payer: Self-pay | Admitting: Family Medicine

## 2018-12-25 DIAGNOSIS — F419 Anxiety disorder, unspecified: Secondary | ICD-10-CM

## 2018-12-25 MED ORDER — LORAZEPAM 0.5 MG PO TABS: 0.5000 mg | ORAL_TABLET | Freq: Three times a day (TID) | ORAL | 1 refills | Status: DC | PRN

## 2018-12-25 NOTE — Telephone Encounter (Signed)
done

## 2019-01-01 ENCOUNTER — Other Ambulatory Visit (INDEPENDENT_AMBULATORY_CARE_PROVIDER_SITE_OTHER): Payer: Medicare Other

## 2019-01-01 ENCOUNTER — Other Ambulatory Visit: Payer: Self-pay

## 2019-01-01 ENCOUNTER — Telehealth: Payer: Self-pay | Admitting: Family Medicine

## 2019-01-01 DIAGNOSIS — R002 Palpitations: Secondary | ICD-10-CM | POA: Diagnosis not present

## 2019-01-01 DIAGNOSIS — F419 Anxiety disorder, unspecified: Secondary | ICD-10-CM | POA: Diagnosis not present

## 2019-01-01 DIAGNOSIS — E785 Hyperlipidemia, unspecified: Secondary | ICD-10-CM | POA: Diagnosis not present

## 2019-01-01 LAB — COMPREHENSIVE METABOLIC PANEL
ALT: 27 U/L (ref 0–35)
AST: 31 U/L (ref 0–37)
Albumin: 4.1 g/dL (ref 3.5–5.2)
Alkaline Phosphatase: 91 U/L (ref 39–117)
BUN: 13 mg/dL (ref 6–23)
CO2: 31 mEq/L (ref 19–32)
Calcium: 9.5 mg/dL (ref 8.4–10.5)
Chloride: 100 mEq/L (ref 96–112)
Creatinine, Ser: 0.78 mg/dL (ref 0.40–1.20)
GFR: 72.04 mL/min (ref >60.00)
Glucose, Bld: 94 mg/dL (ref 70–99)
Potassium: 3.5 mEq/L (ref 3.5–5.1)
Sodium: 140 mEq/L (ref 135–145)
Total Bilirubin: 1.8 mg/dL — ABNORMAL HIGH (ref 0.2–1.2)
Total Protein: 6.9 g/dL (ref 6.0–8.3)

## 2019-01-01 LAB — CBC WITH DIFFERENTIAL/PLATELET
Basophils Absolute: 0 10*3/uL (ref 0.0–0.1)
Basophils Relative: 0.4 % (ref 0.0–3.0)
Eosinophils Absolute: 0 10*3/uL (ref 0.0–0.7)
Eosinophils Relative: 0.1 % (ref 0.0–5.0)
HCT: 40.5 % (ref 36.0–46.0)
Hemoglobin: 14.1 g/dL (ref 12.0–15.0)
Lymphocytes Relative: 21.7 % (ref 12.0–46.0)
Lymphs Abs: 1.9 10*3/uL (ref 0.7–4.0)
MCHC: 34.9 g/dL (ref 30.0–36.0)
MCV: 99.2 fl (ref 78.0–100.0)
Monocytes Absolute: 1.4 10*3/uL — ABNORMAL HIGH (ref 0.1–1.0)
Monocytes Relative: 16.8 % — ABNORMAL HIGH (ref 3.0–12.0)
Neutro Abs: 5.2 10*3/uL (ref 1.4–7.7)
Neutrophils Relative %: 61 % (ref 43.0–77.0)
Platelets: 148 10*3/uL — ABNORMAL LOW (ref 150.0–400.0)
RBC: 4.08 Mil/uL (ref 3.87–5.11)
RDW: 12.5 % (ref 11.5–15.5)
WBC: 8.6 10*3/uL (ref 4.0–10.5)

## 2019-01-01 LAB — LIPID PANEL
Cholesterol: 199 mg/dL (ref 0–200)
HDL: 36.9 mg/dL — ABNORMAL LOW (ref >39.00)
NonHDL: 162.08
Total CHOL/HDL Ratio: 5
Triglycerides: 262 mg/dL — ABNORMAL HIGH (ref 0.0–149.0)
VLDL: 52.4 mg/dL — ABNORMAL HIGH (ref 0.0–40.0)

## 2019-01-01 LAB — LDL CHOLESTEROL, DIRECT: Direct LDL: 137 mg/dL

## 2019-01-01 NOTE — Telephone Encounter (Signed)
We may need to make sure she has extra time

## 2019-01-01 NOTE — Telephone Encounter (Signed)
Patient's husband Sonia Side) expressed concerns about his wife and wanted to let her PCP know since he believes his wife would not tell her and omit information. He stated that she has not been doing things she previously enjoyed such as going out of the house for take out/dinner. Sonia Side stated that his wife feels like she is being watched even in their own home and he also mentioned her having to stop walking from their family room to their bedroom to take a break before finishing the rest of the walk.   If you have any questions please reach out to [place] at [phone].

## 2019-01-01 NOTE — Telephone Encounter (Signed)
Please see below. I scheduled a virtual for tomorrow morning

## 2019-01-02 ENCOUNTER — Encounter: Payer: Self-pay | Admitting: Family Medicine

## 2019-01-02 ENCOUNTER — Ambulatory Visit (INDEPENDENT_AMBULATORY_CARE_PROVIDER_SITE_OTHER): Payer: Medicare Other | Admitting: Family Medicine

## 2019-01-02 DIAGNOSIS — F419 Anxiety disorder, unspecified: Secondary | ICD-10-CM | POA: Diagnosis not present

## 2019-01-02 DIAGNOSIS — I2510 Atherosclerotic heart disease of native coronary artery without angina pectoris: Secondary | ICD-10-CM | POA: Diagnosis not present

## 2019-01-02 DIAGNOSIS — F329 Major depressive disorder, single episode, unspecified: Secondary | ICD-10-CM

## 2019-01-02 DIAGNOSIS — R296 Repeated falls: Secondary | ICD-10-CM | POA: Diagnosis not present

## 2019-01-02 DIAGNOSIS — G629 Polyneuropathy, unspecified: Secondary | ICD-10-CM | POA: Diagnosis not present

## 2019-01-02 LAB — THYROID PANEL WITH TSH
Free Thyroxine Index: 4 — ABNORMAL HIGH (ref 1.4–3.8)
T3 Uptake: 33 % (ref 22–35)
T4, Total: 12 ug/dL — ABNORMAL HIGH (ref 5.1–11.9)
TSH: 0.21 mIU/L — ABNORMAL LOW (ref 0.40–4.50)

## 2019-01-02 NOTE — Progress Notes (Signed)
Virtual Visit via Video Note  I connected with Breanna Hernandez on 01/02/19 at  9:40 AM EDT by a video enabled telemedicine application and verified that I am speaking with the correct person using two identifiers.  Location: Patient: home with her husband Provider: office    I discussed the limitations of evaluation and management by telemedicine and the availability of in person appointments. The patient expressed understanding and agreed to proceed.  History of Present Illness: Pt is home in bed.  Original app was for f/u of her depression / anxiety.   She does not want to increase her meds or go to counseling---   Her husband left a message saying she was paranoid but she nor he brought it up at the office visit He was more concerned about her balance and dec strength.   He is asking for pt / further evaluation  She also has been nauseous in the AM    Observations/Objective: No vitals obtained--- pt husband tried to get her bp but she refused  Pt denies fever  Pt is in bed-- lethargic Otherwise in NAD Assessment and Plan: 1. Falls frequently Refer to neuro and pt for further eval - Ambulatory referral to Neurology - Ambulatory referral to Physical Therapy  2. Neuropathy Long standing---- refer to neuro  - Ambulatory referral to Neurology - Ambulatory referral to Physical Therapy  3. Anxiety and depression Slightly worse--- pt wishes to do nothing right now    Follow Up Instructions:    I discussed the assessment and treatment plan with the patient. The patient was provided an opportunity to ask questions and all were answered. The patient agreed with the plan and demonstrated an understanding of the instructions.   The patient was advised to call back or seek an in-person evaluation if the symptoms worsen or if the condition fails to improve as anticipated.  I provided 25 minutes of non-face-to-face time during this encounter.   Ann Held, DO

## 2019-01-02 NOTE — Progress Notes (Deleted)
Patient ID: Breanna Hernandez, female    DOB: 1945/07/09  Age: 73 y.o. MRN: [id number]    Subjective:  Subjective  HPI Breanna Hernandez presents for ***  Review of Systems  History Past Medical History:  Diagnosis Date  . Arthritis   . Atrial fibrillation ([place])    h/o post-op 04/2010  . Breast cancer ([place])    x 3 cancer - radiation right x2 ''00,'05, left '07 mammosite radiation   . Diverticulosis   . Dysrhythmia    a fib  . Fatty liver 01/10/07  . GERD (gastroesophageal reflux disease)    hx of  . Headache    migraines   . 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 HTN (hypertension)   . Hyperlipidemia   . Hyperplastic colon polyp   . Hypothyroidism   . IBS (irritable bowel syndrome)   . Lower extremity neuropathy    occ. affects walking and balance"foot to knees" nondiabetic  . Obesity   . Renal cyst, left   . Thyroid cancer ([place]) 11/2015   surgery Oct 2017    She has a past surgical history that includes Cholecystectomy; Tonsillectomy; Colonoscopy w/ polypectomy; Median sternotomy, extracorporeal circulation, repair of type 1 aortic dissection with  tube graft from  sinotubular junction to take off of innominate artery, distal anastomoses under deep bypothermic circulatory arrest (05/03/2010); Hysteroscopy w/D&C (N/A, 04/13/2014); Robotic assisted total hysterectomy with bilateral salpingo oophorectomy (Bilateral, 05/18/2014); Total thyroidectomy (01/09/2016); Thyroidectomy (N/A, 01/09/2016); Breast lumpectomy (2000); Coronary artery bypass graft (05/03/2010); Joint replacement; and Total hip arthroplasty (Right, 03/15/2017).   Her family history includes Alzheimer's disease in her mother; Aneurysm in her father; Goiter in her mother; Heart attack in her father; Hypertension in her brother and mother; Kidney disease in her brother.She reports that she has never smoked. She has never used smokeless tobacco. She reports that she does not drink alcohol or use drugs.  Current  Outpatient Medications on File Prior to Visit  Medication Sig Dispense Refill  . aspirin EC 81 MG tablet Take 81 mg by mouth daily.    . benazepril (LOTENSIN) 40 MG tablet Take 1 tablet (40 mg total) by mouth daily. 90 tablet 1  . CALCIUM-MAGNESIUM-VITAMIN D PO Take 1 tablet by mouth 2 (two) times daily.     Marland Kitchen desonide (DESOWEN) 0.05 % cream Apply 1 application topically 3 (three) times a week.    . hydrochlorothiazide (MICROZIDE) 12.5 MG capsule Take 1 capsule (12.5 mg total) by mouth daily. 180 capsule 3  . levothyroxine (SYNTHROID) 175 MCG tablet Take 1 tablet (175 mcg total) by mouth daily before breakfast. 90 tablet 3  . LORazepam (ATIVAN) 0.5 MG tablet Take 1 tablet (0.5 mg total) by mouth every 8 (eight) hours as needed for anxiety. 90 tablet 1  . Magnesium Oxide 400 MG CAPS Take 400 mg daily 30 capsule 6  . metoprolol tartrate (LOPRESSOR) 50 MG tablet TAKE 1 TABLET BY MOUTH TWICE A DAY. 180 tablet 3  . Omega-3 Fatty Acids (FISH OIL PO) Take 1 capsule by mouth 2 (two) times daily.    Marland Kitchen OVER THE COUNTER MEDICATION Take 1 tablet by mouth every morning. Product Name: EHT Mind enhancement formula Vitamin d 200 units Vitamin b 1.6 mg Vitamin b-12 10 mcg Selinum 70 mcg Coffee Extract 35 mg Alpha leporic acid 50 mg huperzine-a 50 mcg    . potassium chloride SA (K-DUR) 20 MEQ tablet Take 1 tablet (20 mEq total) by mouth daily. (Patient not taking:  Reported on 12/24/2018) 30 tablet 11  . sertraline (ZOLOFT) 50 MG tablet Take 1 tablet (50 mg total) by mouth daily. 30 tablet 3  . [DISCONTINUED] amLODipine-benazepril (LOTREL) 5-20 MG per capsule Take 1 capsule by mouth daily.      . [DISCONTINUED] escitalopram (LEXAPRO) 10 MG tablet Take 10 mg by mouth daily.      . [DISCONTINUED] lisinopril (PRINIVIL,ZESTRIL) 20 MG tablet Take 20 mg by mouth daily.       No current facility-administered medications on file prior to visit.      Objective:  Objective  Physical Exam There were no vitals taken  for this visit. Wt Readings from Last 3 Encounters:  12/24/18 237 lb (107.5 kg)  08/08/18 244 lb 11.2 oz (111 kg)  07/30/18 242 lb 9.6 oz (110 kg)     Lab Results  Component Value Date   WBC 8.6 01/01/2019   HGB 14.1 01/01/2019   HCT 40.5 01/01/2019   PLT 148.0 (L) 01/01/2019   GLUCOSE 94 01/01/2019   CHOL 199 01/01/2019   TRIG 262.0 (H) 01/01/2019   HDL 36.90 (L) 01/01/2019   LDLDIRECT 137.0 01/01/2019   LDLCALC 153 (H) 05/09/2017   ALT 27 01/01/2019   AST 31 01/01/2019   NA 140 01/01/2019   K 3.5 01/01/2019   CL 100 01/01/2019   CREATININE 0.78 01/01/2019   BUN 13 01/01/2019   CO2 31 01/01/2019   TSH 0.21 (L) 01/01/2019   INR 2 09/01/2014   HGBA1C 5.8 08/17/2014    US Venous Img Lower Unilateral Right  Result Date: 06/09/2018 CLINICAL DATA:  Right lower extremity pain and edema. History of right total knee revision on 05/07/2018. History of breast, uterine and thyroid cancer. Evaluate for DVT. EXAM: RIGHT LOWER EXTREMITY VENOUS DOPPLER ULTRASOUND TECHNIQUE: Gray-scale sonography with graded compression, as well as color Doppler and duplex ultrasound were performed to evaluate the lower extremity deep venous systems from the level of the common femoral vein and including the common femoral, femoral, profunda femoral, popliteal and calf veins including the posterior tibial, peroneal and gastrocnemius veins when visible. The superficial great saphenous vein was also interrogated. Spectral Doppler was utilized to evaluate flow at rest and with distal augmentation maneuvers in the common femoral, femoral and popliteal veins. COMPARISON:  None. FINDINGS: Contralateral Common Femoral Vein: Respiratory phasicity is normal and symmetric with the symptomatic side. No evidence of thrombus. Normal compressibility. Common Femoral Vein: No evidence of thrombus. Normal compressibility, respiratory phasicity and response to augmentation. Saphenofemoral Junction: No evidence of thrombus. Normal  compressibility and flow on color Doppler imaging. Profunda Femoral Vein: No evidence of thrombus. Normal compressibility and flow on color Doppler imaging. Femoral Vein: No evidence of thrombus. Normal compressibility, respiratory phasicity and response to augmentation. Popliteal Vein: No evidence of thrombus. Normal compressibility, respiratory phasicity and response to augmentation. Calf Veins: No evidence of thrombus. Normal compressibility and flow on color Doppler imaging. Superficial Great Saphenous Vein: No evidence of thrombus. Normal compressibility and flow on color Doppler imaging. Venous Reflux:  None. Other Findings: There is hypoechoic occlusive thrombus within both paired right gastrocnemius veins (images 29 through 33). IMPRESSION: 1. No evidence of DVT within the right lower extremity. 2. Examination is positive for short segment occlusive superficial thrombophlebitis involving both paired right gastrocnemius veins. There is no extension of this short-segment occlusive SVT to the deep venous system of the right lower extremity. Electronically Signed   By: Sandi Mariscal M.D.   On: 06/09/2018 16:39  Dg Foot Complete Right  Result Date: 06/09/2018 CLINICAL DATA:  RIGHT foot pain at dorsal surface for 4 days, no known injury, history of lower extremity neuropathy EXAM: RIGHT FOOT COMPLETE - 3+ VIEW COMPARISON:  None FINDINGS: Osseous demineralization. Mild degenerative changes at the naviculocuneiform joint. Joint spaces otherwise preserved. No acute fracture, dislocation, or bone destruction. Large plantar calcaneal spur. Significant soft tissue swelling at lower leg and foot, especially at the dorsum of the foot overlying the metatarsals. IMPRESSION: Extensive soft tissue swelling without acute bony abnormalities. Electronically Signed   By: Lavonia Dana M.D.   On: 06/09/2018 16:43     Assessment & Plan:  Plan  I am having Charlestine Creegan maintain her CALCIUM-MAGNESIUM-VITAMIN D PO, OVER THE  COUNTER MEDICATION, Omega-3 Fatty Acids (FISH OIL PO), desonide, aspirin EC, metoprolol tartrate, Magnesium Oxide, levothyroxine, potassium chloride SA, benazepril, hydrochlorothiazide, sertraline, and LORazepam.  No orders of the defined types were placed in this encounter.   Problem List Items Addressed This Visit    None      Follow-up: No follow-ups on file.  Ann Held, DO

## 2019-01-02 NOTE — Assessment & Plan Note (Signed)
Pt does not want to inc meds She does not want to see a counselor just yet either F/u after neuro

## 2019-01-04 ENCOUNTER — Other Ambulatory Visit: Payer: Self-pay | Admitting: Family Medicine

## 2019-01-04 DIAGNOSIS — E785 Hyperlipidemia, unspecified: Secondary | ICD-10-CM

## 2019-01-04 DIAGNOSIS — E039 Hypothyroidism, unspecified: Secondary | ICD-10-CM

## 2019-01-05 ENCOUNTER — Encounter: Payer: Self-pay | Admitting: Internal Medicine

## 2019-01-05 ENCOUNTER — Other Ambulatory Visit: Payer: Self-pay | Admitting: *Deleted

## 2019-01-05 DIAGNOSIS — E785 Hyperlipidemia, unspecified: Secondary | ICD-10-CM

## 2019-01-05 MED ORDER — ROSUVASTATIN CALCIUM 10 MG PO TABS: 10.0000 mg | ORAL_TABLET | Freq: Every day | ORAL | 2 refills | Status: DC

## 2019-01-12 ENCOUNTER — Ambulatory Visit: Payer: Medicare Other | Admitting: Physician Assistant

## 2019-01-15 ENCOUNTER — Other Ambulatory Visit: Payer: Self-pay | Admitting: Family Medicine

## 2019-01-15 ENCOUNTER — Encounter: Payer: Self-pay | Admitting: Family Medicine

## 2019-01-15 DIAGNOSIS — G629 Polyneuropathy, unspecified: Secondary | ICD-10-CM

## 2019-01-15 MED ORDER — GABAPENTIN 100 MG PO CAPS: 100.0000 mg | ORAL_CAPSULE | Freq: Three times a day (TID) | ORAL | 3 refills | Status: DC

## 2019-01-15 NOTE — Telephone Encounter (Signed)
It should be fine at lower dose -- what dose does she have?

## 2019-01-19 NOTE — Telephone Encounter (Signed)
It should be fine at that dose

## 2019-01-21 ENCOUNTER — Encounter: Payer: Medicare Other | Admitting: Family Medicine

## 2019-01-21 ENCOUNTER — Other Ambulatory Visit: Payer: Self-pay

## 2019-01-21 ENCOUNTER — Encounter: Payer: Self-pay | Admitting: Family Medicine

## 2019-01-26 ENCOUNTER — Other Ambulatory Visit: Payer: Self-pay

## 2019-01-26 ENCOUNTER — Ambulatory Visit (INDEPENDENT_AMBULATORY_CARE_PROVIDER_SITE_OTHER): Payer: Medicare Other | Admitting: Family Medicine

## 2019-01-26 ENCOUNTER — Telehealth: Payer: Self-pay | Admitting: Family Medicine

## 2019-01-26 ENCOUNTER — Encounter: Payer: Self-pay | Admitting: Family Medicine

## 2019-01-26 DIAGNOSIS — I2510 Atherosclerotic heart disease of native coronary artery without angina pectoris: Secondary | ICD-10-CM | POA: Diagnosis not present

## 2019-01-26 DIAGNOSIS — F329 Major depressive disorder, single episode, unspecified: Secondary | ICD-10-CM

## 2019-01-26 DIAGNOSIS — F419 Anxiety disorder, unspecified: Secondary | ICD-10-CM | POA: Diagnosis not present

## 2019-01-26 NOTE — Telephone Encounter (Signed)
LVM to schedule "Return in about 3 months (around 04/28/2019) for hypertension, hyperlipidemia, anxiety."

## 2019-01-26 NOTE — Progress Notes (Signed)
Virtual Visit via Video Note  I connected with [name] on 01/26/19 at  8:20 AM EDT by a video enabled telemedicine application and verified that I am speaking with the correct person using two identifiers.  Location: Patient: home  Provider: office    I discussed the limitations of evaluation and management by telemedicine and the availability of in person appointments. The patient expressed understanding and agreed to proceed.  History of Present Illness: Pt is home f/u on anxiety.  She is doing much better with the zoloft---  She is having some nausea in the am but that was occurring before she started zoloft    Her anxiety is much better.    Observations/Objective: No vitals obtained  Pt is in NAD  Pt much more animated and cheerful today  Assessment and Plan: 1. Anxiety and depression con't zoloft  F/u 3 months or sooner prn   Follow Up Instructions:    I discussed the assessment and treatment plan with the patient. The patient was provided an opportunity to ask questions and all were answered. The patient agreed with the plan and demonstrated an understanding of the instructions.   The patient was advised to call back or seek an in-person evaluation if the symptoms worsen or if the condition fails to improve as anticipated.  I provided 20 minutes of non-face-to-face time during this encounter.   Ann Held, DO

## 2019-01-26 NOTE — Assessment & Plan Note (Signed)
Much better  con't zoloft rto 3 months f/u

## 2019-02-03 ENCOUNTER — Institutional Professional Consult (permissible substitution): Payer: Medicare Other | Admitting: Cardiology

## 2019-02-03 NOTE — Progress Notes (Deleted)
Electrophysiology Office Note   Date:  02/03/2019   ID:  Breanna Hernandez, DOB November 26, 1945, MRN [id number]  PCP:  Carollee Herter, Alferd Apa, DO  Cardiologist:  *** Primary Electrophysiologist: *** Breanna Catala Meredith Leeds, MD    Chief Complaint: ***   History of Present Illness: Breanna Hernandez is a 73 y.o. female who is being seen today for the evaluation of *** at the request of Almyra Deforest, [place]. Presenting today for electrophysiology evaluation.    Today, she denies*** symptoms of palpitations, chest pain, shortness of breath, orthopnea, PND, lower extremity edema, claudication, dizziness, presyncope, syncope, bleeding, or neurologic sequela. The patient is tolerating medications without difficulties.    Past Medical History:  Diagnosis Date  . Arthritis   . Atrial fibrillation ([place])    h/o post-op 04/2010  . Breast cancer ([place])    x 3 cancer - radiation right x2 ''00,'05, left '07 mammosite radiation   . Diverticulosis   . Dysrhythmia    a fib  . Fatty liver 01/10/07  . GERD (gastroesophageal reflux disease)    hx of  . Headache    migraines   . 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 HTN (hypertension)   . Hyperlipidemia   . Hyperplastic colon polyp   . Hypothyroidism   . IBS (irritable bowel syndrome)   . Lower extremity neuropathy    occ. affects walking and balance"foot to knees" nondiabetic  . Obesity   . Renal cyst, left   . Thyroid cancer ([place]) 11/2015   surgery Oct 2017   Past Surgical History:  Procedure Laterality Date  . BREAST LUMPECTOMY  2000   Right x2, Left x1  . CHOLECYSTECTOMY    . COLONOSCOPY W/ POLYPECTOMY    . CORONARY ARTERY BYPASS GRAFT  05/03/2010   CABG X1, RCA -Dr Roxan Hockey (with type 1 aortic dissection)  . HYSTEROSCOPY W/D&C N/A 04/13/2014   Procedure: DILATATION AND CURETTAGE /HYSTEROSCOPY Polypectomy;  Surgeon: Maeola Sarah. Landry Mellow, MD;  Location: [facility];  Service: Gynecology;  Laterality: N/A;  Possible Polypectomy  . JOINT REPLACEMENT      Right total hip 03-15-17 Dr. Ninfa Linden  . Median sternotomy, extracorporeal circulation, repair of type 1 aortic dissection with  tube graft from  sinotubular junction to take off of innominate artery, distal anastomoses under deep bypothermic circulatory arrest  05/03/2010   Dr Roxan Hockey  . ROBOTIC ASSISTED TOTAL HYSTERECTOMY WITH BILATERAL SALPINGO OOPHERECTOMY Bilateral 05/18/2014   Procedure: ROBOTIC ASSISTED TOTAL HYSTERECTOMY WITH BILATERAL SALPINGO OOPHORECTOMY SENTINAL LYMPH NODE MAPPING ;  Surgeon: Everitt Amber, MD;  Location: [facility];  Service: Gynecology;  Laterality: Bilateral;  . THYROIDECTOMY N/A 01/09/2016   Procedure: TOTAL THYROIDECTOMY;  Surgeon: Armandina Gemma, MD;  Location: [facility];  Service: General;  Laterality: N/A;  . TONSILLECTOMY    . TOTAL HIP ARTHROPLASTY Right 03/15/2017   Procedure: RIGHT TOTAL HIP ARTHROPLASTY ANTERIOR APPROACH;  Surgeon: Mcarthur Rossetti, MD;  Location: [facility];  Service: Orthopedics;  Laterality: Right;  . TOTAL THYROIDECTOMY  01/09/2016     Current Outpatient Medications  Medication Sig Dispense Refill  . aspirin EC 81 MG tablet Take 81 mg by mouth daily.    . benazepril (LOTENSIN) 40 MG tablet Take 1 tablet (40 mg total) by mouth daily. 90 tablet 1  . CALCIUM-MAGNESIUM-VITAMIN D PO Take 1 tablet by mouth 2 (two) times daily.     Marland Kitchen desonide (DESOWEN) 0.05 % cream Apply 1 application topically 3 (three) times a week.    Marland Kitchen  gabapentin (NEURONTIN) 100 MG capsule Take 1 capsule (100 mg total) by mouth 3 (three) times daily. 90 capsule 3  . hydrochlorothiazide (MICROZIDE) 12.5 MG capsule Take 1 capsule (12.5 mg total) by mouth daily. 180 capsule 3  . levothyroxine (SYNTHROID) 175 MCG tablet Take 1 tablet (175 mcg total) by mouth daily before breakfast. 90 tablet 3  . LORazepam (ATIVAN) 0.5 MG tablet Take 1 tablet (0.5 mg total) by mouth every 8 (eight) hours as needed for anxiety. 90 tablet 1  . Magnesium Oxide 400 MG CAPS Take 400 mg daily 30 capsule  6  . metoprolol tartrate (LOPRESSOR) 50 MG tablet TAKE 1 TABLET BY MOUTH TWICE A DAY. 180 tablet 3  . Omega-3 Fatty Acids (FISH OIL PO) Take 1 capsule by mouth 2 (two) times daily.    Marland Kitchen OVER THE COUNTER MEDICATION Take 1 tablet by mouth every morning. Product Name: EHT Mind enhancement formula Vitamin d 200 units Vitamin b 1.6 mg Vitamin b-12 10 mcg Selinum 70 mcg Coffee Extract 35 mg Alpha leporic acid 50 mg huperzine-a 50 mcg    . potassium chloride SA (K-DUR) 20 MEQ tablet Take 1 tablet (20 mEq total) by mouth daily. 30 tablet 11  . rosuvastatin (CRESTOR) 10 MG tablet Take 1 tablet (10 mg total) by mouth daily. 30 tablet 2  . sertraline (ZOLOFT) 50 MG tablet Take 1 tablet (50 mg total) by mouth daily. 30 tablet 3   No current facility-administered medications for this visit.     Allergies:   Patient has no known allergies.   Social History:  The patient  reports that she has never smoked. She has never used smokeless tobacco. She reports that she does not drink alcohol or use drugs.   Family History:  The patient's ***family history includes Alzheimer's disease in her mother; Aneurysm in her father; Goiter in her mother; Heart attack in her father; Hypertension in her brother and mother; Kidney disease in her brother.    ROS:  Please see the history of present illness.   Otherwise, review of systems is positive for ***.   All other systems are reviewed and negative.    PHYSICAL EXAM: VS:  There were no vitals taken for this visit. , BMI There is no height or weight on file to calculate BMI. GEN: Well nourished, well developed, in no acute distress  HEENT: normal  Neck: no JVD, carotid bruits, or masses Cardiac: ***RRR; no murmurs, rubs, or gallops,no edema  Respiratory:  clear to auscultation bilaterally, normal work of breathing GI: soft, nontender, nondistended, + BS MS: no deformity or atrophy  Skin: warm and dry, ***device pocket is well healed Neuro:  Strength and  sensation are intact Psych: euthymic mood, full affect  EKG:  EKG {ACTION; IS/IS [id number] ordered today. Personal review of the ekg ordered *** shows ***  ***Device interrogation is reviewed today in detail.  See PaceArt for details.   Recent Labs: 01/01/2019: ALT 27; BUN 13; Creatinine, Ser 0.78; Hemoglobin 14.1; Platelets 148.0; Potassium 3.5; Sodium 140; TSH 0.21    Lipid Panel     Component Value Date/Time   CHOL 199 01/01/2019 1416   CHOL 263 (H) 08/17/2014 1134   TRIG 262.0 (H) 01/01/2019 1416   TRIG 325 (H) 08/17/2014 1134   TRIG 293 (HH) 03/29/2006 1033   HDL 36.90 (L) 01/01/2019 1416   HDL 39 (L) 08/17/2014 1134   CHOLHDL 5 01/01/2019 1416   VLDL 52.4 (H) 01/01/2019 1416   LDLCALC 153 (H)  05/09/2017 1142   LDLCALC 159 (H) 08/17/2014 1134   LDLDIRECT 137.0 01/01/2019 1416     Wt Readings from Last 3 Encounters:  12/24/18 237 lb (107.5 kg)  08/08/18 244 lb 11.2 oz (111 kg)  07/30/18 242 lb 9.6 oz (110 kg)      Other studies Reviewed: Additional studies/ records that were reviewed today include: Monitor 7*13/20 personally reviewed  Review of the above records today demonstrates:   Dominant rhythm is normal sinus and mild sinus bradycardia. The average heart rate is only 55 bpm with a minimum of 41 bpm.  There are frequent PACs, atrial couplets and brief bursts of nonsustained atrial tachycardia up to 12 beats in duration. Over 600 episodes of brief atrial tachycardia were recorded in 3 days. 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  There is no true atrial fibrillation.  No evidence of meaningful ventricular rhythm abnormalities.  No significant pauses are seen, but there is a tendency to sinus bradycardia and first-degree AV block.  Symptoms correlate well with episodes of ectopic atrial tachycardia.   ASSESSMENT AND PLAN:  1.  ***    Current medicines are reviewed at length with the patient today.   The patient {ACTIONS;  HAS/DOES NOT HAVE:19233} concerns regarding her medicines.  The following changes were made today:  {NONE DEFAULTED:18576::"none"}  Labs/ tests ordered today include: *** No orders of the defined types were placed in this encounter.    Disposition:   FU with Ardyn [facility]ge {gen number [id number] {Days to years:10300}  Signed, Mare Ludtke Meredith Leeds, MD  02/03/2019 8:41 AM     [facility] [address] [place] [address] [phone] (office) [phone] (fax)

## 2019-02-06 ENCOUNTER — Encounter

## 2019-02-11 ENCOUNTER — Other Ambulatory Visit: Payer: Self-pay | Admitting: Cardiovascular Disease

## 2019-02-11 ENCOUNTER — Encounter: Payer: Self-pay | Admitting: Family Medicine

## 2019-02-19 ENCOUNTER — Ambulatory Visit (INDEPENDENT_AMBULATORY_CARE_PROVIDER_SITE_OTHER): Payer: Medicare Other | Admitting: Cardiology

## 2019-02-19 ENCOUNTER — Encounter: Payer: Self-pay | Admitting: Cardiology

## 2019-02-19 ENCOUNTER — Other Ambulatory Visit: Payer: Self-pay

## 2019-02-19 VITALS — BP 134/62 | HR 60 | Ht 64.0 in | Wt 239.4 lb

## 2019-02-19 DIAGNOSIS — I471 Supraventricular tachycardia: Secondary | ICD-10-CM

## 2019-02-19 NOTE — Patient Instructions (Signed)

## 2019-02-19 NOTE — Progress Notes (Signed)
Electrophysiology Office Note   Date:  02/19/2019   ID:  Breanna Hernandez, DOB 1946/02/18, MRN [id number]  PCP:  Carollee Herter, Alferd Apa, DO  Cardiologist:  Croitrou/Meng Primary Electrophysiologist:  Gayland Nicol Meredith Leeds, MD    Chief Complaint: Palpitations   History of Present Illness: Breanna Hernandez is a 73 y.o. female who is being seen today for the evaluation of atrial tachycardia at the request of Almyra Deforest, [place]. Presenting today for electrophysiology evaluation.  She has a history significant for aortic dissection in 2012, one-vessel CABG with saphenous vein to the RCA, hypertension, hypothyroidism.  She was having recurrent palpitations and wore a ZIO monitor July 2020.  This showed brief episodes of atrial tachycardia, though no atrial fibrillation was seen.  She had been bradycardic and thus her beta-blocker was not increased.  She has been put on Zoloft by her primary physician which has greatly improved her palpitation symptoms.  She does continue to have short episodes, but they are not nearly as bothersome.  Her palpitations occurred most often when she was laying flat and have been improved with sitting up.  Today, she denies symptoms of palpitations, chest pain, shortness of breath, orthopnea, PND, lower extremity edema, claudication, dizziness, presyncope, syncope, bleeding, or neurologic sequela. The patient is tolerating medications without difficulties.    Past Medical History:  Diagnosis Date  . Arthritis   . Atrial fibrillation ([place])    h/o post-op 04/2010  . Breast cancer ([place])    x 3 cancer - radiation right x2 ''00,'05, left '07 mammosite radiation   . Diverticulosis   . Dysrhythmia    a fib  . Fatty liver 01/10/07  . GERD (gastroesophageal reflux disease)    hx of  . Headache    migraines   . 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 HTN (hypertension)   . Hyperlipidemia   . Hyperplastic colon polyp   . Hypothyroidism   . IBS (irritable bowel  syndrome)   . Lower extremity neuropathy    occ. affects walking and balance"foot to knees" nondiabetic  . Obesity   . Renal cyst, left   . Thyroid cancer ([place]) 11/2015   surgery Oct 2017   Past Surgical History:  Procedure Laterality Date  . BREAST LUMPECTOMY  2000   Right x2, Left x1  . CHOLECYSTECTOMY    . COLONOSCOPY W/ POLYPECTOMY    . CORONARY ARTERY BYPASS GRAFT  05/03/2010   CABG X1, RCA -Dr Roxan Hockey (with type 1 aortic dissection)  . HYSTEROSCOPY W/D&C N/A 04/13/2014   Procedure: DILATATION AND CURETTAGE /HYSTEROSCOPY Polypectomy;  Surgeon: Maeola Sarah. Landry Mellow, MD;  Location: [facility];  Service: Gynecology;  Laterality: N/A;  Possible Polypectomy  . JOINT REPLACEMENT     Right total hip 03-15-17 Dr. Ninfa Linden  . Median sternotomy, extracorporeal circulation, repair of type 1 aortic dissection with  tube graft from  sinotubular junction to take off of innominate artery, distal anastomoses under deep bypothermic circulatory arrest  05/03/2010   Dr Roxan Hockey  . ROBOTIC ASSISTED TOTAL HYSTERECTOMY WITH BILATERAL SALPINGO OOPHERECTOMY Bilateral 05/18/2014   Procedure: ROBOTIC ASSISTED TOTAL HYSTERECTOMY WITH BILATERAL SALPINGO OOPHORECTOMY SENTINAL LYMPH NODE MAPPING ;  Surgeon: Everitt Amber, MD;  Location: [facility];  Service: Gynecology;  Laterality: Bilateral;  . THYROIDECTOMY N/A 01/09/2016   Procedure: TOTAL THYROIDECTOMY;  Surgeon: Armandina Gemma, MD;  Location: [facility];  Service: General;  Laterality: N/A;  . TONSILLECTOMY    . TOTAL HIP ARTHROPLASTY Right 03/15/2017  Procedure: RIGHT TOTAL HIP ARTHROPLASTY ANTERIOR APPROACH;  Surgeon: Mcarthur Rossetti, MD;  Location: [facility];  Service: Orthopedics;  Laterality: Right;  . TOTAL THYROIDECTOMY  01/09/2016     Current Outpatient Medications  Medication Sig Dispense Refill  . aspirin EC 81 MG tablet Take 81 mg by mouth daily.    . benazepril (LOTENSIN) 40 MG tablet TAKE 1 TABLET BY MOUTH EVERY DAY 90 tablet 1  . CALCIUM-MAGNESIUM-VITAMIN  D PO Take 1 tablet by mouth 2 (two) times daily.     Marland Kitchen desonide (DESOWEN) 0.05 % cream Apply 1 application topically 3 (three) times a week.    . hydrochlorothiazide (MICROZIDE) 12.5 MG capsule Take 1 capsule (12.5 mg total) by mouth daily. 180 capsule 3  . levothyroxine (SYNTHROID) 175 MCG tablet Take 1 tablet (175 mcg total) by mouth daily before breakfast. 90 tablet 3  . LORazepam (ATIVAN) 0.5 MG tablet Take 1 tablet (0.5 mg total) by mouth every 8 (eight) hours as needed for anxiety. 90 tablet 1  . Magnesium Oxide 400 MG CAPS Take 400 mg daily 30 capsule 6  . metoprolol tartrate (LOPRESSOR) 50 MG tablet TAKE 1 TABLET BY MOUTH TWICE A DAY. 180 tablet 3  . Omega-3 Fatty Acids (FISH OIL PO) Take 1 capsule by mouth 2 (two) times daily.    Marland Kitchen OVER THE COUNTER MEDICATION Take 1 tablet by mouth every morning. Product Name: EHT Mind enhancement formula Vitamin d 200 units Vitamin b 1.6 mg Vitamin b-12 10 mcg Selinum 70 mcg Coffee Extract 35 mg Alpha leporic acid 50 mg huperzine-a 50 mcg    . rosuvastatin (CRESTOR) 10 MG tablet Take 1 tablet (10 mg total) by mouth daily. 30 tablet 2  . sertraline (ZOLOFT) 50 MG tablet Take 1 tablet (50 mg total) by mouth daily. 30 tablet 3   No current facility-administered medications for this visit.     Allergies:   Patient has no known allergies.   Social History:  The patient  reports that she has never smoked. She has never used smokeless tobacco. She reports that she does not drink alcohol or use drugs.   Family History:  The patient's family history includes Alzheimer's disease in her mother; Aneurysm in her father; Goiter in her mother; Heart attack in her father; Hypertension in her brother and mother; Kidney disease in her brother.    ROS:  Please see the history of present illness.   Otherwise, review of systems is positive for none.   All other systems are reviewed and negative.    PHYSICAL EXAM: VS:  BP 134/62   Pulse 60   Ht 5\' 4"  (1.626 m)    Wt 239 lb 6.4 oz (108.6 kg)   SpO2 95%   BMI 41.09 kg/m  , BMI Body mass index is 41.09 kg/m. GEN: Well nourished, well developed, in no acute distress  HEENT: normal  Neck: no JVD, carotid bruits, or masses Cardiac: RRR; no murmurs, rubs, or gallops,no edema  Respiratory:  clear to auscultation bilaterally, normal work of breathing GI: soft, nontender, nondistended, + BS MS: no deformity or atrophy  Skin: warm and dry Neuro:  Strength and sensation are intact Psych: euthymic mood, full affect  EKG:  EKG is ordered today. Personal review of the ekg ordered shows sinus rhythm, nonspecific T wave abnormality, rate 60  Recent Labs: 01/01/2019: ALT 27; BUN 13; Creatinine, Ser 0.78; Hemoglobin 14.1; Platelets 148.0; Potassium 3.5; Sodium 140; TSH 0.21    Lipid Panel  Component Value Date/Time   CHOL 199 01/01/2019 1416   CHOL 263 (H) 08/17/2014 1134   TRIG 262.0 (H) 01/01/2019 1416   TRIG 325 (H) 08/17/2014 1134   TRIG 293 (HH) 03/29/2006 1033   HDL 36.90 (L) 01/01/2019 1416   HDL 39 (L) 08/17/2014 1134   CHOLHDL 5 01/01/2019 1416   VLDL 52.4 (H) 01/01/2019 1416   LDLCALC 153 (H) 05/09/2017 1142   LDLCALC 159 (H) 08/17/2014 1134   LDLDIRECT 137.0 01/01/2019 1416     Wt Readings from Last 3 Encounters:  02/19/19 239 lb 6.4 oz (108.6 kg)  12/24/18 237 lb (107.5 kg)  08/08/18 244 lb 11.2 oz (111 kg)      Other studies Reviewed: Additional studies/ records that were reviewed today include: Monitor 10/20/18 personally reviewed Review of the above records today demonstrates:   Dominant rhythm is normal sinus and mild sinus bradycardia. The average heart rate is only 55 bpm with a minimum of 41 bpm.  There are frequent PACs, atrial couplets and brief bursts of nonsustained atrial tachycardia up to 12 beats in duration. Over 600 episodes of brief atrial tachycardia were recorded in 3 days. 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  There is no true atrial  fibrillation.  No evidence of meaningful ventricular rhythm abnormalities.  No significant pauses are seen, but there is a tendency to sinus bradycardia and first-degree AV block.  Symptoms correlate well with episodes of ectopic atrial tachycardia.  2018 Spect  Nuclear stress EF: 83%. The left ventricular ejection fraction is hyperdynamic (>65%).  The study is normal.  This is a low risk study.  ASSESSMENT AND PLAN:  1.  Atrial tachycardia: Currently on metoprolol.  She is recently been started on Zoloft which is greatly improved her palpitation symptoms.  She is continued to have short episodes, but nothing prolonged.  She is comfortable with her symptoms.  No changes at this time.  If she does continue to have palpitations, Multaq may be an option.  2.  History of aortic dissection: Stable on last CT scan.  Plan per primary cardiology.  3.  Postoperative atrial fibrillation: No obvious recurrence.  No changes.  4.  Hypertension: Mildly elevated today but has been well controlled in the past.  No changes.   Current medicines are reviewed at length with the patient today.   The patient does not have concerns regarding her medicines.  The following changes were made today:  none  Labs/ tests ordered today include:  Orders Placed This Encounter  Procedures  . EKG 12-Lead   Case discussed with referring  Disposition:   FU with Nissim Fleischer 6 months  Signed, Reyaansh Merlo Meredith Leeds, MD  02/19/2019 9:20 AM     [facility] [address] [place] [address] [phone] (office) [phone] (fax)

## 2019-02-19 NOTE — Progress Notes (Signed)
Thanks, Will 

## 2019-03-09 ENCOUNTER — Other Ambulatory Visit: Payer: Medicare Other

## 2019-03-23 NOTE — Progress Notes (Signed)
This encounter was created in error - please disregard.

## 2019-03-29 ENCOUNTER — Other Ambulatory Visit: Payer: Self-pay | Admitting: Family Medicine

## 2019-03-29 DIAGNOSIS — E785 Hyperlipidemia, unspecified: Secondary | ICD-10-CM

## 2019-04-01 ENCOUNTER — Other Ambulatory Visit: Payer: Self-pay | Admitting: Family Medicine

## 2019-04-01 DIAGNOSIS — F419 Anxiety disorder, unspecified: Secondary | ICD-10-CM

## 2019-04-01 NOTE — Telephone Encounter (Signed)
Requesting: Ativan Contract: 05/09/2017 UDS: 05/09/2017 Last OV: 01/26/2019 Next OV: N/A Last Refill: 12/25/2018, #90--1 RF  Database:   Please advise

## 2019-04-02 ENCOUNTER — Other Ambulatory Visit: Payer: Self-pay

## 2019-04-02 MED ORDER — METOPROLOL TARTRATE 50 MG PO TABS: ORAL_TABLET | ORAL | 3 refills | Status: AC

## 2019-04-06 ENCOUNTER — Other Ambulatory Visit: Payer: Self-pay | Admitting: Family Medicine

## 2019-04-06 DIAGNOSIS — E785 Hyperlipidemia, unspecified: Secondary | ICD-10-CM

## 2019-05-20 NOTE — Progress Notes (Signed)
Virtual Visit via Audio Note  I connected with patient on 05/21/19 at  3:15 PM EST by audio enabled telemedicine application and verified that I am speaking with the correct person using two identifiers.   THIS ENCOUNTER IS A VIRTUAL VISIT DUE TO COVID-19 - PATIENT WAS NOT SEEN IN THE OFFICE. PATIENT HAS CONSENTED TO VIRTUAL VISIT / TELEMEDICINE VISIT   Location of patient: home  Location of provider: office  I discussed the limitations of evaluation and management by telemedicine and the availability of in person appointments. The patient expressed understanding and agreed to proceed.   Subjective:   Breanna Hernandez is a 74 y.o. female who presents for Medicare Annual (Subsequent) preventive examination.  Review of Systems:  Home Safety/Smoke Alarms: Feels safe in home. Smoke alarms in place.  Lives w/ husband in 3 story home. Tries to avoid stairs.   Female:   Mammo- 11/29/17. Pt declines today due to pandemic.     Dexa scan- 05/24/16  Pt declines today due to pandemic.     CCS- 02/01/10 w/ Dr.Brodie.    Objective:     Vitals: Unable to assess. This visit is enabled though telemedicine due to Covid 19.   Advanced Directives 05/21/2019 06/09/2018 03/15/2017 03/14/2017 01/30/2017 05/10/2016 01/10/2016  Does Patient Have a Medical Advance Directive? No No No No No Yes Yes  Type of Advance Directive - - - - - Press photographer;Living will [place];Living will  Does patient want to make changes to medical advance directive? No - Patient declined - - - - - No - Patient declined  Copy of [place] in Chart? - - - - - - No - copy requested  Would patient like information on creating a medical advance directive? - No - Patient declined No - Patient declined No - Patient declined No - Patient declined - -    Tobacco Social History   Tobacco Use  Smoking Status Never Smoker  Smokeless Tobacco Never Used     Counseling given: Not  Answered   Clinical Intake: Pain : No/denies pain     Past Medical History:  Diagnosis Date  . Arthritis   . Atrial fibrillation ([place])    h/o post-op 04/2010  . Breast cancer ([place])    x 3 cancer - radiation right x2 ''00,'05, left '07 mammosite radiation   . Diverticulosis   . Dysrhythmia    a fib  . Fatty liver 01/10/07  . GERD (gastroesophageal reflux disease)    hx of  . Headache    migraines   . 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 HTN (hypertension)   . Hyperlipidemia   . Hyperplastic colon polyp   . Hypothyroidism   . IBS (irritable bowel syndrome)   . Lower extremity neuropathy    occ. affects walking and balance"foot to knees" nondiabetic  . Obesity   . Renal cyst, left   . Thyroid cancer ([place]) 11/2015   surgery Oct 2017   Past Surgical History:  Procedure Laterality Date  . BREAST LUMPECTOMY  2000   Right x2, Left x1  . CHOLECYSTECTOMY    . COLONOSCOPY W/ POLYPECTOMY    . CORONARY ARTERY BYPASS GRAFT  05/03/2010   CABG X1, RCA -Dr Roxan Hockey (with type 1 aortic dissection)  . HYSTEROSCOPY WITH D & C N/A 04/13/2014   Procedure: DILATATION AND CURETTAGE /HYSTEROSCOPY Polypectomy;  Surgeon: Maeola Sarah. Landry Mellow, MD;  Location: [facility];  Service: Gynecology;  Laterality: N/A;  Possible Polypectomy  . JOINT REPLACEMENT     Right total hip 03-15-17 Dr. Ninfa Linden  . Median sternotomy, extracorporeal circulation, repair of type 1 aortic dissection with  tube graft from  sinotubular junction to take off of innominate artery, distal anastomoses under deep bypothermic circulatory arrest  05/03/2010   Dr Roxan Hockey  . ROBOTIC ASSISTED TOTAL HYSTERECTOMY WITH BILATERAL SALPINGO OOPHERECTOMY Bilateral 05/18/2014   Procedure: ROBOTIC ASSISTED TOTAL HYSTERECTOMY WITH BILATERAL SALPINGO OOPHORECTOMY SENTINAL LYMPH NODE MAPPING ;  Surgeon: Everitt Amber, MD;  Location: [facility];  Service: Gynecology;  Laterality: Bilateral;  . THYROIDECTOMY N/A 01/09/2016   Procedure: TOTAL  THYROIDECTOMY;  Surgeon: Armandina Gemma, MD;  Location: [facility];  Service: General;  Laterality: N/A;  . TONSILLECTOMY    . TOTAL HIP ARTHROPLASTY Right 03/15/2017   Procedure: RIGHT TOTAL HIP ARTHROPLASTY ANTERIOR APPROACH;  Surgeon: Mcarthur Rossetti, MD;  Location: [facility];  Service: Orthopedics;  Laterality: Right;  . TOTAL THYROIDECTOMY  01/09/2016   Family History  Problem Relation Age of Onset  . Aneurysm Father        CNS  . Heart attack Father        x2  . Alzheimer's disease Mother   . Hypertension Mother   . Goiter Mother   . Hypertension Brother   . Kidney disease Brother   . Colon cancer Neg Hx    Social History   Socioeconomic History  . Marital status: Married    Spouse name: Not on file  . Number of children: Not on file  . Years of education: Not on file  . Highest education level: Not on file  Occupational History  . Not on file  Tobacco Use  . Smoking status: Never Smoker  . Smokeless tobacco: Never Used  Substance and Sexual Activity  . Alcohol use: No  . Drug use: No  . Sexual activity: Yes  Other Topics Concern  . Not on file  Social History Narrative  . Not on file   Social Determinants of Health   Financial Resource Strain: Low Risk   . Difficulty of Paying Living Expenses: Not hard at all  Food Insecurity:   . Worried About Charity fundraiser in the Last Year: Not on file  . Ran Out of Food in the Last Year: Not on file  Transportation Needs: No Transportation Needs  . Lack of Transportation (Medical): No  . Lack of Transportation (Non-Medical): No  Physical Activity:   . Days of Exercise per Week: Not on file  . Minutes of Exercise per Session: Not on file  Stress:   . Feeling of Stress : Not on file  Social Connections:   . Frequency of Communication with Friends and Family: Not on file  . Frequency of Social Gatherings with Friends and Family: Not on file  . Attends Religious Services: Not on file  . Active Member of Clubs or  Organizations: Not on file  . Attends Archivist Meetings: Not on file  . Marital Status: Not on file    Outpatient Encounter Medications as of 05/21/2019  Medication Sig  . aspirin EC 81 MG tablet Take 81 mg by mouth daily.  . benazepril (LOTENSIN) 40 MG tablet TAKE 1 TABLET BY MOUTH EVERY DAY  . CALCIUM-MAGNESIUM-VITAMIN D PO Take 1 tablet by mouth 2 (two) times daily.   Marland Kitchen desonide (DESOWEN) 0.05 % cream Apply 1 application topically 3 (three) times a week.  . hydrochlorothiazide (MICROZIDE) 12.5 MG  capsule Take 1 capsule (12.5 mg total) by mouth daily.  Marland Kitchen levothyroxine (SYNTHROID) 175 MCG tablet Take 1 tablet (175 mcg total) by mouth daily before breakfast.  . LORazepam (ATIVAN) 0.5 MG tablet TAKE 1 TABLET (0.5 MG TOTAL) BY MOUTH 2 (TWO) TIMES DAILY AS NEEDED FOR ANXIETY.  . metoprolol tartrate (LOPRESSOR) 50 MG tablet TAKE 1 TABLET BY MOUTH TWICE A DAY.  Marland Kitchen Omega-3 Fatty Acids (FISH OIL PO) Take 1 capsule by mouth 2 (two) times daily.  Marland Kitchen OVER THE COUNTER MEDICATION Take 1 tablet by mouth every morning. Product Name: EHT Mind enhancement formula Vitamin d 200 units Vitamin b 1.6 mg Vitamin b-12 10 mcg Selinum 70 mcg Coffee Extract 35 mg Alpha leporic acid 50 mg huperzine-a 50 mcg  . rosuvastatin (CRESTOR) 10 MG tablet TAKE 1 TABLET BY MOUTH EVERY DAY  . sertraline (ZOLOFT) 50 MG tablet TAKE 1 TABLET BY MOUTH EVERY DAY  . Magnesium Oxide 400 MG CAPS Take 400 mg daily (Patient not taking: Reported on 05/21/2019)  . [DISCONTINUED] amLODipine-benazepril (LOTREL) 5-20 MG per capsule Take 1 capsule by mouth daily.    . [DISCONTINUED] escitalopram (LEXAPRO) 10 MG tablet Take 10 mg by mouth daily.    . [DISCONTINUED] lisinopril (PRINIVIL,ZESTRIL) 20 MG tablet Take 20 mg by mouth daily.     No facility-administered encounter medications on file as of 05/21/2019.    Activities of Daily Living In your present state of health, do you have any difficulty performing the following  activities: 05/21/2019  Hearing? N  Vision? N  Difficulty concentrating or making decisions? N  Walking or climbing stairs? N  Dressing or bathing? N  Doing errands, shopping? N  Preparing Food and eating ? N  Using the Toilet? N  In the past six months, have you accidently leaked urine? N  Do you have problems with loss of bowel control? N  Managing your Medications? N  Managing your Finances? N  Housekeeping or managing your Housekeeping? N  Some recent data might be hidden    Patient Care Team: Carollee Herter, Alferd Apa, DO as PCP - General (Family Medicine) Croitoru, Dani Gobble, MD as PCP - Cardiology (Cardiology) Constance Haw, MD as PCP - Electrophysiology (Cardiology) Melrose Nakayama, MD as Consulting Physician (Cardiothoracic Surgery) Christophe Louis, MD as Consulting Physician (Obstetrics and Gynecology) Monna Fam, MD as Consulting Physician (Ophthalmology) Philemon Kingdom, MD as Consulting Physician (Internal Medicine)    Assessment:   This is a routine wellness examination for [place]. Physical assessment deferred to PCP.  Exercise Activities and Dietary recommendations Current Exercise Habits: The patient does not participate in regular exercise at present, Exercise limited by: None identified   Diet (meal preparation, eat out, water intake, caffeinated beverages, dairy products, fruits and vegetables): 24 hr recall Breakfast: ham biscuit (late) Lunch: skipped Dinner:  Chicken and pickled beets.    Goals    . Increase physical activity    . praying daily    . Reduce fast food intake       Fall Risk Fall Risk  05/21/2019 02/21/2018 05/10/2016 08/17/2014  Falls in the past year? 0 0 Yes No  Comment - Emmi Telephone Survey: data to providers prior to load - -  Number falls in past yr: 0 - 1 -  Comment - - slipped off top step at home while unlocking door -  Injury with Fall? 0 - No -  Risk for fall due to : - - Impaired balance/gait -  Follow up  Education provided;Falls prevention discussed - - -   Depression Screen PHQ 2/9 Scores 05/21/2019 05/10/2016 08/17/2014  PHQ - 2 Score 0 2 1  PHQ- 9 Score - 3 -     Cognitive Function Ad8 score reviewed for issues:  Issues making decisions:no  Less interest in hobbies / activities:no  Repeats questions, stories (family complaining):no  Trouble using ordinary gadgets (microwave, computer, phone):no  Forgets the month or year: no  Mismanaging finances: no  Remembering appts:no  Daily problems with thinking and/or memory:no Ad8 score is=0   MMSE - Mini Mental State Exam 05/10/2016  Orientation to time 5  Orientation to Place 5  Registration 3  Attention/ Calculation 5  Recall 3  Language- name 2 objects 2  Language- repeat 1  Language- follow 3 step command 3  Language- read & follow direction 1  Write a sentence 1  Copy design 1  Total score 30        Immunization History  Administered Date(s) Administered  . Influenza Split 04/17/2011  . Pneumococcal Conjugate-13 05/09/2017    Screening Tests Health Maintenance  Topic Date Due  . TETANUS/TDAP  03[birth year]14/1965  . PNA vac Low Risk Adult (2 of 2 - PPSV23) 05/09/2018  . INFLUENZA VACCINE  01/10/2024 (Originally 11/08/2018)  . COLONOSCOPY  02/02/2020  . DEXA SCAN  Completed  . Hepatitis C Screening  Completed      Plan:  Please schedule your next medicare wellness visit with me in 1 yr.  Continue to eat heart healthy diet (full of fruits, vegetables, whole grains, lean protein, water--limit salt, fat, and sugar intake) and increase physical activity as tolerated.  Continue doing brain stimulating activities (puzzles, reading, adult coloring books, staying active) to keep memory sharp.    I have personally reviewed and noted the following in the patient's chart:   . Medical and social history . Use of alcohol, tobacco or illicit drugs  . Current medications and supplements . Functional ability and  status . Nutritional status . Physical activity . Advanced directives . List of other physicians . Hospitalizations, surgeries, and ER visits in previous 12 months . Vitals . Screenings to include cognitive, depression, and falls . Referrals and appointments  In addition, I have reviewed and discussed with patient certain preventive protocols, quality metrics, and best practice recommendations. A written personalized care plan for preventive services as well as general preventive health recommendations were provided to patient.     Shela Nevin, [place]  05/21/2019

## 2019-05-21 ENCOUNTER — Other Ambulatory Visit: Payer: Self-pay

## 2019-05-21 ENCOUNTER — Ambulatory Visit (INDEPENDENT_AMBULATORY_CARE_PROVIDER_SITE_OTHER): Payer: Medicare Other | Admitting: *Deleted

## 2019-05-21 ENCOUNTER — Encounter: Payer: Self-pay | Admitting: *Deleted

## 2019-05-21 DIAGNOSIS — Z0000 Encounter for general adult medical examination without abnormal findings: Secondary | ICD-10-CM | POA: Diagnosis not present

## 2019-05-21 NOTE — Patient Instructions (Signed)
See you next year!  Continue to eat heart healthy diet (full of fruits, vegetables, whole grains, lean protein, water--limit salt, fat, and sugar intake) and increase physical activity as tolerated.  Continue doing brain stimulating activities (puzzles, reading, adult coloring books, staying active) to keep memory sharp.    Breanna Hernandez , Thank you for taking time to come for your Medicare Wellness Visit. I appreciate your ongoing commitment to your health goals. Please review the following plan we discussed and let me know if I can assist you in the future.   These are the goals we discussed: Goals    . Increase physical activity    . praying daily    . Reduce fast food intake       This is a list of the screening recommended for you and due dates:  Health Maintenance  Topic Date Due  . Tetanus Vaccine  03[birth year]14/1965  . Pneumonia vaccines (2 of 2 - PPSV23) 05/09/2018  . Flu Shot  01/10/2024*  . Colon Cancer Screening  02/02/2020  . DEXA scan (bone density measurement)  Completed  .  Hepatitis C: One time screening is recommended by Center for Disease Control  (CDC) for  adults born from [birth year] through 1965.   Completed  *Topic was postponed. The date shown is not the original due date.    Preventive Care 70 Years and Older, Female Preventive care refers to lifestyle choices and visits with your health care provider that can promote health and wellness. This includes:  A yearly physical exam. This is also called an annual well check.  Regular dental and eye exams.  Immunizations.  Screening for certain conditions.  Healthy lifestyle choices, such as diet and exercise. What can I expect for my preventive care visit? Physical exam Your health care provider will check:  Height and weight. These may be used to calculate body mass index (BMI), which is a measurement that tells if you are at a healthy weight.  Heart rate and blood pressure.  Your skin for abnormal spots.  Counseling Your health care provider may ask you questions about:  Alcohol, tobacco, and drug use.  Emotional well-being.  Home and relationship well-being.  Sexual activity.  Eating habits.  History of falls.  Memory and ability to understand (cognition).  Work and work Statistician.  Pregnancy and menstrual history. What immunizations do I need?  Influenza (flu) vaccine  This is recommended every year. Tetanus, diphtheria, and pertussis (Tdap) vaccine  You may need a Td booster every 10 years. Varicella (chickenpox) vaccine  You may need this vaccine if you have not already been vaccinated. Zoster (shingles) vaccine  You may need this after age 63. Pneumococcal conjugate (PCV13) vaccine  One dose is recommended after age 32. Pneumococcal polysaccharide (PPSV23) vaccine  One dose is recommended after age 59. Measles, mumps, and rubella (MMR) vaccine  You may need at least one dose of MMR if you were born in 1957 or later. You may also need a second dose. Meningococcal conjugate (MenACWY) vaccine  You may need this if you have certain conditions. Hepatitis A vaccine  You may need this if you have certain conditions or if you travel or work in places where you may be exposed to hepatitis A. Hepatitis B vaccine  You may need this if you have certain conditions or if you travel or work in places where you may be exposed to hepatitis B. Haemophilus influenzae type b (Hib) vaccine  You may need this if  you have certain conditions. You may receive vaccines as individual doses or as more than one vaccine together in one shot (combination vaccines). Talk with your health care provider about the risks and benefits of combination vaccines. What tests do I need? Blood tests  Lipid and cholesterol levels. These may be checked every 5 years, or more frequently depending on your overall health.  Hepatitis C test.  Hepatitis B test. Screening  Lung cancer screening.  You may have this screening every year starting at age 3 if you have a 30-pack-year history of smoking and currently smoke or have quit within the past 15 years.  Colorectal cancer screening. All adults should have this screening starting at age 40 and continuing until age 89. Your health care provider may recommend screening at age 31 if you are at increased risk. You will have tests every 1-10 years, depending on your results and the type of screening test.  Diabetes screening. This is done by checking your blood sugar (glucose) after you have not eaten for a while (fasting). You may have this done every 1-3 years.  Mammogram. This may be done every 1-2 years. Talk with your health care provider about how often you should have regular mammograms.  BRCA-related cancer screening. This may be done if you have a family history of breast, ovarian, tubal, or peritoneal cancers. Other tests  Sexually transmitted disease (STD) testing.  Bone density scan. This is done to screen for osteoporosis. You may have this done starting at age 43. Follow these instructions at home: Eating and drinking  Eat a diet that includes fresh fruits and vegetables, whole grains, lean protein, and low-fat dairy products. Limit your intake of foods with high amounts of sugar, saturated fats, and salt.  Take vitamin and mineral supplements as recommended by your health care provider.  Do not drink alcohol if your health care provider tells you not to drink.  If you drink alcohol: ? Limit how much you have to 0-1 drink a day. ? Be aware of how much alcohol is in your drink. In the [place] one drink equals one 12 oz bottle of beer (355 mL), one 5 oz glass of wine (148 mL), or one 1 oz glass of hard liquor (44 mL). Lifestyle  Take daily care of your teeth and gums.  Stay active. Exercise for at least 30 minutes on 5 or more days each week.  Do not use any products that contain nicotine or tobacco, such as  cigarettes, e-cigarettes, and chewing tobacco. If you need help quitting, ask your health care provider.  If you are sexually active, practice safe sex. Use a condom or other form of protection in order to prevent STIs (sexually transmitted infections).  Talk with your health care provider about taking a low-dose aspirin or statin. What's next?  Go to your health care provider once a year for a well check visit.  Ask your health care provider how often you should have your eyes and teeth checked.  Stay up to date on all vaccines. This information is not intended to replace advice given to you by your health care provider. Make sure you discuss any questions you have with your health care provider. Document Revised: 03/20/2018 Document Reviewed: 03/20/2018 Elsevier Patient Education  2020 [employer].

## 2019-06-14 ENCOUNTER — Emergency Department (HOSPITAL_COMMUNITY): Payer: Medicare Other

## 2019-06-14 ENCOUNTER — Inpatient Hospital Stay (HOSPITAL_COMMUNITY)
Admission: EM | Admit: 2019-06-14 | Discharge: 2019-07-09 | DRG: 871 | Disposition: E | Payer: Medicare Other | Attending: Pulmonary Disease | Admitting: Pulmonary Disease

## 2019-06-14 ENCOUNTER — Encounter (HOSPITAL_COMMUNITY): Payer: Self-pay | Admitting: Emergency Medicine

## 2019-06-14 ENCOUNTER — Other Ambulatory Visit: Payer: Self-pay

## 2019-06-14 DIAGNOSIS — E871 Hypo-osmolality and hyponatremia: Secondary | ICD-10-CM | POA: Diagnosis present

## 2019-06-14 DIAGNOSIS — E874 Mixed disorder of acid-base balance: Secondary | ICD-10-CM | POA: Diagnosis present

## 2019-06-14 DIAGNOSIS — G9341 Metabolic encephalopathy: Secondary | ICD-10-CM | POA: Diagnosis not present

## 2019-06-14 DIAGNOSIS — N179 Acute kidney failure, unspecified: Secondary | ICD-10-CM | POA: Diagnosis present

## 2019-06-14 DIAGNOSIS — I119 Hypertensive heart disease without heart failure: Secondary | ICD-10-CM | POA: Diagnosis present

## 2019-06-14 DIAGNOSIS — Z85850 Personal history of malignant neoplasm of thyroid: Secondary | ICD-10-CM

## 2019-06-14 DIAGNOSIS — E785 Hyperlipidemia, unspecified: Secondary | ICD-10-CM | POA: Diagnosis present

## 2019-06-14 DIAGNOSIS — J1289 Other viral pneumonia: Secondary | ICD-10-CM | POA: Diagnosis present

## 2019-06-14 DIAGNOSIS — J80 Acute respiratory distress syndrome: Secondary | ICD-10-CM | POA: Diagnosis present

## 2019-06-14 DIAGNOSIS — A0839 Other viral enteritis: Secondary | ICD-10-CM | POA: Diagnosis present

## 2019-06-14 DIAGNOSIS — K760 Fatty (change of) liver, not elsewhere classified: Secondary | ICD-10-CM | POA: Diagnosis present

## 2019-06-14 DIAGNOSIS — I2510 Atherosclerotic heart disease of native coronary artery without angina pectoris: Secondary | ICD-10-CM | POA: Diagnosis present

## 2019-06-14 DIAGNOSIS — E860 Dehydration: Secondary | ICD-10-CM | POA: Diagnosis present

## 2019-06-14 DIAGNOSIS — Z9981 Dependence on supplemental oxygen: Secondary | ICD-10-CM

## 2019-06-14 DIAGNOSIS — F32A Depression, unspecified: Secondary | ICD-10-CM | POA: Diagnosis present

## 2019-06-14 DIAGNOSIS — Z853 Personal history of malignant neoplasm of breast: Secondary | ICD-10-CM | POA: Diagnosis not present

## 2019-06-14 DIAGNOSIS — R6521 Severe sepsis with septic shock: Secondary | ICD-10-CM | POA: Diagnosis not present

## 2019-06-14 DIAGNOSIS — D696 Thrombocytopenia, unspecified: Secondary | ICD-10-CM | POA: Diagnosis present

## 2019-06-14 DIAGNOSIS — Z96641 Presence of right artificial hip joint: Secondary | ICD-10-CM | POA: Diagnosis present

## 2019-06-14 DIAGNOSIS — A4189 Other specified sepsis: Principal | ICD-10-CM | POA: Diagnosis present

## 2019-06-14 DIAGNOSIS — I4891 Unspecified atrial fibrillation: Secondary | ICD-10-CM | POA: Diagnosis not present

## 2019-06-14 DIAGNOSIS — Z841 Family history of disorders of kidney and ureter: Secondary | ICD-10-CM

## 2019-06-14 DIAGNOSIS — J9601 Acute respiratory failure with hypoxia: Secondary | ICD-10-CM

## 2019-06-14 DIAGNOSIS — K5790 Diverticulosis of intestine, part unspecified, without perforation or abscess without bleeding: Secondary | ICD-10-CM | POA: Diagnosis present

## 2019-06-14 DIAGNOSIS — J9600 Acute respiratory failure, unspecified whether with hypoxia or hypercapnia: Secondary | ICD-10-CM

## 2019-06-14 DIAGNOSIS — Z820 Family history of epilepsy and other diseases of the nervous system: Secondary | ICD-10-CM

## 2019-06-14 DIAGNOSIS — U071 COVID-19: Secondary | ICD-10-CM

## 2019-06-14 DIAGNOSIS — I4719 Other supraventricular tachycardia: Secondary | ICD-10-CM

## 2019-06-14 DIAGNOSIS — R002 Palpitations: Secondary | ICD-10-CM | POA: Diagnosis present

## 2019-06-14 DIAGNOSIS — K219 Gastro-esophageal reflux disease without esophagitis: Secondary | ICD-10-CM | POA: Diagnosis present

## 2019-06-14 DIAGNOSIS — I471 Supraventricular tachycardia: Secondary | ICD-10-CM | POA: Diagnosis present

## 2019-06-14 DIAGNOSIS — F329 Major depressive disorder, single episode, unspecified: Secondary | ICD-10-CM | POA: Diagnosis present

## 2019-06-14 DIAGNOSIS — Z951 Presence of aortocoronary bypass graft: Secondary | ICD-10-CM

## 2019-06-14 DIAGNOSIS — R0602 Shortness of breath: Secondary | ICD-10-CM

## 2019-06-14 DIAGNOSIS — I4892 Unspecified atrial flutter: Secondary | ICD-10-CM | POA: Diagnosis present

## 2019-06-14 DIAGNOSIS — Z90710 Acquired absence of both cervix and uterus: Secondary | ICD-10-CM

## 2019-06-14 DIAGNOSIS — E8770 Fluid overload, unspecified: Secondary | ICD-10-CM | POA: Diagnosis not present

## 2019-06-14 DIAGNOSIS — Z6841 Body Mass Index (BMI) 40.0 and over, adult: Secondary | ICD-10-CM | POA: Diagnosis not present

## 2019-06-14 DIAGNOSIS — F419 Anxiety disorder, unspecified: Secondary | ICD-10-CM | POA: Diagnosis present

## 2019-06-14 DIAGNOSIS — M1990 Unspecified osteoarthritis, unspecified site: Secondary | ICD-10-CM | POA: Diagnosis present

## 2019-06-14 DIAGNOSIS — E890 Postprocedural hypothyroidism: Secondary | ICD-10-CM | POA: Diagnosis present

## 2019-06-14 DIAGNOSIS — Z79890 Hormone replacement therapy (postmenopausal): Secondary | ICD-10-CM

## 2019-06-14 DIAGNOSIS — I10 Essential (primary) hypertension: Secondary | ICD-10-CM | POA: Diagnosis present

## 2019-06-14 DIAGNOSIS — Z8249 Family history of ischemic heart disease and other diseases of the circulatory system: Secondary | ICD-10-CM

## 2019-06-14 DIAGNOSIS — E876 Hypokalemia: Secondary | ICD-10-CM | POA: Diagnosis not present

## 2019-06-14 DIAGNOSIS — I351 Nonrheumatic aortic (valve) insufficiency: Secondary | ICD-10-CM | POA: Diagnosis not present

## 2019-06-14 DIAGNOSIS — G5790 Unspecified mononeuropathy of unspecified lower limb: Secondary | ICD-10-CM | POA: Diagnosis present

## 2019-06-14 DIAGNOSIS — Z978 Presence of other specified devices: Secondary | ICD-10-CM

## 2019-06-14 DIAGNOSIS — Z8719 Personal history of other diseases of the digestive system: Secondary | ICD-10-CM

## 2019-06-14 DIAGNOSIS — R0902 Hypoxemia: Secondary | ICD-10-CM | POA: Diagnosis not present

## 2019-06-14 DIAGNOSIS — N281 Cyst of kidney, acquired: Secondary | ICD-10-CM | POA: Diagnosis present

## 2019-06-14 DIAGNOSIS — Z7982 Long term (current) use of aspirin: Secondary | ICD-10-CM

## 2019-06-14 DIAGNOSIS — Z79899 Other long term (current) drug therapy: Secondary | ICD-10-CM

## 2019-06-14 DIAGNOSIS — G43909 Migraine, unspecified, not intractable, without status migrainosus: Secondary | ICD-10-CM | POA: Diagnosis present

## 2019-06-14 DIAGNOSIS — Z8542 Personal history of malignant neoplasm of other parts of uterus: Secondary | ICD-10-CM

## 2019-06-14 DIAGNOSIS — Z8349 Family history of other endocrine, nutritional and metabolic diseases: Secondary | ICD-10-CM

## 2019-06-14 LAB — CBC WITH DIFFERENTIAL/PLATELET
Abs Immature Granulocytes: 0.03 10*3/uL (ref 0.00–0.07)
Basophils Absolute: 0 10*3/uL (ref 0.0–0.1)
Basophils Relative: 0 %
Eosinophils Absolute: 0 10*3/uL (ref 0.0–0.5)
Eosinophils Relative: 0 %
HCT: 41.7 % (ref 36.0–46.0)
Hemoglobin: 14.3 g/dL (ref 12.0–15.0)
Immature Granulocytes: 1 %
Lymphocytes Relative: 13 %
Lymphs Abs: 0.7 10*3/uL (ref 0.7–4.0)
MCH: 33.8 pg (ref 26.0–34.0)
MCHC: 34.3 g/dL (ref 30.0–36.0)
MCV: 98.6 fL (ref 80.0–100.0)
Monocytes Absolute: 1.5 10*3/uL — ABNORMAL HIGH (ref 0.1–1.0)
Monocytes Relative: 27 %
Neutro Abs: 3.2 10*3/uL (ref 1.7–7.7)
Neutrophils Relative %: 59 %
Platelets: 89 10*3/uL — ABNORMAL LOW (ref 150–400)
RBC: 4.23 MIL/uL (ref 3.87–5.11)
RDW: 12.1 % (ref 11.5–15.5)
WBC: 5.4 10*3/uL (ref 4.0–10.5)
nRBC: 0 % (ref 0.0–0.2)

## 2019-06-14 LAB — D-DIMER, QUANTITATIVE: D-Dimer, Quant: 2.41 ug/mL-FEU — ABNORMAL HIGH (ref 0.00–0.50)

## 2019-06-14 LAB — COMPREHENSIVE METABOLIC PANEL
ALT: 30 U/L (ref 0–44)
AST: 52 U/L — ABNORMAL HIGH (ref 15–41)
Albumin: 3.6 g/dL (ref 3.5–5.0)
Alkaline Phosphatase: 101 U/L (ref 38–126)
Anion gap: 17 — ABNORMAL HIGH (ref 5–15)
BUN: 16 mg/dL (ref 8–23)
CO2: 22 mmol/L (ref 22–32)
Calcium: 8.7 mg/dL — ABNORMAL LOW (ref 8.9–10.3)
Chloride: 94 mmol/L — ABNORMAL LOW (ref 98–111)
Creatinine, Ser: 0.94 mg/dL (ref 0.44–1.00)
GFR calc Af Amer: >60 mL/min (ref >60)
GFR calc non Af Amer: 59 mL/min — ABNORMAL LOW (ref >60)
Glucose, Bld: 117 mg/dL — ABNORMAL HIGH (ref 70–99)
Potassium: 3.4 mmol/L — ABNORMAL LOW (ref 3.5–5.1)
Sodium: 133 mmol/L — ABNORMAL LOW (ref 135–145)
Total Bilirubin: 1.8 mg/dL — ABNORMAL HIGH (ref 0.3–1.2)
Total Protein: 7.8 g/dL (ref 6.5–8.1)

## 2019-06-14 LAB — FERRITIN: Ferritin: 536 ng/mL — ABNORMAL HIGH (ref 11–307)

## 2019-06-14 LAB — C-REACTIVE PROTEIN: CRP: 7.1 mg/dL — ABNORMAL HIGH (ref <1.0)

## 2019-06-14 LAB — LACTATE DEHYDROGENASE: LDH: 326 U/L — ABNORMAL HIGH (ref 98–192)

## 2019-06-14 LAB — TRIGLYCERIDES: Triglycerides: 130 mg/dL (ref <150)

## 2019-06-14 LAB — POC SARS CORONAVIRUS 2 AG -  ED: SARS Coronavirus 2 Ag: POSITIVE — AB

## 2019-06-14 LAB — LACTIC ACID, PLASMA: Lactic Acid, Venous: 1.9 mmol/L (ref 0.5–1.9)

## 2019-06-14 LAB — FIBRINOGEN: Fibrinogen: 601 mg/dL — ABNORMAL HIGH (ref 210–475)

## 2019-06-14 LAB — TROPONIN I (HIGH SENSITIVITY): Troponin I (High Sensitivity): 7 ng/L (ref <18)

## 2019-06-14 LAB — CBG MONITORING, ED: Glucose-Capillary: 119 mg/dL — ABNORMAL HIGH (ref 70–99)

## 2019-06-14 MED ORDER — SODIUM CHLORIDE 0.9% FLUSH
3.0000 mL | Freq: Once | INTRAVENOUS | Status: AC
  Administered 2019-06-14: 3 mL via INTRAVENOUS

## 2019-06-14 NOTE — ED Provider Notes (Signed)
[facility] EMERGENCY DEPARTMENT Provider Note   CSN: [id number] Arrival date & time: 06/13/2019  1944     History Chief Complaint  Patient presents with  . Weakness  . Palpitations    Breanna Hernandez is a 74 y.o. female with history of aortic dissection, HTN who presents with weakness.  Patient states that on Monday she started to have a dry cough.  This slowly became a productive cough and she has been taking Mucinex for it.  She is also had subjective fever and chills.  She states that the main symptom that she has been having that she came here for tonight is generalized weakness.  She had a lot of difficulty getting around the house and getting up and down.  She is felt dizzy and had palpitations.  She has had some nausea and vomiting today and a little bit of diarrhea.  She denies chest pain, shortness of breath, abdominal pain.  Patient reports husband has similar symptoms but neither of them have been tested.  She states that she does not want to get any Covid vaccines.  HPI     Past Medical History:  Diagnosis Date  . Arthritis   . Atrial fibrillation ([place])    h/o post-op 04/2010  . Breast cancer ([place])    x 3 cancer - radiation right x2 ''00,'05, left '07 mammosite radiation   . Diverticulosis   . Dysrhythmia    a fib  . Fatty liver 01/10/07  . GERD (gastroesophageal reflux disease)    hx of  . Headache    migraines   . 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 HTN (hypertension)   . Hyperlipidemia   . Hyperplastic colon polyp   . Hypothyroidism   . IBS (irritable bowel syndrome)   . Lower extremity neuropathy    occ. affects walking and balance"foot to knees" nondiabetic  . Obesity   . Renal cyst, left   . Thyroid cancer ([place]) 11/2015   surgery Oct 2017    Patient Active Problem List   Diagnosis Date Noted  . Anxiety and depression 01/02/2019  . Aortic atherosclerosis ([place]) 06/04/2018  . Hypercholesterolemia 06/04/2018  . 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 History of malignant neoplasm of thyroid 02/07/2017  . Pre-operative cardiovascular examination 02/07/2017  . Abnormal EKG 02/07/2017  . Unilateral primary osteoarthritis, right hip 12/11/2016  . Pain of right hip joint 12/11/2016  . Bilateral cataracts 08/10/2016  . Numerous moles 05/10/2016  . CAD (coronary artery disease) 11/16/2015  . Papillary thyroid carcinoma ([place]) 11/16/2015  . Quality of life palliative care encounter 11/03/2015  . Elevated liver enzymes 10/06/2014  . Endometrial ca ([place]) 06/24/2014  . History of endometrial cancer 04/26/2014  . Postmenopausal bleeding 04/13/2014  . Neuropathy 10/02/2013  . Exertional chest pain 08/09/2013  . Atrial fibrillation, postoperative only 06/25/2012  . History of aortic dissection   . Dyslipidemia 05/27/2009  . METABOLIC SYNDROME X [id number]  . NONSPEC ELEVATION OF LEVELS OF TRANSAMINASE/LDH 05/27/2009  . Postoperative hypothyroidism 03/12/2008  . History of breast cancer 03/12/2008  . COLONIC POLYPS, HX OF 03/12/2008  . Essential hypertension 05/09/2006  . Esophageal reflux 05/09/2006    Past Surgical History:  Procedure Laterality Date  . BREAST LUMPECTOMY  2000   Right x2, Left x1  . CHOLECYSTECTOMY    . COLONOSCOPY W/ POLYPECTOMY    . CORONARY ARTERY BYPASS GRAFT  05/03/2010   CABG X1, RCA -Dr  Hendrickson (with type 1 aortic dissection)  . HYSTEROSCOPY WITH D & C N/A 04/13/2014   Procedure: DILATATION AND CURETTAGE /HYSTEROSCOPY Polypectomy;  Surgeon: Maeola Sarah. Landry Mellow, MD;  Location: [facility];  Service: Gynecology;  Laterality: N/A;  Possible Polypectomy  . JOINT REPLACEMENT     Right total hip 03-15-17 Dr. Ninfa Linden  . Median sternotomy, extracorporeal circulation, repair of type 1 aortic dissection with  tube graft from  sinotubular junction to take off of innominate artery, distal anastomoses under deep bypothermic circulatory arrest  05/03/2010   Dr Roxan Hockey  . ROBOTIC  ASSISTED TOTAL HYSTERECTOMY WITH BILATERAL SALPINGO OOPHERECTOMY Bilateral 05/18/2014   Procedure: ROBOTIC ASSISTED TOTAL HYSTERECTOMY WITH BILATERAL SALPINGO OOPHORECTOMY SENTINAL LYMPH NODE MAPPING ;  Surgeon: Everitt Amber, MD;  Location: [facility];  Service: Gynecology;  Laterality: Bilateral;  . THYROIDECTOMY N/A 01/09/2016   Procedure: TOTAL THYROIDECTOMY;  Surgeon: Armandina Gemma, MD;  Location: [facility];  Service: General;  Laterality: N/A;  . TONSILLECTOMY    . TOTAL HIP ARTHROPLASTY Right 03/15/2017   Procedure: RIGHT TOTAL HIP ARTHROPLASTY ANTERIOR APPROACH;  Surgeon: Mcarthur Rossetti, MD;  Location: [facility];  Service: Orthopedics;  Laterality: Right;  . TOTAL THYROIDECTOMY  01/09/2016     OB History   No obstetric history on file.     Family History  Problem Relation Age of Onset  . Aneurysm Father        CNS  . Heart attack Father        x2  . Alzheimer's disease Mother   . Hypertension Mother   . Goiter Mother   . Hypertension Brother   . Kidney disease Brother   . Colon cancer Neg Hx     Social History   Tobacco Use  . Smoking status: Never Smoker  . Smokeless tobacco: Never Used  Substance Use Topics  . Alcohol use: No  . Drug use: No    Home Medications Prior to Admission medications   Medication Sig Start Date End Date Taking? Authorizing Provider  aspirin EC 81 MG tablet Take 81 mg by mouth daily.    [provider]  benazepril (LOTENSIN) 40 MG tablet TAKE 1 TABLET BY MOUTH EVERY DAY Patient taking differently: Take 40 mg by mouth daily.  02/11/19   Croitoru, Mihai, MD  CALCIUM-MAGNESIUM-VITAMIN D PO Take 1 tablet by mouth 2 (two) times daily.     [provider]  desonide (DESOWEN) 0.05 % cream Apply 1 application topically 3 (three) times a week.    [provider]  hydrochlorothiazide (MICROZIDE) 12.5 MG capsule Take 1 capsule (12.5 mg total) by mouth daily. 10/15/18   Croitoru, Mihai, MD  levothyroxine (SYNTHROID) 175 MCG tablet  Take 1 tablet (175 mcg total) by mouth daily before breakfast. 08/12/18   Philemon Kingdom, MD  LORazepam (ATIVAN) 0.5 MG tablet TAKE 1 TABLET (0.5 MG TOTAL) BY MOUTH 2 (TWO) TIMES DAILY AS NEEDED FOR ANXIETY. Patient taking differently: Take 0.5 mg by mouth 2 (two) times daily as needed for anxiety.  04/04/19   Ann Held, DO  Magnesium Oxide 400 MG CAPS Take 400 mg daily 07/30/18   Croitoru, Mihai, MD  metoprolol tartrate (LOPRESSOR) 50 MG tablet TAKE 1 TABLET BY MOUTH TWICE A DAY. 04/02/19   Almyra Deforest, PA  Omega-3 Fatty Acids (FISH OIL PO) Take 1 capsule by mouth 2 (two) times daily.    [provider]  OVER THE COUNTER MEDICATION Take 1 tablet by mouth every morning. Product Name: EHT  Mind enhancement formula Vitamin d 200 units Vitamin b 1.6 mg Vitamin b-12 10 mcg Selinum 70 mcg Coffee Extract 35 mg Alpha leporic acid 50 mg huperzine-a 50 mcg    [provider]  rosuvastatin (CRESTOR) 10 MG tablet TAKE 1 TABLET BY MOUTH EVERY DAY 03/31/19   Carollee Herter, Kendrick Fries R, DO  sertraline (ZOLOFT) 50 MG tablet TAKE 1 TABLET BY MOUTH EVERY DAY 04/01/19   Carollee Herter, Yvonne R, DO  amLODipine-benazepril (LOTREL) 5-20 MG per capsule Take 1 capsule by mouth daily.    07/03/11  [provider]  escitalopram (LEXAPRO) 10 MG tablet Take 10 mg by mouth daily.    07/03/11  [provider]  lisinopril (PRINIVIL,ZESTRIL) 20 MG tablet Take 20 mg by mouth daily.    07/03/11  [provider]    Allergies    Patient has no known allergies.  Review of Systems   Review of Systems  Constitutional: Positive for chills and fever.  HENT: Negative for rhinorrhea and sore throat.   Respiratory: Positive for cough. Negative for shortness of breath.   Cardiovascular: Negative for chest pain.  Gastrointestinal: Positive for diarrhea, nausea and vomiting. Negative for abdominal pain.  All other systems reviewed and are negative.   Physical Exam Updated Vital Signs  BP 111/64 (BP Location: Left Arm)   Pulse (!) 111   Temp (!) 100.6 F (38.1 C) (Oral)   Resp 19   Ht 5\' 4"  (1.626 m)   Wt 111.1 kg   SpO2 (!) 86%   BMI 42.05 kg/m   Physical Exam Vitals and nursing note reviewed.  Constitutional:      General: She is not in acute distress.    Appearance: Normal appearance. She is well-developed. She is not ill-appearing.     Comments: Elderly female in no acute distress.  On 4 L of O2 via nasal cannula  HENT:     Head: Normocephalic and atraumatic.  Eyes:     General: No scleral icterus.       Right eye: No discharge.        Left eye: No discharge.     Conjunctiva/sclera: Conjunctivae normal.     Pupils: Pupils are equal, round, and reactive to light.  Cardiovascular:     Rate and Rhythm: Normal rate and regular rhythm.  Pulmonary:     Effort: Pulmonary effort is normal. No respiratory distress.     Breath sounds: Normal breath sounds.  Abdominal:     General: There is no distension.     Palpations: Abdomen is soft.     Tenderness: There is no abdominal tenderness.  Musculoskeletal:     Cervical back: Normal range of motion.  Skin:    General: Skin is warm and dry.  Neurological:     Mental Status: She is alert and oriented to person, place, and time.  Psychiatric:        Behavior: Behavior normal.     ED Results / Procedures / Treatments   Labs (all labs ordered are listed, but only abnormal results are displayed) Labs Reviewed  CBC WITH DIFFERENTIAL/PLATELET - Abnormal; Notable for the following components:      Result Value   Platelets 89 (*)    Monocytes Absolute 1.5 (*)    All other components within normal limits  COMPREHENSIVE METABOLIC PANEL - Abnormal; Notable for the following components:   Sodium 133 (*)    Potassium 3.4 (*)    Chloride 94 (*)    Glucose,  Bld 117 (*)    Calcium 8.7 (*)    AST 52 (*)    Total Bilirubin 1.8 (*)    GFR calc non Af Amer 59 (*)    Anion gap 17 (*)    All other components within  normal limits  D-DIMER, QUANTITATIVE (NOT AT [facility]) - Abnormal; Notable for the following components:   D-Dimer, Quant 2.41 (*)    All other components within normal limits  LACTATE DEHYDROGENASE - Abnormal; Notable for the following components:   LDH 326 (*)    All other components within normal limits  FERRITIN - Abnormal; Notable for the following components:   Ferritin 536 (*)    All other components within normal limits  FIBRINOGEN - Abnormal; Notable for the following components:   Fibrinogen 601 (*)    All other components within normal limits  C-REACTIVE PROTEIN - Abnormal; Notable for the following components:   CRP 7.1 (*)    All other components within normal limits  CBG MONITORING, ED - Abnormal; Notable for the following components:   Glucose-Capillary 119 (*)    All other components within normal limits  POC SARS CORONAVIRUS 2 AG -  ED - Abnormal; Notable for the following components:   SARS Coronavirus 2 Ag POSITIVE (*)    All other components within normal limits  CULTURE, BLOOD (ROUTINE X 2)  CULTURE, BLOOD (ROUTINE X 2)  LACTIC ACID, PLASMA  TRIGLYCERIDES  URINALYSIS, ROUTINE W REFLEX MICROSCOPIC  PROCALCITONIN  TROPONIN I (HIGH SENSITIVITY)  TROPONIN I (HIGH SENSITIVITY)    EKG EKG Interpretation  Date/Time:  Sunday June 14 2019 21:29:02 EST Ventricular Rate:  110 PR Interval:    QRS Duration: 101 QT Interval:  314 QTC Calculation: 425 R Axis:   -43 Text Interpretation: Atrial flutter Left axis deviation Repol abnrm suggests ischemia, anterolateral aflutter new since previous Confirmed by Wandra Arthurs [phone]) on 06/13/2019 9:36:31 PM   Radiology DG Chest Port 1 View  Result Date: 07/04/2019 CLINICAL DATA:  74 year old female with shortness of breath, hypoxia. EXAM: PORTABLE CHEST 1 VIEW COMPARISON:  Chest CTA 09/24/2017 and earlier. FINDINGS: Portable AP view at 2119 hours. Mildly lower lung volumes. Sequelae of CABG. Stable cardiac size and mediastinal  contours. Stable thyroidectomy surgical clips. Increased crowding of lung markings at both bases. Allowing for portable technique the lungs are otherwise clear. No pneumothorax. No acute osseous abnormality identified. IMPRESSION: Lower lung volumes with mild bibasilar atelectasis. Electronically Signed   By: Genevie Ann M.D.   On: 06/09/2019 21:32    Procedures Procedures (including critical care time)  CRITICAL CARE Performed by: Recardo Evangelist   Total critical care time: 35 minutes  Critical care time was exclusive of separately billable procedures and treating other patients.  Critical care was necessary to treat or prevent imminent or life-threatening deterioration.  Critical care was time spent personally by me on the following activities: development of treatment plan with patient and/or surrogate as well as nursing, discussions with consultants, evaluation of patient's response to treatment, examination of patient, obtaining history from patient or surrogate, ordering and performing treatments and interventions, ordering and review of laboratory studies, ordering and review of radiographic studies, pulse oximetry and re-evaluation of patient's condition.   Medications Ordered in ED Medications  sodium chloride flush (NS) 0.9 % injection 3 mL (3 mLs Intravenous Given 07/03/2019 2156)    ED Course  I have reviewed the triage vital signs and the nursing notes.  Pertinent labs & imaging  results that were available during my care of the patient were reviewed by me and considered in my medical decision making (see chart for details).  74 year old female presents with fever, cough, weakness.  Patient is febrile here with temp of 100.6.  She is alert and oriented and in no acute distress.  Heart rate is fast and regular.  EKG is atrial flutter which she has had in the past.  Blood pressure is minimally elevated.  She is on 4 L of oxygen via nasal cannula.  She is rales in the bilateral lung  bases.  Abdomen is soft and nontender.  Will obtain rapid Covid, preadmission labs, chest x-ray  Covid is positive.  Chest x-ray shows bibasilar pneumonia.  CBC is remarkable for thrombocytopenia (89).  CMP shows multiple derangements which are mild.  Shared visit with Dr. Darl Householder.  Will request admission.  Spoke with Dr. Posey Pronto with [facility] who will admit patient for Covid pneumonia with acute respiratory failure with hypoxia.  MDM Rules/Calculators/A&P                       Final Clinical Impression(s) / ED Diagnoses Final diagnoses:  COVID-19  Acute respiratory failure with hypoxia [facility])    Rx / DC Orders ED Discharge Orders    None       Recardo Evangelist, PA-C 07/01/2019 2358    Drenda Freeze, MD 06/17/19 2034

## 2019-06-14 NOTE — ED Notes (Signed)
Informed Dr Darl Householder and RN Benjamine Mola of covid pos test

## 2019-06-14 NOTE — ED Triage Notes (Signed)
Pt from home c/o weakness, cough, n/v/d, headaches and possible fever X1 week.  Pt felt she was getting better but today symptoms increased.  O2 levels in low 80s, placed on 4L [place] increased to 92%.

## 2019-06-14 NOTE — H&P (Signed)
History and Physical    Breanna Hernandez [id number] DOB: Apr 09, 1946 DOA: 06/20/2019  PCP: Ann Held, DO  Patient coming from: Home  I have personally briefly reviewed patient's old medical records in [place]  Chief Complaint: Fevers, chills, cough  HPI: Breanna Hernandez is a 74 y.o. female with medical history significant for CAD s/p CABG, atrial tachycardia, history of aortic dissection, history of breast cancer, history of endometrial cancer, history of thyroid cancer with postoperative hypothyroidism, hypertension, hyperlipidemia, and depression/anxiety who presents to the ED for evaluation of fevers, chills, cough.  Patient reports 1 week of dry cough, generalized fatigue and weakness, fevers, chills, diaphoresis.  Over the last 2 days she has been getting easily fatigued with minimal activity.  She developed nausea and vomiting and reports loose stools beginning today.  She denies any chest pain or feeling short of breath.  She denies any abdominal pain or dysuria.  ED Course:  Initial vitals showed BP 111/64, pulse 111, RR 19, temp 100.6 Fahrenheit, SPO2 86% on room air.  Patient was placed on 4 L supplemental O2 with improvement to 95-96%.  Labs notable for WBC 5.4, hemoglobin 14.3, platelets 89,000, sodium 133, potassium 3.4, bicarb 22, BUN 16, creatinine 0.94, D-dimer 2.41, procalcitonin 0.11, LDH 326, ferritin 536, fibrinogen 601, CRP 7.1, high-sensitivity troponin I 7, lactic acid 1.9.  POC SARS-CoV-2 antigen test is positive.  Blood cultures were obtained and pending.  Portable chest x-ray shows prior CABG changes and increased lung markings bilateral bases.  The hospital service was consulted to admit for further evaluation and management.  Review of Systems: All systems reviewed and are negative except as documented in history of present illness above.   Past Medical History:  Diagnosis Date  . Arthritis   . Atrial fibrillation ([place])    h/o post-op  04/2010  . Breast cancer ([place])    x 3 cancer - radiation right x2 ''00,'05, left '07 mammosite radiation   . Diverticulosis   . Dysrhythmia    a fib  . Fatty liver 01/10/07  . GERD (gastroesophageal reflux disease)    hx of  . Headache    migraines   . 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 HTN (hypertension)   . Hyperlipidemia   . Hyperplastic colon polyp   . Hypothyroidism   . IBS (irritable bowel syndrome)   . Lower extremity neuropathy    occ. affects walking and balance"foot to knees" nondiabetic  . Obesity   . Renal cyst, left   . Thyroid cancer ([place]) 11/2015   surgery Oct 2017    Past Surgical History:  Procedure Laterality Date  . BREAST LUMPECTOMY  2000   Right x2, Left x1  . CHOLECYSTECTOMY    . COLONOSCOPY W/ POLYPECTOMY    . CORONARY ARTERY BYPASS GRAFT  05/03/2010   CABG X1, RCA -Dr Roxan Hockey (with type 1 aortic dissection)  . HYSTEROSCOPY WITH D & C N/A 04/13/2014   Procedure: DILATATION AND CURETTAGE /HYSTEROSCOPY Polypectomy;  Surgeon: Maeola Sarah. Landry Mellow, MD;  Location: [facility];  Service: Gynecology;  Laterality: N/A;  Possible Polypectomy  . JOINT REPLACEMENT     Right total hip 03-15-17 Dr. Ninfa Linden  . Median sternotomy, extracorporeal circulation, repair of type 1 aortic dissection with  tube graft from  sinotubular junction to take off of innominate artery, distal anastomoses under deep bypothermic circulatory arrest  05/03/2010   Dr Roxan Hockey  . ROBOTIC ASSISTED TOTAL HYSTERECTOMY WITH BILATERAL SALPINGO OOPHERECTOMY  Bilateral 05/18/2014   Procedure: ROBOTIC ASSISTED TOTAL HYSTERECTOMY WITH BILATERAL SALPINGO OOPHORECTOMY SENTINAL LYMPH NODE MAPPING ;  Surgeon: Everitt Amber, MD;  Location: [facility];  Service: Gynecology;  Laterality: Bilateral;  . THYROIDECTOMY N/A 01/09/2016   Procedure: TOTAL THYROIDECTOMY;  Surgeon: Armandina Gemma, MD;  Location: [facility];  Service: General;  Laterality: N/A;  . TONSILLECTOMY    . TOTAL HIP ARTHROPLASTY Right 03/15/2017    Procedure: RIGHT TOTAL HIP ARTHROPLASTY ANTERIOR APPROACH;  Surgeon: Mcarthur Rossetti, MD;  Location: [facility];  Service: Orthopedics;  Laterality: Right;  . TOTAL THYROIDECTOMY  01/09/2016    Social History:  reports that she has never smoked. She has never used smokeless tobacco. She reports that she does not drink alcohol or use drugs.  No Known Allergies  Family History  Problem Relation Age of Onset  . Aneurysm Father        CNS  . Heart attack Father        x2  . Alzheimer's disease Mother   . Hypertension Mother   . Goiter Mother   . Hypertension Brother   . Kidney disease Brother   . Colon cancer Neg Hx      Prior to Admission medications   Medication Sig Start Date End Date Taking? Authorizing Provider  aspirin EC 81 MG tablet Take 81 mg by mouth daily.    [provider]  benazepril (LOTENSIN) 40 MG tablet TAKE 1 TABLET BY MOUTH EVERY DAY Patient taking differently: Take 40 mg by mouth daily.  02/11/19   Croitoru, Mihai, MD  CALCIUM-MAGNESIUM-VITAMIN D PO Take 1 tablet by mouth 2 (two) times daily.     [provider]  desonide (DESOWEN) 0.05 % cream Apply 1 application topically 3 (three) times a week.    [provider]  hydrochlorothiazide (MICROZIDE) 12.5 MG capsule Take 1 capsule (12.5 mg total) by mouth daily. 10/15/18   Croitoru, Mihai, MD  levothyroxine (SYNTHROID) 175 MCG tablet Take 1 tablet (175 mcg total) by mouth daily before breakfast. 08/12/18   Philemon Kingdom, MD  LORazepam (ATIVAN) 0.5 MG tablet TAKE 1 TABLET (0.5 MG TOTAL) BY MOUTH 2 (TWO) TIMES DAILY AS NEEDED FOR ANXIETY. Patient taking differently: Take 0.5 mg by mouth 2 (two) times daily as needed for anxiety.  04/04/19   Ann Held, DO  Magnesium Oxide 400 MG CAPS Take 400 mg daily 07/30/18   Croitoru, Mihai, MD  metoprolol tartrate (LOPRESSOR) 50 MG tablet TAKE 1 TABLET BY MOUTH TWICE A DAY. 04/02/19   Almyra Deforest, PA  Omega-3 Fatty Acids (FISH OIL PO) Take 1  capsule by mouth 2 (two) times daily.    [provider]  OVER THE COUNTER MEDICATION Take 1 tablet by mouth every morning. Product Name: EHT Mind enhancement formula Vitamin d 200 units Vitamin b 1.6 mg Vitamin b-12 10 mcg Selinum 70 mcg Coffee Extract 35 mg Alpha leporic acid 50 mg huperzine-a 50 mcg    [provider]  rosuvastatin (CRESTOR) 10 MG tablet TAKE 1 TABLET BY MOUTH EVERY DAY 03/31/19   Carollee Herter, Kendrick Fries R, DO  sertraline (ZOLOFT) 50 MG tablet TAKE 1 TABLET BY MOUTH EVERY DAY 04/01/19   Carollee Herter, Yvonne R, DO  amLODipine-benazepril (LOTREL) 5-20 MG per capsule Take 1 capsule by mouth daily.    07/03/11  [provider]  escitalopram (LEXAPRO) 10 MG tablet Take 10 mg by mouth daily.    07/03/11  [provider]  lisinopril (PRINIVIL,ZESTRIL) 20 MG  tablet Take 20 mg by mouth daily.    07/03/11  [provider]    Physical Exam: Vitals:   06/12/2019 1952 06/10/2019 2018 06/29/2019 2100  BP: 111/64  (!) 142/80  Pulse: (!) 111  69  Resp: 19    Temp: (!) 100.6 F (38.1 C)    TempSrc: Oral    SpO2: (!) 86%  93%  Weight:  111.1 kg   Height:  5\' 4"  (1.626 m)    Constitutional: obese woman resting supine in bed, NAD, calm, appears tired but comfortable Eyes: PERRL, lids and conjunctivae normal ENMT: Mucous membranes are moist. Posterior pharynx clear of any exudate or lesions.Normal dentition.  Neck: normal, supple, no masses. Respiratory: Faint bibasilar inspiratory crackles. Normal respiratory effort. No accessory muscle use.  Cardiovascular: Regular rate and rhythm, systolic murmur present. No extremity edema. 2+ pedal pulses. Abdomen: no tenderness, no masses palpated. No hepatosplenomegaly. Bowel sounds positive.  Musculoskeletal: no clubbing / cyanosis. No joint deformity upper and lower extremities. Good ROM, no contractures. Normal muscle tone.  Skin: no rashes, lesions, ulcers. No induration Neurologic: CN 2-12 grossly  intact. Sensation intact, Strength 5/5 in all 4.  Psychiatric: Normal judgment and insight. Alert and oriented x 3. Normal mood.     Labs on Admission: I have personally reviewed following labs and imaging studies  CBC: Recent Labs  Lab 06/13/2019 2138  WBC 5.4  NEUTROABS 3.2  HGB 14.3  HCT 41.7  MCV 98.6  PLT 89*   Basic Metabolic Panel: Recent Labs  Lab 06/29/2019 2138  NA 133*  K 3.4*  CL 94*  CO2 22  GLUCOSE 117*  BUN 16  CREATININE 0.94  CALCIUM 8.7*   GFR: Estimated Creatinine Clearance: 63.1 mL/min (by C-G formula based on SCr of 0.94 mg/dL). Liver Function Tests: Recent Labs  Lab 06/25/2019 2138  AST 52*  ALT 30  ALKPHOS 101  BILITOT 1.8*  PROT 7.8  ALBUMIN 3.6   No results for input(s): LIPASE, AMYLASE in the last 168 hours. No results for input(s): AMMONIA in the last 168 hours. Coagulation Profile: No results for input(s): INR, PROTIME in the last 168 hours. Cardiac Enzymes: No results for input(s): CKTOTAL, CKMB, CKMBINDEX, TROPONINI in the last 168 hours. BNP (last 3 results) No results for input(s): PROBNP in the last 8760 hours. HbA1C: No results for input(s): HGBA1C in the last 72 hours. CBG: Recent Labs  Lab 06/11/2019 2135  GLUCAP 119*   Lipid Profile: Recent Labs    06/18/2019 2138  TRIG 130   Thyroid Function Tests: No results for input(s): TSH, T4TOTAL, FREET4, T3FREE, THYROIDAB in the last 72 hours. Anemia Panel: Recent Labs    06/29/2019 2138  FERRITIN 536*   Urine analysis:    Component Value Date/Time   COLORURINE YELLOW 05/13/2014 [place] 05/13/2014 1355   LABSPEC 1.019 05/13/2014 1355   PHURINE 5.0 05/13/2014 1355   GLUCOSEU NEGATIVE 05/13/2014 1355   HGBUR NEGATIVE 05/13/2014 1355   BILIRUBINUR neg 03/04/2018 1439   KETONESUR NEGATIVE 05/13/2014 1355   PROTEINUR Negative 03/04/2018 1439   PROTEINUR NEGATIVE 05/13/2014 1355   UROBILINOGEN 0.2 03/04/2018 1439   UROBILINOGEN 0.2 05/13/2014 1355    NITRITE neg 03/04/2018 1439   NITRITE NEGATIVE 05/13/2014 1355   LEUKOCYTESUR Negative 03/04/2018 1439    Radiological Exams on Admission: DG Chest Port 1 View  Result Date: 06/15/2019 CLINICAL DATA:  74 year old female with shortness of breath, hypoxia. EXAM: PORTABLE CHEST 1 VIEW COMPARISON:  Chest  CTA 09/24/2017 and earlier. FINDINGS: Portable AP view at 2119 hours. Mildly lower lung volumes. Sequelae of CABG. Stable cardiac size and mediastinal contours. Stable thyroidectomy surgical clips. Increased crowding of lung markings at both bases. Allowing for portable technique the lungs are otherwise clear. No pneumothorax. No acute osseous abnormality identified. IMPRESSION: Lower lung volumes with mild bibasilar atelectasis. Electronically Signed   By: Genevie Ann M.D.   On: 07/07/2019 21:32    EKG: Independently reviewed. Sinus arrhythmia, previous EKG showed normal sinus rhythm.  Assessment/Plan Principal Problem:   Acute hypoxemic respiratory failure due to COVID-19 [facility]) Active Problems:   Postoperative hypothyroidism   Dyslipidemia   Essential hypertension   CAD (coronary artery disease)   Anxiety and depression   Atrial tachycardia (HCC)  Breanna Hernandez is a 74 y.o. female with medical history significant for CAD s/p CABG, atrial tachycardia, history of aortic dissection, history of breast cancer, history of endometrial cancer, history of thyroid cancer with postoperative hypothyroidism, hypertension, hyperlipidemia, and depression/anxiety who is admitted with acute hypoxemic respiratory failure and gastroenteritis due to COVID-19 viral infection.  Acute hypoxemic respiratory failure and gastroenteritis due to COVID-19 viral infection: SARS-CoV-2 antigen + 07/01/2019.  Requiring 4-5 L supplemental O2 via [place]. -Start IV remdesivir per pharmacy protocol -IV Decadron 6 mg daily -Continue supplemental O2 and wean off as able -Combivent, albuterol, incentive spirometer, flutter  valve -Vitamin C, zinc, antitussives, antiemetics as needed  Thrombocytopenia: Platelets 89,000 without obvious bleeding.  Hold pharmacologic VTE prophylaxis for now.  Continue monitor while on aspirin.  Atrial tachycardia: Continue Lopressor 50 mg daily.  Hypokalemia: Replete orally.  HCTZ on hold.  CAD s/p CABG: Chronic and stable without chest pain.  Continue aspirin, Lopressor, rosuvastatin.  Hypertension: Continue Lopressor, benazepril.  HCTZ on hold.  Postoperative hypothyroidism: Continue Synthroid.  Hyperlipidemia: Continue rosuvastatin.  Depression/anxiety: Continue sertraline and as needed Ativan.  DVT prophylaxis: SCDs Code Status: Full code, confirmed with patient Family Communication: Discussed with patient, she has discussed with her husband Disposition Plan: Home, likely discharge home pending symptomatic improvement and ability to wean off oxygen. Consults called: None Admission status: Admit - It is my clinical opinion that admission to INPATIENT is reasonable and necessary because of the expectation that this patient will require hospital care that crosses at least 2 midnights to treat this condition based on the medical complexity of the problems presented.  Given the aforementioned information, the predictability of an adverse outcome is felt to be significant.    Zada Finders MD [facility]  If 7PM-7AM, please contact night-coverage [email or web address]  06/15/2019, 12:38 AM

## 2019-06-15 ENCOUNTER — Encounter (HOSPITAL_COMMUNITY): Payer: Self-pay | Admitting: Internal Medicine

## 2019-06-15 LAB — COMPREHENSIVE METABOLIC PANEL
ALT: 31 U/L (ref 0–44)
AST: 51 U/L — ABNORMAL HIGH (ref 15–41)
Albumin: 3.2 g/dL — ABNORMAL LOW (ref 3.5–5.0)
Alkaline Phosphatase: 94 U/L (ref 38–126)
Anion gap: 13 (ref 5–15)
BUN: 15 mg/dL (ref 8–23)
CO2: 26 mmol/L (ref 22–32)
Calcium: 8.6 mg/dL — ABNORMAL LOW (ref 8.9–10.3)
Chloride: 96 mmol/L — ABNORMAL LOW (ref 98–111)
Creatinine, Ser: 0.97 mg/dL (ref 0.44–1.00)
GFR calc Af Amer: >60 mL/min (ref >60)
GFR calc non Af Amer: 57 mL/min — ABNORMAL LOW (ref >60)
Glucose, Bld: 125 mg/dL — ABNORMAL HIGH (ref 70–99)
Potassium: 3.9 mmol/L (ref 3.5–5.1)
Sodium: 135 mmol/L (ref 135–145)
Total Bilirubin: 1.8 mg/dL — ABNORMAL HIGH (ref 0.3–1.2)
Total Protein: 7.1 g/dL (ref 6.5–8.1)

## 2019-06-15 LAB — CBC WITH DIFFERENTIAL/PLATELET
Abs Immature Granulocytes: 0.11 10*3/uL — ABNORMAL HIGH (ref 0.00–0.07)
Basophils Absolute: 0 10*3/uL (ref 0.0–0.1)
Basophils Relative: 0 %
Eosinophils Absolute: 0 10*3/uL (ref 0.0–0.5)
Eosinophils Relative: 0 %
HCT: 37.4 % (ref 36.0–46.0)
Hemoglobin: 12.9 g/dL (ref 12.0–15.0)
Immature Granulocytes: 2 %
Lymphocytes Relative: 11 %
Lymphs Abs: 0.5 10*3/uL — ABNORMAL LOW (ref 0.7–4.0)
MCH: 33.9 pg (ref 26.0–34.0)
MCHC: 34.5 g/dL (ref 30.0–36.0)
MCV: 98.2 fL (ref 80.0–100.0)
Monocytes Absolute: 0.9 10*3/uL (ref 0.1–1.0)
Monocytes Relative: 19 %
Neutro Abs: 3.5 10*3/uL (ref 1.7–7.7)
Neutrophils Relative %: 68 %
Platelets: 59 10*3/uL — ABNORMAL LOW (ref 150–400)
RBC: 3.81 MIL/uL — ABNORMAL LOW (ref 3.87–5.11)
RDW: 12 % (ref 11.5–15.5)
WBC: 5 10*3/uL (ref 4.0–10.5)
nRBC: 0 % (ref 0.0–0.2)

## 2019-06-15 LAB — C-REACTIVE PROTEIN: CRP: 8.1 mg/dL — ABNORMAL HIGH (ref <1.0)

## 2019-06-15 LAB — PROCALCITONIN
Procalcitonin: 0.11 ng/mL
Procalcitonin: 0.13 ng/mL

## 2019-06-15 LAB — PHOSPHORUS: Phosphorus: 3 mg/dL (ref 2.5–4.6)

## 2019-06-15 LAB — FERRITIN: Ferritin: 529 ng/mL — ABNORMAL HIGH (ref 11–307)

## 2019-06-15 LAB — TROPONIN I (HIGH SENSITIVITY): Troponin I (High Sensitivity): 6 ng/L (ref <18)

## 2019-06-15 LAB — BRAIN NATRIURETIC PEPTIDE: B Natriuretic Peptide: 52.3 pg/mL (ref 0.0–100.0)

## 2019-06-15 LAB — D-DIMER, QUANTITATIVE: D-Dimer, Quant: 2.5 ug/mL-FEU — ABNORMAL HIGH (ref 0.00–0.50)

## 2019-06-15 LAB — MAGNESIUM: Magnesium: 1.7 mg/dL (ref 1.7–2.4)

## 2019-06-15 MED ORDER — ACETAMINOPHEN 325 MG PO TABS
650.0000 mg | ORAL_TABLET | Freq: Four times a day (QID) | ORAL | Status: DC | PRN
  Administered 2019-06-15 – 2019-06-25 (×15): 650 mg via ORAL
  Filled 2019-06-15 (×15): qty 2

## 2019-06-15 MED ORDER — PANTOPRAZOLE SODIUM 40 MG PO TBEC
40.0000 mg | DELAYED_RELEASE_TABLET | Freq: Every day | ORAL | Status: DC
  Administered 2019-06-15 – 2019-06-25 (×11): 40 mg via ORAL
  Filled 2019-06-15 (×11): qty 1

## 2019-06-15 MED ORDER — ASPIRIN EC 81 MG PO TBEC
81.0000 mg | DELAYED_RELEASE_TABLET | Freq: Every day | ORAL | Status: DC
  Administered 2019-06-15: 81 mg via ORAL
  Filled 2019-06-15: qty 1

## 2019-06-15 MED ORDER — SODIUM CHLORIDE 0.9 % IV SOLN: 200.0000 mg | Freq: Once | INTRAVENOUS | Status: DC

## 2019-06-15 MED ORDER — GUAIFENESIN-DM 100-10 MG/5ML PO SYRP
10.0000 mL | ORAL_SOLUTION | ORAL | Status: DC | PRN
  Administered 2019-06-15 – 2019-06-19 (×4): 10 mL via ORAL
  Filled 2019-06-15 (×7): qty 10

## 2019-06-15 MED ORDER — ROSUVASTATIN CALCIUM 5 MG PO TABS
10.0000 mg | ORAL_TABLET | Freq: Every day | ORAL | Status: DC
  Administered 2019-06-15 – 2019-06-25 (×11): 10 mg via ORAL
  Filled 2019-06-15 (×11): qty 2

## 2019-06-15 MED ORDER — HYDROCOD POLST-CPM POLST ER 10-8 MG/5ML PO SUER
5.0000 mL | Freq: Two times a day (BID) | ORAL | Status: DC | PRN
  Administered 2019-06-20: 5 mL via ORAL
  Filled 2019-06-15: qty 5

## 2019-06-15 MED ORDER — SODIUM CHLORIDE 0.9 % IV SOLN
100.0000 mg | INTRAVENOUS | Status: AC
  Administered 2019-06-15 (×2): 100 mg via INTRAVENOUS
  Filled 2019-06-15 (×2): qty 20

## 2019-06-15 MED ORDER — ASCORBIC ACID 500 MG PO TABS
500.0000 mg | ORAL_TABLET | Freq: Every day | ORAL | Status: DC
  Administered 2019-06-15 – 2019-06-25 (×11): 500 mg via ORAL
  Filled 2019-06-15 (×11): qty 1

## 2019-06-15 MED ORDER — POTASSIUM CHLORIDE 20 MEQ/15ML (10%) PO SOLN
40.0000 meq | Freq: Once | ORAL | Status: AC
  Administered 2019-06-15: 01:00:00 40 meq via ORAL
  Filled 2019-06-15: qty 30

## 2019-06-15 MED ORDER — METOPROLOL TARTRATE 50 MG PO TABS
50.0000 mg | ORAL_TABLET | Freq: Two times a day (BID) | ORAL | Status: DC
  Administered 2019-06-15 – 2019-06-16 (×4): 50 mg via ORAL
  Filled 2019-06-15: qty 2
  Filled 2019-06-15 (×3): qty 1

## 2019-06-15 MED ORDER — BENAZEPRIL HCL 40 MG PO TABS
40.0000 mg | ORAL_TABLET | Freq: Every day | ORAL | Status: DC
  Administered 2019-06-15: 40 mg via ORAL
  Filled 2019-06-15 (×3): qty 1

## 2019-06-15 MED ORDER — ROSUVASTATIN CALCIUM 5 MG PO TABS
10.0000 mg | ORAL_TABLET | Freq: Every day | ORAL | Status: DC
  Filled 2019-06-15: qty 2

## 2019-06-15 MED ORDER — SERTRALINE HCL 50 MG PO TABS
50.0000 mg | ORAL_TABLET | Freq: Every day | ORAL | Status: DC
  Filled 2019-06-15: qty 1

## 2019-06-15 MED ORDER — SODIUM CHLORIDE 0.9 % IV SOLN: 100.0000 mg | Freq: Every day | INTRAVENOUS | Status: DC

## 2019-06-15 MED ORDER — ONDANSETRON HCL 4 MG/2ML IJ SOLN
4.0000 mg | Freq: Four times a day (QID) | INTRAMUSCULAR | Status: DC | PRN
  Administered 2019-06-16 – 2019-06-25 (×3): 4 mg via INTRAVENOUS
  Filled 2019-06-15 (×3): qty 2

## 2019-06-15 MED ORDER — SODIUM CHLORIDE 0.9 % IV SOLN
100.0000 mg | Freq: Every day | INTRAVENOUS | Status: AC
  Administered 2019-06-16 – 2019-06-19 (×4): 100 mg via INTRAVENOUS
  Filled 2019-06-15 (×4): qty 20

## 2019-06-15 MED ORDER — ONDANSETRON HCL 4 MG PO TABS
4.0000 mg | ORAL_TABLET | Freq: Four times a day (QID) | ORAL | Status: DC | PRN
  Administered 2019-06-18: 4 mg via ORAL
  Filled 2019-06-15: qty 1

## 2019-06-15 MED ORDER — ALBUTEROL SULFATE HFA 108 (90 BASE) MCG/ACT IN AERS: 1.0000 | INHALATION_SPRAY | RESPIRATORY_TRACT | Status: DC | PRN

## 2019-06-15 MED ORDER — LEVOTHYROXINE SODIUM 75 MCG PO TABS
175.0000 ug | ORAL_TABLET | Freq: Every day | ORAL | Status: DC
  Administered 2019-06-15 – 2019-06-25 (×11): 175 ug via ORAL
  Filled 2019-06-15 (×12): qty 1

## 2019-06-15 MED ORDER — APIXABAN 2.5 MG PO TABS
2.5000 mg | ORAL_TABLET | Freq: Two times a day (BID) | ORAL | Status: DC
  Administered 2019-06-15 – 2019-06-18 (×7): 2.5 mg via ORAL
  Filled 2019-06-15 (×7): qty 1

## 2019-06-15 MED ORDER — DEXAMETHASONE SODIUM PHOSPHATE 10 MG/ML IJ SOLN
6.0000 mg | INTRAMUSCULAR | Status: DC
  Administered 2019-06-15 – 2019-06-18 (×4): 6 mg via INTRAVENOUS
  Filled 2019-06-15 (×4): qty 1

## 2019-06-15 MED ORDER — IPRATROPIUM-ALBUTEROL 20-100 MCG/ACT IN AERS
1.0000 | INHALATION_SPRAY | Freq: Four times a day (QID) | RESPIRATORY_TRACT | Status: DC
  Administered 2019-06-15 – 2019-06-23 (×36): 1 via RESPIRATORY_TRACT
  Filled 2019-06-15: qty 4

## 2019-06-15 MED ORDER — ZINC SULFATE 220 (50 ZN) MG PO CAPS
220.0000 mg | ORAL_CAPSULE | Freq: Every day | ORAL | Status: DC
  Administered 2019-06-15 – 2019-06-25 (×11): 220 mg via ORAL
  Filled 2019-06-15 (×11): qty 1

## 2019-06-15 MED ORDER — LORAZEPAM 0.5 MG PO TABS
0.5000 mg | ORAL_TABLET | Freq: Two times a day (BID) | ORAL | Status: DC | PRN
  Administered 2019-06-15 – 2019-06-18 (×6): 0.5 mg via ORAL
  Filled 2019-06-15 (×7): qty 1

## 2019-06-15 MED ORDER — SERTRALINE HCL 50 MG PO TABS
50.0000 mg | ORAL_TABLET | Freq: Every day | ORAL | Status: DC
  Administered 2019-06-15 – 2019-06-25 (×12): 50 mg via ORAL
  Filled 2019-06-15 (×11): qty 1

## 2019-06-15 NOTE — Plan of Care (Signed)
  Problem: Activity: Goal: Will identify at least one activity in which they can participate Outcome: Progressing   Problem: Coping: Goal: Ability to identify and develop effective coping behavior will improve Outcome: Progressing Goal: Ability to interact with others will improve Outcome: Progressing Goal: Demonstration of participation in decision-making regarding own care will improve Outcome: Progressing Goal: Ability to use eye contact when communicating with others will improve Outcome: Progressing   Problem: Health Behavior/Discharge Planning: Goal: Identification of resources available to assist in meeting health care needs will improve Outcome: Progressing   Problem: Self-Concept: Goal: Will verbalize positive feelings about self Outcome: Progressing   Problem: Education: Goal: Knowledge of General Education information will improve Description: Including pain rating scale, medication(s)/side effects and non-pharmacologic comfort measures Outcome: Progressing   Problem: Health Behavior/Discharge Planning: Goal: Ability to manage health-related needs will improve Outcome: Progressing   Problem: Clinical Measurements: Goal: Ability to maintain clinical measurements within normal limits will improve Outcome: Progressing Goal: Will remain free from infection Outcome: Progressing Goal: Diagnostic test results will improve Outcome: Progressing Goal: Respiratory complications will improve Outcome: Progressing Goal: Cardiovascular complication will be avoided Outcome: Progressing   Problem: Activity: Goal: Risk for activity intolerance will decrease Outcome: Progressing   Problem: Nutrition: Goal: Adequate nutrition will be maintained Outcome: Progressing   Problem: Coping: Goal: Level of anxiety will decrease Outcome: Progressing   Problem: Elimination: Goal: Will not experience complications related to bowel motility Outcome: Progressing Goal: Will not  experience complications related to urinary retention Outcome: Progressing   Problem: Pain Managment: Goal: General experience of comfort will improve Outcome: Progressing   Problem: Safety: Goal: Ability to remain free from injury will improve Outcome: Progressing   Problem: Skin Integrity: Goal: Risk for impaired skin integrity will decrease Outcome: Progressing

## 2019-06-15 NOTE — Progress Notes (Signed)
PROGRESS NOTE                                                                                                                                                                                                             Patient Demographics:    Breanna Hernandez, is a 74 y.o. female, DOB - [birth year], [id number]  Outpatient Primary MD for the patient is Ann Held, DO    LOS - 1  Admit date - 07/03/2019    Chief Complaint  Patient presents with  . Weakness  . Palpitations       Brief Narrative  Breanna Hernandez is a 74 y.o. female with medical history significant for CAD s/p CABG, atrial tachycardia, history of aortic dissection, history of breast cancer, history of endometrial cancer, history of thyroid cancer with postoperative hypothyroidism, hypertension, hyperlipidemia, and depression/anxiety who presents to the ED for evaluation of fevers, chills, cough, he was diagnosed with acute hypoxic respiratory failure due to COVID-19 pneumonia and admitted to the hospital.   Subjective:    [name] today has, No headache, No chest pain, No abdominal pain - No Nausea, No new weakness tingling or numbness, no cough and improved shortness of breath.   Assessment  & Plan :      1. Acute Hypoxic Resp. Failure due to Acute Covid 19 Viral Pneumonitis during the ongoing 2020 Covid 19 Pandemic - she has moderate to severe disease, has been started on IV steroids along with remdesivir, currently on 4 L nasal cannula oxygen, will be closely monitored, if hypoxia worsens Actemra will be provided.  Patient has consented for the same.  Encouraged the patient to sit up in chair in the daytime use I-S and flutter valve for pulmonary toiletry and then prone in bed when at night.  Actemra off label use - patient was told that if COVID-19 pneumonitis gets worse we might potentially use Actemra off label, patient denies any known  history of tuberculosis or hepatitis, understands the risks and benefits and wants to proceed with Actemra treatment if required.    SpO2: 91 % O2 Flow Rate (L/min): 4 L/min  Recent Labs  Lab 06/22/2019 2138 06/15/19 0320  CRP 7.1* 8.1*  DDIMER 2.41* 2.50*  FERRITIN 536* 529*  BNP  --  52.3  PROCALCITON 0.11 0.13    Hepatic Function  Latest Ref Rng & Units 06/15/2019 06/15/2019 01/01/2019  Total Protein 6.5 - 8.1 g/dL 7.1 7.8 6.9  Albumin 3.5 - 5.0 g/dL 3.2(L) 3.6 4.1  AST 15 - 41 U/L 51(H) 52(H) 31  ALT 0 - 44 U/L 31 30 27   Alk Phosphatase 38 - 126 U/L 94 101 91  Total Bilirubin 0.3 - 1.2 mg/dL 1.8(H) 1.8(H) 1.8(H)  Bilirubin, Direct 0.0 - 0.3 mg/dL - - -    2.  Mild COVID-19 viral infection related transaminitis.  Asymptomatic, trend.  3.  History of atrial tachycardia.  On Lopressor 50 daily which will be continued.   4.  Dyslipidemia.  On statin  5. CAD - CAGB - no chest pain, continue on Lopressor, rosuvastatin, holding ASA due to low platelets.  6.  Hypothyroidism.  On Synthroid continue.  7.  Anxiety and depression.  Continue combination of sertraline and as needed Ativan.  8.  Thrombocytopenia.  Question if this is due to viral illness, hold aspirin as she is on low-dose Eliquis.  Monitor closely.  Platelet count was 148 and somewhat on the lower side few years ago as well.  Question if there is some chronic element to it.  Lab Results  Component Value Date   PLT 59 (L) 06/15/2019      Condition - Extremely Guarded  Family Communication  :  Husband 3/8  Code Status :  Full  Diet :   Diet Order            Diet Heart Room service appropriate? Yes; Fluid consistency: Thin  Diet effective now               Disposition Plan  : Stay in the hospital getting treatment for severe hypoxic respiratory failure due to COVID-19 pneumonia.  Consults  : None  Procedures  : None  PUD Prophylaxis : PPI  DVT Prophylaxis  : Eliquis   Lab Results  Component  Value Date   PLT 59 (L) 06/15/2019    Inpatient Medications  Scheduled Meds: . vitamin C  500 mg Oral Daily  . aspirin EC  81 mg Oral Daily  . benazepril  40 mg Oral Daily  . dexamethasone (DECADRON) injection  6 mg Intravenous Q24H  . Ipratropium-Albuterol  1 puff Inhalation Q6H  . levothyroxine  175 mcg Oral Q0600  . metoprolol tartrate  50 mg Oral BID  . rosuvastatin  10 mg Oral QHS  . sertraline  50 mg Oral QHS  . zinc sulfate  220 mg Oral Daily   Continuous Infusions: . [START ON 06/16/2019] remdesivir 100 mg in NS 100 mL     PRN Meds:.acetaminophen, albuterol, chlorpheniramine-HYDROcodone, guaiFENesin-dextromethorphan, LORazepam, ondansetron **OR** ondansetron (ZOFRAN) IV  Antibiotics  :    Anti-infectives (From admission, onward)   Start     Dose/Rate Route Frequency Ordered Stop   06/16/19 1000  remdesivir 100 mg in sodium chloride 0.9 % 100 mL IVPB     100 mg 200 mL/hr over 30 Minutes Intravenous Daily 06/15/19 0027 06/20/19 0959   06/15/19 1000  remdesivir 100 mg in sodium chloride 0.9 % 100 mL IVPB  Status:  Discontinued     100 mg 200 mL/hr over 30 Minutes Intravenous Daily 06/15/19 0023 06/15/19 0025   06/15/19 0030  remdesivir 200 mg in sodium chloride 0.9% 250 mL IVPB  Status:  Discontinued     200 mg 580 mL/hr over 30 Minutes Intravenous Once 06/15/19 0023 06/15/19 0025   06/15/19 0030  remdesivir 100  mg in sodium chloride 0.9 % 100 mL IVPB     100 mg 200 mL/hr over 30 Minutes Intravenous Every 30 min 06/15/19 0026 06/15/19 0218       Time Spent in minutes  30   Lala Lund M.D on 06/15/2019 at 11:59 AM  To page go to [email or web address] - password [facility]  [facility] -  Office  [phone]  See all Orders from today for further details    Objective:   Vitals:   06/15/19 0130 06/15/19 0238 06/15/19 0733 06/15/19 0917  BP: (!) 143/88 122/69  115/65  Pulse: 75 100  70  Resp: 20 20    Temp:  98.4 F (36.9 C)    TempSrc:  Oral    SpO2: 95%  91% 91%   Weight:      Height:        Wt Readings from Last 3 Encounters:  06/20/2019 111.1 kg  02/19/19 108.6 kg  12/24/18 107.5 kg     Intake/Output Summary (Last 24 hours) at 06/15/2019 1159 Last data filed at 06/15/2019 0300 Gross per 24 hour  Intake 183.33 ml  Output --  Net 183.33 ml     Physical Exam  Awake Alert, No new F.N deficits, Normal affect [place].AT,PERRAL Supple Neck,No JVD, No cervical lymphadenopathy appriciated.  Symmetrical Chest wall movement, Good air movement bilaterally, CTAB RRR,No Gallops,Rubs or new Murmurs, No Parasternal Heave +ve B.Sounds, Abd Soft, No tenderness, No organomegaly appriciated, No rebound - guarding or rigidity. No Cyanosis, Clubbing or edema, No new Rash or bruise      Data Review:    CBC Recent Labs  Lab 07/05/2019 2138 06/15/19 0320  WBC 5.4 5.0  HGB 14.3 12.9  HCT 41.7 37.4  PLT 89* 59*  MCV 98.6 98.2  MCH 33.8 33.9  MCHC 34.3 34.5  RDW 12.1 12.0  LYMPHSABS 0.7 0.5*  MONOABS 1.5* 0.9  EOSABS 0.0 0.0  BASOSABS 0.0 0.0    Chemistries  Recent Labs  Lab 06/22/2019 2138 06/15/19 0320  NA 133* 135  K 3.4* 3.9  CL 94* 96*  CO2 22 26  GLUCOSE 117* 125*  BUN 16 15  CREATININE 0.94 0.97  CALCIUM 8.7* 8.6*  MG  --  1.7  AST 52* 51*  ALT 30 31  ALKPHOS 101 94  BILITOT 1.8* 1.8*   ------------------------------------------------------------------------------------------------------------------ Recent Labs    06/25/2019 2138  TRIG 130    Lab Results  Component Value Date   HGBA1C 5.8 08/17/2014   ------------------------------------------------------------------------------------------------------------------ No results for input(s): TSH, T4TOTAL, T3FREE, THYROIDAB in the last 72 hours.  Invalid input(s): FREET3  Cardiac Enzymes No results for input(s): CKMB, TROPONINI, MYOGLOBIN in the last 168 hours.  Invalid input(s):  CK ------------------------------------------------------------------------------------------------------------------    Component Value Date/Time   BNP 52.3 06/15/2019 0320    Micro Results Recent Results (from the past 240 hour(s))  Blood Culture (routine x 2)     Status: None (Preliminary result)   Collection Time: 06/11/2019  9:40 PM   Specimen: BLOOD  Result Value Ref Range Status   Specimen Description BLOOD RIGHT ANTECUBITAL  Final   Special Requests   Final    BOTTLES DRAWN AEROBIC AND ANAEROBIC Blood Culture adequate volume   Culture   Final    NO GROWTH < 12 HOURS Performed at [facility], [address] [place], [place] [address]    Report Status PENDING  Incomplete  Blood Culture (routine x 2)     Status: None (Preliminary  result)   Collection Time: 06/08/2019  9:53 PM   Specimen: BLOOD  Result Value Ref Range Status   Specimen Description BLOOD LEFT ANTECUBITAL  Final   Special Requests   Final    BOTTLES DRAWN AEROBIC AND ANAEROBIC Blood Culture adequate volume   Culture   Final    NO GROWTH < 12 HOURS Performed at [facility], [address], [place],  [address]    Report Status PENDING  Incomplete    Radiology Reports DG Chest Port 1 View  Result Date: 06/20/2019 CLINICAL DATA:  74 year old female with shortness of breath, hypoxia. EXAM: PORTABLE CHEST 1 VIEW COMPARISON:  Chest CTA 09/24/2017 and earlier. FINDINGS: Portable AP view at 2119 hours. Mildly lower lung volumes. Sequelae of CABG. Stable cardiac size and mediastinal contours. Stable thyroidectomy surgical clips. Increased crowding of lung markings at both bases. Allowing for portable technique the lungs are otherwise clear. No pneumothorax. No acute osseous abnormality identified. IMPRESSION: Lower lung volumes with mild bibasilar atelectasis. Electronically Signed   By: Genevie Ann M.D.   On: 06/29/2019 21:32

## 2019-06-15 NOTE — Progress Notes (Signed)
Spoke to pt.'s husband Kiah Campoli and updated him about the pt.'s status, all questions answered.

## 2019-06-15 NOTE — ED Notes (Signed)
First attempt to call report unsuccessful. 

## 2019-06-16 LAB — COMPREHENSIVE METABOLIC PANEL
ALT: 27 U/L (ref 0–44)
AST: 44 U/L — ABNORMAL HIGH (ref 15–41)
Albumin: 2.8 g/dL — ABNORMAL LOW (ref 3.5–5.0)
Alkaline Phosphatase: 80 U/L (ref 38–126)
Anion gap: 13 (ref 5–15)
BUN: 25 mg/dL — ABNORMAL HIGH (ref 8–23)
CO2: 26 mmol/L (ref 22–32)
Calcium: 8.4 mg/dL — ABNORMAL LOW (ref 8.9–10.3)
Chloride: 95 mmol/L — ABNORMAL LOW (ref 98–111)
Creatinine, Ser: 1.05 mg/dL — ABNORMAL HIGH (ref 0.44–1.00)
GFR calc Af Amer: >60 mL/min (ref >60)
GFR calc non Af Amer: 52 mL/min — ABNORMAL LOW (ref >60)
Glucose, Bld: 132 mg/dL — ABNORMAL HIGH (ref 70–99)
Potassium: 3.6 mmol/L (ref 3.5–5.1)
Sodium: 134 mmol/L — ABNORMAL LOW (ref 135–145)
Total Bilirubin: 1.2 mg/dL (ref 0.3–1.2)
Total Protein: 5.8 g/dL — ABNORMAL LOW (ref 6.5–8.1)

## 2019-06-16 LAB — CBC
HCT: 38.3 % (ref 36.0–46.0)
Hemoglobin: 13.6 g/dL (ref 12.0–15.0)
MCH: 34.3 pg — ABNORMAL HIGH (ref 26.0–34.0)
MCHC: 35.5 g/dL (ref 30.0–36.0)
MCV: 96.5 fL (ref 80.0–100.0)
Platelets: 103 10*3/uL — ABNORMAL LOW (ref 150–400)
RBC: 3.97 MIL/uL (ref 3.87–5.11)
RDW: 11.9 % (ref 11.5–15.5)
WBC: 10.3 10*3/uL (ref 4.0–10.5)
nRBC: 0 % (ref 0.0–0.2)

## 2019-06-16 LAB — PROCALCITONIN: Procalcitonin: 0.12 ng/mL

## 2019-06-16 LAB — CBC WITH DIFFERENTIAL/PLATELET
Abs Immature Granulocytes: 0.12 10*3/uL — ABNORMAL HIGH (ref 0.00–0.07)
Basophils Absolute: 0 10*3/uL (ref 0.0–0.1)
Basophils Relative: 0 %
Eosinophils Absolute: 0 10*3/uL (ref 0.0–0.5)
Eosinophils Relative: 0 %
HCT: 36 % (ref 36.0–46.0)
Hemoglobin: 12.5 g/dL (ref 12.0–15.0)
Immature Granulocytes: 1 %
Lymphocytes Relative: 8 %
Lymphs Abs: 0.7 10*3/uL (ref 0.7–4.0)
MCH: 33.6 pg (ref 26.0–34.0)
MCHC: 34.7 g/dL (ref 30.0–36.0)
MCV: 96.8 fL (ref 80.0–100.0)
Monocytes Absolute: 2.3 10*3/uL — ABNORMAL HIGH (ref 0.1–1.0)
Monocytes Relative: 25 %
Neutro Abs: 6 10*3/uL (ref 1.7–7.7)
Neutrophils Relative %: 66 %
Platelets: UNDETERMINED 10*3/uL (ref 150–400)
RBC: 3.72 MIL/uL — ABNORMAL LOW (ref 3.87–5.11)
RDW: 11.9 % (ref 11.5–15.5)
WBC: 9.2 10*3/uL (ref 4.0–10.5)
nRBC: 0 % (ref 0.0–0.2)

## 2019-06-16 LAB — URINALYSIS, ROUTINE W REFLEX MICROSCOPIC
Bilirubin Urine: NEGATIVE
Glucose, UA: NEGATIVE mg/dL
Hgb urine dipstick: NEGATIVE
Ketones, ur: NEGATIVE mg/dL
Leukocytes,Ua: NEGATIVE
Nitrite: NEGATIVE
Protein, ur: NEGATIVE mg/dL
Specific Gravity, Urine: 1.014 (ref 1.005–1.030)
pH: 5 (ref 5.0–8.0)

## 2019-06-16 LAB — C-REACTIVE PROTEIN: CRP: 5.8 mg/dL — ABNORMAL HIGH (ref <1.0)

## 2019-06-16 LAB — BRAIN NATRIURETIC PEPTIDE: B Natriuretic Peptide: 91 pg/mL (ref 0.0–100.0)

## 2019-06-16 LAB — MAGNESIUM: Magnesium: 1.8 mg/dL (ref 1.7–2.4)

## 2019-06-16 LAB — D-DIMER, QUANTITATIVE: D-Dimer, Quant: 1.85 ug/mL-FEU — ABNORMAL HIGH (ref 0.00–0.50)

## 2019-06-16 MED ORDER — DOCUSATE SODIUM 100 MG PO CAPS
200.0000 mg | ORAL_CAPSULE | Freq: Two times a day (BID) | ORAL | Status: DC
  Administered 2019-06-16 – 2019-06-25 (×16): 200 mg via ORAL
  Filled 2019-06-16 (×17): qty 2

## 2019-06-16 MED ORDER — METOPROLOL TARTRATE 25 MG PO TABS
25.0000 mg | ORAL_TABLET | Freq: Two times a day (BID) | ORAL | Status: DC
  Administered 2019-06-17 – 2019-06-20 (×7): 25 mg via ORAL
  Filled 2019-06-16 (×7): qty 1

## 2019-06-16 MED ORDER — PHENOL 1.4 % MT LIQD
1.0000 | OROMUCOSAL | Status: DC | PRN
  Administered 2019-06-16: 1 via OROMUCOSAL
  Filled 2019-06-16: qty 177

## 2019-06-16 MED ORDER — ORAL CARE MOUTH RINSE
15.0000 mL | Freq: Two times a day (BID) | OROMUCOSAL | Status: DC
  Administered 2019-06-17 – 2019-06-25 (×16): 15 mL via OROMUCOSAL

## 2019-06-16 MED ORDER — LACTATED RINGERS IV SOLN: INTRAVENOUS | Status: AC

## 2019-06-16 MED ORDER — LACTATED RINGERS IV SOLN: INTRAVENOUS | Status: DC

## 2019-06-16 NOTE — Progress Notes (Signed)
   06/15/19 2228  MEWS Score  Temp 98 F (36.7 C)  BP (!) 95/49  Pulse Rate 72  ECG Heart Rate 72  Resp (!) 23  SpO2 90 %  O2 Device Nasal Cannula  Patient Activity (if Appropriate) In bed  O2 Flow Rate (L/min) 3 L/min  MEWS Score  MEWS Temp 0  MEWS Systolic 1  MEWS Pulse 0  MEWS RR 1  MEWS LOC 0  MEWS Score 2  MEWS Score Color Yellow  MEWS Assessment  Is this an acute change? No  Provider Notification  Provider Name/Title Baltazar Najjar, NP  Date Provider Notified 06/15/19  Time Provider Notified 2234 (2nd page 2325.)  Notification Type Page  Notification Reason Other (Comment) (Verification whether or not to give HS Metoprolol)  Response See new orders (Hold parameters added to Metoprolol.)  Date of Provider Response 06/15/19  Time of Provider Response 2321   BP 101/54 on right arm. Metoprolol given per parameters. Will continue to monitor.

## 2019-06-16 NOTE — Progress Notes (Signed)
PROGRESS NOTE                                                                                                                                                                                                             Patient Demographics:    Breanna Hernandez, is a 74 y.o. female, DOB - 1945/08/14, KNL:[id number]  Outpatient Primary MD for the patient is Ann Held, DO    LOS - 2  Admit date - 07/07/2019    Chief Complaint  Patient presents with  . Weakness  . Palpitations       Brief Narrative  Breanna Hernandez is a 74 y.o. female with medical history significant for CAD s/p CABG, atrial tachycardia, history of aortic dissection, history of breast cancer, history of endometrial cancer, history of thyroid cancer with postoperative hypothyroidism, hypertension, hyperlipidemia, and depression/anxiety who presents to the ED for evaluation of fevers, chills, cough, he was diagnosed with acute hypoxic respiratory failure due to COVID-19 pneumonia and admitted to the hospital.   Subjective:   Patient in bed, appears comfortable, denies any headache, no fever, no chest pain or pressure, no shortness of breath , no abdominal pain. No focal weakness. Feels little weak overall today.   Assessment  & Plan :      1. Acute Hypoxic Resp. Failure due to Acute Covid 19 Viral Pneumonitis during the ongoing 2020 Covid 19 Pandemic - she has moderate to severe disease, has been started on IV steroids along with remdesivir, currently on 4 L nasal cannula oxygen, will be closely monitored, if hypoxia worsens Actemra will be provided.  Patient has consented for the same.  Encouraged the patient to sit up in chair in the daytime use I-S and flutter valve for pulmonary toiletry and then prone in bed when at night.  Actemra off label use - patient was told that if COVID-19 pneumonitis gets worse we might potentially use Actemra off label,  patient denies any known history of tuberculosis or hepatitis, understands the risks and benefits and wants to proceed with Actemra treatment if required.    SpO2: 94 % O2 Flow Rate (L/min): 3 L/min  Recent Labs  Lab 06/13/2019 2138 06/15/19 0320 06/16/19 0405  CRP 7.1* 8.1* 5.8*  DDIMER 2.41* 2.50* 1.85*  FERRITIN 536* 529*  --   BNP  --  52.3  91.0  PROCALCITON 0.11 0.13 0.12    Hepatic Function Latest Ref Rng & Units 06/16/2019 06/15/2019 07/04/2019  Total Protein 6.5 - 8.1 g/dL 5.8(L) 7.1 7.8  Albumin 3.5 - 5.0 g/dL 2.8(L) 3.2(L) 3.6  AST 15 - 41 U/L 44(H) 51(H) 52(H)  ALT 0 - 44 U/L 27 31 30   Alk Phosphatase 38 - 126 U/L 80 94 101  Total Bilirubin 0.3 - 1.2 mg/dL 1.2 1.8(H) 1.8(H)  Bilirubin, Direct 0.0 - 0.3 mg/dL - - -    2.  Mild COVID-19 viral infection related transaminitis.  Asymptomatic, trend.  3.  History of atrial tachycardia.  On Lopressor 50 daily which will be continued.   4.  Dyslipidemia.  On statin  5. CAD - CAGB - no chest pain, continue on Lopressor, rosuvastatin, holding ASA due to low platelets.  6.  Hypothyroidism.  On Synthroid continue.  7.  Anxiety and depression.  Continue combination of sertraline and as needed Ativan.  8.  Dehydration, generalized weakness, AKI and hypotension.  Blood pressure medications are adjusted, ARB discontinued, skip her evening beta-blocker, hydrate with IV fluids and monitor.  PT-OT eval.  9. Thrombocytopenia.  Question if this is due to viral illness, hold aspirin as she is on low-dose Eliquis.  Monitor closely.  Platelet count was 148 and somewhat on the lower side few years ago as well.  Question if there is some chronic element to it.  Today's platelet count inconclusive as blood specimen clamped, repeat CBC stat on 06/16/2019 along with pathologist smear evaluation.  Lab Results  Component Value Date   PLT PLATELET CLUMPS NOTED ON SMEAR, UNABLE TO ESTIMATE 06/16/2019      Condition - Extremely Guarded  Family  Communication  :  Husband 3/8, 3/9  Code Status :  Full  Diet :   Diet Order            Diet Heart Room service appropriate? Yes; Fluid consistency: Thin  Diet effective now               Disposition Plan  : Stay in the hospital getting treatment for severe hypoxic respiratory failure due to COVID-19 pneumonia.  Consults  : None  Procedures  : None  PUD Prophylaxis : PPI  DVT Prophylaxis  : Eliquis   Lab Results  Component Value Date   PLT PLATELET CLUMPS NOTED ON SMEAR, UNABLE TO ESTIMATE 06/16/2019    Inpatient Medications  Scheduled Meds: . apixaban  2.5 mg Oral BID  . vitamin C  500 mg Oral Daily  . dexamethasone (DECADRON) injection  6 mg Intravenous Q24H  . Ipratropium-Albuterol  1 puff Inhalation Q6H  . levothyroxine  175 mcg Oral Q0600  . mouth rinse  15 mL Mouth Rinse BID  . [START ON 06/17/2019] metoprolol tartrate  25 mg Oral BID  . pantoprazole  40 mg Oral Daily  . rosuvastatin  10 mg Oral QHS  . sertraline  50 mg Oral QHS  . zinc sulfate  220 mg Oral Daily   Continuous Infusions: . lactated ringers    . remdesivir 100 mg in NS 100 mL 100 mg (06/16/19 0922)   PRN Meds:.acetaminophen, albuterol, chlorpheniramine-HYDROcodone, guaiFENesin-dextromethorphan, LORazepam, ondansetron **OR** ondansetron (ZOFRAN) IV  Antibiotics  :    Anti-infectives (From admission, onward)   Start     Dose/Rate Route Frequency Ordered Stop   06/16/19 1000  remdesivir 100 mg in sodium chloride 0.9 % 100 mL IVPB     100 mg 200 mL/hr  over 30 Minutes Intravenous Daily 06/15/19 0027 06/20/19 0959   06/15/19 1000  remdesivir 100 mg in sodium chloride 0.9 % 100 mL IVPB  Status:  Discontinued     100 mg 200 mL/hr over 30 Minutes Intravenous Daily 06/15/19 0023 06/15/19 0025   06/15/19 0030  remdesivir 200 mg in sodium chloride 0.9% 250 mL IVPB  Status:  Discontinued     200 mg 580 mL/hr over 30 Minutes Intravenous Once 06/15/19 0023 06/15/19 0025   06/15/19 0030  remdesivir  100 mg in sodium chloride 0.9 % 100 mL IVPB     100 mg 200 mL/hr over 30 Minutes Intravenous Every 30 min 06/15/19 0026 06/15/19 0218       Time Spent in minutes  30   Lala Lund M.D on 06/16/2019 at 2:05 PM  To page go to [email or web address] - password [facility]  [facility] -  Office  [phone]  See all Orders from today for further details    Objective:   Vitals:   06/16/19 0710 06/16/19 0900 06/16/19 0906 06/16/19 1200  BP:   (!) 107/57   Pulse: 73 73    Resp: 20 (!) 32 (!) 23   Temp:   (!) 97.3 F (36.3 C) 97.6 F (36.4 C)  TempSrc:   Oral Oral  SpO2: 92% 93% 94%   Weight:      Height:        Wt Readings from Last 3 Encounters:  06/22/2019 111.1 kg  02/19/19 108.6 kg  12/24/18 107.5 kg     Intake/Output Summary (Last 24 hours) at 06/16/2019 1405 Last data filed at 06/16/2019 1000 Gross per 24 hour  Intake 716.61 ml  Output 700 ml  Net 16.61 ml     Physical Exam  Awake Alert, No new F.N deficits, Normal affect [place].AT,PERRAL Supple Neck,No JVD, No cervical lymphadenopathy appriciated.  Symmetrical Chest wall movement, Good air movement bilaterally, CTAB RRR,No Gallops, Rubs or new Murmurs, No Parasternal Heave +ve B.Sounds, Abd Soft, No tenderness, No organomegaly appriciated, No rebound - guarding or rigidity. No Cyanosis, Clubbing or edema,      Data Review:    CBC Recent Labs  Lab 06/11/2019 2138 06/15/19 0320 06/16/19 0405  WBC 5.4 5.0 9.2  HGB 14.3 12.9 12.5  HCT 41.7 37.4 36.0  PLT 89* 59* PLATELET CLUMPS NOTED ON SMEAR, UNABLE TO ESTIMATE  MCV 98.6 98.2 96.8  MCH 33.8 33.9 33.6  MCHC 34.3 34.5 34.7  RDW 12.1 12.0 11.9  LYMPHSABS 0.7 0.5* 0.7  MONOABS 1.5* 0.9 2.3*  EOSABS 0.0 0.0 0.0  BASOSABS 0.0 0.0 0.0    Chemistries  Recent Labs  Lab 07/05/2019 2138 06/15/19 0320 06/16/19 0405  NA 133* 135 134*  K 3.4* 3.9 3.6  CL 94* 96* 95*  CO2 22 26 26   GLUCOSE 117* 125* 132*  BUN 16 15 25*  CREATININE 0.94 0.97 1.05*  CALCIUM  8.7* 8.6* 8.4*  MG  --  1.7 1.8  AST 52* 51* 44*  ALT 30 31 27   ALKPHOS 101 94 80  BILITOT 1.8* 1.8* 1.2   ------------------------------------------------------------------------------------------------------------------ Recent Labs    06/30/2019 2138  TRIG 130    Lab Results  Component Value Date   HGBA1C 5.8 08/17/2014   ------------------------------------------------------------------------------------------------------------------ No results for input(s): TSH, T4TOTAL, T3FREE, THYROIDAB in the last 72 hours.  Invalid input(s): FREET3  Cardiac Enzymes No results for input(s): CKMB, TROPONINI, MYOGLOBIN in the last 168 hours.  Invalid input(s): CK ------------------------------------------------------------------------------------------------------------------    Component  Value Date/Time   BNP 91.0 06/16/2019 0405    Micro Results Recent Results (from the past 240 hour(s))  Blood Culture (routine x 2)     Status: None (Preliminary result)   Collection Time: 06/20/2019  9:40 PM   Specimen: BLOOD  Result Value Ref Range Status   Specimen Description BLOOD RIGHT ANTECUBITAL  Final   Special Requests   Final    BOTTLES DRAWN AEROBIC AND ANAEROBIC Blood Culture adequate volume   Culture   Final    NO GROWTH 2 DAYS Performed at [facility], [address], [place], [place] [address]    Report Status PENDING  Incomplete  Blood Culture (routine x 2)     Status: None (Preliminary result)   Collection Time: 06/29/2019  9:53 PM   Specimen: BLOOD  Result Value Ref Range Status   Specimen Description BLOOD LEFT ANTECUBITAL  Final   Special Requests   Final    BOTTLES DRAWN AEROBIC AND ANAEROBIC Blood Culture adequate volume   Culture   Final    NO GROWTH 2 DAYS Performed at [facility], [place] [address] [place], [place] [address]    Report Status PENDING  Incomplete    Radiology Reports DG Chest Port 1 View  Result Date: 06/08/2019 CLINICAL DATA:   74 year old female with shortness of breath, hypoxia. EXAM: PORTABLE CHEST 1 VIEW COMPARISON:  Chest CTA 09/24/2017 and earlier. FINDINGS: Portable AP view at 2119 hours. Mildly lower lung volumes. Sequelae of CABG. Stable cardiac size and mediastinal contours. Stable thyroidectomy surgical clips. Increased crowding of lung markings at both bases. Allowing for portable technique the lungs are otherwise clear. No pneumothorax. No acute osseous abnormality identified. IMPRESSION: Lower lung volumes with mild bibasilar atelectasis. Electronically Signed   By: Genevie Ann M.D.   On: 07/04/2019 21:32

## 2019-06-16 NOTE — Progress Notes (Signed)
Physical Therapy Evaluation  Patient limited in evaluation due to headache, nausea, dizziness in standing, and low BP. BP 84/68 after standing first trial. Patient unable to stand long enough to capture BP while standing. Patient also tremulous in standing (due to medication? Vs dizziness?). Anticipate with continued medical management, patient's medical status will improve and her mobility will progress enough for her to discharge home with home PT. Further recommendations TBD as patient able to tolerate more activity. She was only able to tolerate standing during session.    06/16/19 0955  PT Visit Information  Last PT Received On 06/16/19  Assistance Needed +1  Reason for Co-Treatment For patient/therapist safety;Complexity of the patient's impairments (multi-system involvement);Other (comment) (decreased tolerance for two separate sessions)  History of Present Illness 74 year old female admitted 06/30/2019 with fevers, chills, cough and admitted with respiratory failure and gastroenteritis due to COVID requiring supplmental oxygen. PMH: CAD s/p CABG, atrial tachycardia, aortic dissection, breast cancer, emdometrial cancer, thyroid cancer wtih postoperative hypothyroidism, HTN, hyperlipidemia, depression, anxiety.   Precautions  Precautions Fall;Other (comment)  Precaution Comments monitor BP and oxygen saturation  Restrictions  Weight Bearing Restrictions No  Home Living  Family/patient expects to be discharged to: Private residence  Living Arrangements Spouse/significant other  Type of Colon to enter  Entrance Stairs-Number of Steps 2  Entrance Stairs-Rails None  Home Layout Able to live on main level with bedroom/bathroom (does not go to 2nd level)  Bathroom Shower/Tub Walk-in shower  [place] - 2 wheels;Grab bars - tub/shower;Shower seat  Additional Comments Reports neuropathy but no falls in last year.  Prior  Function  Level of Independence Independent  Comments independent household distances, hasn't left the house much in the last year, has a RW but does not use, walk in shower with shower chair and GBs (no home O2)  Communication  Communication No difficulties  Pain Assessment  Pain Assessment Faces  Pain Score  (headache, did not rate at rest)  Faces Pain Scale 4  Pain Location headache but does not rate, reports it is tolerable at rest   Pain Intervention(s) Limited activity within patient's tolerance;Monitored during session  Cognition  Arousal/Alertness Awake/alert;Lethargic  Behavior During Therapy [facility] for tasks assessed/performed  General Comments Less alert after standing due to nausea and headache.  Lower Extremity Assessment  Lower Extremity Assessment Generalized weakness;LLE deficits/detail;RLE deficits/detail  RLE Deficits / Details BLE strength grossly 4/5  RLE Sensation decreased light touch;history of peripheral neuropathy  LLE Deficits / Details BLE strength grossly 4/5  LLE Sensation decreased light touch;history of peripheral neuropathy  Bed Mobility  General bed mobility comments Patient already OOB in recliner chair.  Transfers  Overall transfer level Needs assistance  Equipment used None  Transfers Sit to/from Stand  Sit to Stand Supervision  General transfer comment 2 times sit<>stand from recliner chair, patient limited by nausea and dizziness, drop in BP in standing, supervision to ensrue safety  Ambulation/Gait  General Gait Details ambulation deferred at this time due to patient's level of nausea, tremulous in standing, low BP  Balance  Overall balance assessment Needs assistance  Standing balance support No upper extremity supported  Standing balance-Leahy Scale Fair  Standing balance comment tremulous in standing  General Comments  General comments (skin integrity, edema, etc.) Patient on 3L supplmental oxygen via [place]. Oxygen saturation down to 83% with  standing trials, recovers with seated rest. BP 84/68 after standing first trial (down  from 92/51 pre-standing). BP 97/56 after 2nd standing trial. Patient unable to stand long enough to capture BP while standing.  PT - End of Session  Equipment Utilized During Treatment Oxygen  Activity Tolerance Patient limited by fatigue;Treatment limited secondary to medical complications (Comment) (nausea, low BP)  Patient left in chair;with call bell/phone within reach;with chair alarm set  Nurse Communication Mobility status;Other (comment) (response to evaluation via secure chat)  PT Assessment  PT Recommendation/Assessment Patient needs continued PT services  PT Visit Diagnosis Unsteadiness on feet (R26.81);Other abnormalities of gait and mobility (R26.89)  PT Problem List Decreased strength;Decreased activity tolerance;Decreased balance;Decreased mobility;Cardiopulmonary status limiting activity  PT Plan  PT Frequency (ACUTE ONLY) Min 3X/week  PT Treatment/Interventions (ACUTE ONLY) DME instruction;Gait training;Stair training;Functional mobility training;Therapeutic activities;Therapeutic exercise;Balance training;Patient/family education  AM-PAC PT "6 Clicks" Mobility Outcome Measure (Version 2)  Help needed turning from your back to your side while in a flat bed without using bedrails? 3  Help needed moving from lying on your back to sitting on the side of a flat bed without using bedrails? 3  Help needed moving to and from a bed to a chair (including a wheelchair)? 3  Help needed standing up from a chair using your arms (e.g., wheelchair or bedside chair)? 3  Help needed to walk in hospital room? 3  Help needed climbing 3-5 steps with a railing?  2  6 Click Score 17  Consider Recommendation of Discharge To: Home with [facility]  PT Recommendation  Follow Up Recommendations Home health PT  PT equipment  (TBD upon further mobility assessment)  Acute Rehab PT Goals  Time For Goal Achievement 06/29/19   Potential to Achieve Goals Good  PT Time Calculation  PT Start Time (ACUTE ONLY) 0955  PT Stop Time (ACUTE ONLY) 1017  PT Time Calculation (min) (ACUTE ONLY) 22 min  PT General Charges  $$ ACUTE PT VISIT 1 Visit  PT Evaluation  $PT Eval Moderate Complexity 1 Mod   Birdie Hopes, DPT, PT Acute Rehab [phone] office

## 2019-06-16 NOTE — Progress Notes (Signed)
   06/16/19 0650  Vitals  Pulse Rate 95  ECG Heart Rate 96  Resp 19  Oxygen Therapy  SpO2 (!) 86 %  O2 Device Nasal Cannula  O2 Flow Rate (L/min) 3 L/min  Patient Activity (if Appropriate) In chair  POSS Scale ([place] Opioid Sedation Scale)  POSS *See Group Information* 1-Acceptable,Awake and alert  MEWS Score  MEWS Temp 0  MEWS Systolic 0  MEWS Pulse 0  MEWS RR 0  MEWS LOC 0  MEWS Score 0  MEWS Score Color Green   Patient transferred from bed to [facility] to chair. Patient tolerated fair, denied dizziness with transfer. SpO2 recovered within 15 minutes. Will continue to monitor.

## 2019-06-16 NOTE — Progress Notes (Signed)
 Occupational Therapy Evaluation Patient Details Name: Breanna Hernandez MRN: [id number] DOB: 04/14/1945 Today's Date: 06/16/2019    History of Present Illness Breanna Hernandez is a 74 y.o. female with medical history significant for CAD s/p CABG, atrial tachycardia, history of aortic dissection, history of breast cancer, history of endometrial cancer, history of thyroid cancer with postoperative hypothyroidism, hypertension, hyperlipidemia, and depression/anxiety who presents to the ED for evaluation of fevers, chills, cough, he was diagnosed with acute hypoxic respiratory failure due to COVID-19 pneumonia and admitted to the hospital.   Clinical Impression   PTA, pt lives with spouse and was Independent with all ADLs, IADLs, and mobility without AD. Presently, pt received on 3 L O2 (did not use at baseline) and reporting head pain, nausea. O2 stats at rest were 94% on 3 L O2. After completion of sit to stand trials without AD at Supervision level, pt reports dizziness. BP at rest was 92/51. In standing, BP was 84/68. Due to symptoms and low BP, deferred mobility until next session. O2 stats also dropped to 83% on 3L, after standing trial. With seated rest break and pursed lip breathing cues, pt recovered within 30 seconds > 90%. Pt Max A to adjust grip socks while seated, setup for grooming task and placement of cool washcloth on forehead.     Follow Up Recommendations  Home health OT;Supervision/Assistance - 24 hour    Equipment Recommendations  Other (comment)(Defer to next venue)    Recommendations for Other Services       Precautions / Restrictions Precautions Precautions: Fall;Other (comment)(airborne) Precaution Comments: monitor BP and oxygen Restrictions Weight Bearing Restrictions: No      Mobility Bed Mobility               General bed mobility comments: pt received in recliner chair   Transfers Overall transfer level: Needs assistance Equipment used: None Transfers:  Sit to/from Stand Sit to Stand: Supervision         General transfer comment: Supervision due to reports of dizziness, low BP readings to ensure safety     Balance Overall balance assessment: Needs assistance Sitting-balance support: Feet supported Sitting balance-Leahy Scale: Good     Standing balance support: No upper extremity supported Standing balance-Leahy Scale: Fair                             ADL either performed or assessed with clinical judgement   ADL Overall ADL's : Needs assistance/impaired Eating/Feeding: Set up;Sitting   Grooming: Set up;Wash/dry face;Sitting Grooming Details (indicate cue type and reason): Setup for washing face, placing cool washcloth across forehead Upper Body Bathing: Minimal assistance   Lower Body Bathing: Moderate assistance;Sit to/from stand;Sitting/lateral leans   Upper Body Dressing : Minimal assistance;Sitting   Lower Body Dressing: Moderate assistance;Sit to/from stand;Sitting/lateral leans Lower Body Dressing Details (indicate cue type and reason): Pt Max A to adjust grip socks due to nausea/headache.  Toilet Transfer: Minimal assistance   Toileting- Water quality scientist and Hygiene: Moderate assistance               Vision         Perception     Praxis      Pertinent Vitals/Pain Pain Assessment: Faces Faces Pain Scale: Hurts little more Pain Location: headache but does not rate, reports it is tolerable at rest  Pain Intervention(s): Limited activity within patient's tolerance;Monitored during session     Hand Dominance Right   Extremity/Trunk Assessment Upper  Extremity Assessment Upper Extremity Assessment: Overall WFL for tasks assessed   Lower Extremity Assessment Lower Extremity Assessment: Defer to PT evaluation       Communication Communication Communication: No difficulties   Cognition Arousal/Alertness: Awake/alert;Lethargic Behavior During Therapy: WFL for tasks  assessed/performed Overall Cognitive Status: Within Functional Limits for tasks assessed                                 General Comments: Pt answered questions and followed directions appropriately. Pt lethargic, reports of nausea and headache    General Comments  Pt received on 3 L O2 with 94% at rest, 85% after standing trials, recovering within 30 seconds rest break. Sitting BP at 92/51, 84/68 in standing.     Exercises Exercises: Other exercises Other Exercises Other Exercises: pursed lip breathing    Shoulder Instructions      Home Living Family/patient expects to be discharged to:: Private residence Living Arrangements: Spouse/significant other Available Help at Discharge: Family Type of Home: House Home Access: Stairs to enter Technical  of Steps: 2 Entrance Stairs-Rails: None Home Layout: Able to live on main level with bedroom/bathroom     Bathroom Shower/Tub: Occupational psychologist: Handicapped height     Home Equipment: Environmental consultant - 2 wheels;Grab bars - tub/shower;Shower seat   Additional Comments: Reports neuropathy but no falls in last year.      Prior Functioning/Environment Level of Independence: Independent        Comments: independent household distances, hasn't left the house much in the last year, has a RW but does not use, walk in shower with shower chair and GBs(no home O2)        OT Problem List: Decreased activity tolerance;Impaired balance (sitting and/or standing);Cardiopulmonary status limiting activity      OT Treatment/Interventions: Self-care/ADL training;Therapeutic exercise;Energy conservation;Therapeutic activities;Patient/family education    OT Goals(Current goals can be found in the care plan section) Acute Rehab OT Goals Patient Stated Goal: feel well enough to go home  OT Goal Formulation: With patient Time For Goal Achievement: 06/30/19 Potential to Achieve Goals: Good ADL Goals Pt Will  Perform Grooming: with modified independence;standing Pt Will Perform Lower Body Bathing: with modified independence;sitting/lateral leans;sit to/from stand Pt Will Perform Lower Body Dressing: with modified independence;sit to/from stand;sitting/lateral leans Pt Will Transfer to Toilet: with supervision;ambulating;regular height toilet Pt Will Perform Toileting - Clothing Manipulation and hygiene: with modified independence;sit to/from stand Additional ADL Goal #1: Pt will verbalize at least 3 energy conservation strategies in order to maximize ADL independence.  OT Frequency: Min 3X/week   Barriers to D/C:            Co-evaluation PT/OT/SLP Co-Evaluation/Treatment: Yes Reason for Co-Treatment: Complexity of the patient's impairments (multi-system involvement);For patient/therapist safety   OT goals addressed during session: ADL's and self-care;Other (comment)(ADL transfers)      AM-PAC OT "6 Clicks" Daily Activity     Outcome Measure Help from another person eating meals?: A Little Help from another person taking care of personal grooming?: A Little Help from another person toileting, which includes using toliet, bedpan, or urinal?: A Little Help from another person bathing (including washing, rinsing, drying)?: A Little Help from another person to put on and taking off regular upper body clothing?: A Little Help from another person to put on and taking off regular lower body clothing?: A Little 6 Click Score: 18   End of Session Equipment Utilized  During Treatment: Oxygen Nurse Communication: Mobility status;Other (comment)(BPs, request for medication )  Activity Tolerance: Patient limited by fatigue;Other (comment)(Limited by low BP and nausea ) Patient left: in chair;with call bell/phone within reach;with chair alarm set  OT Visit Diagnosis: Unsteadiness on feet (R26.81);Muscle weakness (generalized) (M62.81)                Time: [id number] OT Time Calculation (min): 22  min Charges:  OT General Charges $OT Visit: 1 Visit OT Evaluation $OT Eval Moderate Complexity: 1 Mod  Layla Maw, OTR/L  Layla Maw 06/16/2019, 2:16 PM

## 2019-06-16 NOTE — Progress Notes (Signed)
Patient A&O x4, status update given. Questions answered. Patient verbalized understanding. Patient pleasant and cooperative with assessment and meds. Patient given saltine crackers d/t c/o stomach burning after HS meds. Patient able to reposition self at this time. Patient feeling lightheaded with movement and when up, so patient assisted to bedpan, not BSC at this time. Bed alarm on and call light within reach. Will continue to monitor.

## 2019-06-17 DIAGNOSIS — I471 Supraventricular tachycardia: Secondary | ICD-10-CM

## 2019-06-17 LAB — CBC WITH DIFFERENTIAL/PLATELET
Abs Immature Granulocytes: 0.2 10*3/uL — ABNORMAL HIGH (ref 0.00–0.07)
Basophils Absolute: 0 10*3/uL (ref 0.0–0.1)
Basophils Relative: 0 %
Eosinophils Absolute: 0 10*3/uL (ref 0.0–0.5)
Eosinophils Relative: 0 %
HCT: 35.4 % — ABNORMAL LOW (ref 36.0–46.0)
Hemoglobin: 12.2 g/dL (ref 12.0–15.0)
Immature Granulocytes: 3 %
Lymphocytes Relative: 6 %
Lymphs Abs: 0.5 10*3/uL — ABNORMAL LOW (ref 0.7–4.0)
MCH: 33.2 pg (ref 26.0–34.0)
MCHC: 34.5 g/dL (ref 30.0–36.0)
MCV: 96.5 fL (ref 80.0–100.0)
Monocytes Absolute: 1.5 10*3/uL — ABNORMAL HIGH (ref 0.1–1.0)
Monocytes Relative: 19 %
Neutro Abs: 5.7 10*3/uL (ref 1.7–7.7)
Neutrophils Relative %: 72 %
Platelets: 121 10*3/uL — ABNORMAL LOW (ref 150–400)
RBC: 3.67 MIL/uL — ABNORMAL LOW (ref 3.87–5.11)
RDW: 11.9 % (ref 11.5–15.5)
WBC: 7.9 10*3/uL (ref 4.0–10.5)
nRBC: 0 % (ref 0.0–0.2)

## 2019-06-17 LAB — COMPREHENSIVE METABOLIC PANEL
ALT: 30 U/L (ref 0–44)
AST: 53 U/L — ABNORMAL HIGH (ref 15–41)
Albumin: 2.9 g/dL — ABNORMAL LOW (ref 3.5–5.0)
Alkaline Phosphatase: 82 U/L (ref 38–126)
Anion gap: 12 (ref 5–15)
BUN: 20 mg/dL (ref 8–23)
CO2: 24 mmol/L (ref 22–32)
Calcium: 8.2 mg/dL — ABNORMAL LOW (ref 8.9–10.3)
Chloride: 100 mmol/L (ref 98–111)
Creatinine, Ser: 0.95 mg/dL (ref 0.44–1.00)
GFR calc Af Amer: >60 mL/min (ref >60)
GFR calc non Af Amer: 59 mL/min — ABNORMAL LOW (ref >60)
Glucose, Bld: 130 mg/dL — ABNORMAL HIGH (ref 70–99)
Potassium: 3.4 mmol/L — ABNORMAL LOW (ref 3.5–5.1)
Sodium: 136 mmol/L (ref 135–145)
Total Bilirubin: 1.5 mg/dL — ABNORMAL HIGH (ref 0.3–1.2)
Total Protein: 6.4 g/dL — ABNORMAL LOW (ref 6.5–8.1)

## 2019-06-17 LAB — MAGNESIUM: Magnesium: 1.6 mg/dL — ABNORMAL LOW (ref 1.7–2.4)

## 2019-06-17 LAB — C-REACTIVE PROTEIN: CRP: 4.9 mg/dL — ABNORMAL HIGH (ref <1.0)

## 2019-06-17 LAB — BRAIN NATRIURETIC PEPTIDE: B Natriuretic Peptide: 88.3 pg/mL (ref 0.0–100.0)

## 2019-06-17 LAB — D-DIMER, QUANTITATIVE: D-Dimer, Quant: 2.29 ug/mL-FEU — ABNORMAL HIGH (ref 0.00–0.50)

## 2019-06-17 LAB — PROCALCITONIN: Procalcitonin: <0.1 ng/mL

## 2019-06-17 LAB — PATHOLOGIST SMEAR REVIEW

## 2019-06-17 MED ORDER — MAGNESIUM SULFATE 2 GM/50ML IV SOLN
2.0000 g | Freq: Once | INTRAVENOUS | Status: AC
  Administered 2019-06-17: 2 g via INTRAVENOUS
  Filled 2019-06-17: qty 50

## 2019-06-17 MED ORDER — POTASSIUM CHLORIDE CRYS ER 20 MEQ PO TBCR
40.0000 meq | EXTENDED_RELEASE_TABLET | Freq: Once | ORAL | Status: AC
  Administered 2019-06-17: 40 meq via ORAL
  Filled 2019-06-17: qty 2

## 2019-06-17 MED ORDER — TRAMADOL HCL 50 MG PO TABS
50.0000 mg | ORAL_TABLET | Freq: Once | ORAL | Status: DC
  Filled 2019-06-17 (×2): qty 1

## 2019-06-17 NOTE — Progress Notes (Addendum)
Occupational Therapy Treatment Patient Details Name: Anastazja Stinnett MRN: [id number] DOB: 1945-07-14 Today's Date: 06/17/2019    History of present illness Echo Chaparro is a 74 y.o. female with medical history significant for CAD s/p CABG, atrial tachycardia, history of aortic dissection, history of breast cancer, history of endometrial cancer, history of thyroid cancer with postoperative hypothyroidism, hypertension, hyperlipidemia, and depression/anxiety who presents to the ED for evaluation of fevers, chills, cough, he was diagnosed with acute hypoxic respiratory failure due to COVID-19 pneumonia and admitted to the hospital.   OT comments  Pt received in bed, reports rough night with panic attack, but agreeable to participate in therapy. Pt Modified Independent to sit EOB, minimal reports of dizziness and no nausea/headache today. Assessed BP with no signs of orthostatic hypotension noted today (see below). Pt Supervision for sit to stand trials without AD and min guard for stand pivot transfers without AD to ensure safety and balance. Pt transferred to [facility], Supervision for posterior hygiene sitting on BSC implementing lateral leans. After transfer to [facility], O2 levels dropped to 77% on 4 L O2 (good pleth wave noted, but pt denies SOB). Instructed in pursed lip breathing and rest breaks with O2 stats sustained 83-85% on 4 L O2. Assisted pt in transfer to recliner chair without AD and min guard assist. Continued with O2 desats. Bumped up to 5 L O2, trialed finger probe with lower readings (84%), retrial of ear probe on 5 L O2 at 89-92% at rest in recliner. Assessed response on 4 L O2 again at rest with ear probe at 87-89%. Left on 5 L O2 and followed up with RN. Continue to recommend [place] with 24 supervision/assist at this time. Will continue to follow acutely and monitor ADL functional abilities in light of increasing O2 demands.    Follow Up Recommendations  Home health OT;Supervision/Assistance - 24  hour    Equipment Recommendations  Other (comment)    Recommendations for Other Services      Precautions / Restrictions Precautions Precautions: Fall;Other (comment) Precaution Comments: monitor BP and oxygen Restrictions Weight Bearing Restrictions: No       Mobility Bed Mobility Overal bed mobility: Modified Independent             General bed mobility comments: Modified Independent for bed mobility with use of bedrail   Transfers Overall transfer level: Needs assistance Equipment used: None Transfers: Sit to/from Stand;Stand Pivot Transfers Sit to Stand: Supervision Stand pivot transfers: Min guard       General transfer comment: After BSC transfer, pt transferred to EOB and reported desire to sit in recliner after consideration. Pt min guard for pivot to recliner without AD     Balance Overall balance assessment: Needs assistance Sitting-balance support: Feet supported Sitting balance-Leahy Scale: Good     Standing balance support: No upper extremity supported Standing balance-Leahy Scale: Fair Standing balance comment: Required minA for attempts at marching in place, minimal step height bilaterally.                           ADL either performed or assessed with clinical judgement   ADL Overall ADL's : Needs assistance/impaired                         Toilet Transfer: Min Statistician Details (indicate cue type and reason): pt min guard to ensure safety in transfer to/from [facility] without AD, no LOB noted  Toileting-  Clothing Manipulation and Hygiene: Set up;Sitting/lateral lean Toileting - Clothing Manipulation Details (indicate cue type and reason): Pt setup for toileting hygiene with lateral leans on BSC after BM       General ADL Comments: Pt with decreased reports of dizziness/nausea today      Vision       Perception     Praxis      Cognition Arousal/Alertness: Awake/alert Behavior During  Therapy: WFL for tasks assessed/performed Overall Cognitive Status: Within Functional Limits for tasks assessed                                          Exercises Exercises: Other exercises General Exercises - Lower Extremity Ankle Circles/Pumps: AROM;Both;10 reps;Seated Long Arc Quad: AROM;10 reps;Both;Seated Hip Flexion/Marching: AROM;Both;10 reps;Seated Other Exercises Other Exercises: pursed lip breathing  Other Exercises: incentive spirometer x 10, max 750   Shoulder Instructions       General Comments Assessed BP with minimal reports of dizziness reported today. At rest BP at 110/69, 120/78 sitting EOB, 112/67 after transfer. Pt received on 4 L O2 (3 L O2 yesterday). After BSC transfer, desat to 77% on 4 L O2, sustained 83-85% with activity. With cues for pursed lip breathing and rest breaks, O2 did not improve. Bumped up to 5 L O2, trialed finger probe with lower readings (84%), retrial of ear probe on 5 L O2 at 89-92%. Assessed response on 4 L O2 again with ear probe at 87-89%. Left on 5 L O2 and followed up with RN.     Pertinent Vitals/ Pain       Pain Assessment: No/denies pain  Home Living                                          Prior Functioning/Environment              Frequency  Min 3X/week        Progress Toward Goals  OT Goals(current goals can now be found in the care plan section)  Progress towards OT goals: Progressing toward goals  Acute Rehab OT Goals Patient Stated Goal: feel well enough to go home  OT Goal Formulation: With patient Time For Goal Achievement: 06/30/19 Potential to Achieve Goals: Good ADL Goals Pt Will Perform Grooming: with modified independence;standing Pt Will Perform Lower Body Bathing: with modified independence;sitting/lateral leans;sit to/from stand Pt Will Perform Lower Body Dressing: with modified independence;sit to/from stand;sitting/lateral leans Pt Will Transfer to Toilet: with  supervision;ambulating;regular height toilet Pt Will Perform Toileting - Clothing Manipulation and hygiene: with modified independence;sit to/from stand Additional ADL Goal #1: Pt will verbalize at least 3 energy conservation strategies in order to maximize ADL independence.  Plan Discharge plan remains appropriate    Co-evaluation          OT goals addressed during session: ADL's and self-care      AM-PAC OT "6 Clicks" Daily Activity     Outcome Measure   Help from another person eating meals?: A Little Help from another person taking care of personal grooming?: A Little Help from another person toileting, which includes using toliet, bedpan, or urinal?: A Little Help from another person bathing (including washing, rinsing, drying)?: A Little Help from another person to put on and taking off regular upper  body clothing?: A Little Help from another person to put on and taking off regular lower body clothing?: A Little 6 Click Score: 18    End of Session Equipment Utilized During Treatment: Gait belt;Oxygen  OT Visit Diagnosis: Unsteadiness on feet (R26.81);Muscle weakness (generalized) (M62.81)   Activity Tolerance Patient tolerated treatment well;Other (comment)(limited by desats )   Patient Left in chair;with call bell/phone within reach   Nurse Communication Mobility status;Other (comment)(O2 stats )        Time: 1230-1306 OT Time Calculation (min): 36 min  Charges: OT General Charges $OT Visit: 1 Visit OT Treatments $Self Care/Home Management : 8-22 mins $Therapeutic Activity: 8-22 mins  Layla Maw, OTR/L   Layla Maw 06/17/2019, 3:35 PM

## 2019-06-17 NOTE — Progress Notes (Signed)
Physical Therapy Treatment Patient Details Name: Breanna Hernandez MRN: [id number] DOB: 05-23-1945 Today's Date: 06/17/2019    History of Present Illness Nayma Fava is a 74 y.o. female with medical history significant for CAD s/p CABG, atrial tachycardia, history of aortic dissection, history of breast cancer, history of endometrial cancer, history of thyroid cancer with postoperative hypothyroidism, hypertension, hyperlipidemia, and depression/anxiety who presents to the ED for evaluation of fevers, chills, cough, he was diagnosed with acute hypoxic respiratory failure due to COVID-19 pneumonia and admitted to the hospital.    PT Comments    Patient on 6L oxygen today compared to 3L yesterday. She denies dizziness but is unsteady in standing. Unable to get oxygen saturation when standing (once sitting approx 20 seconds, SpO2 87% on 6L). HR in mid 70s bpm during session. Focus of session on seated therex. Education provided on purpose, benefits, and improved technique of use of incentive spirometer and purpose of flutter. Patient also brushed her teeth seated, oxygen saturation down to 80% during this task. Pending medical status and mobility progression while in the hospital, anticipating patient will progress in order to discharge home with home PT. She may need to trial RW next session for dynamic standing balance.    Follow Up Recommendations  Home health PT(pending medical course and mobility progression )     Equipment Recommendations  (TBD)       Precautions / Restrictions Precautions Precautions: Fall;Other (comment) Precaution Comments: monitor BP and oxygen Restrictions Weight Bearing Restrictions: No    Mobility  Bed Mobility Overal bed mobility: Modified Independent             General bed mobility comments: Modified Independent for bed mobility with use of bedrail   Transfers Overall transfer level: Needs assistance Equipment used: None Transfers: Sit to/from  [employer] Transfers Sit to Stand: Supervision Stand pivot transfers: Min guard       General transfer comment: After BSC transfer, pt transferred to EOB and reported desire to sit in recliner after consideration. Pt min guard for pivot to recliner without AD   Ambulation/Gait     General Gait Details: Per discussion with OT earlier, patient desatted with BSC transfer earlier. This session, patient only minimally able to weight shift to attempt to march in place          Balance Overall balance assessment: Needs assistance Sitting-balance support: Feet supported Sitting balance-Leahy Scale: Good     Standing balance support: No upper extremity supported Standing balance-Leahy Scale: Fair Standing balance comment: Required minA for attempts at marching in place, minimal step height bilaterally.      Cognition Arousal/Alertness: Awake/alert Behavior During Therapy: WFL for tasks assessed/performed Overall Cognitive Status: Within Functional Limits for tasks assessed      Exercises General Exercises - Lower Extremity Ankle Circles/Pumps: AROM;Both;10 reps;Seated Long Arc Quad: AROM;10 reps;Both;Seated Hip Flexion/Marching: AROM;Both;10 reps;Seated Other Exercises Other Exercises: pursed lip breathing  Other Exercises: incentive spirometer x 10, max 750    General Comments General comments (skin integrity, edema, etc.): Assessed BP with minimal reports of dizziness reported today. At rest BP at 110/69, 120/78 sitting EOB, 112/67 after transfer. Pt received on 4 L O2 (3 L O2 yesterday). After BSC transfer, desat to 77% on 4 L O2, sustained 83-85% with activity. With cues for pursed lip breathing and rest breaks, O2 did not improve. Bumped up to 5 L O2, trialed finger probe with lower readings (84%), retrial of ear probe on 5 L O2 at 89-92%. Assessed  response on 4 L O2 again with ear probe at 87-89%. Left on 5 L O2 and followed up with RN.       Pertinent Vitals/Pain  Pain Assessment: No/denies pain           PT Goals (current goals can now be found in the care plan section) Acute Rehab PT Goals Patient Stated Goal: feel well enough to go home  Progress towards PT goals: Progressing toward goals    Frequency    Min 3X/week      PT Plan Current plan remains appropriate    Co-evaluation       OT goals addressed during session: ADL's and self-care      AM-PAC PT "6 Clicks" Mobility   Outcome Measure  Help needed turning from your back to your side while in a flat bed without using bedrails?: A Little Help needed moving from lying on your back to sitting on the side of a flat bed without using bedrails?: A Little Help needed moving to and from a bed to a chair (including a wheelchair)?: A Little Help needed standing up from a chair using your arms (e.g., wheelchair or bedside chair)?: A Little Help needed to walk in hospital room?: A Lot Help needed climbing 3-5 steps with a railing? : A Lot 6 Click Score: 16    End of Session Equipment Utilized During Treatment: Oxygen Activity Tolerance: Patient limited by fatigue;Treatment limited secondary to medical complications (Comment)(desats with activity despite 6L oxygen) Patient left: in chair;with call bell/phone within reach(verbalized understanding to ring for assist for mobility)   PT Visit Diagnosis: Unsteadiness on feet (R26.81);Other abnormalities of gait and mobility (R26.89)     Time: [id number] PT Time Calculation (min) (ACUTE ONLY): 34 min  Charges:  $Therapeutic Exercise: 23-37 mins                     Birdie Hopes, DPT, PT Acute Rehab [phone] office     Birdie Hopes 06/17/2019, 3:37 PM

## 2019-06-17 NOTE — Progress Notes (Signed)
PROGRESS NOTE                                                                                                                                                                                                             Patient Demographics:    Breanna Hernandez, is a 74 y.o. female, DOB - [birth year], [id number]  Outpatient Primary MD for the patient is Ann Held, DO    LOS - 3  Admit date - 06/12/2019    Chief Complaint  Patient presents with  . Weakness  . Palpitations       Brief Narrative  Breanna Hernandez is a 74 y.o. female with medical history significant for CAD s/p CABG, atrial tachycardia, history of aortic dissection, history of breast cancer, history of endometrial cancer, history of thyroid cancer with postoperative hypothyroidism, hypertension, hyperlipidemia, and depression/anxiety who presents to the ED for evaluation of fevers, chills, cough, he was diagnosed with acute hypoxic respiratory failure due to COVID-19 pneumonia and admitted to the hospital.   Subjective:   Patient in bed, appears comfortable, does report some headache today, cough, and mild dyspnea, reports feeling weak overall .   Assessment  & Plan :     Acute Hypoxic Resp. Failure due to Acute Covid 19 Viral Pneumonitis during the ongoing 2020 Covid 19 Pandemic - she has moderate to severe disease,currently on 4 L nasal cannula oxygen. -Continue with IV steroids -Continue with IV remdesivir -She remains on 4 L nasal cannula this morning, will be closely monitored, if hypoxia worsens Actemra will be provided.  Patient has consented for the same.  Encouraged the patient to sit up in chair in the daytime use I-S and flutter valve for pulmonary toiletry and then prone in bed when at night.  Actemra off label use - patient was told that if COVID-19 pneumonitis gets worse we might potentially use Actemra off label, patient denies any  known history of tuberculosis or hepatitis, understands the risks and benefits and wants to proceed with Actemra treatment if required.   -Continue to trend inflammatory markers closely  SpO2: 90 % O2 Flow Rate (L/min): 5 L/min  Recent Labs  Lab 06/27/2019 2138 06/15/19 0320 06/16/19 0405 06/17/19 0335  CRP 7.1* 8.1* 5.8* 4.9*  DDIMER 2.41* 2.50* 1.85* 2.29*  FERRITIN 536* 529*  --   --  BNP  --  52.3 91.0 88.3  PROCALCITON 0.11 0.13 0.12 <0.10    Hepatic Function Latest Ref Rng & Units 06/17/2019 06/16/2019 06/15/2019  Total Protein 6.5 - 8.1 g/dL 6.4(L) 5.8(L) 7.1  Albumin 3.5 - 5.0 g/dL 2.9(L) 2.8(L) 3.2(L)  AST 15 - 41 U/L 53(H) 44(H) 51(H)  ALT 0 - 44 U/L _0 Alk Phosphatase 38 - 126 U/L 82 80 94  Total Bilirubin 0.3 - 1.2 mg/dL 1.5(H) 1.2 1.8(H)  Bilirubin, Direct 0.0 - 0.3 mg/dL - - -    Mild COVID-19 viral infection related transaminitis.  -  Asymptomatic, trend.  History of atrial tachycardia.   -On Lopressor 50 daily which will be continued.  Dyslipidemia.   -On statin  CAD  - CAGB - no chest pain, continue on Lopressor, rosuvastatin, holding ASA due to low platelets.  Hypothyroidism.   -On Synthroid continue.  Anxiety and depression.   -Continue combination of sertraline and as needed Ativan.  Dehydration, generalized weakness, AKI and hypotension.  Blood pressure medications are adjusted, ARB discontinued, skip her evening beta-blocker, hydrate with IV fluids and monitor.  PT-OT eval.  Thrombocytopenia.  Question if this is due to viral illness, hold aspirin as she is on low-dose Eliquis.  Monitor closely.   Lab Results  Component Value Date   PLT 121 (L) 06/17/2019      Condition - Extremely Guarded  Family Communication  :  Husband 3/8, 3/9  Code Status :  Full  Diet :   Diet Order            Diet Heart Room service appropriate? Yes; Fluid consistency: Thin  Diet effective now               Disposition Plan  : Stay in the  hospital getting treatment for severe hypoxic respiratory failure due to COVID-19 pneumonia.she remains on $ L oxygen.  Consults  : None  Procedures  : None  PUD Prophylaxis : PPI  DVT Prophylaxis  : Eliquis   Lab Results  Component Value Date   PLT 121 (L) 06/17/2019    Inpatient Medications  Scheduled Meds: . apixaban  2.5 mg Oral BID  . vitamin C  500 mg Oral Daily  . dexamethasone (DECADRON) injection  6 mg Intravenous Q24H  . docusate sodium  200 mg Oral BID  . Ipratropium-Albuterol  1 puff Inhalation Q6H  . levothyroxine  175 mcg Oral Q0600  . mouth rinse  15 mL Mouth Rinse BID  . metoprolol tartrate  25 mg Oral BID  . pantoprazole  40 mg Oral Daily  . rosuvastatin  10 mg Oral QHS  . sertraline  50 mg Oral QHS  . zinc sulfate  220 mg Oral Daily   Continuous Infusions: . remdesivir 100 mg in NS 100 mL 100 mg (06/17/19 1004)   PRN Meds:.acetaminophen, albuterol, chlorpheniramine-HYDROcodone, guaiFENesin-dextromethorphan, LORazepam, ondansetron **OR** ondansetron (ZOFRAN) IV, phenol  Antibiotics  :    Anti-infectives (From admission, onward)   Start     Dose/Rate Route Frequency Ordered Stop   06/16/19 1000  remdesivir 100 mg in sodium chloride 0.9 % 100 mL IVPB     100 mg 200 mL/hr over 30 Minutes Intravenous Daily 06/15/19 0027 06/20/19 0959   06/15/19 1000  remdesivir 100 mg in sodium chloride 0.9 % 100 mL IVPB  Status:  Discontinued     100 mg 200 mL/hr over 30 Minutes Intravenous Daily 06/15/19 0023 06/15/19 0025   06/15/19 0030  remdesivir 200 mg in sodium chloride 0.9% 250 mL IVPB  Status:  Discontinued     200 mg 580 mL/hr over 30 Minutes Intravenous Once 06/15/19 0023 06/15/19 0025   06/15/19 0030  remdesivir 100 mg in sodium chloride 0.9 % 100 mL IVPB     100 mg 200 mL/hr over 30 Minutes Intravenous Every 30 min 06/15/19 0026 06/15/19 0218       Time Spent in minutes  72   Phillips Climes M.D on 06/17/2019 at 3:19 PM  To page go to  [email or web address] - password [facility]  [facility] -  Office  [phone]  See all Orders from today for further details    Objective:   Vitals:   06/16/19 1953 06/17/19 0600 06/17/19 0900 06/17/19 1337  BP: 130/63 103/60 108/64 114/72  Pulse: 99 (!) 110 97 73  Resp: (!) 27 (!) 37 19 (!) 22  Temp: 98.7 F (37.1 C) 98.1 F (36.7 C) 97.8 F (36.6 C) 97.6 F (36.4 C)  TempSrc: Oral Axillary Oral Oral  SpO2: 92% 90% 91% 90%  Weight:      Height:        Wt Readings from Last 3 Encounters:  06/25/2019 111.1 kg  02/19/19 108.6 kg  12/24/18 107.5 kg     Intake/Output Summary (Last 24 hours) at 06/17/2019 1519 Last data filed at 06/16/2019 1959 Gross per 24 hour  Intake 496.98 ml  Output 0 ml  Net 496.98 ml     Physical Exam  Awake Alert, Oriented X 3, No new F.N deficits, Normal affect Symmetrical Chest wall movement, Good air movement bilaterally, CTAB RRR,No Gallops,Rubs or new Murmurs, No Parasternal Heave +ve B.Sounds, Abd Soft, No tenderness, No rebound - guarding or rigidity. No Cyanosis, Clubbing or edema, No new Rash or bruise       Data Review:    CBC Recent Labs  Lab 06/24/2019 2138 06/15/19 0320 06/16/19 0405 06/16/19 1428 06/17/19 0335  WBC 5.4 5.0 9.2 10.3 7.9  HGB 14.3 12.9 12.5 13.6 12.2  HCT 41.7 37.4 36.0 38.3 35.4*  PLT 89* 59* PLATELET CLUMPS NOTED ON SMEAR, UNABLE TO ESTIMATE 103* 121*  MCV 98.6 98.2 96.8 96.5 96.5  MCH 33.8 33.9 33.6 34.3* 33.2  MCHC 34.3 34.5 34.7 35.5 34.5  RDW 12.1 12.0 11.9 11.9 11.9  LYMPHSABS 0.7 0.5* 0.7  --  0.5*  MONOABS 1.5* 0.9 2.3*  --  1.5*  EOSABS 0.0 0.0 0.0  --  0.0  BASOSABS 0.0 0.0 0.0  --  0.0    Chemistries  Recent Labs  Lab 06/12/2019 2138 06/15/19 0320 06/16/19 0405 06/17/19 0335  NA 133* 135 134* 136  K 3.4* 3.9 3.6 3.4*  CL 94* 96* 95* 100  CO2 _0 GLUCOSE 117* 125* 132* 130*  BUN 16 15 25* 20  CREATININE 0.94 0.97 1.05* 0.95  CALCIUM 8.7* 8.6* 8.4* 8.2*  MG  --  1.7 1.8  1.6*  AST 52* 51* 44* 53*  ALT _1 ALKPHOS 101 94 80 82  BILITOT 1.8* 1.8* 1.2 1.5*   ------------------------------------------------------------------------------------------------------------------ Recent Labs    07/05/2019 2138  TRIG 130    Lab Results  Component Value Date   HGBA1C 5.8 08/17/2014   ------------------------------------------------------------------------------------------------------------------ No results for input(s): TSH, T4TOTAL, T3FREE, THYROIDAB in the last 72 hours.  Invalid input(s): FREET3  Cardiac Enzymes No results for input(s): CKMB, TROPONINI, MYOGLOBIN in the last 168 hours.  Invalid input(s): CK ------------------------------------------------------------------------------------------------------------------  Component Value Date/Time   BNP 88.3 06/17/2019 0335    Micro Results Recent Results (from the past 240 hour(s))  Blood Culture (routine x 2)     Status: None (Preliminary result)   Collection Time: 06/10/2019  9:40 PM   Specimen: BLOOD  Result Value Ref Range Status   Specimen Description BLOOD RIGHT ANTECUBITAL  Final   Special Requests   Final    BOTTLES DRAWN AEROBIC AND ANAEROBIC Blood Culture adequate volume   Culture   Final    NO GROWTH 3 DAYS Performed at [facility], [address], [place], [place] [address]    Report Status PENDING  Incomplete  Blood Culture (routine x 2)     Status: None (Preliminary result)   Collection Time: 06/28/2019  9:53 PM   Specimen: BLOOD  Result Value Ref Range Status   Specimen Description BLOOD LEFT ANTECUBITAL  Final   Special Requests   Final    BOTTLES DRAWN AEROBIC AND ANAEROBIC Blood Culture adequate volume   Culture   Final    NO GROWTH 3 DAYS Performed at [facility], [place] [address], [place], [place] [address]    Report Status PENDING  Incomplete    Radiology Reports DG Chest Port 1 View  Result Date: 06/30/2019 CLINICAL DATA:  74 year old female  with shortness of breath, hypoxia. EXAM: PORTABLE CHEST 1 VIEW COMPARISON:  Chest CTA 09/24/2017 and earlier. FINDINGS: Portable AP view at 2119 hours. Mildly lower lung volumes. Sequelae of CABG. Stable cardiac size and mediastinal contours. Stable thyroidectomy surgical clips. Increased crowding of lung markings at both bases. Allowing for portable technique the lungs are otherwise clear. No pneumothorax. No acute osseous abnormality identified. IMPRESSION: Lower lung volumes with mild bibasilar atelectasis. Electronically Signed   By: Genevie Ann M.D.   On: 06/13/2019 21:32

## 2019-06-18 ENCOUNTER — Inpatient Hospital Stay (HOSPITAL_COMMUNITY): Payer: Medicare Other

## 2019-06-18 DIAGNOSIS — I2510 Atherosclerotic heart disease of native coronary artery without angina pectoris: Secondary | ICD-10-CM

## 2019-06-18 DIAGNOSIS — I10 Essential (primary) hypertension: Secondary | ICD-10-CM

## 2019-06-18 LAB — COMPREHENSIVE METABOLIC PANEL
ALT: 25 U/L (ref 0–44)
AST: 41 U/L (ref 15–41)
Albumin: 2.7 g/dL — ABNORMAL LOW (ref 3.5–5.0)
Alkaline Phosphatase: 79 U/L (ref 38–126)
Anion gap: 12 (ref 5–15)
BUN: 20 mg/dL (ref 8–23)
CO2: 24 mmol/L (ref 22–32)
Calcium: 8 mg/dL — ABNORMAL LOW (ref 8.9–10.3)
Chloride: 101 mmol/L (ref 98–111)
Creatinine, Ser: 0.82 mg/dL (ref 0.44–1.00)
GFR calc Af Amer: >60 mL/min (ref >60)
GFR calc non Af Amer: >60 mL/min (ref >60)
Glucose, Bld: 129 mg/dL — ABNORMAL HIGH (ref 70–99)
Potassium: 3.8 mmol/L (ref 3.5–5.1)
Sodium: 137 mmol/L (ref 135–145)
Total Bilirubin: 1.1 mg/dL (ref 0.3–1.2)
Total Protein: 6.1 g/dL — ABNORMAL LOW (ref 6.5–8.1)

## 2019-06-18 LAB — MAGNESIUM: Magnesium: 2.2 mg/dL (ref 1.7–2.4)

## 2019-06-18 LAB — CBC WITH DIFFERENTIAL/PLATELET
Abs Immature Granulocytes: 0.28 10*3/uL — ABNORMAL HIGH (ref 0.00–0.07)
Basophils Absolute: 0 10*3/uL (ref 0.0–0.1)
Basophils Relative: 0 %
Eosinophils Absolute: 0 10*3/uL (ref 0.0–0.5)
Eosinophils Relative: 0 %
HCT: 35.8 % — ABNORMAL LOW (ref 36.0–46.0)
Hemoglobin: 12.1 g/dL (ref 12.0–15.0)
Immature Granulocytes: 3 %
Lymphocytes Relative: 8 %
Lymphs Abs: 0.7 10*3/uL (ref 0.7–4.0)
MCH: 33.3 pg (ref 26.0–34.0)
MCHC: 33.8 g/dL (ref 30.0–36.0)
MCV: 98.6 fL (ref 80.0–100.0)
Monocytes Absolute: 2 10*3/uL — ABNORMAL HIGH (ref 0.1–1.0)
Monocytes Relative: 23 %
Neutro Abs: 5.9 10*3/uL (ref 1.7–7.7)
Neutrophils Relative %: 66 %
Platelets: UNDETERMINED 10*3/uL (ref 150–400)
RBC: 3.63 MIL/uL — ABNORMAL LOW (ref 3.87–5.11)
RDW: 12 % (ref 11.5–15.5)
WBC: 8.9 10*3/uL (ref 4.0–10.5)
nRBC: 0 % (ref 0.0–0.2)

## 2019-06-18 LAB — PROCALCITONIN: Procalcitonin: 0.12 ng/mL

## 2019-06-18 LAB — D-DIMER, QUANTITATIVE: D-Dimer, Quant: 1.8 ug/mL-FEU — ABNORMAL HIGH (ref 0.00–0.50)

## 2019-06-18 LAB — C-REACTIVE PROTEIN: CRP: 5.9 mg/dL — ABNORMAL HIGH (ref <1.0)

## 2019-06-18 LAB — BRAIN NATRIURETIC PEPTIDE: B Natriuretic Peptide: 935 pg/mL — ABNORMAL HIGH (ref 0.0–100.0)

## 2019-06-18 MED ORDER — FUROSEMIDE 10 MG/ML IJ SOLN
60.0000 mg | Freq: Two times a day (BID) | INTRAMUSCULAR | Status: AC
  Administered 2019-06-18 – 2019-06-19 (×3): 60 mg via INTRAVENOUS
  Filled 2019-06-18 (×2): qty 6

## 2019-06-18 MED ORDER — ENOXAPARIN SODIUM 120 MG/0.8ML ~~LOC~~ SOLN
1.0000 mg/kg | Freq: Two times a day (BID) | SUBCUTANEOUS | Status: DC
  Filled 2019-06-18: qty 0.73

## 2019-06-18 MED ORDER — DEXAMETHASONE SODIUM PHOSPHATE 10 MG/ML IJ SOLN
10.0000 mg | INTRAMUSCULAR | Status: DC
  Administered 2019-06-18 – 2019-06-20 (×3): 10 mg via INTRAVENOUS
  Filled 2019-06-18 (×3): qty 1

## 2019-06-18 MED ORDER — FUROSEMIDE 10 MG/ML IJ SOLN
60.0000 mg | Freq: Once | INTRAMUSCULAR | Status: AC
  Administered 2019-06-18: 60 mg via INTRAVENOUS
  Filled 2019-06-18: qty 6

## 2019-06-18 MED ORDER — FUROSEMIDE 10 MG/ML IJ SOLN: 40.0000 mg | Freq: Two times a day (BID) | INTRAMUSCULAR | Status: DC

## 2019-06-18 MED ORDER — FUROSEMIDE 10 MG/ML IJ SOLN
INTRAMUSCULAR | Status: AC
  Administered 2019-06-18: 60 mg via INTRAVENOUS
  Filled 2019-06-18: qty 6

## 2019-06-18 MED ORDER — LORAZEPAM 0.5 MG PO TABS
0.5000 mg | ORAL_TABLET | Freq: Four times a day (QID) | ORAL | Status: DC | PRN
  Administered 2019-06-18 – 2019-06-25 (×14): 0.5 mg via ORAL
  Filled 2019-06-18 (×14): qty 1

## 2019-06-18 MED ORDER — TOCILIZUMAB 400 MG/20ML IV SOLN
800.0000 mg | Freq: Once | INTRAVENOUS | Status: AC
  Administered 2019-06-18: 800 mg via INTRAVENOUS
  Filled 2019-06-18: qty 40

## 2019-06-18 MED ORDER — ENOXAPARIN SODIUM 120 MG/0.8ML ~~LOC~~ SOLN
110.0000 mg | Freq: Two times a day (BID) | SUBCUTANEOUS | Status: DC
  Administered 2019-06-18 – 2019-06-26 (×16): 110 mg via SUBCUTANEOUS
  Filled 2019-06-18 (×17): qty 0.73

## 2019-06-18 NOTE — Progress Notes (Addendum)
PROGRESS NOTE                                                                                                                                                                                                             Patient Demographics:    Breanna Hernandez, is a 74 y.o. female, DOB - [birth year], YME:[id number]  Outpatient Primary MD for the patient is Ann Held, DO    LOS - 4  Admit date - 06/21/2019    Chief Complaint  Patient presents with  . Weakness  . Palpitations       Brief Narrative  Breanna Hernandez is a 74 y.o. female with medical history significant for CAD s/p CABG, atrial tachycardia, history of aortic dissection, history of breast cancer, history of endometrial cancer, history of thyroid cancer with postoperative hypothyroidism, hypertension, hyperlipidemia, and depression/anxiety who presents to the ED for evaluation of fevers, chills, cough, he was diagnosed with acute hypoxic respiratory failure due to COVID-19 pneumonia and admitted to the hospital.   Subjective:   Patient in bed, appears comfortable, denies any headache today, reports some dyspnea this morning, still reports cough .   Assessment  & Plan :     Acute Hypoxic Resp. Failure due to Acute Covid 19 Viral Pneumonitis during the ongoing 2020 Covid 19 Pandemic - she has moderate to severe disease, this morning she was up to 9 L nasal cannula, this has improved after giving Lasix, she is back to 4 L nasal cannula. -Continue with IV steroids, have increased her dose to 10 mg IV once daily. -Continue with IV remdesivir - will be closely monitored, if hypoxia worsens Actemra will be provided.  Patient has consented for the same. -Patient with increased oxygen requirement this morning at 9 L, this has improved with IV Lasix, she had significant increase in her BNP 88> 935, worsening hypoxia most likely in the setting of volume overload, will  continue with Lasix 40 mg IV twice daily, will monitor renal function closely and will obtain 2D echo.  Encouraged the patient to sit up in chair in the daytime use I-S and flutter valve for pulmonary toiletry and then prone in bed when at night.   Addendum 6:00pm -Patient remains with increased oxygen requirement, she is currently on 11 L high flow nasal cannula, evidence of volume overload, with  aggressive IV diuresis, her hypoxia related to COVID-19 pneumonia, especially with elevated inflammatory markers I have discussed at length with patient, and her husband about Actemra, understand risks and benefits, and agreeable to proceed with Actemra, patient with no history of TB, hepatitis B, or bowel perforation, no active malignancy(she reports thyroid cancer in remission for last 5 years), no recent acute diverticulitis(had one single episode more than 6 years ago with no recurrence), will go ahead and proceed with Actemra given her worsening respiratory status and increased oxygen requirement.  Actemra off label use - patient was told that if COVID-19 pneumonitis gets worse we might potentially use Actemra off label, patient denies any known history of tuberculosis or hepatitis, understands the risks and benefits and wants to proceed with Actemra treatment if required.   -Continue to trend inflammatory markers closely  SpO2: 94 % O2 Flow Rate (L/min): 4 L/min  Recent Labs  Lab 06/24/2019 2138 06/15/19 0320 06/16/19 0405 06/17/19 0335 06/18/19 0259  CRP 7.1* 8.1* 5.8* 4.9* 5.9*  DDIMER 2.41* 2.50* 1.85* 2.29* 1.80*  FERRITIN 536* 529*  --   --   --   BNP  --  52.3 91.0 88.3 935.0*  PROCALCITON 0.11 0.13 0.12 <0.10 0.12    Hepatic Function Latest Ref Rng & Units 06/18/2019 06/17/2019 06/16/2019  Total Protein 6.5 - 8.1 g/dL 6.1(L) 6.4(L) 5.8(L)  Albumin 3.5 - 5.0 g/dL 2.7(L) 2.9(L) 2.8(L)  AST 15 - 41 U/L 41 53(H) 44(H)  ALT 0 - 44 U/L 25 30 27   Alk Phosphatase 38 - 126 U/L 79 82 80    Total Bilirubin 0.3 - 1.2 mg/dL 1.1 1.5(H) 1.2  Bilirubin, Direct 0.0 - 0.3 mg/dL - - -    Mild COVID-19 viral infection related transaminitis.  -  Asymptomatic, trend.  History of atrial tachycardia/a flutter -On Lopressor 50 daily which will be continued. -He is on low-dose Eliquis 2.5 mg p.o. twice daily, will transition to full dose Lovenox.  Dyslipidemia.   -On statin  CAD  - CAGB - no chest pain, continue on Lopressor, rosuvastatin, holding ASA due to low platelets.  Hypothyroidism.   -On Synthroid continue.  Anxiety and depression.   -Continue combination of sertraline and as needed Ativan.  Dehydration, generalized weakness, AKI and hypotension.  Blood pressure medications are adjusted, ARB discontinued,  Thrombocytopenia.  Question if this is due to viral illness, hold aspirin as she is on low-dose Eliquis.  Monitor closely.  As well clumping likely contributing to low platelet count  Lab Results  Component Value Date   PLT PLATELET CLUMPS NOTED ON SMEAR, UNABLE TO ESTIMATE 06/18/2019      Condition - Extremely Guarded  Family Communication  : Discussed with husband 3/11  Code Status :  Full  Diet :   Diet Order            Diet Heart Room service appropriate? Yes; Fluid consistency: Thin  Diet effective now               Disposition Plan  : Home with home health once stable, she remains on significant oxygen requirement, remains on IV steroids, and IV diuresis .  Consults  : None  Procedures  : None  PUD Prophylaxis : PPI  DVT Prophylaxis  : Eliquis   Lab Results  Component Value Date   PLT PLATELET CLUMPS NOTED ON SMEAR, UNABLE TO ESTIMATE 06/18/2019    Inpatient Medications  Scheduled Meds: . apixaban  2.5 mg Oral BID  .  vitamin C  500 mg Oral Daily  . [START ON 06/19/2019] dexamethasone (DECADRON) injection  10 mg Intravenous Q24H  . docusate sodium  200 mg Oral BID  . furosemide  40 mg Intravenous BID  . Ipratropium-Albuterol  1  puff Inhalation Q6H  . levothyroxine  175 mcg Oral Q0600  . mouth rinse  15 mL Mouth Rinse BID  . metoprolol tartrate  25 mg Oral BID  . pantoprazole  40 mg Oral Daily  . rosuvastatin  10 mg Oral QHS  . sertraline  50 mg Oral QHS  . traMADol  50 mg Oral Once  . zinc sulfate  220 mg Oral Daily   Continuous Infusions: . remdesivir 100 mg in NS 100 mL 100 mg (06/18/19 0819)   PRN Meds:.acetaminophen, albuterol, chlorpheniramine-HYDROcodone, guaiFENesin-dextromethorphan, LORazepam, ondansetron **OR** ondansetron (ZOFRAN) IV, phenol  Antibiotics  :    Anti-infectives (From admission, onward)   Start     Dose/Rate Route Frequency Ordered Stop   06/16/19 1000  remdesivir 100 mg in sodium chloride 0.9 % 100 mL IVPB     100 mg 200 mL/hr over 30 Minutes Intravenous Daily 06/15/19 0027 06/20/19 0959   06/15/19 1000  remdesivir 100 mg in sodium chloride 0.9 % 100 mL IVPB  Status:  Discontinued     100 mg 200 mL/hr over 30 Minutes Intravenous Daily 06/15/19 0023 06/15/19 0025   06/15/19 0030  remdesivir 200 mg in sodium chloride 0.9% 250 mL IVPB  Status:  Discontinued     200 mg 580 mL/hr over 30 Minutes Intravenous Once 06/15/19 0023 06/15/19 0025   06/15/19 0030  remdesivir 100 mg in sodium chloride 0.9 % 100 mL IVPB     100 mg 200 mL/hr over 30 Minutes Intravenous Every 30 min 06/15/19 0026 06/15/19 0218       Time Spent in minutes  58   Phillips Climes M.D on 06/18/2019 at 2:05 PM  To page go to [email or web address] - password [facility]  [facility] -  Office  [phone]  See all Orders from today for further details    Objective:   Vitals:   06/17/19 1337 06/17/19 1639 06/17/19 2129 06/18/19 0900  BP: 114/72  140/75   Pulse: 73  70 75  Resp: (!) 22  19 (!) 27  Temp: 97.6 F (36.4 C)  98.1 F (36.7 C)   TempSrc: Oral     SpO2: 90% 90% 90% 94%  Weight:      Height:        Wt Readings from Last 3 Encounters:  07/07/2019 111.1 kg  02/19/19 108.6 kg  12/24/18 107.5 kg       Intake/Output Summary (Last 24 hours) at 06/18/2019 1405 Last data filed at 06/18/2019 0900 Gross per 24 hour  Intake 480 ml  Output --  Net 480 ml     Physical Exam  Awake Alert, Oriented X 3, No new F.N deficits, Normal affect Symmetrical Chest wall movement, Good air movement bilaterally, scattered rales at the bases RRR,No Gallops,Rubs or new Murmurs, No Parasternal Heave +ve B.Sounds, Abd Soft, No tenderness, No rebound - guarding or rigidity. No Cyanosis, Clubbing or edema, No new Rash or bruise        Data Review:    CBC Recent Labs  Lab 06/20/2019 2138 06/18/2019 2138 06/15/19 0320 06/16/19 0405 06/16/19 1428 06/17/19 0335 06/18/19 0259  WBC 5.4   < > 5.0 9.2 10.3 7.9 8.9  HGB 14.3   < > 12.9 12.5 13.6 12.2  12.1  HCT 41.7   < > 37.4 36.0 38.3 35.4* 35.8*  PLT 89*   < > 59* PLATELET CLUMPS NOTED ON SMEAR, UNABLE TO ESTIMATE 103* 121* PLATELET CLUMPS NOTED ON SMEAR, UNABLE TO ESTIMATE  MCV 98.6   < > 98.2 96.8 96.5 96.5 98.6  MCH 33.8   < > 33.9 33.6 34.3* 33.2 33.3  MCHC 34.3   < > 34.5 34.7 35.5 34.5 33.8  RDW 12.1   < > 12.0 11.9 11.9 11.9 12.0  LYMPHSABS 0.7  --  0.5* 0.7  --  0.5* 0.7  MONOABS 1.5*  --  0.9 2.3*  --  1.5* 2.0*  EOSABS 0.0  --  0.0 0.0  --  0.0 0.0  BASOSABS 0.0  --  0.0 0.0  --  0.0 0.0   < > = values in this interval not displayed.    Chemistries  Recent Labs  Lab 06/28/2019 2138 06/15/19 0320 06/16/19 0405 06/17/19 0335 06/18/19 0259  NA 133* 135 134* 136 137  K 3.4* 3.9 3.6 3.4* 3.8  CL 94* 96* 95* 100 101  CO2 22 26 26 24 24   GLUCOSE 117* 125* 132* 130* 129*  BUN 16 15 25* 20 20  CREATININE 0.94 0.97 1.05* 0.95 0.82  CALCIUM 8.7* 8.6* 8.4* 8.2* 8.0*  MG  --  1.7 1.8 1.6* 2.2  AST 52* 51* 44* 53* 41  ALT 30 31 27 30 25   ALKPHOS 101 94 80 82 79  BILITOT 1.8* 1.8* 1.2 1.5* 1.1   ------------------------------------------------------------------------------------------------------------------ No results for input(s):  CHOL, HDL, LDLCALC, TRIG, CHOLHDL, LDLDIRECT in the last 72 hours.  Lab Results  Component Value Date   HGBA1C 5.8 08/17/2014   ------------------------------------------------------------------------------------------------------------------ No results for input(s): TSH, T4TOTAL, T3FREE, THYROIDAB in the last 72 hours.  Invalid input(s): FREET3  Cardiac Enzymes No results for input(s): CKMB, TROPONINI, MYOGLOBIN in the last 168 hours.  Invalid input(s): CK ------------------------------------------------------------------------------------------------------------------    Component Value Date/Time   BNP 935.0 (H) 06/18/2019 0259    Micro Results Recent Results (from the past 240 hour(s))  Blood Culture (routine x 2)     Status: None (Preliminary result)   Collection Time: 07/08/2019  9:40 PM   Specimen: BLOOD  Result Value Ref Range Status   Specimen Description BLOOD RIGHT ANTECUBITAL  Final   Special Requests   Final    BOTTLES DRAWN AEROBIC AND ANAEROBIC Blood Culture adequate volume   Culture   Final    NO GROWTH 4 DAYS Performed at [facility], [address] [place], [place] [address]    Report Status PENDING  Incomplete  Blood Culture (routine x 2)     Status: None (Preliminary result)   Collection Time: 06/22/2019  9:53 PM   Specimen: BLOOD  Result Value Ref Range Status   Specimen Description BLOOD LEFT ANTECUBITAL  Final   Special Requests   Final    BOTTLES DRAWN AEROBIC AND ANAEROBIC Blood Culture adequate volume   Culture   Final    NO GROWTH 4 DAYS Performed at [facility], [place] [address], [place], [place] [address]    Report Status PENDING  Incomplete    Radiology Reports DG Chest Port 1 View  Result Date: 06/18/2019 CLINICAL DATA:  Hypoxia, COVID-19 positive EXAM: PORTABLE CHEST 1 VIEW COMPARISON:  07/04/2019 chest radiograph. FINDINGS: Intact sternotomy wires. Stable cardiomediastinal silhouette with mild cardiomegaly. No pneumothorax.  No pleural effusion. Lungs appear clear, with no acute consolidative airspace  disease and no pulmonary edema. IMPRESSION: Stable mild cardiomegaly without pulmonary edema. No active pulmonary disease. Electronically Signed   By: Ilona Sorrel M.D.   On: 06/18/2019 11:33   DG Chest Port 1 View  Result Date: 06/15/2019 CLINICAL DATA:  74 year old female with shortness of breath, hypoxia. EXAM: PORTABLE CHEST 1 VIEW COMPARISON:  Chest CTA 09/24/2017 and earlier. FINDINGS: Portable AP view at 2119 hours. Mildly lower lung volumes. Sequelae of CABG. Stable cardiac size and mediastinal contours. Stable thyroidectomy surgical clips. Increased crowding of lung markings at both bases. Allowing for portable technique the lungs are otherwise clear. No pneumothorax. No acute osseous abnormality identified. IMPRESSION: Lower lung volumes with mild bibasilar atelectasis. Electronically Signed   By: Genevie Ann M.D.   On: 07/04/2019 21:32

## 2019-06-18 NOTE — Progress Notes (Signed)
Occupational Therapy Treatment Patient Details Name: Breanna Hernandez MRN: [id number] DOB: 09/23/1945 Today's Date: 06/18/2019    History of present illness Breanna Hernandez is a 74 y.o. female with medical history significant for CAD s/p CABG, atrial tachycardia, history of aortic dissection, history of breast cancer, history of endometrial cancer, history of thyroid cancer with postoperative hypothyroidism, hypertension, hyperlipidemia, and depression/anxiety who presents to the ED for evaluation of fevers, chills, cough, he was diagnosed with acute hypoxic respiratory failure due to COVID-19 pneumonia and admitted to the hospital.   OT comments  Pt progressing with OT goals this session and appeared to be feeling better than previous session. Pt received in recliner chair on 6 L O2. Pt Independent for sit to stand without AD, Supervision for stand pivot recliner <> BSC without AD. Pt denies dizziness, BP WFL today. After transfer, desats to 76% at lowest noted, good pleth achieved at 82% during toileting task. Pt reported only mild SOB. After transfer back to recliner, pt required at least 2 minutes to recover and sustain at 88% on 6 L O2. Encouraged pursed lip breathing. Pt Setup for washing hair with shampoo cap and brushing hair. Pt with good inquiries regarding O2 stats, positioning, and overall ways to improve recovery. Educated pt on sitting up out of bed and to try proning in bed tonight as appropriate (collaborated with RN on this). Encouraged use of flutter valve and incentive spirometer. Will continue to follow acutely.    Follow Up Recommendations  Home health OT;Supervision/Assistance - 24 hour    Equipment Recommendations  Other (comment)    Recommendations for Other Services      Precautions / Restrictions Precautions Precautions: Fall;Other (comment)(airborne) Precaution Comments: monitor oxygen       Mobility Bed Mobility               General bed mobility comments:  pt up in chair   Transfers Overall transfer level: Needs assistance Equipment used: None Transfers: Sit to/from Stand;Stand Pivot Transfers Sit to Stand: Independent Stand pivot transfers: Supervision            Balance                                           ADL either performed or assessed with clinical judgement   ADL Overall ADL's : Needs assistance/impaired     Grooming: Set up;Brushing hair;Sitting Grooming Details (indicate cue type and reason): Pt setup for washing hair with shampoo cap and brushing hair seated in chair                 Toilet Transfer: Set up;Supervision/safety;Stand-pivot;BSC Toilet Transfer Details (indicate cue type and reason): Supervision, more steady with pivot/no AD. No reports of dizziness Toileting- Clothing Manipulation and Hygiene: Set up;Sitting/lateral lean Toileting - Clothing Manipulation Details (indicate cue type and reason): Setup for lateral lean for peri care       General ADL Comments: Pt with improved overall appearance this session, no dizziness. Pt on 6 L O2 and reports mild SOB with activity (however, desats to 76%-mid 80s)     Vision       Perception     Praxis      Cognition Arousal/Alertness: Awake/alert Behavior During Therapy: WFL for tasks assessed/performed Overall Cognitive Status: Within Functional Limits for tasks assessed  General Comments: Pt more alert this session        Exercises     Shoulder Instructions       General Comments Pt received on 6 L O2. At rest, pt at 88-90%. With activity, dropped to 76%, noted at 82% with good pleth. Pt reports only mild SOB. Sustained at 88% on 6 L O2 after seated rest break with at least 2 min to acheive     Pertinent Vitals/ Pain       Pain Assessment: No/denies pain  Home Living                                          Prior Functioning/Environment               Frequency  Min 3X/week        Progress Toward Goals  OT Goals(current goals can now be found in the care plan section)  Progress towards OT goals: Progressing toward goals  Acute Rehab OT Goals Patient Stated Goal: feel well enough to go home  OT Goal Formulation: With patient Time For Goal Achievement: 06/30/19 Potential to Achieve Goals: Good ADL Goals Pt Will Perform Grooming: with modified independence;standing Pt Will Perform Lower Body Bathing: with modified independence;sitting/lateral leans;sit to/from stand Pt Will Perform Lower Body Dressing: with modified independence;sit to/from stand;sitting/lateral leans Pt Will Transfer to Toilet: with supervision;ambulating;regular height toilet Pt Will Perform Toileting - Clothing Manipulation and hygiene: with modified independence;sit to/from stand Additional ADL Goal #1: Pt will verbalize at least 3 energy conservation strategies in order to maximize ADL independence.  Plan Discharge plan remains appropriate    Co-evaluation          OT goals addressed during session: ADL's and self-care      AM-PAC OT "6 Clicks" Daily Activity     Outcome Measure   Help from another person eating meals?: None Help from another person taking care of personal grooming?: A Little Help from another person toileting, which includes using toliet, bedpan, or urinal?: A Little Help from another person bathing (including washing, rinsing, drying)?: A Little Help from another person to put on and taking off regular upper body clothing?: A Little Help from another person to put on and taking off regular lower body clothing?: A Little 6 Click Score: 19    End of Session Equipment Utilized During Treatment: Oxygen  OT Visit Diagnosis: Unsteadiness on feet (R26.81);Muscle weakness (generalized) (M62.81)   Activity Tolerance Patient tolerated treatment well   Patient Left in chair;with call bell/phone within reach   Nurse  Communication Mobility status;Other (comment)(O2 responses, plans to attempt prone tonight )        Time: [id number] OT Time Calculation (min): 38 min  Charges: OT General Charges $OT Visit: 1 Visit OT Treatments $Self Care/Home Management : 23-37 mins $Therapeutic Activity: 8-22 mins  Layla Maw, OTR/L   Layla Maw 06/18/2019, 4:11 PM

## 2019-06-19 LAB — COMPREHENSIVE METABOLIC PANEL
ALT: 30 U/L (ref 0–44)
AST: 44 U/L — ABNORMAL HIGH (ref 15–41)
Albumin: 3 g/dL — ABNORMAL LOW (ref 3.5–5.0)
Alkaline Phosphatase: 89 U/L (ref 38–126)
Anion gap: 15 (ref 5–15)
BUN: 23 mg/dL (ref 8–23)
CO2: 27 mmol/L (ref 22–32)
Calcium: 8 mg/dL — ABNORMAL LOW (ref 8.9–10.3)
Chloride: 97 mmol/L — ABNORMAL LOW (ref 98–111)
Creatinine, Ser: 1.02 mg/dL — ABNORMAL HIGH (ref 0.44–1.00)
GFR calc Af Amer: >60 mL/min (ref >60)
GFR calc non Af Amer: 54 mL/min — ABNORMAL LOW (ref >60)
Glucose, Bld: 160 mg/dL — ABNORMAL HIGH (ref 70–99)
Potassium: 3.5 mmol/L (ref 3.5–5.1)
Sodium: 139 mmol/L (ref 135–145)
Total Bilirubin: 1.2 mg/dL (ref 0.3–1.2)
Total Protein: 6.8 g/dL (ref 6.5–8.1)

## 2019-06-19 LAB — CULTURE, BLOOD (ROUTINE X 2)
Culture: NO GROWTH
Culture: NO GROWTH
Special Requests: ADEQUATE
Special Requests: ADEQUATE

## 2019-06-19 LAB — CBC WITH DIFFERENTIAL/PLATELET
Abs Immature Granulocytes: 0.45 10*3/uL — ABNORMAL HIGH (ref 0.00–0.07)
Basophils Absolute: 0 10*3/uL (ref 0.0–0.1)
Basophils Relative: 0 %
Eosinophils Absolute: 0 10*3/uL (ref 0.0–0.5)
Eosinophils Relative: 0 %
HCT: 40 % (ref 36.0–46.0)
Hemoglobin: 13.9 g/dL (ref 12.0–15.0)
Immature Granulocytes: 4 %
Lymphocytes Relative: 5 %
Lymphs Abs: 0.6 10*3/uL — ABNORMAL LOW (ref 0.7–4.0)
MCH: 33.7 pg (ref 26.0–34.0)
MCHC: 34.8 g/dL (ref 30.0–36.0)
MCV: 97.1 fL (ref 80.0–100.0)
Monocytes Absolute: 1.2 10*3/uL — ABNORMAL HIGH (ref 0.1–1.0)
Monocytes Relative: 10 %
Neutro Abs: 9.1 10*3/uL — ABNORMAL HIGH (ref 1.7–7.7)
Neutrophils Relative %: 81 %
Platelets: 181 10*3/uL (ref 150–400)
RBC: 4.12 MIL/uL (ref 3.87–5.11)
RDW: 12 % (ref 11.5–15.5)
WBC: 11.3 10*3/uL — ABNORMAL HIGH (ref 4.0–10.5)
nRBC: 0 % (ref 0.0–0.2)

## 2019-06-19 LAB — PROCALCITONIN: Procalcitonin: 0.12 ng/mL

## 2019-06-19 LAB — D-DIMER, QUANTITATIVE: D-Dimer, Quant: 2.24 ug/mL-FEU — ABNORMAL HIGH (ref 0.00–0.50)

## 2019-06-19 LAB — BRAIN NATRIURETIC PEPTIDE: B Natriuretic Peptide: 100.5 pg/mL — ABNORMAL HIGH (ref 0.0–100.0)

## 2019-06-19 LAB — MAGNESIUM: Magnesium: 1.8 mg/dL (ref 1.7–2.4)

## 2019-06-19 LAB — C-REACTIVE PROTEIN: CRP: 3.9 mg/dL — ABNORMAL HIGH (ref <1.0)

## 2019-06-19 NOTE — Progress Notes (Signed)
Physical Therapy Treatment Patient Details Name: Breanna Hernandez MRN: [id number] DOB: 1945-04-25 Today's Date: 06/19/2019    History of Present Illness 74 year old female admitted 06/27/2019 with fevers, chills, cough and admitted with respiratory failure and gastroenteritis due to COVID requiring supplmental oxygen. PMH: CAD s/p CABG, atrial tachycardia, aortic dissection, breast cancer, emdometrial cancer, thyroid cancer     PT Comments    Patient on 15L HFNC + >15L NRB. Patient reporting dizziness with movement, unable to quantify if mild, moderate, or severe. BP 126/72. Transfer training to [facility]. HR 130 bpm, RR 20, SpO2 92%. Patient required assist for pericare. Pending medical course and ability to wean from oxygen as well as mobility tolerance and progression, patient may benefit from SNF for short term rehab at time of discharge unless she progresses her mobility enough to safely discharge home with home PT. Currently, patient demonstrates severely decreased activity tolerance. Assisted patient with positioning to increase comfort.   Follow Up Recommendations  Home health PT;Supervision/Assistance - 24 hour;SNF(pending medical course and mobility progress )     Equipment Recommendations  3in1 (PT)       Precautions / Restrictions Precautions Precautions: Fall;Other (comment) Precaution Comments: monitor oxygen saturation    Mobility  Bed Mobility               General bed mobility comments: Patient already OOB in chair  Transfers Overall transfer level: Needs assistance Equipment used: None Transfers: Stand Pivot Transfers   Stand pivot transfers: Min guard       General transfer comment: chair<>BSC transfer with patient using UE support on armrests of chair and BSC  Ambulation/Gait             General Gait Details: would be unable to tolerate at this time       Balance Overall balance assessment: Needs assistance         Standing balance support:  Single extremity supported Standing balance-Leahy Scale: Fair Standing balance comment: unilat UE support on armrests during transfer     Cognition Arousal/Alertness: Lethargic;Awake/alert    General Comments: Eyes closed when seated         General Comments General comments (skin integrity, edema, etc.): Nurse cleared patient to participate in PT session to patient's tolerance. Down to 87% using BSC.       Pertinent Vitals/Pain Pain Assessment: No/denies pain           PT Goals (current goals can now be found in the care plan section) Progress towards PT goals: Progressing toward goals    Frequency    Min 3X/week      PT Plan Discharge plan needs to be updated       AM-PAC PT "6 Clicks" Mobility   Outcome Measure  Help needed turning from your back to your side while in a flat bed without using bedrails?: A Little Help needed moving from lying on your back to sitting on the side of a flat bed without using bedrails?: A Little Help needed moving to and from a bed to a chair (including a wheelchair)?: A Little Help needed standing up from a chair using your arms (e.g., wheelchair or bedside chair)?: A Little Help needed to walk in hospital room?: A Little Help needed climbing 3-5 steps with a railing? : A Lot 6 Click Score: 17    End of Session Equipment Utilized During Treatment: Oxygen Activity Tolerance: Patient limited by lethargy;Patient limited by fatigue Patient left: in chair;with call bell/phone within reach  Nurse Communication: Mobility status PT Visit Diagnosis: Unsteadiness on feet (R26.81);Other abnormalities of gait and mobility (R26.89)     Time: [id number] PT Time Calculation (min) (ACUTE ONLY): 31 min  Charges:  $Therapeutic Activity: 23-37 mins                     Birdie Hopes, DPT, PT Acute Rehab [phone] office     Birdie Hopes 06/19/2019, 2:13 PM

## 2019-06-19 NOTE — Plan of Care (Signed)
  Problem: Activity: Goal: Will identify at least one activity in which they can participate Outcome: Progressing   Problem: Coping: Goal: Ability to identify and develop effective coping behavior will improve Outcome: Progressing Goal: Ability to interact with others will improve Outcome: Progressing Goal: Demonstration of participation in decision-making regarding own care will improve Outcome: Progressing Goal: Ability to use eye contact when communicating with others will improve Outcome: Progressing   Problem: Health Behavior/Discharge Planning: Goal: Identification of resources available to assist in meeting health care needs will improve Outcome: Progressing

## 2019-06-19 NOTE — Progress Notes (Signed)
PROGRESS NOTE                                                                                                                                                                                                             Patient Demographics:    Breanna Hernandez, is a 74 y.o. female, DOB - [birth year], TDS:[id number]  Outpatient Primary MD for the patient is Ann Held, DO    LOS - 5  Admit date - 06/28/2019    Chief Complaint  Patient presents with  . Weakness  . Palpitations       Brief Narrative  Breanna Hernandez is a 74 y.o. female with medical history significant for CAD s/p CABG, atrial tachycardia, history of aortic dissection, history of breast cancer, history of endometrial cancer, history of thyroid cancer with postoperative hypothyroidism, hypertension, hyperlipidemia, and depression/anxiety who presents to the ED for evaluation of fevers, chills, cough, he was diagnosed with acute hypoxic respiratory failure due to COVID-19 pneumonia and admitted to the hospital.   Subjective:   Patient with increased oxygen requirement overnight, reports dyspnea with minimal activity, still reports cough .    Assessment  & Plan :     Acute Hypoxic Resp. Failure due to Acute Covid 19 Viral Pneumonitis during the ongoing 2020 Covid 19 Pandemic - she has moderate to severe disease, she continues to have increased oxygen requirement, this morning she is on 15 L high flow nasal cannula. -Continue with IV steroids, have increased her dose to 10 mg IV once daily. -Treated with total of 5 days of IV remdesivir. -Received Actemra 3/11. -Continue with IV diuresis, she is currently on 60 mg IV twice daily, will continue with current low-dose as long renal function tolerates.. -Encouraged the patient to sit up in chair in the daytime use I-S and flutter valve for pulmonary toiletry and then prone in bed when at night. -Continue to  trend inflammatory markers closely  SpO2: 94 % O2 Flow Rate (L/min): 13 L/min  Recent Labs  Lab 06/29/2019 2138 07/05/2019 2138 06/15/19 0320 06/16/19 0405 06/17/19 0335 06/18/19 0259 06/19/19 0240  CRP 7.1*   < > 8.1* 5.8* 4.9* 5.9* 3.9*  DDIMER 2.41*   < > 2.50* 1.85* 2.29* 1.80* 2.24*  FERRITIN 536*  --  529*  --   --   --   --  BNP  --   --  52.3 91.0 88.3 935.0* 100.5*  PROCALCITON 0.11   < > 0.13 0.12 <0.10 0.12 0.12   < > = values in this interval not displayed.    Hepatic Function Latest Ref Rng & Units 06/19/2019 06/18/2019 06/17/2019  Total Protein 6.5 - 8.1 g/dL 6.8 6.1(L) 6.4(L)  Albumin 3.5 - 5.0 g/dL 3.0(L) 2.7(L) 2.9(L)  AST 15 - 41 U/L 44(H) 41 53(H)  ALT 0 - 44 U/L 30 25 30   Alk Phosphatase 38 - 126 U/L 89 79 82  Total Bilirubin 0.3 - 1.2 mg/dL 1.2 1.1 1.5(H)  Bilirubin, Direct 0.0 - 0.3 mg/dL - - -    Mild COVID-19 viral infection related transaminitis.  -  Asymptomatic, trend.  History of atrial tachycardia/a flutter -On Lopressor 50 daily which will be continued. -She is on low-dose Eliquis 2.5 mg p.o. twice daily,  transitioned to full dose Lovenox.  Dyslipidemia.   -On statin  CAD  - CAGB - no chest pain, continue on Lopressor, rosuvastatin, holding ASA due to low platelets.  Hypothyroidism.   -On Synthroid continue.  Anxiety and depression.   -Continue combination of sertraline and as needed Ativan.  Dehydration, generalized weakness, AKI and hypotension.  Blood pressure medications are adjusted, ARB discontinued,  Thrombocytopenia.   - Question if this is due to viral illness,  Monitor closely.  As well clumping likely contributing to low platelet count  Lab Results  Component Value Date   PLT 181 06/19/2019      Condition - Extremely Guarded  Family Communication  : Discussed with husband 3/12, he reports he is actually currently in [place], ED being evaluated, and he would like daughter Cyril Mourning to be point of contact from  tomorrow.  Code Status :  Full  Diet :   Diet Order            Diet Heart Room service appropriate? Yes; Fluid consistency: Thin  Diet effective now               Disposition Plan  : Home with home health once stable, she remains on significant oxygen requirement, remains on IV steroids, and IV diuresis .  Consults  : None  Procedures  : None  PUD Prophylaxis : PPI  DVT Prophylaxis  : Eliquis   Lab Results  Component Value Date   PLT 181 06/19/2019    Inpatient Medications  Scheduled Meds: . vitamin C  500 mg Oral Daily  . dexamethasone (DECADRON) injection  10 mg Intravenous Q24H  . docusate sodium  200 mg Oral BID  . enoxaparin (LOVENOX) injection  110 mg Subcutaneous Q12H  . furosemide  60 mg Intravenous BID  . Ipratropium-Albuterol  1 puff Inhalation Q6H  . levothyroxine  175 mcg Oral Q0600  . mouth rinse  15 mL Mouth Rinse BID  . metoprolol tartrate  25 mg Oral BID  . pantoprazole  40 mg Oral Daily  . rosuvastatin  10 mg Oral QHS  . sertraline  50 mg Oral QHS  . traMADol  50 mg Oral Once  . zinc sulfate  220 mg Oral Daily   Continuous Infusions:  PRN Meds:.acetaminophen, albuterol, chlorpheniramine-HYDROcodone, guaiFENesin-dextromethorphan, LORazepam, ondansetron **OR** ondansetron (ZOFRAN) IV, phenol  Antibiotics  :    Anti-infectives (From admission, onward)   Start     Dose/Rate Route Frequency Ordered Stop   06/16/19 1000  remdesivir 100 mg in sodium chloride 0.9 % 100 mL IVPB  100 mg 200 mL/hr over 30 Minutes Intravenous Daily 06/15/19 0027 06/19/19 1100   06/15/19 1000  remdesivir 100 mg in sodium chloride 0.9 % 100 mL IVPB  Status:  Discontinued     100 mg 200 mL/hr over 30 Minutes Intravenous Daily 06/15/19 0023 06/15/19 0025   06/15/19 0030  remdesivir 200 mg in sodium chloride 0.9% 250 mL IVPB  Status:  Discontinued     200 mg 580 mL/hr over 30 Minutes Intravenous Once 06/15/19 0023 06/15/19 0025   06/15/19 0030  remdesivir 100 mg in  sodium chloride 0.9 % 100 mL IVPB     100 mg 200 mL/hr over 30 Minutes Intravenous Every 30 min 06/15/19 0026 06/15/19 0218       Time Spent in minutes  46   Phillips Climes M.D on 06/19/2019 at 4:46 PM  To page go to [email or web address] - password [facility]  [facility] -  Office  [phone]  See all Orders from today for further details    Objective:   Vitals:   06/19/19 1039 06/19/19 1304 06/19/19 1443 06/19/19 1607  BP:   116/73 102/74  Pulse: 82  80   Resp: 18  (!) 26   Temp:   97.9 F (36.6 C) 98.5 F (36.9 C)  TempSrc:   Oral Oral  SpO2: 94% 96% 94%   Weight:      Height:        Wt Readings from Last 3 Encounters:  06/19/19 108.8 kg  02/19/19 108.6 kg  12/24/18 107.5 kg     Intake/Output Summary (Last 24 hours) at 06/19/2019 1646 Last data filed at 06/19/2019 1306 Gross per 24 hour  Intake 200 ml  Output 451 ml  Net -251 ml     Physical Exam  Awake Alert, Oriented X 3, No new F.N deficits, Normal affect Symmetrical Chest wall movement, Good air movement bilaterally, scattered rales at bases. RRR,No Gallops,Rubs or new Murmurs, No Parasternal Heave +ve B.Sounds, Abd Soft, No tenderness, No rebound - guarding or rigidity. No Cyanosis, Clubbing or edema, No new Rash or bruise        Data Review:    CBC Recent Labs  Lab 06/15/19 0320 06/15/19 0320 06/16/19 0405 06/16/19 1428 06/17/19 0335 06/18/19 0259 06/19/19 0240  WBC 5.0   < > 9.2 10.3 7.9 8.9 11.3*  HGB 12.9   < > 12.5 13.6 12.2 12.1 13.9  HCT 37.4   < > 36.0 38.3 35.4* 35.8* 40.0  PLT 59*   < > PLATELET CLUMPS NOTED ON SMEAR, UNABLE TO ESTIMATE 103* 121* PLATELET CLUMPS NOTED ON SMEAR, UNABLE TO ESTIMATE 181  MCV 98.2   < > 96.8 96.5 96.5 98.6 97.1  MCH 33.9   < > 33.6 34.3* 33.2 33.3 33.7  MCHC 34.5   < > 34.7 35.5 34.5 33.8 34.8  RDW 12.0   < > 11.9 11.9 11.9 12.0 12.0  LYMPHSABS 0.5*  --  0.7  --  0.5* 0.7 0.6*  MONOABS 0.9  --  2.3*  --  1.5* 2.0* 1.2*  EOSABS 0.0  --  0.0   --  0.0 0.0 0.0  BASOSABS 0.0  --  0.0  --  0.0 0.0 0.0   < > = values in this interval not displayed.    Chemistries  Recent Labs  Lab 06/15/19 0320 06/16/19 0405 06/17/19 0335 06/18/19 0259 06/19/19 0240  NA 135 134* 136 137 139  K 3.9 3.6 3.4* 3.8 3.5  CL 96* 95* 100  101 97*  CO2 26 26 24 24 27   GLUCOSE 125* 132* 130* 129* 160*  BUN 15 25* 20 20 23   CREATININE 0.97 1.05* 0.95 0.82 1.02*  CALCIUM 8.6* 8.4* 8.2* 8.0* 8.0*  MG 1.7 1.8 1.6* 2.2 1.8  AST 51* 44* 53* 41 44*  ALT 31 27 30 25 30   ALKPHOS 94 80 82 79 89  BILITOT 1.8* 1.2 1.5* 1.1 1.2   ------------------------------------------------------------------------------------------------------------------ No results for input(s): CHOL, HDL, LDLCALC, TRIG, CHOLHDL, LDLDIRECT in the last 72 hours.  Lab Results  Component Value Date   HGBA1C 5.8 08/17/2014   ------------------------------------------------------------------------------------------------------------------ No results for input(s): TSH, T4TOTAL, T3FREE, THYROIDAB in the last 72 hours.  Invalid input(s): FREET3  Cardiac Enzymes No results for input(s): CKMB, TROPONINI, MYOGLOBIN in the last 168 hours.  Invalid input(s): CK ------------------------------------------------------------------------------------------------------------------    Component Value Date/Time   BNP 100.5 (H) 06/19/2019 0240    Micro Results Recent Results (from the past 240 hour(s))  Blood Culture (routine x 2)     Status: None   Collection Time: 07/05/2019  9:40 PM   Specimen: BLOOD  Result Value Ref Range Status   Specimen Description BLOOD RIGHT ANTECUBITAL  Final   Special Requests   Final    BOTTLES DRAWN AEROBIC AND ANAEROBIC Blood Culture adequate volume   Culture   Final    NO GROWTH 5 DAYS Performed at [facility], [address], [place], [place] [address]    Report Status 06/19/2019 FINAL  Final  Blood Culture (routine x 2)     Status: None    Collection Time: 06/17/2019  9:53 PM   Specimen: BLOOD  Result Value Ref Range Status   Specimen Description BLOOD LEFT ANTECUBITAL  Final   Special Requests   Final    BOTTLES DRAWN AEROBIC AND ANAEROBIC Blood Culture adequate volume   Culture   Final    NO GROWTH 5 DAYS Performed at [facility], [place] [address] [place], [place] [address]    Report Status 06/19/2019 FINAL  Final    Radiology Reports DG Chest Port 1 View  Result Date: 06/18/2019 CLINICAL DATA:  Hypoxia, COVID-19 positive EXAM: PORTABLE CHEST 1 VIEW COMPARISON:  07/08/2019 chest radiograph. FINDINGS: Intact sternotomy wires. Stable cardiomediastinal silhouette with mild cardiomegaly. No pneumothorax. No pleural effusion. Lungs appear clear, with no acute consolidative airspace disease and no pulmonary edema. IMPRESSION: Stable mild cardiomegaly without pulmonary edema. No active pulmonary disease. Electronically Signed   By: Ilona Sorrel M.D.   On: 06/18/2019 11:33   DG Chest Port 1 View  Result Date: 06/25/2019 CLINICAL DATA:  74 year old female with shortness of breath, hypoxia. EXAM: PORTABLE CHEST 1 VIEW COMPARISON:  Chest CTA 09/24/2017 and earlier. FINDINGS: Portable AP view at 2119 hours. Mildly lower lung volumes. Sequelae of CABG. Stable cardiac size and mediastinal contours. Stable thyroidectomy surgical clips. Increased crowding of lung markings at both bases. Allowing for portable technique the lungs are otherwise clear. No pneumothorax. No acute osseous abnormality identified. IMPRESSION: Lower lung volumes with mild bibasilar atelectasis. Electronically Signed   By: Genevie Ann M.D.   On: 07/05/2019 21:32

## 2019-06-19 NOTE — Progress Notes (Signed)
Patient set in the chair for most of the shift, pt on 13L HFNC  oxygen sat 95%, stable at this time no complaints of SOB or discomfort.

## 2019-06-20 ENCOUNTER — Inpatient Hospital Stay (HOSPITAL_COMMUNITY): Payer: Medicare Other

## 2019-06-20 LAB — CBC
HCT: 41.1 % (ref 36.0–46.0)
Hemoglobin: 14.4 g/dL (ref 12.0–15.0)
MCH: 33.4 pg (ref 26.0–34.0)
MCHC: 35 g/dL (ref 30.0–36.0)
MCV: 95.4 fL (ref 80.0–100.0)
Platelets: 240 10*3/uL (ref 150–400)
RBC: 4.31 MIL/uL (ref 3.87–5.11)
RDW: 11.9 % (ref 11.5–15.5)
WBC: 11.1 10*3/uL — ABNORMAL HIGH (ref 4.0–10.5)
nRBC: 0 % (ref 0.0–0.2)

## 2019-06-20 LAB — COMPREHENSIVE METABOLIC PANEL
ALT: 28 U/L (ref 0–44)
AST: 42 U/L — ABNORMAL HIGH (ref 15–41)
Albumin: 3.1 g/dL — ABNORMAL LOW (ref 3.5–5.0)
Alkaline Phosphatase: 89 U/L (ref 38–126)
Anion gap: 16 — ABNORMAL HIGH (ref 5–15)
BUN: 31 mg/dL — ABNORMAL HIGH (ref 8–23)
CO2: 27 mmol/L (ref 22–32)
Calcium: 7.8 mg/dL — ABNORMAL LOW (ref 8.9–10.3)
Chloride: 96 mmol/L — ABNORMAL LOW (ref 98–111)
Creatinine, Ser: 1.13 mg/dL — ABNORMAL HIGH (ref 0.44–1.00)
GFR calc Af Amer: 55 mL/min — ABNORMAL LOW (ref >60)
GFR calc non Af Amer: 48 mL/min — ABNORMAL LOW (ref >60)
Glucose, Bld: 135 mg/dL — ABNORMAL HIGH (ref 70–99)
Potassium: 3 mmol/L — ABNORMAL LOW (ref 3.5–5.1)
Sodium: 139 mmol/L (ref 135–145)
Total Bilirubin: 1.1 mg/dL (ref 0.3–1.2)
Total Protein: 6.9 g/dL (ref 6.5–8.1)

## 2019-06-20 LAB — TSH: TSH: 0.781 u[IU]/mL (ref 0.350–4.500)

## 2019-06-20 LAB — C-REACTIVE PROTEIN: CRP: 2.7 mg/dL — ABNORMAL HIGH (ref <1.0)

## 2019-06-20 LAB — D-DIMER, QUANTITATIVE: D-Dimer, Quant: 2.21 ug/mL-FEU — ABNORMAL HIGH (ref 0.00–0.50)

## 2019-06-20 MED ORDER — FUROSEMIDE 10 MG/ML IJ SOLN: 40.0000 mg | Freq: Once | INTRAMUSCULAR | Status: DC

## 2019-06-20 MED ORDER — MAGNESIUM SULFATE IN D5W 1-5 GM/100ML-% IV SOLN
1.0000 g | Freq: Once | INTRAVENOUS | Status: AC
  Administered 2019-06-20: 1 g via INTRAVENOUS
  Filled 2019-06-20: qty 100

## 2019-06-20 MED ORDER — POTASSIUM CHLORIDE CRYS ER 20 MEQ PO TBCR
40.0000 meq | EXTENDED_RELEASE_TABLET | ORAL | Status: AC
  Administered 2019-06-20 (×3): 40 meq via ORAL
  Filled 2019-06-20 (×3): qty 2

## 2019-06-20 MED ORDER — METOPROLOL TARTRATE 25 MG PO TABS: 25.0000 mg | ORAL_TABLET | Freq: Once | ORAL | Status: DC

## 2019-06-20 MED ORDER — METOPROLOL TARTRATE 50 MG PO TABS
50.0000 mg | ORAL_TABLET | Freq: Two times a day (BID) | ORAL | Status: DC
  Administered 2019-06-20 – 2019-06-25 (×11): 50 mg via ORAL
  Filled 2019-06-20 (×11): qty 1

## 2019-06-20 MED ORDER — DILTIAZEM HCL 25 MG/5ML IV SOLN
10.0000 mg | Freq: Once | INTRAVENOUS | Status: AC
  Administered 2019-06-20: 15:00:00 10 mg via INTRAVENOUS
  Filled 2019-06-20: qty 5

## 2019-06-20 NOTE — Progress Notes (Signed)
PROGRESS NOTE                                                                                                                                                                                                             Patient Demographics:    Breanna Hernandez, is a 74 y.o. female, DOB - [birth year], AOZ:[id number]  Outpatient Primary MD for the patient is Ann Held, DO    LOS - 6  Admit date - 06/09/2019    Chief Complaint  Patient presents with  . Weakness  . Palpitations       Brief Narrative   - Breanna Hernandez is a 74 y.o. female with medical history significant for CAD s/p CABG, atrial tachycardia, history of aortic dissection, history of breast cancer, history of endometrial cancer, history of thyroid cancer with postoperative hypothyroidism, hypertension, hyperlipidemia, and depression/anxiety who presents to the ED for evaluation of fevers, chills, cough, he was diagnosed with acute hypoxic respiratory failure due to COVID-19 pneumonia and admitted to the hospital.   Subjective:   Patient reports significant dyspnea with minimal activity, still reports cough, denies any fever, chest pain or chills .   Assessment  & Plan :     Acute Hypoxic Resp. Failure due to Acute Covid 19 Viral Pneumonitis during the ongoing 2020 Covid 19 Pandemic - she has moderate to severe disease, she continues to have increased oxygen requirement, she remains with significant oxygen requirement, she is on 15 L high flow nasal cannula, and as well we have to add NRB for minimal activity . -Continue with IV steroids, have increased her dose to 10 mg IV once daily. -Treated with total of 5 days of IV remdesivir. -Received Actemra 3/11. -She is on IV diuresis as needed, dose of Lasix today. -Encouraged the patient to sit up in chair in the daytime use I-S and flutter valve for pulmonary toiletry and then prone in bed when at  night. -Continue to trend inflammatory markers closely  SpO2: 94 % O2 Flow Rate (L/min): 15 L/min  Recent Labs  Lab 06/24/2019 2138 06/18/2019 2138 06/15/19 0320 06/15/19 0320 06/16/19 0405 06/17/19 0335 06/18/19 0259 06/19/19 0240 06/20/19 0316  CRP 7.1*   < > 8.1*   < > 5.8* 4.9* 5.9* 3.9* 2.7*  DDIMER 2.41*   < > 2.50*   < >  1.85* 2.29* 1.80* 2.24* 2.21*  FERRITIN 536*  --  529*  --   --   --   --   --   --   BNP  --   --  52.3  --  91.0 88.3 935.0* 100.5*  --   PROCALCITON 0.11   < > 0.13  --  0.12 <0.10 0.12 0.12  --    < > = values in this interval not displayed.    Hepatic Function Latest Ref Rng & Units 06/20/2019 06/19/2019 06/18/2019  Total Protein 6.5 - 8.1 g/dL 6.9 6.8 6.1(L)  Albumin 3.5 - 5.0 g/dL 3.1(L) 3.0(L) 2.7(L)  AST 15 - 41 U/L 42(H) 44(H) 41  ALT 0 - 44 U/L 28 30 25   Alk Phosphatase 38 - 126 U/L 89 89 79  Total Bilirubin 0.3 - 1.2 mg/dL 1.1 1.2 1.1  Bilirubin, Direct 0.0 - 0.3 mg/dL - - -    Mild COVID-19 viral infection related transaminitis.  -  Asymptomatic, trend.  History of atrial tachycardia/a flutter -On Lopressor 50 daily which will be continued.  Heart rate remains uncontrolled, will check TSH -She is on low-dose Eliquis 2.5 mg p.o. twice daily,  transitioned to full dose Lovenox.  Dyslipidemia.   -On statin  CAD  - CAGB - no chest pain, continue on Lopressor, rosuvastatin, holding ASA due to low platelets.  Hypothyroidism.   -On Synthroid continue.  Will check TSH  Anxiety and depression.   -Continue combination of sertraline and as needed Ativan.  Dehydration, generalized weakness, AKI and hypotension.  Blood pressure medications are adjusted, ARB discontinued,  Hypokalemia -Potassium is low today at 3, likely due to diuresis, will replete.  Thrombocytopenia.   - Question if this is due to viral illness,  Monitor closely.  As well clumping likely contributing to low platelet count  Lab Results  Component Value Date   PLT 240  06/20/2019      Condition - Extremely Guarded  Family Communication  : discussed with daughter via phone  Code Status :  Full  Diet :   Diet Order            Diet Heart Room service appropriate? Yes; Fluid consistency: Thin  Diet effective now               Disposition Plan  : Home with home health once stable, she remains on significant oxygen requirement, remains on IV steroids, and IV diuresis .  Consults  : None  Procedures  : None  PUD Prophylaxis : PPI  DVT Prophylaxis  : Eliquis >> lovenox  Lab Results  Component Value Date   PLT 240 06/20/2019    Inpatient Medications  Scheduled Meds: . vitamin C  500 mg Oral Daily  . dexamethasone (DECADRON) injection  10 mg Intravenous Q24H  . docusate sodium  200 mg Oral BID  . enoxaparin (LOVENOX) injection  110 mg Subcutaneous Q12H  . Ipratropium-Albuterol  1 puff Inhalation Q6H  . levothyroxine  175 mcg Oral Q0600  . mouth rinse  15 mL Mouth Rinse BID  . metoprolol tartrate  25 mg Oral BID  . pantoprazole  40 mg Oral Daily  . rosuvastatin  10 mg Oral QHS  . sertraline  50 mg Oral QHS  . traMADol  50 mg Oral Once  . zinc sulfate  220 mg Oral Daily   Continuous Infusions:  PRN Meds:.acetaminophen, albuterol, chlorpheniramine-HYDROcodone, guaiFENesin-dextromethorphan, LORazepam, ondansetron **OR** ondansetron (ZOFRAN) IV, phenol  Antibiotics  :  Anti-infectives (From admission, onward)   Start     Dose/Rate Route Frequency Ordered Stop   06/16/19 1000  remdesivir 100 mg in sodium chloride 0.9 % 100 mL IVPB     100 mg 200 mL/hr over 30 Minutes Intravenous Daily 06/15/19 0027 06/19/19 1100   06/15/19 1000  remdesivir 100 mg in sodium chloride 0.9 % 100 mL IVPB  Status:  Discontinued     100 mg 200 mL/hr over 30 Minutes Intravenous Daily 06/15/19 0023 06/15/19 0025   06/15/19 0030  remdesivir 200 mg in sodium chloride 0.9% 250 mL IVPB  Status:  Discontinued     200 mg 580 mL/hr over 30 Minutes  Intravenous Once 06/15/19 0023 06/15/19 0025   06/15/19 0030  remdesivir 100 mg in sodium chloride 0.9 % 100 mL IVPB     100 mg 200 mL/hr over 30 Minutes Intravenous Every 30 min 06/15/19 0026 06/15/19 0218       Time Spent in minutes  73   Emeline Gins Shavy Beachem M.D on 06/20/2019 at 1:43 PM  To page go to [email or web address] - password [facility]  [facility] -  Office  [phone]  See all Orders from today for further details    Objective:   Vitals:   06/19/19 2120 06/20/19 0504 06/20/19 1022 06/20/19 1200  BP: (!) 103/57 116/88 120/72 114/74  Pulse: 89 78 (!) 103 (!) 54  Resp: 14 20  (!) 24  Temp: 97.8 F (36.6 C) 97.8 F (36.6 C)  98.4 F (36.9 C)  TempSrc: Oral Oral  Oral  SpO2: 90% 94%  94%  Weight:      Height:        Wt Readings from Last 3 Encounters:  06/19/19 108.8 kg  02/19/19 108.6 kg  12/24/18 107.5 kg     Intake/Output Summary (Last 24 hours) at 06/20/2019 1343 Last data filed at 06/20/2019 1300 Gross per 24 hour  Intake 480 ml  Output 250 ml  Net 230 ml     Physical Exam  Awake Alert, Oriented X 3, No new F.N deficits, Normal affect Symmetrical Chest wall movement, Good air movement bilaterally, scattered rales Tachycardic,No Gallops,Rubs or new Murmurs, No Parasternal Heave +ve B.Sounds, Abd Soft, No tenderness, No rebound - guarding or rigidity. No Cyanosis, Clubbing or edema, No new Rash or bruise        Data Review:    CBC Recent Labs  Lab 06/15/19 0320 06/15/19 0320 06/16/19 0405 06/16/19 0405 06/16/19 1428 06/17/19 0335 06/18/19 0259 06/19/19 0240 06/20/19 0316  WBC 5.0   < > 9.2   < > 10.3 7.9 8.9 11.3* 11.1*  HGB 12.9   < > 12.5   < > 13.6 12.2 12.1 13.9 14.4  HCT 37.4   < > 36.0   < > 38.3 35.4* 35.8* 40.0 41.1  PLT 59*   < > PLATELET CLUMPS NOTED ON SMEAR, UNABLE TO ESTIMATE   < > 103* 121* PLATELET CLUMPS NOTED ON SMEAR, UNABLE TO ESTIMATE 181 240  MCV 98.2   < > 96.8   < > 96.5 96.5 98.6 97.1 95.4  MCH 33.9   < > 33.6    < > 34.3* 33.2 33.3 33.7 33.4  MCHC 34.5   < > 34.7   < > 35.5 34.5 33.8 34.8 35.0  RDW 12.0   < > 11.9   < > 11.9 11.9 12.0 12.0 11.9  LYMPHSABS 0.5*  --  0.7  --   --  0.5* 0.7 0.6*  --  MONOABS 0.9  --  2.3*  --   --  1.5* 2.0* 1.2*  --   EOSABS 0.0  --  0.0  --   --  0.0 0.0 0.0  --   BASOSABS 0.0  --  0.0  --   --  0.0 0.0 0.0  --    < > = values in this interval not displayed.    Chemistries  Recent Labs  Lab 06/15/19 0320 06/15/19 0320 06/16/19 0405 06/17/19 0335 06/18/19 0259 06/19/19 0240 06/20/19 0316  NA 135   < > 134* 136 137 139 139  K 3.9   < > 3.6 3.4* 3.8 3.5 3.0*  CL 96*   < > 95* 100 101 97* 96*  CO2 26   < > 26 24 24 27 27   GLUCOSE 125*   < > 132* 130* 129* 160* 135*  BUN 15   < > 25* 20 20 23  31*  CREATININE 0.97   < > 1.05* 0.95 0.82 1.02* 1.13*  CALCIUM 8.6*   < > 8.4* 8.2* 8.0* 8.0* 7.8*  MG 1.7  --  1.8 1.6* 2.2 1.8  --   AST 51*   < > 44* 53* 41 44* 42*  ALT 31   < > 27 30 25 30 28   ALKPHOS 94   < > 80 82 79 89 89  BILITOT 1.8*   < > 1.2 1.5* 1.1 1.2 1.1   < > = values in this interval not displayed.   ------------------------------------------------------------------------------------------------------------------ No results for input(s): CHOL, HDL, LDLCALC, TRIG, CHOLHDL, LDLDIRECT in the last 72 hours.  Lab Results  Component Value Date   HGBA1C 5.8 08/17/2014   ------------------------------------------------------------------------------------------------------------------ No results for input(s): TSH, T4TOTAL, T3FREE, THYROIDAB in the last 72 hours.  Invalid input(s): FREET3  Cardiac Enzymes No results for input(s): CKMB, TROPONINI, MYOGLOBIN in the last 168 hours.  Invalid input(s): CK ------------------------------------------------------------------------------------------------------------------    Component Value Date/Time   BNP 100.5 (H) 06/19/2019 0240    Micro Results Recent Results (from the past 240 hour(s))  Blood  Culture (routine x 2)     Status: None   Collection Time: 07/02/2019  9:40 PM   Specimen: BLOOD  Result Value Ref Range Status   Specimen Description BLOOD RIGHT ANTECUBITAL  Final   Special Requests   Final    BOTTLES DRAWN AEROBIC AND ANAEROBIC Blood Culture adequate volume   Culture   Final    NO GROWTH 5 DAYS Performed at [facility], [address], [place], [place] [address]    Report Status 06/19/2019 FINAL  Final  Blood Culture (routine x 2)     Status: None   Collection Time: 06/17/2019  9:53 PM   Specimen: BLOOD  Result Value Ref Range Status   Specimen Description BLOOD LEFT ANTECUBITAL  Final   Special Requests   Final    BOTTLES DRAWN AEROBIC AND ANAEROBIC Blood Culture adequate volume   Culture   Final    NO GROWTH 5 DAYS Performed at [facility], [place] [address] [place], [place] [address]    Report Status 06/19/2019 FINAL  Final    Radiology Reports DG Chest Port 1 View  Result Date: 06/20/2019 CLINICAL DATA:  Acute hypoxemic respiratory failure due to COVID-19. EXAM: PORTABLE CHEST 1 VIEW COMPARISON:  06/18/2019 FINDINGS: Hazy bilateral airspace lung opacities are noted in the central and lower lungs consistent with multifocal pneumonia. No convincing pleural effusion.  No pneumothorax. Stable changes from  prior CABG surgery. IMPRESSION: 1. Since the previous exam, hazy bilateral airspace lung opacities have developed in the central and lower lungs consistent with multifocal COVID-19 pneumonia. Electronically Signed   By: Lajean Manes M.D.   On: 06/20/2019 09:17   DG Chest Port 1 View  Result Date: 06/18/2019 CLINICAL DATA:  Hypoxia, COVID-19 positive EXAM: PORTABLE CHEST 1 VIEW COMPARISON:  06/16/2019 chest radiograph. FINDINGS: Intact sternotomy wires. Stable cardiomediastinal silhouette with mild cardiomegaly. No pneumothorax. No pleural effusion. Lungs appear clear, with no acute consolidative airspace disease and no pulmonary edema. IMPRESSION: Stable  mild cardiomegaly without pulmonary edema. No active pulmonary disease. Electronically Signed   By: Ilona Sorrel M.D.   On: 06/18/2019 11:33   DG Chest Port 1 View  Result Date: 07/03/2019 CLINICAL DATA:  74 year old female with shortness of breath, hypoxia. EXAM: PORTABLE CHEST 1 VIEW COMPARISON:  Chest CTA 09/24/2017 and earlier. FINDINGS: Portable AP view at 2119 hours. Mildly lower lung volumes. Sequelae of CABG. Stable cardiac size and mediastinal contours. Stable thyroidectomy surgical clips. Increased crowding of lung markings at both bases. Allowing for portable technique the lungs are otherwise clear. No pneumothorax. No acute osseous abnormality identified. IMPRESSION: Lower lung volumes with mild bibasilar atelectasis. Electronically Signed   By: Genevie Ann M.D.   On: 06/30/2019 21:32

## 2019-06-20 NOTE — Progress Notes (Signed)
Updated pt daughter  ° °

## 2019-06-21 LAB — COMPREHENSIVE METABOLIC PANEL
ALT: 45 U/L — ABNORMAL HIGH (ref 0–44)
AST: 53 U/L — ABNORMAL HIGH (ref 15–41)
Albumin: 3.3 g/dL — ABNORMAL LOW (ref 3.5–5.0)
Alkaline Phosphatase: 95 U/L (ref 38–126)
Anion gap: 14 (ref 5–15)
BUN: 43 mg/dL — ABNORMAL HIGH (ref 8–23)
CO2: 26 mmol/L (ref 22–32)
Calcium: 8.2 mg/dL — ABNORMAL LOW (ref 8.9–10.3)
Chloride: 99 mmol/L (ref 98–111)
Creatinine, Ser: 1.31 mg/dL — ABNORMAL HIGH (ref 0.44–1.00)
GFR calc Af Amer: 46 mL/min — ABNORMAL LOW (ref >60)
GFR calc non Af Amer: 40 mL/min — ABNORMAL LOW (ref >60)
Glucose, Bld: 150 mg/dL — ABNORMAL HIGH (ref 70–99)
Potassium: 4.7 mmol/L (ref 3.5–5.1)
Sodium: 139 mmol/L (ref 135–145)
Total Bilirubin: 1.6 mg/dL — ABNORMAL HIGH (ref 0.3–1.2)
Total Protein: 7.1 g/dL (ref 6.5–8.1)

## 2019-06-21 LAB — CBC
HCT: 44 % (ref 36.0–46.0)
Hemoglobin: 15.4 g/dL — ABNORMAL HIGH (ref 12.0–15.0)
MCH: 33.9 pg (ref 26.0–34.0)
MCHC: 35 g/dL (ref 30.0–36.0)
MCV: 96.9 fL (ref 80.0–100.0)
Platelets: 337 10*3/uL (ref 150–400)
RBC: 4.54 MIL/uL (ref 3.87–5.11)
RDW: 12.2 % (ref 11.5–15.5)
WBC: 13.4 10*3/uL — ABNORMAL HIGH (ref 4.0–10.5)
nRBC: 0.1 % (ref 0.0–0.2)

## 2019-06-21 LAB — BRAIN NATRIURETIC PEPTIDE: B Natriuretic Peptide: 62.5 pg/mL (ref 0.0–100.0)

## 2019-06-21 LAB — MAGNESIUM: Magnesium: 2.1 mg/dL (ref 1.7–2.4)

## 2019-06-21 LAB — C-REACTIVE PROTEIN: CRP: 1.4 mg/dL — ABNORMAL HIGH (ref <1.0)

## 2019-06-21 MED ORDER — GUAIFENESIN ER 600 MG PO TB12
1200.0000 mg | ORAL_TABLET | Freq: Two times a day (BID) | ORAL | Status: DC
  Administered 2019-06-21 – 2019-06-25 (×10): 1200 mg via ORAL
  Filled 2019-06-21 (×10): qty 2

## 2019-06-21 MED ORDER — ENSURE ENLIVE PO LIQD
237.0000 mL | Freq: Three times a day (TID) | ORAL | Status: DC
  Administered 2019-06-21 – 2019-06-25 (×12): 237 mL via ORAL

## 2019-06-21 MED ORDER — DEXAMETHASONE SODIUM PHOSPHATE 10 MG/ML IJ SOLN
6.0000 mg | INTRAMUSCULAR | Status: AC
  Administered 2019-06-21 – 2019-06-23 (×3): 6 mg via INTRAVENOUS
  Filled 2019-06-21 (×3): qty 1

## 2019-06-21 NOTE — Progress Notes (Signed)
PROGRESS NOTE                                                                                                                                                                                                             Patient Demographics:    Breanna Hernandez, is a 74 y.o. female, DOB - 1945-07-31, CBJ:[id number]  Outpatient Primary MD for the patient is Ann Held, DO    LOS - 7  Admit date - 06/30/2019    Chief Complaint  Patient presents with  . Weakness  . Palpitations       Brief Narrative   - Breanna Hernandez is a 74 y.o. female with medical history significant for CAD s/p CABG, atrial tachycardia, history of aortic dissection, history of breast cancer, history of endometrial cancer, history of thyroid cancer with postoperative hypothyroidism, hypertension, hyperlipidemia, and depression/anxiety who presents to the ED for evaluation of fevers, chills, cough, he was diagnosed with acute hypoxic respiratory failure due to COVID-19 pneumonia and admitted to the hospital.   Subjective:   Patient reports no significant improvement with her dyspnea, reports cough, but report appetite has slightly improved .   Assessment  & Plan :     Acute Hypoxic Resp. Failure due to Acute Covid 19 Viral Pneumonitis during the ongoing 2020 Covid 19 Pandemic - she has severe disease, she continues to have increased oxygen requirement, at this point she does require both of her 15 L high flow nasal cannula, and NRB as well . -Continue with IV steroids. -Treated with total of 5 days of IV remdesivir. -Received Actemra 3/11. -She is on IV diuresis as needed, hold on IV Lasix today, given her BUN and creatinine slightly up, she does appear to be on the dry side today.  And her BNP has normalized. -Encouraged the patient to sit up in chair in the daytime use I-S and flutter valve for pulmonary toiletry and then prone in bed when at  night. -Continue to trend inflammatory markers closely  SpO2: 96 % O2 Flow Rate (L/min): 15 L/min  Recent Labs  Lab 06/27/2019 2138 06/22/2019 2138 06/15/19 0320 06/15/19 0320 06/16/19 0405 06/16/19 0405 06/17/19 0335 06/18/19 0259 06/19/19 0240 06/20/19 0316 06/21/19 0722  CRP 7.1*   < > 8.1*   < > 5.8*   < > 4.9* 5.9* 3.9* 2.7*  1.4*  DDIMER 2.41*   < > 2.50*   < > 1.85*  --  2.29* 1.80* 2.24* 2.21*  --   FERRITIN 536*  --  529*  --   --   --   --   --   --   --   --   BNP  --   --  52.3   < > 91.0  --  88.3 935.0* 100.5*  --  62.5  PROCALCITON 0.11   < > 0.13  --  0.12  --  <0.10 0.12 0.12  --   --    < > = values in this interval not displayed.    Hepatic Function Latest Ref Rng & Units 06/21/2019 06/20/2019 06/19/2019  Total Protein 6.5 - 8.1 g/dL 7.1 6.9 6.8  Albumin 3.5 - 5.0 g/dL 3.3(L) 3.1(L) 3.0(L)  AST 15 - 41 U/L 53(H) 42(H) 44(H)  ALT 0 - 44 U/L 45(H) 28 30  Alk Phosphatase 38 - 126 U/L 95 89 89  Total Bilirubin 0.3 - 1.2 mg/dL 1.6(H) 1.1 1.2  Bilirubin, Direct 0.0 - 0.3 mg/dL - - -    Mild COVID-19 viral infection related transaminitis.  -  Asymptomatic, trend.  A. fib with RVR/a flutter -Rhythm continues to be alternating between A. fib with RVR, and a flutter, peers to be more controlled currently. -TSH within normal limits. -She had an episode of A. fib with RVR yesterday, but this has improved with IV Cardizem push and increasing metoprolol to 50 mg oral twice daily. -She is on low-dose Eliquis 2.5 mg p.o. twice daily,  transitioned to full dose Lovenox.  Dyslipidemia.   -On statin  CAD  - CAGB - no chest pain, continue on Lopressor, rosuvastatin, holding ASA due to low platelets.  Hypothyroidism.   -On Synthroid continue.  TSH within normal limits  Anxiety and depression.   -Continue combination of sertraline and as needed Ativan.  Dehydration, generalized weakness, AKI and hypotension.  Blood pressure medications are adjusted, ARB  discontinued,  Hypokalemia -Repleted  Thrombocytopenia.   - Question if this is due to viral illness, this has resolved currently  Lab Results  Component Value Date   PLT 337 06/21/2019      Condition - Extremely Guarded  Family Communication  : discussed with daughter via phone 06/20/2019  Code Status :  Full  Diet :   Diet Order            Diet Heart Room service appropriate? Yes; Fluid consistency: Thin  Diet effective now               Disposition Plan  : Home with home health once stable, she remains on significant oxygen requirement, remains on IV steroids, and IV diuresis .  Consults  : None  Procedures  : None  PUD Prophylaxis : PPI  DVT Prophylaxis  : Eliquis >> lovenox  Lab Results  Component Value Date   PLT 337 06/21/2019    Inpatient Medications  Scheduled Meds: . vitamin C  500 mg Oral Daily  . dexamethasone (DECADRON) injection  10 mg Intravenous Q24H  . docusate sodium  200 mg Oral BID  . enoxaparin (LOVENOX) injection  110 mg Subcutaneous Q12H  . Ipratropium-Albuterol  1 puff Inhalation Q6H  . levothyroxine  175 mcg Oral Q0600  . mouth rinse  15 mL Mouth Rinse BID  . metoprolol tartrate  50 mg Oral BID  . pantoprazole  40 mg Oral Daily  . rosuvastatin  10 mg Oral QHS  . sertraline  50 mg Oral QHS  . traMADol  50 mg Oral Once  . zinc sulfate  220 mg Oral Daily   Continuous Infusions:  PRN Meds:.acetaminophen, albuterol, chlorpheniramine-HYDROcodone, guaiFENesin-dextromethorphan, LORazepam, ondansetron **OR** ondansetron (ZOFRAN) IV, phenol  Antibiotics  :    Anti-infectives (From admission, onward)   Start     Dose/Rate Route Frequency Ordered Stop   06/16/19 1000  remdesivir 100 mg in sodium chloride 0.9 % 100 mL IVPB     100 mg 200 mL/hr over 30 Minutes Intravenous Daily 06/15/19 0027 06/19/19 1100   06/15/19 1000  remdesivir 100 mg in sodium chloride 0.9 % 100 mL IVPB  Status:  Discontinued     100 mg 200 mL/hr over 30  Minutes Intravenous Daily 06/15/19 0023 06/15/19 0025   06/15/19 0030  remdesivir 200 mg in sodium chloride 0.9% 250 mL IVPB  Status:  Discontinued     200 mg 580 mL/hr over 30 Minutes Intravenous Once 06/15/19 0023 06/15/19 0025   06/15/19 0030  remdesivir 100 mg in sodium chloride 0.9 % 100 mL IVPB     100 mg 200 mL/hr over 30 Minutes Intravenous Every 30 min 06/15/19 0026 06/15/19 0218       Time Spent in minutes  74   Emeline Gins Elgergawy M.D on 06/21/2019 at 1:27 PM  To page go to [email or web address] - password [facility]  [facility] -  Office  [phone]  See all Orders from today for further details    Objective:   Vitals:   06/20/19 2345 06/21/19 0500 06/21/19 0928 06/21/19 1231  BP:   112/73 116/72  Pulse: 74 93 (!) 101 97  Resp: (!) 27 17  (!) 21  Temp:    98.6 F (37 C)  TempSrc:    Oral  SpO2: 95% 95%  96%  Weight:      Height:        Wt Readings from Last 3 Encounters:  06/19/19 108.8 kg  02/19/19 108.6 kg  12/24/18 107.5 kg     Intake/Output Summary (Last 24 hours) at 06/21/2019 1327 Last data filed at 06/21/2019 0845 Gross per 24 hour  Intake 580 ml  Output 450 ml  Net 130 ml     Physical Exam  Awake Alert, Oriented X 3, No new F.N deficits, Normal affect Symmetrical Chest wall movement, Good air movement bilaterally, scattered rales RRR,No Gallops,Rubs or new Murmurs, No Parasternal Heave +ve B.Sounds, Abd Soft, No tenderness, No rebound - guarding or rigidity. No Cyanosis, Clubbing or edema, No new Rash or bruise      Data Review:    CBC Recent Labs  Lab 06/15/19 0320 06/15/19 0320 06/16/19 0405 06/16/19 1428 06/17/19 0335 06/18/19 0259 06/19/19 0240 06/20/19 0316 06/21/19 0722  WBC 5.0   < > 9.2   < > 7.9 8.9 11.3* 11.1* 13.4*  HGB 12.9   < > 12.5   < > 12.2 12.1 13.9 14.4 15.4*  HCT 37.4   < > 36.0   < > 35.4* 35.8* 40.0 41.1 44.0  PLT 59*   < > PLATELET CLUMPS NOTED ON SMEAR, UNABLE TO ESTIMATE   < > 121* PLATELET CLUMPS NOTED  ON SMEAR, UNABLE TO ESTIMATE 181 240 337  MCV 98.2   < > 96.8   < > 96.5 98.6 97.1 95.4 96.9  MCH 33.9   < > 33.6   < > 33.2 33.3 33.7 33.4 33.9  MCHC 34.5   < >  34.7   < > 34.5 33.8 34.8 35.0 35.0  RDW 12.0   < > 11.9   < > 11.9 12.0 12.0 11.9 12.2  LYMPHSABS 0.5*  --  0.7  --  0.5* 0.7 0.6*  --   --   MONOABS 0.9  --  2.3*  --  1.5* 2.0* 1.2*  --   --   EOSABS 0.0  --  0.0  --  0.0 0.0 0.0  --   --   BASOSABS 0.0  --  0.0  --  0.0 0.0 0.0  --   --    < > = values in this interval not displayed.    Chemistries  Recent Labs  Lab 06/16/19 0405 06/16/19 0405 06/17/19 0335 06/18/19 0259 06/19/19 0240 06/20/19 0316 06/21/19 0722  NA 134*   < > 136 137 139 139 139  K 3.6   < > 3.4* 3.8 3.5 3.0* 4.7  CL 95*   < > 100 101 97* 96* 99  CO2 26   < > _0 GLUCOSE 132*   < > 130* 129* 160* 135* 150*  BUN 25*   < > _1 31* 43*  CREATININE 1.05*   < > 0.95 0.82 1.02* 1.13* 1.31*  CALCIUM 8.4*   < > 8.2* 8.0* 8.0* 7.8* 8.2*  MG 1.8  --  1.6* 2.2 1.8  --  2.1  AST 44*   < > 53* 41 44* 42* 53*  ALT 27   < > _2 45*  ALKPHOS 80   < > 82 79 89 89 95  BILITOT 1.2   < > 1.5* 1.1 1.2 1.1 1.6*   < > = values in this interval not displayed.   ------------------------------------------------------------------------------------------------------------------ No results for input(s): CHOL, HDL, LDLCALC, TRIG, CHOLHDL, LDLDIRECT in the last 72 hours.  Lab Results  Component Value Date   HGBA1C 5.8 08/17/2014   ------------------------------------------------------------------------------------------------------------------ Recent Labs    06/20/19 0316  TSH 0.781    Cardiac Enzymes No results for input(s): CKMB, TROPONINI, MYOGLOBIN in the last 168 hours.  Invalid input(s): CK ------------------------------------------------------------------------------------------------------------------    Component Value Date/Time   BNP 62.5 06/21/2019 3343    Micro  Results Recent Results (from the past 240 hour(s))  Blood Culture (routine x 2)     Status: None   Collection Time: 06/29/2019  9:40 PM   Specimen: BLOOD  Result Value Ref Range Status   Specimen Description BLOOD RIGHT ANTECUBITAL  Final   Special Requests   Final    BOTTLES DRAWN AEROBIC AND ANAEROBIC Blood Culture adequate volume   Culture   Final    NO GROWTH 5 DAYS Performed at [facility], [address] [place], [place] [address]    Report Status 06/19/2019 FINAL  Final  Blood Culture (routine x 2)     Status: None   Collection Time: 06/15/2019  9:53 PM   Specimen: BLOOD  Result Value Ref Range Status   Specimen Description BLOOD LEFT ANTECUBITAL  Final   Special Requests   Final    BOTTLES DRAWN AEROBIC AND ANAEROBIC Blood Culture adequate volume   Culture   Final    NO GROWTH 5 DAYS Performed at [facility], [place] [address] [place], [place] [address]    Report Status 06/19/2019 FINAL  Final    Radiology Reports DG Chest Port 1 View  Result Date: 06/20/2019 CLINICAL DATA:  Acute hypoxemic  respiratory failure due to COVID-19. EXAM: PORTABLE CHEST 1 VIEW COMPARISON:  06/18/2019 FINDINGS: Hazy bilateral airspace lung opacities are noted in the central and lower lungs consistent with multifocal pneumonia. No convincing pleural effusion.  No pneumothorax. Stable changes from prior CABG surgery. IMPRESSION: 1. Since the previous exam, hazy bilateral airspace lung opacities have developed in the central and lower lungs consistent with multifocal COVID-19 pneumonia. Electronically Signed   By: Lajean Manes M.D.   On: 06/20/2019 09:17   DG Chest Port 1 View  Result Date: 06/18/2019 CLINICAL DATA:  Hypoxia, COVID-19 positive EXAM: PORTABLE CHEST 1 VIEW COMPARISON:  06/29/2019 chest radiograph. FINDINGS: Intact sternotomy wires. Stable cardiomediastinal silhouette with mild cardiomegaly. No pneumothorax. No pleural effusion. Lungs appear clear, with no acute consolidative  airspace disease and no pulmonary edema. IMPRESSION: Stable mild cardiomegaly without pulmonary edema. No active pulmonary disease. Electronically Signed   By: Ilona Sorrel M.D.   On: 06/18/2019 11:33   DG Chest Port 1 View  Result Date: 06/10/2019 CLINICAL DATA:  74 year old female with shortness of breath, hypoxia. EXAM: PORTABLE CHEST 1 VIEW COMPARISON:  Chest CTA 09/24/2017 and earlier. FINDINGS: Portable AP view at 2119 hours. Mildly lower lung volumes. Sequelae of CABG. Stable cardiac size and mediastinal contours. Stable thyroidectomy surgical clips. Increased crowding of lung markings at both bases. Allowing for portable technique the lungs are otherwise clear. No pneumothorax. No acute osseous abnormality identified. IMPRESSION: Lower lung volumes with mild bibasilar atelectasis. Electronically Signed   By: Genevie Ann M.D.   On: 06/11/2019 21:32

## 2019-06-21 NOTE — Progress Notes (Signed)
Pt awake and alert. Family member not updated per patients request. Will continue to monitor.

## 2019-06-22 DIAGNOSIS — I4891 Unspecified atrial fibrillation: Secondary | ICD-10-CM

## 2019-06-22 LAB — COMPREHENSIVE METABOLIC PANEL
ALT: 75 U/L — ABNORMAL HIGH (ref 0–44)
AST: 88 U/L — ABNORMAL HIGH (ref 15–41)
Albumin: 3.2 g/dL — ABNORMAL LOW (ref 3.5–5.0)
Alkaline Phosphatase: 113 U/L (ref 38–126)
Anion gap: 11 (ref 5–15)
BUN: 42 mg/dL — ABNORMAL HIGH (ref 8–23)
CO2: 27 mmol/L (ref 22–32)
Calcium: 8.6 mg/dL — ABNORMAL LOW (ref 8.9–10.3)
Chloride: 99 mmol/L (ref 98–111)
Creatinine, Ser: 1.17 mg/dL — ABNORMAL HIGH (ref 0.44–1.00)
GFR calc Af Amer: 53 mL/min — ABNORMAL LOW (ref >60)
GFR calc non Af Amer: 46 mL/min — ABNORMAL LOW (ref >60)
Glucose, Bld: 137 mg/dL — ABNORMAL HIGH (ref 70–99)
Potassium: 4 mmol/L (ref 3.5–5.1)
Sodium: 137 mmol/L (ref 135–145)
Total Bilirubin: 1.7 mg/dL — ABNORMAL HIGH (ref 0.3–1.2)
Total Protein: 7 g/dL (ref 6.5–8.1)

## 2019-06-22 LAB — CBC
HCT: 42 % (ref 36.0–46.0)
Hemoglobin: 15 g/dL (ref 12.0–15.0)
MCH: 33.9 pg (ref 26.0–34.0)
MCHC: 35.7 g/dL (ref 30.0–36.0)
MCV: 94.8 fL (ref 80.0–100.0)
Platelets: 314 10*3/uL (ref 150–400)
RBC: 4.43 MIL/uL (ref 3.87–5.11)
RDW: 12 % (ref 11.5–15.5)
WBC: 12.8 10*3/uL — ABNORMAL HIGH (ref 4.0–10.5)
nRBC: 0 % (ref 0.0–0.2)

## 2019-06-22 LAB — C-REACTIVE PROTEIN: CRP: 0.8 mg/dL (ref <1.0)

## 2019-06-22 NOTE — Progress Notes (Signed)
PROGRESS NOTE                                                                                                                                                                                                             Patient Demographics:    Breanna Hernandez, is a 74 y.o. female, DOB - 1945-10-14, HRC:[id number]  Outpatient Primary MD for the patient is Ann Held, DO    LOS - 8  Admit date - 06/19/2019    Chief Complaint  Patient presents with  . Weakness  . Palpitations       Brief Narrative    - Breanna Hernandez is a 74 y.o. female with medical history significant for CAD s/p CABG, atrial tachycardia, history of aortic dissection, history of breast cancer, history of endometrial cancer, history of thyroid cancer with postoperative hypothyroidism, hypertension, hyperlipidemia, and depression/anxiety who presents to the ED for evaluation of fevers, chills, cough, he was diagnosed with acute hypoxic respiratory failure due to COVID-19 pneumonia and admitted to the hospital.   Subjective:   Patient reports generalized weakness today, reports appetite is improving, still reports some dyspnea and cough .   Assessment  & Plan :     Acute Hypoxic Resp. Failure due to Acute Covid 19 Viral Pneumonitis during the ongoing 2020 Covid 19 Pandemic - she has severe disease, did have significant oxygen requirement, yesterday she where requiring 15 L high flow nasal cannula, with NRB intermittently, this morning appears to be improving, she is on 9 L this morning . -Continue with IV steroids. -Treated with total of 5 days of IV remdesivir. -Received Actemra 3/11. -She is on IV diuresis as needed, again well hold on IV Lasix today, given her BUN and creatinine slightly up, she does appear to be on the dry side today.  And her BNP has normalized. -Encouraged the patient to sit up in chair in the daytime use I-S and flutter valve  for pulmonary toiletry and then prone in bed when at night. -Continue to trend inflammatory markers closely  SpO2: (!) 87 % O2 Flow Rate (L/min): 9 L/min  Recent Labs  Lab 06/16/19 0405 06/16/19 0405 06/17/19 0335 06/17/19 0335 06/18/19 0259 06/19/19 0240 06/20/19 0316 06/21/19 0722 06/22/19 0239  CRP 5.8*   < > 4.9*   < > 5.9* 3.9* 2.7* 1.4* 0.8  DDIMER 1.85*  --  2.29*  --  1.80* 2.24* 2.21*  --   --   BNP 91.0  --  88.3  --  935.0* 100.5*  --  62.5  --   PROCALCITON 0.12  --  <0.10  --  0.12 0.12  --   --   --    < > = values in this interval not displayed.    Hepatic Function Latest Ref Rng & Units 06/22/2019 06/21/2019 06/20/2019  Total Protein 6.5 - 8.1 g/dL 7.0 7.1 6.9  Albumin 3.5 - 5.0 g/dL 3.2(L) 3.3(L) 3.1(L)  AST 15 - 41 U/L 88(H) 53(H) 42(H)  ALT 0 - 44 U/L 75(H) 45(H) 28  Alk Phosphatase 38 - 126 U/L 113 95 89  Total Bilirubin 0.3 - 1.2 mg/dL 1.7(H) 1.6(H) 1.1  Bilirubin, Direct 0.0 - 0.3 mg/dL - - -    Mild COVID-19 viral infection related transaminitis.  -  Asymptomatic, trend.  A. fib with RVR/a flutter -Rhythm continues to be alternating between A. fib with RVR, and a flutter, peers to be more controlled currently. -TSH within normal limits. -Rate seems to be controlled metoprolol 50 mg p.o. twice daily .  metoprolol to 50 mg oral twice daily. -She is on low-dose Eliquis 2.5 mg p.o. twice daily,  transitioned to full dose Lovenox.  Dyslipidemia.   -On statin  CAD  - CAGB - no chest pain, continue on Lopressor, rosuvastatin, holding ASA due to low platelets.  Hypothyroidism.   -On Synthroid continue.  TSH within normal limits  Anxiety and depression.   -Continue combination of sertraline and as needed Ativan.  Dehydration, generalized weakness, AKI and hypotension.  Blood pressure medications are adjusted, ARB discontinued,  Hypokalemia -Repleted  Thrombocytopenia.   - Question if this is due to viral illness, this has resolved  currently  Lab Results  Component Value Date   PLT 314 06/22/2019      Condition - Extremely Guarded  Family Communication  : discussed with daughter via phone 06/20/2019  Code Status :  Full  Diet :   Diet Order            Diet Heart Room service appropriate? Yes; Fluid consistency: Thin  Diet effective now               Disposition Plan  : Home with home health once stable, she remains on significant oxygen requirement, remains on IV steroids, and IV diuresis .  Consults  : None  Procedures  : None  PUD Prophylaxis : PPI  DVT Prophylaxis  : Eliquis >> lovenox  Lab Results  Component Value Date   PLT 314 06/22/2019    Inpatient Medications  Scheduled Meds: . vitamin C  500 mg Oral Daily  . dexamethasone (DECADRON) injection  6 mg Intravenous Q24H  . docusate sodium  200 mg Oral BID  . enoxaparin (LOVENOX) injection  110 mg Subcutaneous Q12H  . feeding supplement (ENSURE ENLIVE)  237 mL Oral TID BM  . guaiFENesin  1,200 mg Oral BID  . Ipratropium-Albuterol  1 puff Inhalation Q6H  . levothyroxine  175 mcg Oral Q0600  . mouth rinse  15 mL Mouth Rinse BID  . metoprolol tartrate  50 mg Oral BID  . pantoprazole  40 mg Oral Daily  . rosuvastatin  10 mg Oral QHS  . sertraline  50 mg Oral QHS  . traMADol  50 mg Oral Once  . zinc sulfate  220 mg Oral Daily  Continuous Infusions:  PRN Meds:.acetaminophen, albuterol, chlorpheniramine-HYDROcodone, guaiFENesin-dextromethorphan, LORazepam, ondansetron **OR** ondansetron (ZOFRAN) IV, phenol  Antibiotics  :    Anti-infectives (From admission, onward)   Start     Dose/Rate Route Frequency Ordered Stop   06/16/19 1000  remdesivir 100 mg in sodium chloride 0.9 % 100 mL IVPB     100 mg 200 mL/hr over 30 Minutes Intravenous Daily 06/15/19 0027 06/19/19 1100   06/15/19 1000  remdesivir 100 mg in sodium chloride 0.9 % 100 mL IVPB  Status:  Discontinued     100 mg 200 mL/hr over 30 Minutes Intravenous Daily 06/15/19  0023 06/15/19 0025   06/15/19 0030  remdesivir 200 mg in sodium chloride 0.9% 250 mL IVPB  Status:  Discontinued     200 mg 580 mL/hr over 30 Minutes Intravenous Once 06/15/19 0023 06/15/19 0025   06/15/19 0030  remdesivir 100 mg in sodium chloride 0.9 % 100 mL IVPB     100 mg 200 mL/hr over 30 Minutes Intravenous Every 30 min 06/15/19 0026 06/15/19 0218       Time Spent in minutes  95   Phillips Climes M.D on 06/22/2019 at 2:48 PM  To page go to [email or web address] - password [facility]  [facility] -  Office  [phone]  See all Orders from today for further details    Objective:   Vitals:   06/22/19 0520 06/22/19 0918 06/22/19 1311 06/22/19 1400  BP:  (!) 140/91 123/80 115/72  Pulse:  94 78 90  Resp: 20 18 15  (!) 24  Temp:   97.9 F (36.6 C) 98.1 F (36.7 C)  TempSrc:   Oral Oral  SpO2: 98% 90% 91% (!) 87%  Weight:      Height:        Wt Readings from Last 3 Encounters:  06/19/19 108.8 kg  02/19/19 108.6 kg  12/24/18 107.5 kg     Intake/Output Summary (Last 24 hours) at 06/22/2019 1448 Last data filed at 06/22/2019 1408 Gross per 24 hour  Intake 480 ml  Output 200 ml  Net 280 ml     Physical Exam  Awake Alert, Oriented X 3, No new F.N deficits, Normal affect Symmetrical Chest wall movement, Good air movement bilaterally, rales at bases. RRR,No Gallops,Rubs or new Murmurs, No Parasternal Heave +ve B.Sounds, Abd Soft, No tenderness, No rebound - guarding or rigidity. No Cyanosis, Clubbing or edema, No new Rash or bruise       Data Review:    CBC Recent Labs  Lab 06/16/19 0405 06/16/19 1428 06/17/19 0335 06/17/19 0335 06/18/19 0259 06/19/19 0240 06/20/19 0316 06/21/19 0722 06/22/19 0239  WBC 9.2   < > 7.9   < > 8.9 11.3* 11.1* 13.4* 12.8*  HGB 12.5   < > 12.2   < > 12.1 13.9 14.4 15.4* 15.0  HCT 36.0   < > 35.4*   < > 35.8* 40.0 41.1 44.0 42.0  PLT PLATELET CLUMPS NOTED ON SMEAR, UNABLE TO ESTIMATE   < > 121*   < > PLATELET CLUMPS NOTED ON  SMEAR, UNABLE TO ESTIMATE 181 240 337 314  MCV 96.8   < > 96.5   < > 98.6 97.1 95.4 96.9 94.8  MCH 33.6   < > 33.2   < > 33.3 33.7 33.4 33.9 33.9  MCHC 34.7   < > 34.5   < > 33.8 34.8 35.0 35.0 35.7  RDW 11.9   < > 11.9   < > 12.0 12.0 11.9 12.2  12.0  LYMPHSABS 0.7  --  0.5*  --  0.7 0.6*  --   --   --   MONOABS 2.3*  --  1.5*  --  2.0* 1.2*  --   --   --   EOSABS 0.0  --  0.0  --  0.0 0.0  --   --   --   BASOSABS 0.0  --  0.0  --  0.0 0.0  --   --   --    < > = values in this interval not displayed.    Chemistries  Recent Labs  Lab 06/16/19 0405 06/16/19 0405 06/17/19 0335 06/17/19 0335 06/18/19 0259 06/19/19 0240 06/20/19 0316 06/21/19 0722 06/22/19 0239  NA 134*   < > 136   < > 137 139 139 139 137  K 3.6   < > 3.4*   < > 3.8 3.5 3.0* 4.7 4.0  CL 95*   < > 100   < > 101 97* 96* 99 99  CO2 26   < > 24   < > 24 27 27 26 27   GLUCOSE 132*   < > 130*   < > 129* 160* 135* 150* 137*  BUN 25*   < > 20   < > 20 23 31* 43* 42*  CREATININE 1.05*   < > 0.95   < > 0.82 1.02* 1.13* 1.31* 1.17*  CALCIUM 8.4*   < > 8.2*   < > 8.0* 8.0* 7.8* 8.2* 8.6*  MG 1.8  --  1.6*  --  2.2 1.8  --  2.1  --   AST 44*   < > 53*   < > 41 44* 42* 53* 88*  ALT 27   < > 30   < > 25 30 28  45* 75*  ALKPHOS 80   < > 82   < > 79 89 89 95 113  BILITOT 1.2   < > 1.5*   < > 1.1 1.2 1.1 1.6* 1.7*   < > = values in this interval not displayed.   ------------------------------------------------------------------------------------------------------------------ No results for input(s): CHOL, HDL, LDLCALC, TRIG, CHOLHDL, LDLDIRECT in the last 72 hours.  Lab Results  Component Value Date   HGBA1C 5.8 08/17/2014   ------------------------------------------------------------------------------------------------------------------ Recent Labs    06/20/19 0316  TSH 0.781    Cardiac Enzymes No results for input(s): CKMB, TROPONINI, MYOGLOBIN in the last 168 hours.  Invalid input(s):  CK ------------------------------------------------------------------------------------------------------------------    Component Value Date/Time   BNP 62.5 06/21/2019 4166    Micro Results Recent Results (from the past 240 hour(s))  Blood Culture (routine x 2)     Status: None   Collection Time: 07/05/2019  9:40 PM   Specimen: BLOOD  Result Value Ref Range Status   Specimen Description BLOOD RIGHT ANTECUBITAL  Final   Special Requests   Final    BOTTLES DRAWN AEROBIC AND ANAEROBIC Blood Culture adequate volume   Culture   Final    NO GROWTH 5 DAYS Performed at [facility], [address] [place], [place] [address]    Report Status 06/19/2019 FINAL  Final  Blood Culture (routine x 2)     Status: None   Collection Time: 06/24/2019  9:53 PM   Specimen: BLOOD  Result Value Ref Range Status   Specimen Description BLOOD LEFT ANTECUBITAL  Final   Special Requests   Final    BOTTLES DRAWN AEROBIC AND ANAEROBIC Blood Culture adequate volume  Culture   Final    NO GROWTH 5 DAYS Performed at [facility], [place] [address], [place], [place] [address]    Report Status 06/19/2019 FINAL  Final    Radiology Reports DG Chest Port 1 View  Result Date: 06/20/2019 CLINICAL DATA:  Acute hypoxemic respiratory failure due to COVID-19. EXAM: PORTABLE CHEST 1 VIEW COMPARISON:  06/18/2019 FINDINGS: Hazy bilateral airspace lung opacities are noted in the central and lower lungs consistent with multifocal pneumonia. No convincing pleural effusion.  No pneumothorax. Stable changes from prior CABG surgery. IMPRESSION: 1. Since the previous exam, hazy bilateral airspace lung opacities have developed in the central and lower lungs consistent with multifocal COVID-19 pneumonia. Electronically Signed   By: Lajean Manes M.D.   On: 06/20/2019 09:17   DG Chest Port 1 View  Result Date: 06/18/2019 CLINICAL DATA:  Hypoxia, COVID-19 positive EXAM: PORTABLE CHEST 1 VIEW COMPARISON:  06/29/2019 chest  radiograph. FINDINGS: Intact sternotomy wires. Stable cardiomediastinal silhouette with mild cardiomegaly. No pneumothorax. No pleural effusion. Lungs appear clear, with no acute consolidative airspace disease and no pulmonary edema. IMPRESSION: Stable mild cardiomegaly without pulmonary edema. No active pulmonary disease. Electronically Signed   By: Ilona Sorrel M.D.   On: 06/18/2019 11:33   DG Chest Port 1 View  Result Date: 06/19/2019 CLINICAL DATA:  74 year old female with shortness of breath, hypoxia. EXAM: PORTABLE CHEST 1 VIEW COMPARISON:  Chest CTA 09/24/2017 and earlier. FINDINGS: Portable AP view at 2119 hours. Mildly lower lung volumes. Sequelae of CABG. Stable cardiac size and mediastinal contours. Stable thyroidectomy surgical clips. Increased crowding of lung markings at both bases. Allowing for portable technique the lungs are otherwise clear. No pneumothorax. No acute osseous abnormality identified. IMPRESSION: Lower lung volumes with mild bibasilar atelectasis. Electronically Signed   By: Genevie Ann M.D.   On: 06/25/2019 21:32

## 2019-06-22 NOTE — Progress Notes (Addendum)
Physical Therapy Treatment Patient Details Name: Breanna Hernandez MRN: [id number] DOB: 1945-04-30 Today's Date: 06/22/2019    History of Present Illness 74 year old female admitted 06/18/2019 with fevers, chills, cough and admitted with respiratory failure and gastroenteritis due to COVID requiring supplmental oxygen. PMH: CAD s/p CABG, atrial tachycardia, aortic dissection, breast cancer, emdometrial cancer, thyroid cancer     PT Comments    Patient on 9L HFNC today. Upon PT entrance to room, patient with poor positioning in chair, oxygen saturation 83%, patient noted incontinence of urine despite Purwick. Nurse tech and PT assisted patient with care. Oxygen saturation down to 73-76% with standing approx 20 seconds x 2 trials with RW, seated rest breaks inbetween. She takes several minutes to recover to mid 80s%. Pending patient's medical course and mobility progression, she may require discharge to SNF for short term rehabilitation as patient currently requires assist for transfers. Patient also notes during session that her husband has COVID and is getting outpatient infusions.   Follow Up Recommendations  SNF     Equipment Recommendations  3in1 (PT)       Precautions / Restrictions Precautions Precautions: Fall;Other (comment) Precaution Comments: monitor oxygen saturation Restrictions Weight Bearing Restrictions: No    Mobility  Bed Mobility    General bed mobility comments: Patient already OOB in chair  Transfers Overall transfer level: Needs assistance Equipment used: Rolling walker (2 wheeled) Transfers: Sit to/from Stand Sit to Stand: Min assist         General transfer comment: sit<>stand x 2 trials with minA and RW.   Ambulation/Gait    General Gait Details: Patient would be unable to tolerate due to decreased standing tolerance         Balance   Standing balance support: Bilateral upper extremity supported Standing balance-Leahy Scale: (Fair-) Standing  balance comment: Static standing balance with RW with contact guard/close supervision. Standing tolerance approx 20 seconds each trial. Oxygen saturation drops to 73-76% with standing. Seated recovery x a few minutes to get to 81-83% (patient's baseline at rest at start of session)     Cognition Arousal/Alertness: Awake/alert   General Comments: oriented x 3      Exercises Other Exercises Other Exercises: incentive spirometer x 10, max 600, education and cues for improved technique and purpose of incentive spirometer/goals Other Exercises: Control and instrumentation engineer to order new flutter as patient's is lost)    General Comments General comments (skin integrity, edema, etc.): Patient on 9L HFNC. Upon PT entrance to room, patient with poor positioning in chair, oxygen saturation 83%, patient noted incontinence of urine despite Purwick. Oxygen saturation down to 73-76% with standing approx 20 seconds x 2 trials with RW. She takes several minutes to recover to mid 80s%. HR 98 bpm, RR 19 at end of session      Pertinent Vitals/Pain Pain Assessment: No/denies pain(no complaints of pain, no signs/symptoms of pain)           PT Goals (current goals can now be found in the care plan section) Progress towards PT goals: Not progressing toward goals - comment(decreased activity tolerance)    Frequency    Min 2X/week      PT Plan Discharge plan needs to be updated       AM-PAC PT "6 Clicks" Mobility   Outcome Measure  Help needed turning from your back to your side while in a flat bed without using bedrails?: A Little Help needed moving from lying on your back to sitting on the side of  a flat bed without using bedrails?: A Little Help needed moving to and from a bed to a chair (including a wheelchair)?: A Little Help needed standing up from a chair using your arms (e.g., wheelchair or bedside chair)?: A Little Help needed to walk in hospital room?: A Lot Help needed climbing 3-5 steps with a  railing? : Total 6 Click Score: 15    End of Session Equipment Utilized During Treatment: Oxygen Activity Tolerance: Patient limited by fatigue Patient left: in chair;with call bell/phone within reach Nurse Communication: (oxygen saturation response to activity) PT Visit Diagnosis: Unsteadiness on feet (R26.81);Other abnormalities of gait and mobility (R26.89)     Time: 1440-1515 PT Time Calculation (min) (ACUTE ONLY): 35 min  Charges:  $Therapeutic Activity: 23-37 mins                     Birdie Hopes, PT, DPT Acute Rehab [phone] office      Birdie Hopes 06/22/2019, 3:47 PM

## 2019-06-23 ENCOUNTER — Inpatient Hospital Stay (HOSPITAL_COMMUNITY): Payer: Medicare Other

## 2019-06-23 LAB — CBC
HCT: 40.5 % (ref 36.0–46.0)
Hemoglobin: 14.5 g/dL (ref 12.0–15.0)
MCH: 34.3 pg — ABNORMAL HIGH (ref 26.0–34.0)
MCHC: 35.8 g/dL (ref 30.0–36.0)
MCV: 95.7 fL (ref 80.0–100.0)
Platelets: 288 10*3/uL (ref 150–400)
RBC: 4.23 MIL/uL (ref 3.87–5.11)
RDW: 12.3 % (ref 11.5–15.5)
WBC: 14.5 10*3/uL — ABNORMAL HIGH (ref 4.0–10.5)
nRBC: 0.1 % (ref 0.0–0.2)

## 2019-06-23 LAB — COMPREHENSIVE METABOLIC PANEL
ALT: 61 U/L — ABNORMAL HIGH (ref 0–44)
AST: 51 U/L — ABNORMAL HIGH (ref 15–41)
Albumin: 3 g/dL — ABNORMAL LOW (ref 3.5–5.0)
Alkaline Phosphatase: 95 U/L (ref 38–126)
Anion gap: 12 (ref 5–15)
BUN: 38 mg/dL — ABNORMAL HIGH (ref 8–23)
CO2: 27 mmol/L (ref 22–32)
Calcium: 8.9 mg/dL (ref 8.9–10.3)
Chloride: 97 mmol/L — ABNORMAL LOW (ref 98–111)
Creatinine, Ser: 1.12 mg/dL — ABNORMAL HIGH (ref 0.44–1.00)
GFR calc Af Amer: 56 mL/min — ABNORMAL LOW (ref >60)
GFR calc non Af Amer: 48 mL/min — ABNORMAL LOW (ref >60)
Glucose, Bld: 145 mg/dL — ABNORMAL HIGH (ref 70–99)
Potassium: 3.8 mmol/L (ref 3.5–5.1)
Sodium: 136 mmol/L (ref 135–145)
Total Bilirubin: 1.6 mg/dL — ABNORMAL HIGH (ref 0.3–1.2)
Total Protein: 6.6 g/dL (ref 6.5–8.1)

## 2019-06-23 LAB — C-REACTIVE PROTEIN: CRP: 0.6 mg/dL (ref <1.0)

## 2019-06-23 MED ORDER — MAGIC MOUTHWASH
5.0000 mL | Freq: Four times a day (QID) | ORAL | Status: DC
  Administered 2019-06-23 – 2019-06-25 (×8): 5 mL via ORAL
  Filled 2019-06-23 (×11): qty 5

## 2019-06-23 MED ORDER — IPRATROPIUM-ALBUTEROL 20-100 MCG/ACT IN AERS
1.0000 | INHALATION_SPRAY | Freq: Three times a day (TID) | RESPIRATORY_TRACT | Status: DC
  Administered 2019-06-24 – 2019-06-25 (×6): 1 via RESPIRATORY_TRACT
  Filled 2019-06-23 (×2): qty 4

## 2019-06-23 MED ORDER — FUROSEMIDE 10 MG/ML IJ SOLN
60.0000 mg | Freq: Four times a day (QID) | INTRAMUSCULAR | Status: DC
  Administered 2019-06-23: 60 mg via INTRAVENOUS
  Filled 2019-06-23: qty 6

## 2019-06-23 NOTE — Progress Notes (Signed)
                                  PROGRESS NOTE                                                                                                                                                                                                             Patient Demographics:    Breanna Hernandez, is a 74 y.o. female, DOB - 10/10/1945, MRN:[id number]  Outpatient Primary MD for the patient is Breanna Chase, Yvonne R, DO    LOS - 9  Admit date - 06/19/2019    Chief Complaint  Patient presents with  . Weakness  . Palpitations       Brief Narrative    - Breanna Hernandez is a 74 y.o. female with medical history significant for CAD s/p CABG, atrial tachycardia, history of aortic dissection, history of breast cancer, history of endometrial cancer, history of thyroid cancer with postoperative hypothyroidism, hypertension, hyperlipidemia, and depression/anxiety who presents to the ED for evaluation of fevers, chills, cough, he was diagnosed with acute hypoxic respiratory failure due to COVID-19 pneumonia and admitted to the hospital.   Subjective:   Patient reports she is feeling more dyspneic today, some generalized weakness, denies any nausea, fever or chest pain .   Assessment  & Plan :     Acute Hypoxic Resp. Failure due to Acute Covid 19 Viral Pneumonitis during the ongoing 2020 Covid 19 Pandemic  - she has severe disease, 74 she remains on significant oxygen requirement today, she had transient improvement yesterday which did require 9 L oxygen high flow nasal cannula, but this morning she is again on NRB and high flow nasal cannula . -Continue with IV steroids.  Tomorrow is day 10, likely will need to extend for another few days given profound hypoxia. -Treated with total of 5 days of IV remdesivir. -Received Actemra 3/11. -She is on IV diuresis as needed, will give 60 mg of IV Lasix today . -Repeat chest x-ray today given worsening hypoxia . -Encouraged the patient  to sit up in chair in the daytime use I-S and flutter valve for pulmonary toiletry and then prone in bed when at night.  SpO2: 93 % O2 Flow Rate (L/min): 13 L/min  Recent Labs  Lab 06/17/19 0335 06/17/19 0335 06/18/19 0259 06/18/19 0259 06/19/19 0240 06/20/19 0316 06/21/19 0722 06/22/19 0239 06/23/19 0121  CRP 4.9*   < > 5.9*   < >   3.9* 2.7* 1.4* 0.8 0.6  DDIMER 2.29*  --  1.80*  --  2.24* 2.21*  --   --   --   BNP 88.3  --  935.0*  --  100.5*  --  62.5  --   --   PROCALCITON <0.10  --  0.12  --  0.12  --   --   --   --    < > = values in this interval not displayed.    Hepatic Function Latest Ref Rng & Units 06/23/2019 06/22/2019 06/21/2019  Total Protein 6.5 - 8.1 g/dL 6.6 7.0 7.1  Albumin 3.5 - 5.0 g/dL 3.0(L) 3.2(L) 3.3(L)  AST 15 - 41 U/L 51(H) 88(H) 53(H)  ALT 0 - 44 U/L 61(H) 75(H) 45(H)  Alk Phosphatase 38 - 126 U/L 95 113 95  Total Bilirubin 0.3 - 1.2 mg/dL 1.6(H) 1.7(H) 1.6(H)  Bilirubin, Direct 0.0 - 0.3 mg/dL - - -    Mild COVID-19 viral infection related transaminitis.  -  Asymptomatic, trend.  A. fib with RVR/a flutter -Rhythm continues to be alternating between A. fib with RVR, and a flutter, peers to be more controlled currently. -TSH within normal limits. -Rate seems to be controlled metoprolol 50 mg p.o. twice daily . -She is on low-dose Eliquis 2.5 mg p.o. twice daily,  transitioned to full dose Lovenox.  Dyslipidemia.   -On statin  CAD  - CAGB - no chest pain, continue on Lopressor, rosuvastatin, holding ASA due to low platelets.  Hypothyroidism.   -On Synthroid continue.  TSH within normal limits  Anxiety and depression.   -Continue combination of sertraline and as needed Ativan.  Dehydration, generalized weakness, AKI and hypotension.  Blood pressure medications are adjusted, ARB discontinued,  Hypokalemia -Repleted  Thrombocytopenia.   - Question if this is due to viral illness, this has resolved currently  Lab Results  Component  Value Date   PLT 288 06/23/2019      Condition - Extremely Guarded  Family Communication  : Daughter kristin has been updated daily, 16  Code Status :  Full  Diet :   Diet Order            Diet Heart Room service appropriate? Yes; Fluid consistency: Thin  Diet effective now               Disposition Plan  : Disposition plan is unclear yet, given she is still critically ill and with significant oxygen requirement..  Consults  : None  Procedures  : None  PUD Prophylaxis : PPI  DVT Prophylaxis  : Eliquis >> lovenox  Lab Results  Component Value Date   PLT 288 06/23/2019    Inpatient Medications  Scheduled Meds: . vitamin C  500 mg Oral Daily  . dexamethasone (DECADRON) injection  6 mg Intravenous Q24H  . docusate sodium  200 mg Oral BID  . enoxaparin (LOVENOX) injection  110 mg Subcutaneous Q12H  . feeding supplement (ENSURE ENLIVE)  237 mL Oral TID BM  . furosemide  60 mg Intravenous Q6H  . guaiFENesin  1,200 mg Oral BID  . Ipratropium-Albuterol  1 puff Inhalation Q6H  . levothyroxine  175 mcg Oral Q0600  . mouth rinse  15 mL Mouth Rinse BID  . metoprolol tartrate  50 mg Oral BID  . pantoprazole  40 mg Oral Daily  . rosuvastatin  10 mg Oral QHS  . sertraline  50 mg Oral QHS  . traMADol  50 mg Oral Once  .   zinc sulfate  220 mg Oral Daily   Continuous Infusions:  PRN Meds:.acetaminophen, albuterol, chlorpheniramine-HYDROcodone, guaiFENesin-dextromethorphan, LORazepam, ondansetron **OR** ondansetron (ZOFRAN) IV, phenol  Antibiotics  :    Anti-infectives (From admission, onward)   Start     Dose/Rate Route Frequency Ordered Stop   06/16/19 1000  remdesivir 100 mg in sodium chloride 0.9 % 100 mL IVPB     100 mg 200 mL/hr over 30 Minutes Intravenous Daily 06/15/19 0027 06/19/19 1100   06/15/19 1000  remdesivir 100 mg in sodium chloride 0.9 % 100 mL IVPB  Status:  Discontinued     100 mg 200 mL/hr over 30 Minutes Intravenous Daily 06/15/19 0023 06/15/19  0025   06/15/19 0030  remdesivir 200 mg in sodium chloride 0.9% 250 mL IVPB  Status:  Discontinued     200 mg 580 mL/hr over 30 Minutes Intravenous Once 06/15/19 0023 06/15/19 0025   06/15/19 0030  remdesivir 100 mg in sodium chloride 0.9 % 100 mL IVPB     100 mg 200 mL/hr over 30 Minutes Intravenous Every 30 min 06/15/19 0026 06/15/19 0218       Time Spent in minutes  30   Dawood Elgergawy M.D on 06/23/2019 at 1:17 PM  To page go to [email or web address] - password [email or web address]  [facility] -  Office  [phone]  See all Orders from today for further details    Objective:   Vitals:   06/23/19 0948 06/23/19 1020 06/23/19 1026 06/23/19 1307  BP: 127/63   111/75  Pulse: 74   (!) 104  Resp:    20  Temp:    97.7 F (36.5 C)  TempSrc:    Oral  SpO2:  94% 92% 93%  Weight:      Height:        Wt Readings from Last 3 Encounters:  06/23/19 107.2 kg  02/19/19 108.6 kg  12/24/18 107.5 kg     Intake/Output Summary (Last 24 hours) at 06/23/2019 1317 Last data filed at 06/23/2019 1009 Gross per 24 hour  Intake 840 ml  Output 2025 ml  Net -1185 ml     Physical Exam  Awake Alert, Oriented X 3, No new F.N deficits, Normal affect Symmetrical Chest wall movement, Good air movement bilaterally, rales at the bases. RRR,No Gallops,Rubs or new Murmurs, No Parasternal Heave +ve B.Sounds, Abd Soft, No tenderness, No rebound - guarding or rigidity. No Cyanosis, Clubbing or edema, No new Rash or bruise       Data Review:    CBC Recent Labs  Lab 06/17/19 0335 06/17/19 0335 06/18/19 0259 06/18/19 0259 06/19/19 0240 06/20/19 0316 06/21/19 0722 06/22/19 0239 06/23/19 0121  WBC 7.9   < > 8.9   < > 11.3* 11.1* 13.4* 12.8* 14.5*  HGB 12.2   < > 12.1   < > 13.9 14.4 15.4* 15.0 14.5  HCT 35.4*   < > 35.8*   < > 40.0 41.1 44.0 42.0 40.5  PLT 121*   < > PLATELET CLUMPS NOTED ON SMEAR, UNABLE TO ESTIMATE   < > 181 240 337 314 288  MCV 96.5   < > 98.6   < > 97.1 95.4 96.9 94.8 95.7    MCH 33.2   < > 33.3   < > 33.7 33.4 33.9 33.9 34.3*  MCHC 34.5   < > 33.8   < > 34.8 35.0 35.0 35.7 35.8  RDW 11.9   < > 12.0   < > 12.0 11.9 12.2 12.0 12.3    LYMPHSABS 0.5*  --  0.7  --  0.6*  --   --   --   --   MONOABS 1.5*  --  2.0*  --  1.2*  --   --   --   --   EOSABS 0.0  --  0.0  --  0.0  --   --   --   --   BASOSABS 0.0  --  0.0  --  0.0  --   --   --   --    < > = values in this interval not displayed.    Chemistries  Recent Labs  Lab 06/17/19 0335 06/17/19 0335 06/18/19 0259 06/18/19 0259 06/19/19 0240 06/20/19 0316 06/21/19 0722 06/22/19 0239 06/23/19 0121  NA 136   < > 137   < > 139 139 139 137 136  K 3.4*   < > 3.8   < > 3.5 3.0* 4.7 4.0 3.8  CL 100   < > 101   < > 97* 96* 99 99 97*  CO2 24   < > 24   < > 27 27 26 27 27  GLUCOSE 130*   < > 129*   < > 160* 135* 150* 137* 145*  BUN 20   < > 20   < > 23 31* 43* 42* 38*  CREATININE 0.95   < > 0.82   < > 1.02* 1.13* 1.31* 1.17* 1.12*  CALCIUM 8.2*   < > 8.0*   < > 8.0* 7.8* 8.2* 8.6* 8.9  MG 1.6*  --  2.2  --  1.8  --  2.1  --   --   AST 53*   < > 41   < > 44* 42* 53* 88* 51*  ALT 30   < > 25   < > 30 28 45* 75* 61*  ALKPHOS 82   < > 79   < > 89 89 95 113 95  BILITOT 1.5*   < > 1.1   < > 1.2 1.1 1.6* 1.7* 1.6*   < > = values in this interval not displayed.   ------------------------------------------------------------------------------------------------------------------ No results for input(s): CHOL, HDL, LDLCALC, TRIG, CHOLHDL, LDLDIRECT in the last 72 hours.  Lab Results  Component Value Date   HGBA1C 5.8 08/17/2014   ------------------------------------------------------------------------------------------------------------------ No results for input(s): TSH, T4TOTAL, T3FREE, THYROIDAB in the last 72 hours.  Invalid input(s): FREET3  Cardiac Enzymes No results for input(s): CKMB, TROPONINI, MYOGLOBIN in the last 168 hours.  Invalid input(s):  CK ------------------------------------------------------------------------------------------------------------------    Component Value Date/Time   BNP 62.5 06/21/2019 0722    Micro Results Recent Results (from the past 240 hour(s))  Blood Culture (routine x 2)     Status: None   Collection Time: 07/04/2019  9:40 PM   Specimen: BLOOD  Result Value Ref Range Status   Specimen Description BLOOD RIGHT ANTECUBITAL  Final   Special Requests   Final    BOTTLES DRAWN AEROBIC AND ANAEROBIC Blood Culture adequate volume   Culture   Final    NO GROWTH 5 DAYS Performed at [facility], [address] [place], [place] [address]    Report Status 06/19/2019 FINAL  Final  Blood Culture (routine x 2)     Status: None   Collection Time: 06/22/2019  9:53 PM   Specimen: BLOOD  Result Value Ref Range Status   Specimen Description BLOOD LEFT ANTECUBITAL  Final   Special Requests   Final      BOTTLES DRAWN AEROBIC AND ANAEROBIC Blood Culture adequate volume   Culture   Final    NO GROWTH 5 DAYS Performed at [facility], [place] [address] [place], [place] [address]    Report Status 06/19/2019 FINAL  Final    Radiology Reports DG Chest Port 1 View  Result Date: 06/20/2019 CLINICAL DATA:  Acute hypoxemic respiratory failure due to COVID-19. EXAM: PORTABLE CHEST 1 VIEW COMPARISON:  06/18/2019 FINDINGS: Hazy bilateral airspace lung opacities are noted in the central and lower lungs consistent with multifocal pneumonia. No convincing pleural effusion.  No pneumothorax. Stable changes from prior CABG surgery. IMPRESSION: 1. Since the previous exam, hazy bilateral airspace lung opacities have developed in the central and lower lungs consistent with multifocal COVID-19 pneumonia. Electronically Signed   By: Lajean Manes M.D.   On: 06/20/2019 09:17   DG Chest Port 1 View  Result Date: 06/18/2019 CLINICAL DATA:  Hypoxia, COVID-19 positive EXAM: PORTABLE CHEST 1 VIEW COMPARISON:  07/04/2019 chest  radiograph. FINDINGS: Intact sternotomy wires. Stable cardiomediastinal silhouette with mild cardiomegaly. No pneumothorax. No pleural effusion. Lungs appear clear, with no acute consolidative airspace disease and no pulmonary edema. IMPRESSION: Stable mild cardiomegaly without pulmonary edema. No active pulmonary disease. Electronically Signed   By: Ilona Sorrel M.D.   On: 06/18/2019 11:33   DG Chest Port 1 View  Result Date: 07/01/2019 CLINICAL DATA:  74 year old female with shortness of breath, hypoxia. EXAM: PORTABLE CHEST 1 VIEW COMPARISON:  Chest CTA 09/24/2017 and earlier. FINDINGS: Portable AP view at 2119 hours. Mildly lower lung volumes. Sequelae of CABG. Stable cardiac size and mediastinal contours. Stable thyroidectomy surgical clips. Increased crowding of lung markings at both bases. Allowing for portable technique the lungs are otherwise clear. No pneumothorax. No acute osseous abnormality identified. IMPRESSION: Lower lung volumes with mild bibasilar atelectasis. Electronically Signed   By: Genevie Ann M.D.   On: 06/17/2019 21:32

## 2019-06-23 NOTE — Progress Notes (Addendum)
Patient in chair, oxygen saturation 95% on 11 HFNC and NRB mask. Pt alert and oriented, no complaints of SOB or discomfort. Pt educated on flutter valve and incentive spirometry use, and deep breathing exercise.

## 2019-06-23 NOTE — Progress Notes (Addendum)
OT Cancellation Note  Patient Details Name: Breanna Hernandez MRN: [id number] DOB: 1945/05/12   Cancelled Treatment:    Reason Eval/Treat Not Completed: Fatigue/lethargy limiting ability to participate;Medical issues which prohibited therapy. Pt with chest Xray ordered due to elevated HR and appearance of worsening medical condition. Per medical team, pt with excessive fatigue that will impact ability to participate, as well. Will re-attempt session tomorrow.  Layla Maw 06/23/2019, 3:31 PM

## 2019-06-23 NOTE — Progress Notes (Signed)
We attempted weaning  Pt off the high flow, but she quickly de-sats to mid and low 80s.  Pt  is back on 12L of High flow and maxed on none-rebreather

## 2019-06-23 NOTE — Progress Notes (Signed)
Patient was desat at 55 in HfNC 10 lit. put on her Non rebreather Mask. Patient is stable this time.

## 2019-06-24 ENCOUNTER — Other Ambulatory Visit (HOSPITAL_COMMUNITY): Payer: Medicare Other

## 2019-06-24 DIAGNOSIS — R0902 Hypoxemia: Secondary | ICD-10-CM

## 2019-06-24 DIAGNOSIS — F419 Anxiety disorder, unspecified: Secondary | ICD-10-CM

## 2019-06-24 DIAGNOSIS — F329 Major depressive disorder, single episode, unspecified: Secondary | ICD-10-CM

## 2019-06-24 LAB — COMPREHENSIVE METABOLIC PANEL
ALT: 71 U/L — ABNORMAL HIGH (ref 0–44)
AST: 71 U/L — ABNORMAL HIGH (ref 15–41)
Albumin: 3 g/dL — ABNORMAL LOW (ref 3.5–5.0)
Alkaline Phosphatase: 107 U/L (ref 38–126)
Anion gap: 15 (ref 5–15)
BUN: 42 mg/dL — ABNORMAL HIGH (ref 8–23)
CO2: 26 mmol/L (ref 22–32)
Calcium: 9.3 mg/dL (ref 8.9–10.3)
Chloride: 97 mmol/L — ABNORMAL LOW (ref 98–111)
Creatinine, Ser: 1.3 mg/dL — ABNORMAL HIGH (ref 0.44–1.00)
GFR calc Af Amer: 46 mL/min — ABNORMAL LOW (ref >60)
GFR calc non Af Amer: 40 mL/min — ABNORMAL LOW (ref >60)
Glucose, Bld: 136 mg/dL — ABNORMAL HIGH (ref 70–99)
Potassium: 3.6 mmol/L (ref 3.5–5.1)
Sodium: 138 mmol/L (ref 135–145)
Total Bilirubin: 2.2 mg/dL — ABNORMAL HIGH (ref 0.3–1.2)
Total Protein: 6.4 g/dL — ABNORMAL LOW (ref 6.5–8.1)

## 2019-06-24 LAB — CBC
HCT: 42 % (ref 36.0–46.0)
Hemoglobin: 15 g/dL (ref 12.0–15.0)
MCH: 33.6 pg (ref 26.0–34.0)
MCHC: 35.7 g/dL (ref 30.0–36.0)
MCV: 94.2 fL (ref 80.0–100.0)
Platelets: 272 10*3/uL (ref 150–400)
RBC: 4.46 MIL/uL (ref 3.87–5.11)
RDW: 12.3 % (ref 11.5–15.5)
WBC: 14.2 10*3/uL — ABNORMAL HIGH (ref 4.0–10.5)
nRBC: 0.4 % — ABNORMAL HIGH (ref 0.0–0.2)

## 2019-06-24 LAB — BRAIN NATRIURETIC PEPTIDE: B Natriuretic Peptide: 73 pg/mL (ref 0.0–100.0)

## 2019-06-24 NOTE — Progress Notes (Signed)
Patient tolerated a total of six hours in the chair with HFNC 11 and NRB. Patient oxygen saturation range 89-90%, . Patient continues the use of flutter valve and Inceptive spirometry during the day. Will encourage to prone and turn in bed.

## 2019-06-24 NOTE — Progress Notes (Addendum)
Occupational Therapy Treatment Patient Details Name: Breanna Hernandez MRN: [id number] DOB: 1945-07-17 Today's Date: 06/24/2019    History of present illness 74 year old female admitted 06/16/2019 with fevers, chills, cough and admitted with respiratory failure and gastroenteritis due to COVID requiring supplmental oxygen. PMH: CAD s/p CABG, atrial tachycardia, aortic dissection, breast cancer, emdometrial cancer, thyroid cancer    OT comments  Pt with increasing O2 needs, received on 12 L HFNC. Session based around assessing response to exertional activities, recovery time, and pt implementation of energy conservation strategies. On 12 L HFNC, pt stats low 80s at rest. With 15 L NRB mask, pt increased quickly to low 90s. Guided pt in sit to stand trials to assess cardiopulmonary endurance. Pt min guard for this task using RW.  Increased to 15 L HFNC for activity, desat to 76%. required at least 5 min seated rest break to recover to 85% on 15 L HFNC. OT provided cues for pursed lip breathing and relaxation strategies. Again, quickly recovered to low 90s with NRB mask. Once calm and at rest, reduced to initial 12 L HFNC with stats remaining in low-mid 80s. Highest HR noted at 115bpm. Pt reports sitting in recliner chair frequently and using flutter valve/IS every hour. Pt motivated to participate in therapy. Based on current functional level and increasing O2 needs, revise recommendation to SNF for short term rehab to increase independence prior to return home. Will continue to follow acutely.   Follow Up Recommendations  SNF;Supervision/Assistance - 24 hour    Equipment Recommendations  3 in 1 bedside commode    Recommendations for Other Services      Precautions / Restrictions Precautions Precautions: Fall;Other (comment)(airborne) Precaution Comments: monitor oxygen saturation Restrictions Weight Bearing Restrictions: No       Mobility Bed Mobility               General bed mobility  comments: Patient already OOB in chair  Transfers Overall transfer level: Needs assistance Equipment used: Rolling walker (2 wheeled) Transfers: Sit to/from Stand Sit to Stand: Min guard         General transfer comment: sit to stand for 15 seconds with quick desat    Balance Overall balance assessment: Needs assistance Sitting-balance support: Feet supported Sitting balance-Leahy Scale: Good     Standing balance support: No upper extremity supported Standing balance-Leahy Scale: Fair Standing balance comment: min guard to ensure safety with static standing balance. Pt stood for 15 sec on 15 L HFNC, desat to 76%. Required increased time for seated rest break to recover to mid 80s                           ADL either performed or assessed with clinical judgement   ADL Overall ADL's : Needs assistance/impaired                                             Vision       Perception     Praxis      Cognition Arousal/Alertness: Awake/alert Behavior During Therapy: WFL for tasks assessed/performed Overall Cognitive Status: Within Functional Limits for tasks assessed  Exercises Exercises: Other exercises Other Exercises Other Exercises: pursed lip breathing   Shoulder Instructions       General Comments Pt received on 12 L HFNC, low 80s at rest. With 15 L NRB mask, pt increased quickly to low 90s. Increased to 15 L HFNC for activity, desat to 76%. required at least 5 min seated rest break to recover to 85% on 15 L HFNC. Again, quickly recovered to 90s with NRB mask. Once calm and at rest, reduced to initial 12 L HFNC.    Pertinent Vitals/ Pain       Pain Assessment: No/denies pain  Home Living                                          Prior Functioning/Environment              Frequency  Min 2X/week        Progress Toward Goals  OT Goals(current  goals can now be found in the care plan section)  Progress towards OT goals: Progressing toward goals  Acute Rehab OT Goals Patient Stated Goal: feel well enough to go home  OT Goal Formulation: With patient Time For Goal Achievement: 06/30/19 Potential to Achieve Goals: Good ADL Goals Pt Will Perform Grooming: with modified independence;standing Pt Will Perform Lower Body Bathing: with modified independence;sitting/lateral leans;sit to/from stand Pt Will Perform Lower Body Dressing: with modified independence;sit to/from stand;sitting/lateral leans Pt Will Transfer to Toilet: with supervision;ambulating;regular height toilet Pt Will Perform Toileting - Clothing Manipulation and hygiene: with modified independence;sit to/from stand Additional ADL Goal #1: Pt will verbalize at least 3 energy conservation strategies in order to maximize ADL independence.  Plan Discharge plan needs to be updated    Co-evaluation                 AM-PAC OT "6 Clicks" Daily Activity     Outcome Measure   Help from another person eating meals?: None Help from another person taking care of personal grooming?: A Little Help from another person toileting, which includes using toliet, bedpan, or urinal?: A Little Help from another person bathing (including washing, rinsing, drying)?: A Little Help from another person to put on and taking off regular upper body clothing?: A Little Help from another person to put on and taking off regular lower body clothing?: A Little 6 Click Score: 19    End of Session Equipment Utilized During Treatment: Rolling walker;Oxygen  OT Visit Diagnosis: Unsteadiness on feet (R26.81);Muscle weakness (generalized) (M62.81)   Activity Tolerance Patient limited by fatigue;Patient tolerated treatment well   Patient Left in chair;with call bell/phone within reach   Nurse Communication Mobility status;Other (comment)(desats)        Time: [id number] OT Time Calculation  (min): 36 min  Charges: OT General Charges $OT Visit: 1 Visit OT Treatments $Therapeutic Activity: 23-37 mins  Layla Maw, OTR/L   Layla Maw 06/24/2019, 1:39 PM

## 2019-06-24 NOTE — Progress Notes (Signed)
PROGRESS NOTE                                                                                                                                                                                                             Patient Demographics:    Breanna Hernandez, is a 74 y.o. female, DOB - Oct 10, 1945, [id number]  Outpatient Primary MD for the patient is Ann Held, DO   Admit date - 06/21/2019   LOS - 10  Chief Complaint  Patient presents with  . Weakness  . Palpitations       Brief Narrative: Patient is a 74 y.o. female with PMHx of CAD s/p CABG, atrial tachycardia, history of aortic dissection, cancer-s/p lumpectomy, history of endometrial cancer s/p hysterectomy, history of thyroid cancer with postoperative hypothyroidism, HTN, HLD-who presented with fever and shortness of breath-was found to have acute hypoxic respiratory failure.  Hospital course complicated by worsening hypoxemia requiring high flow and NRB oxygen supplementation.  Significant Events: 3/7>> admit to [facility] for hypoxemia and gastroenteritis secondary to COVID-19.  Microbiology data: 3/7>> blood culture: Negative  COVID-19 medications: Steroid: 3/7>> 3/116 Remdesivir: 3/7>> 3/12 Actemra: 3/11 x 1  DVT prophylaxis: Lovenox at therapeutic dosing  Procedures: None  Consults: None    Subjective:    [name] today is still on 11-12 L of HFNC.  She gets short of breath with minimal ambulation.  When she gets short of breath-she requires a NRB in addition to HFNC.   Assessment  & Plan :   Acute Hypoxic Resp Failure due to Covid 19 Viral pneumonia: Continues to have severe hypoxemia requiring 11-12 L of HFNC.  Has finished 5-day course of remdesivir-and 10 days of steroids.  No signs of volume overload-we will hold Lasix today (given on 3/16) due to mild AKI.  Continue supportive care-continue with attempts to slowly titrate down  FiO2.  Fever: afebrile  O2 requirements:  SpO2: 97 % O2 Flow Rate (L/min): 11 L/min   COVID-19 Labs: Recent Labs    06/22/19 0239 06/23/19 0121  CRP 0.8 0.6       Component Value Date/Time   BNP 73.0 06/24/2019 0204    Recent Labs  Lab 06/18/19 0259 06/19/19 0240  PROCALCITON 0.12 0.12    No results found for: SARSCOV2NAA   Prone/Incentive Spirometry: encouraged patient to lie prone  for 3-4 hours at a time for a total of 16 hours a day, and to encourage incentive spirometry use 3-4/hour.  AKI: Mild-likely hemodynamically mediated-did receive Lasix yesterday.  Hold Lasix today-repeat electrolytes tomorrow  Atrial flutter: Rate controlled with metoprolol-full dose anticoagulation with Lovenox currently.  TSH within normal limits. Echo ordered  CAD s/p CABG: No anginal symptoms-continue statin, metoprolol.  Not on aspirin as patient on Lovenox.  History of type I aortic dissection requiring emergent surgical repair in 2012  HLD: Continue statin  Hypothyroidism: Continue Synthroid.  Anxiety/depression: Stable-continue with Zoloft and as needed Ativan.  Obesity: Estimated body mass index is 40.57 kg/m as calculated from the following:   Height as of this encounter: 5\' 4"  (1.626 m).   Weight as of this encounter: 107.2 kg.   ABG:    Component Value Date/Time   PHART 7.396 05/04/2010 0548   PCO2ART 39.5 05/04/2010 0548   PO2ART 64.0 (L) 05/04/2010 0548   HCO3 24.2 (H) 05/04/2010 0548   TCO2 24 05/11/2010 0429   ACIDBASEDEF 1.0 05/04/2010 0548   O2SAT 92.0 05/04/2010 0548    Vent Settings: N/A  Condition -Stable  Family Communication  :  Spouse and Daughter updated over the phone on 3/17.  Note-spouse wants me to update daughter who will be the point of contact with the family.  Code Status :  Full Code  Diet :  Diet Order            Diet Heart Room service appropriate? Yes; Fluid consistency: Thin  Diet effective now               Disposition  Plan  :  Remain hospitalized  Barriers to discharge: Severe Hypoxia requiring O2 supplementation  Antimicorbials  :    Anti-infectives (From admission, onward)   Start     Dose/Rate Route Frequency Ordered Stop   06/16/19 1000  remdesivir 100 mg in sodium chloride 0.9 % 100 mL IVPB     100 mg 200 mL/hr over 30 Minutes Intravenous Daily 06/15/19 0027 06/19/19 1100   06/15/19 1000  remdesivir 100 mg in sodium chloride 0.9 % 100 mL IVPB  Status:  Discontinued     100 mg 200 mL/hr over 30 Minutes Intravenous Daily 06/15/19 0023 06/15/19 0025   06/15/19 0030  remdesivir 200 mg in sodium chloride 0.9% 250 mL IVPB  Status:  Discontinued     200 mg 580 mL/hr over 30 Minutes Intravenous Once 06/15/19 0023 06/15/19 0025   06/15/19 0030  remdesivir 100 mg in sodium chloride 0.9 % 100 mL IVPB     100 mg 200 mL/hr over 30 Minutes Intravenous Every 30 min 06/15/19 0026 06/15/19 0218      Inpatient Medications  Scheduled Meds: . vitamin C  500 mg Oral Daily  . docusate sodium  200 mg Oral BID  . enoxaparin (LOVENOX) injection  110 mg Subcutaneous Q12H  . feeding supplement (ENSURE ENLIVE)  237 mL Oral TID BM  . guaiFENesin  1,200 mg Oral BID  . Ipratropium-Albuterol  1 puff Inhalation TID  . levothyroxine  175 mcg Oral Q0600  . magic mouthwash  5 mL Oral QID  . mouth rinse  15 mL Mouth Rinse BID  . metoprolol tartrate  50 mg Oral BID  . pantoprazole  40 mg Oral Daily  . rosuvastatin  10 mg Oral QHS  . sertraline  50 mg Oral QHS  . traMADol  50 mg Oral Once  . zinc sulfate  220  mg Oral Daily   Continuous Infusions: PRN Meds:.acetaminophen, albuterol, chlorpheniramine-HYDROcodone, guaiFENesin-dextromethorphan, LORazepam, ondansetron **OR** ondansetron (ZOFRAN) IV, phenol   Time Spent in minutes  25  See all Orders from today for further details   Oren Binet M.D on 06/24/2019 at 1:01 PM  To page go to [email or web address] - use universal password  [facility] -  Office   [phone]    Objective:   Vitals:   06/23/19 1652 06/23/19 2137 06/24/19 0608 06/24/19 0937  BP:  (!) 118/58 130/77 124/80  Pulse:  82 100 97  Resp: 19  20 (!) 21  Temp:  98.3 F (36.8 C) 98.6 F (37 C)   TempSrc:      SpO2: 95% (!) 89% 92% 97%  Weight:      Height:        Wt Readings from Last 3 Encounters:  06/23/19 107.2 kg  02/19/19 108.6 kg  12/24/18 107.5 kg     Intake/Output Summary (Last 24 hours) at 06/24/2019 1301 Last data filed at 06/24/2019 0915 Gross per 24 hour  Intake 480 ml  Output 800 ml  Net -320 ml     Physical Exam Gen Exam:Alert awake-not in any distress HEENT:atraumatic, normocephalic Chest: B/L clear to auscultation anteriorly CVS:S1S2 regular Abdomen:soft non tender, non distended Extremities:no edema Neurology: Non focal Skin: no rash   Data Review:    CBC Recent Labs  Lab 06/18/19 0259 06/18/19 0259 06/19/19 0240 06/19/19 0240 06/20/19 0316 06/21/19 0722 06/22/19 0239 06/23/19 0121 06/24/19 0204  WBC 8.9   < > 11.3*   < > 11.1* 13.4* 12.8* 14.5* 14.2*  HGB 12.1   < > 13.9   < > 14.4 15.4* 15.0 14.5 15.0  HCT 35.8*   < > 40.0   < > 41.1 44.0 42.0 40.5 42.0  PLT PLATELET CLUMPS NOTED ON SMEAR, UNABLE TO ESTIMATE   < > 181   < > 240 337 314 288 272  MCV 98.6   < > 97.1   < > 95.4 96.9 94.8 95.7 94.2  MCH 33.3   < > 33.7   < > 33.4 33.9 33.9 34.3* 33.6  MCHC 33.8   < > 34.8   < > 35.0 35.0 35.7 35.8 35.7  RDW 12.0   < > 12.0   < > 11.9 12.2 12.0 12.3 12.3  LYMPHSABS 0.7  --  0.6*  --   --   --   --   --   --   MONOABS 2.0*  --  1.2*  --   --   --   --   --   --   EOSABS 0.0  --  0.0  --   --   --   --   --   --   BASOSABS 0.0  --  0.0  --   --   --   --   --   --    < > = values in this interval not displayed.    Chemistries  Recent Labs  Lab 06/18/19 0259 06/18/19 0259 06/19/19 0240 06/19/19 0240 06/20/19 0316 06/21/19 0722 06/22/19 0239 06/23/19 0121 06/24/19 0204  NA 137   < > 139   < > 139 139 137 136  138  K 3.8   < > 3.5   < > 3.0* 4.7 4.0 3.8 3.6  CL 101   < > 97*   < > 96* 99 99 97* 97*  CO2 24   < > 27   < >  27 26 27 27 26   GLUCOSE 129*   < > 160*   < > 135* 150* 137* 145* 136*  BUN 20   < > 23   < > 31* 43* 42* 38* 42*  CREATININE 0.82   < > 1.02*   < > 1.13* 1.31* 1.17* 1.12* 1.30*  CALCIUM 8.0*   < > 8.0*   < > 7.8* 8.2* 8.6* 8.9 9.3  MG 2.2  --  1.8  --   --  2.1  --   --   --   AST 41   < > 44*   < > 42* 53* 88* 51* 71*  ALT 25   < > 30   < > 28 45* 75* 61* 71*  ALKPHOS 79   < > 89   < > 89 95 113 95 107  BILITOT 1.1   < > 1.2   < > 1.1 1.6* 1.7* 1.6* 2.2*   < > = values in this interval not displayed.   ------------------------------------------------------------------------------------------------------------------ No results for input(s): CHOL, HDL, LDLCALC, TRIG, CHOLHDL, LDLDIRECT in the last 72 hours.  Lab Results  Component Value Date   HGBA1C 5.8 08/17/2014   ------------------------------------------------------------------------------------------------------------------ No results for input(s): TSH, T4TOTAL, T3FREE, THYROIDAB in the last 72 hours.  Invalid input(s): FREET3 ------------------------------------------------------------------------------------------------------------------ No results for input(s): VITAMINB12, FOLATE, FERRITIN, TIBC, IRON, RETICCTPCT in the last 72 hours.  Coagulation profile No results for input(s): INR, PROTIME in the last 168 hours.  No results for input(s): DDIMER in the last 72 hours.  Cardiac Enzymes No results for input(s): CKMB, TROPONINI, MYOGLOBIN in the last 168 hours.  Invalid input(s): CK ------------------------------------------------------------------------------------------------------------------    Component Value Date/Time   BNP 73.0 06/24/2019 0204    Micro Results Recent Results (from the past 240 hour(s))  Blood Culture (routine x 2)     Status: None   Collection Time: 06/12/2019  9:40 PM    Specimen: BLOOD  Result Value Ref Range Status   Specimen Description BLOOD RIGHT ANTECUBITAL  Final   Special Requests   Final    BOTTLES DRAWN AEROBIC AND ANAEROBIC Blood Culture adequate volume   Culture   Final    NO GROWTH 5 DAYS Performed at [facility], [address] [place], [place] [address]    Report Status 06/19/2019 FINAL  Final  Blood Culture (routine x 2)     Status: None   Collection Time: 06/08/2019  9:53 PM   Specimen: BLOOD  Result Value Ref Range Status   Specimen Description BLOOD LEFT ANTECUBITAL  Final   Special Requests   Final    BOTTLES DRAWN AEROBIC AND ANAEROBIC Blood Culture adequate volume   Culture   Final    NO GROWTH 5 DAYS Performed at [facility], [place] [address], [place], [place] [address]    Report Status 06/19/2019 FINAL  Final    Radiology Reports DG Chest Port 1 View  Result Date: 06/23/2019 CLINICAL DATA:  Hypoxia EXAM: PORTABLE CHEST 1 VIEW COMPARISON:  06/20/2019, 06/18/2019, 06/15/2019 FINDINGS: Post sternotomy changes. Enlarged cardiomediastinal silhouette. Likely no significant interval change in patchy bilateral airspace opacities. Aortic atherosclerosis. No pneumothorax. IMPRESSION: 1. Overall no significant interval change in patchy bilateral airspace opacities suspicious for pneumonia. 2. Cardiomegaly Electronically Signed   By: Donavan Foil M.D.   On: 06/23/2019 15:04   DG Chest Port 1 View  Result Date: 06/20/2019 CLINICAL DATA:  Acute hypoxemic respiratory failure due to COVID-19. EXAM: PORTABLE  CHEST 1 VIEW COMPARISON:  06/18/2019 FINDINGS: Hazy bilateral airspace lung opacities are noted in the central and lower lungs consistent with multifocal pneumonia. No convincing pleural effusion.  No pneumothorax. Stable changes from prior CABG surgery. IMPRESSION: 1. Since the previous exam, hazy bilateral airspace lung opacities have developed in the central and lower lungs consistent with multifocal COVID-19 pneumonia.  Electronically Signed   By: Lajean Manes M.D.   On: 06/20/2019 09:17   DG Chest Port 1 View  Result Date: 06/18/2019 CLINICAL DATA:  Hypoxia, COVID-19 positive EXAM: PORTABLE CHEST 1 VIEW COMPARISON:  06/24/2019 chest radiograph. FINDINGS: Intact sternotomy wires. Stable cardiomediastinal silhouette with mild cardiomegaly. No pneumothorax. No pleural effusion. Lungs appear clear, with no acute consolidative airspace disease and no pulmonary edema. IMPRESSION: Stable mild cardiomegaly without pulmonary edema. No active pulmonary disease. Electronically Signed   By: Ilona Sorrel M.D.   On: 06/18/2019 11:33   DG Chest Port 1 View  Result Date: 07/02/2019 CLINICAL DATA:  74 year old female with shortness of breath, hypoxia. EXAM: PORTABLE CHEST 1 VIEW COMPARISON:  Chest CTA 09/24/2017 and earlier. FINDINGS: Portable AP view at 2119 hours. Mildly lower lung volumes. Sequelae of CABG. Stable cardiac size and mediastinal contours. Stable thyroidectomy surgical clips. Increased crowding of lung markings at both bases. Allowing for portable technique the lungs are otherwise clear. No pneumothorax. No acute osseous abnormality identified. IMPRESSION: Lower lung volumes with mild bibasilar atelectasis. Electronically Signed   By: Genevie Ann M.D.   On: 06/22/2019 21:32

## 2019-06-25 ENCOUNTER — Inpatient Hospital Stay (HOSPITAL_COMMUNITY): Payer: Medicare Other

## 2019-06-25 DIAGNOSIS — I351 Nonrheumatic aortic (valve) insufficiency: Secondary | ICD-10-CM

## 2019-06-25 LAB — ECHOCARDIOGRAM LIMITED
Height: 64 in
Weight: 3781.33 oz

## 2019-06-25 LAB — COMPREHENSIVE METABOLIC PANEL
ALT: 48 U/L — ABNORMAL HIGH (ref 0–44)
AST: 51 U/L — ABNORMAL HIGH (ref 15–41)
Albumin: 2.9 g/dL — ABNORMAL LOW (ref 3.5–5.0)
Alkaline Phosphatase: 96 U/L (ref 38–126)
Anion gap: 16 — ABNORMAL HIGH (ref 5–15)
BUN: 43 mg/dL — ABNORMAL HIGH (ref 8–23)
CO2: 27 mmol/L (ref 22–32)
Calcium: 9 mg/dL (ref 8.9–10.3)
Chloride: 97 mmol/L — ABNORMAL LOW (ref 98–111)
Creatinine, Ser: 1.21 mg/dL — ABNORMAL HIGH (ref 0.44–1.00)
GFR calc Af Amer: 51 mL/min — ABNORMAL LOW (ref >60)
GFR calc non Af Amer: 44 mL/min — ABNORMAL LOW (ref >60)
Glucose, Bld: 107 mg/dL — ABNORMAL HIGH (ref 70–99)
Potassium: 3.1 mmol/L — ABNORMAL LOW (ref 3.5–5.1)
Sodium: 140 mmol/L (ref 135–145)
Total Bilirubin: 2.1 mg/dL — ABNORMAL HIGH (ref 0.3–1.2)
Total Protein: 5.9 g/dL — ABNORMAL LOW (ref 6.5–8.1)

## 2019-06-25 LAB — PROCALCITONIN: Procalcitonin: 0.13 ng/mL

## 2019-06-25 LAB — CBC
HCT: 41.6 % (ref 36.0–46.0)
Hemoglobin: 14.7 g/dL (ref 12.0–15.0)
MCH: 33.8 pg (ref 26.0–34.0)
MCHC: 35.3 g/dL (ref 30.0–36.0)
MCV: 95.6 fL (ref 80.0–100.0)
Platelets: 226 10*3/uL (ref 150–400)
RBC: 4.35 MIL/uL (ref 3.87–5.11)
RDW: 12.5 % (ref 11.5–15.5)
WBC: 12.2 10*3/uL — ABNORMAL HIGH (ref 4.0–10.5)
nRBC: 0.6 % — ABNORMAL HIGH (ref 0.0–0.2)

## 2019-06-25 LAB — C-REACTIVE PROTEIN: CRP: <0.5 mg/dL (ref <1.0)

## 2019-06-25 LAB — FERRITIN: Ferritin: 510 ng/mL — ABNORMAL HIGH (ref 11–307)

## 2019-06-25 MED ORDER — POTASSIUM CHLORIDE CRYS ER 20 MEQ PO TBCR
40.0000 meq | EXTENDED_RELEASE_TABLET | Freq: Two times a day (BID) | ORAL | Status: DC
  Administered 2019-06-25: 40 meq via ORAL
  Filled 2019-06-25: qty 2

## 2019-06-25 MED ORDER — POTASSIUM CHLORIDE CRYS ER 20 MEQ PO TBCR
40.0000 meq | EXTENDED_RELEASE_TABLET | Freq: Every day | ORAL | Status: DC
  Administered 2019-06-25: 40 meq via ORAL
  Filled 2019-06-25: qty 2

## 2019-06-25 MED ORDER — LORAZEPAM 2 MG/ML IJ SOLN
1.0000 mg | Freq: Once | INTRAMUSCULAR | Status: AC
  Administered 2019-06-25: 1 mg via INTRAVENOUS
  Filled 2019-06-25: qty 1

## 2019-06-25 MED ORDER — FUROSEMIDE 10 MG/ML IJ SOLN
40.0000 mg | Freq: Once | INTRAMUSCULAR | Status: AC
  Administered 2019-06-25: 40 mg via INTRAVENOUS
  Filled 2019-06-25: qty 4

## 2019-06-25 NOTE — Social Work (Signed)
CSW aware of therapy recommendations for SNF placement. Pt requiring HFNC at current time, not stable for placement. TOC team will follow for appropriate disposition when stable.   Westley Hummer, MSW, [place] [facility]

## 2019-06-25 NOTE — Progress Notes (Signed)
Physical Therapy Treatment Patient Details Name: Breanna Hernandez MRN: [id number] DOB: 03/26/1946 Today's Date: 06/25/2019    History of Present Illness 74 year old female admitted 07/01/2019 with fevers, chills, cough and admitted with respiratory failure and gastroenteritis due to COVID requiring supplmental oxygen. PMH: CAD s/p CABG, atrial tachycardia, aortic dissection, breast cancer, emdometrial cancer, thyroid cancer     PT Comments    Patient on 10L HFNC and >15 NRB upon PT entrance. Nurse noting patient needs to transfer back to bed for echo. Oxygen saturation down to 80-83%, HR 128 bpm, RR 37 with transfer. Oxygen saturation recovered to >/=89% within a few minutes of resting in bed, HR 115 bpm, RR 27. Patient requires minA for transfers and assist for LEs into bed. Discussed with patient possible need for short term rehabilitation when medically ready to discharge from the hospital.   Follow Up Recommendations  SNF;Supervision/Assistance - 24 hour     Equipment Recommendations  Rolling walker with 5" wheels;3in1 (PT);Hospital bed       Precautions / Restrictions Precautions Precautions: Fall;Other (comment) Precaution Comments: monitor oxygen saturation Restrictions Weight Bearing Restrictions: No    Mobility  Bed Mobility Overal bed mobility: Needs Assistance Bed Mobility: Sit to Supine       Sit to supine: Mod assist   General bed mobility comments: assist for LEs into bed  Transfers Overall transfer level: Needs assistance Equipment used: None Transfers: Sit to/from [facility] Sit to Stand: Min assist Stand pivot transfers: Min assist   Ambulation/Gait    General Gait Details: Patient would not be able to tolerate currently. Took a few steps to the bed with minA      Balance Overall balance assessment: Needs assistance Sitting-balance support: Feet supported Sitting balance-Leahy Scale: Good     Standing balance support: No upper  extremity supported;Single extremity supported Standing balance-Leahy Scale: Fair    Cognition Arousal/Alertness: Awake/alert Behavior During Therapy: WFL for tasks assessed/performed     Exercises Other Exercises Other Exercises: Breathing techniques including PLB    General Comments General comments (skin integrity, edema, etc.): Patient on 10L HFNC and >15L NRB, desat to 80-83% with transfer chair>bed. Patient needs to be in bed for echo per nurse. Echo present at end of session.       Pertinent Vitals/Pain Pain Assessment: No/denies pain(No complaints of pain, no signs/symptoms of pain)           PT Goals (current goals can now be found in the care plan section) Progress towards PT goals: Progressing toward goals    Frequency    Min 2X/week      PT Plan Current plan remains appropriate       AM-PAC PT "6 Clicks" Mobility   Outcome Measure  Help needed turning from your back to your side while in a flat bed without using bedrails?: A Little Help needed moving from lying on your back to sitting on the side of a flat bed without using bedrails?: A Lot Help needed moving to and from a bed to a chair (including a wheelchair)?: A Little Help needed standing up from a chair using your arms (e.g., wheelchair or bedside chair)?: A Little Help needed to walk in hospital room?: A Lot Help needed climbing 3-5 steps with a railing? : Total 6 Click Score: 14    End of Session Equipment Utilized During Treatment: Oxygen Activity Tolerance: Patient limited by fatigue Patient left: in bed;with call bell/phone within reach;with bed alarm set Nurse Communication: (  spoke to nurse prior to session) PT Visit Diagnosis: Unsteadiness on feet (R26.81);Other abnormalities of gait and mobility (R26.89)     Time: [id number] PT Time Calculation (min) (ACUTE ONLY): 15 min  Charges:  $Therapeutic Activity: 8-22 mins                    Birdie Hopes, PT, DPT Acute  Rehab [phone] office     Birdie Hopes 06/25/2019, 10:01 AM

## 2019-06-25 NOTE — Progress Notes (Signed)
PROGRESS NOTE                                                                                                                                                                                                             Patient Demographics:    Breanna Hernandez, is a 74 y.o. female, DOB - 12-30-1945, [id number]  Outpatient Primary MD for the patient is Ann Held, DO   Admit date - 06/23/2019   LOS - 57  Chief Complaint  Patient presents with  . Weakness  . Palpitations       Brief Narrative: Patient is a 74 y.o. female with PMHx of CAD s/p CABG, atrial tachycardia, history of aortic dissection, cancer-s/p lumpectomy, history of endometrial cancer s/p hysterectomy, history of thyroid cancer with postoperative hypothyroidism, HTN, HLD-who presented with fever and shortness of breath-was found to have acute hypoxic respiratory failure.  Hospital course complicated by worsening hypoxemia requiring high flow and NRB oxygen supplementation.  Significant Events: 3/7>> admit to [facility] for hypoxemia and gastroenteritis secondary to COVID-19.  Microbiology data: 3/7>> blood culture: Negative  COVID-19 medications: Steroid: 3/7>> 3/116 Remdesivir: 3/7>> 3/12 Actemra: 3/11 x 1  DVT prophylaxis: Lovenox at therapeutic dosing  Procedures: None  Consults: None    Subjective:    Breanna Hernandez remains essentially unchanged-continues to have severe shortness of breath with minimal activity.  Had just moved from bed to bedside chair when I walked in-and was on both 15 L of high flow oxygen and NRB.   Assessment  & Plan :   Acute Hypoxic Resp Failure due to Covid 19 Viral pneumonia: Continues to have severe hypoxemia-requiring anywhere from 10-12 L of high flow oxygen at rest, and requires addition of NRB with minimal movement.  Suspect in the fibrotic stage of ARDS at this point.  No signs of volume overload  today-continue to hold Lasix.  Has completed a course of steroids and remdesivir-CRP unremarkable.  Procalcitonin not elevated today.  Continue supportive care and attempts to titrate down FiO2.  Repeat chest x-ray tomorrow  Fever: afebrile  O2 requirements:  SpO2: (!) 88 % O2 Flow Rate (L/min): 10 L/min FiO2 (%): 100 %   COVID-19 Labs: Recent Labs    06/23/19 0121 06/25/19 0521  FERRITIN  --  510*  CRP 0.6 <0.5  Component Value Date/Time   BNP 73.0 06/24/2019 0204    Recent Labs  Lab 06/19/19 0240 06/25/19 0521  PROCALCITON 0.12 0.13    No results found for: SARSCOV2NAA   Prone/Incentive Spirometry: encouraged patient to lie prone for 3-4 hours at a time for a total of 16 hours a day, and to encourage incentive spirometry use 3-4/hour.  AKI: Mild-likely hemodynamically mediated-improving-hold Lasix.  Atrial flutter: Rate controlled with metoprolol-full dose anticoagulation with Lovenox currently.  TSH within normal limits.  Case was discussed with primary cardiologist Dr. Sallyanne Kuster over the phone on 3/17.  Echo pending  CAD s/p CABG: No anginal symptoms-continue statin, metoprolol.  Not on aspirin as patient on Lovenox.  History of type I aortic dissection requiring emergent surgical repair in 2012  HLD: Continue statin  Hypothyroidism: Continue Synthroid.  Anxiety/depression: Stable-continue with Zoloft and as needed Ativan.  Obesity: Estimated body mass index is 40.57 kg/m as calculated from the following:   Height as of this encounter: 5\' 4"  (1.626 m).   Weight as of this encounter: 107.2 kg.   ABG:    Component Value Date/Time   PHART 7.396 05/04/2010 0548   PCO2ART 39.5 05/04/2010 0548   PO2ART 64.0 (L) 05/04/2010 0548   HCO3 24.2 (H) 05/04/2010 0548   TCO2 24 05/11/2010 0429   ACIDBASEDEF 1.0 05/04/2010 0548   O2SAT 92.0 05/04/2010 0548    Vent Settings: N/A  Condition -Stable  Family Communication  :   Daughter and spouse updated over  the phone on 3/17-left a voicemail for daughter on 3/18  Code Status :  Full Code  Diet :  Diet Order            Diet Heart Room service appropriate? Yes; Fluid consistency: Thin  Diet effective now               Disposition Plan  :  Remain hospitalized  Barriers to discharge: Severe Hypoxia requiring O2 supplementation  Antimicorbials  :    Anti-infectives (From admission, onward)   Start     Dose/Rate Route Frequency Ordered Stop   06/16/19 1000  remdesivir 100 mg in sodium chloride 0.9 % 100 mL IVPB     100 mg 200 mL/hr over 30 Minutes Intravenous Daily 06/15/19 0027 06/19/19 1100   06/15/19 1000  remdesivir 100 mg in sodium chloride 0.9 % 100 mL IVPB  Status:  Discontinued     100 mg 200 mL/hr over 30 Minutes Intravenous Daily 06/15/19 0023 06/15/19 0025   06/15/19 0030  remdesivir 200 mg in sodium chloride 0.9% 250 mL IVPB  Status:  Discontinued     200 mg 580 mL/hr over 30 Minutes Intravenous Once 06/15/19 0023 06/15/19 0025   06/15/19 0030  remdesivir 100 mg in sodium chloride 0.9 % 100 mL IVPB     100 mg 200 mL/hr over 30 Minutes Intravenous Every 30 min 06/15/19 0026 06/15/19 0218      Inpatient Medications  Scheduled Meds: . vitamin C  500 mg Oral Daily  . docusate sodium  200 mg Oral BID  . enoxaparin (LOVENOX) injection  110 mg Subcutaneous Q12H  . feeding supplement (ENSURE ENLIVE)  237 mL Oral TID BM  . guaiFENesin  1,200 mg Oral BID  . Ipratropium-Albuterol  1 puff Inhalation TID  . levothyroxine  175 mcg Oral Q0600  . magic mouthwash  5 mL Oral QID  . mouth rinse  15 mL Mouth Rinse BID  . metoprolol tartrate  50 mg Oral BID  .  pantoprazole  40 mg Oral Daily  . potassium chloride  40 mEq Oral BID  . rosuvastatin  10 mg Oral QHS  . sertraline  50 mg Oral QHS  . traMADol  50 mg Oral Once  . zinc sulfate  220 mg Oral Daily   Continuous Infusions: PRN Meds:.acetaminophen, albuterol, chlorpheniramine-HYDROcodone, guaiFENesin-dextromethorphan,  LORazepam, ondansetron **OR** ondansetron (ZOFRAN) IV, phenol   Time Spent in minutes  25  See all Orders from today for further details   Oren Binet M.D on 06/25/2019 at 2:03 PM  To page go to [email or web address] - use universal password  [facility] -  Office  [phone]    Objective:   Vitals:   06/25/19 0621 06/25/19 0900 06/25/19 1124 06/25/19 1314  BP: (!) 91/44 112/64 112/76 116/61  Pulse: 81 (!) 101 98 96  Resp:  (!) 22 (!) 24 (!) 23  Temp: 97.8 F (36.6 C) 98 F (36.7 C) 98.2 F (36.8 C) 98.2 F (36.8 C)  TempSrc:  Oral Axillary Axillary  SpO2: (!) 83% (!) 89% 90% (!) 88%  Weight:      Height:        Wt Readings from Last 3 Encounters:  06/23/19 107.2 kg  02/19/19 108.6 kg  12/24/18 107.5 kg     Intake/Output Summary (Last 24 hours) at 06/25/2019 1403 Last data filed at 06/24/2019 2150 Gross per 24 hour  Intake 2370 ml  Output 110 ml  Net 2260 ml     Physical Exam Gen Exam:Alert awake-not in any distress HEENT:atraumatic, normocephalic Chest: B/L clear to auscultation anteriorly CVS:S1S2 regular Abdomen:soft non tender, non distended Extremities:no edema Neurology: Non focal Skin: no rash   Data Review:    CBC Recent Labs  Lab 06/19/19 0240 06/20/19 0316 06/21/19 0722 06/22/19 0239 06/23/19 0121 06/24/19 0204 06/25/19 0521  WBC 11.3*   < > 13.4* 12.8* 14.5* 14.2* 12.2*  HGB 13.9   < > 15.4* 15.0 14.5 15.0 14.7  HCT 40.0   < > 44.0 42.0 40.5 42.0 41.6  PLT 181   < > 337 314 288 272 226  MCV 97.1   < > 96.9 94.8 95.7 94.2 95.6  MCH 33.7   < > 33.9 33.9 34.3* 33.6 33.8  MCHC 34.8   < > 35.0 35.7 35.8 35.7 35.3  RDW 12.0   < > 12.2 12.0 12.3 12.3 12.5  LYMPHSABS 0.6*  --   --   --   --   --   --   MONOABS 1.2*  --   --   --   --   --   --   EOSABS 0.0  --   --   --   --   --   --   BASOSABS 0.0  --   --   --   --   --   --    < > = values in this interval not displayed.    Chemistries  Recent Labs  Lab 06/19/19 0240  06/20/19 0316 06/21/19 0722 06/22/19 0239 06/23/19 0121 06/24/19 0204 06/25/19 0521  NA 139   < > 139 137 136 138 140  K 3.5   < > 4.7 4.0 3.8 3.6 3.1*  CL 97*   < > 99 99 97* 97* 97*  CO2 27   < > 26 27 27 26 27   GLUCOSE 160*   < > 150* 137* 145* 136* 107*  BUN 23   < > 43* 42* 38* 42* 43*  CREATININE  1.02*   < > 1.31* 1.17* 1.12* 1.30* 1.21*  CALCIUM 8.0*   < > 8.2* 8.6* 8.9 9.3 9.0  MG 1.8  --  2.1  --   --   --   --   AST 44*   < > 53* 88* 51* 71* 51*  ALT 30   < > 45* 75* 61* 71* 48*  ALKPHOS 89   < > 95 113 95 107 96  BILITOT 1.2   < > 1.6* 1.7* 1.6* 2.2* 2.1*   < > = values in this interval not displayed.   ------------------------------------------------------------------------------------------------------------------ No results for input(s): CHOL, HDL, LDLCALC, TRIG, CHOLHDL, LDLDIRECT in the last 72 hours.  Lab Results  Component Value Date   HGBA1C 5.8 08/17/2014   ------------------------------------------------------------------------------------------------------------------ No results for input(s): TSH, T4TOTAL, T3FREE, THYROIDAB in the last 72 hours.  Invalid input(s): FREET3 ------------------------------------------------------------------------------------------------------------------ Recent Labs    06/25/19 0521  FERRITIN 510*    Coagulation profile No results for input(s): INR, PROTIME in the last 168 hours.  No results for input(s): DDIMER in the last 72 hours.  Cardiac Enzymes No results for input(s): CKMB, TROPONINI, MYOGLOBIN in the last 168 hours.  Invalid input(s): CK ------------------------------------------------------------------------------------------------------------------    Component Value Date/Time   BNP 73.0 06/24/2019 0204    Micro Results No results found for this or any previous visit (from the past 240 hour(s)).  Radiology Reports DG Chest Port 1 View  Result Date: 06/23/2019 CLINICAL DATA:  Hypoxia EXAM:  PORTABLE CHEST 1 VIEW COMPARISON:  06/20/2019, 06/18/2019, 06/13/2019 FINDINGS: Post sternotomy changes. Enlarged cardiomediastinal silhouette. Likely no significant interval change in patchy bilateral airspace opacities. Aortic atherosclerosis. No pneumothorax. IMPRESSION: 1. Overall no significant interval change in patchy bilateral airspace opacities suspicious for pneumonia. 2. Cardiomegaly Electronically Signed   By: Donavan Foil M.D.   On: 06/23/2019 15:04   DG Chest Port 1 View  Result Date: 06/20/2019 CLINICAL DATA:  Acute hypoxemic respiratory failure due to COVID-19. EXAM: PORTABLE CHEST 1 VIEW COMPARISON:  06/18/2019 FINDINGS: Hazy bilateral airspace lung opacities are noted in the central and lower lungs consistent with multifocal pneumonia. No convincing pleural effusion.  No pneumothorax. Stable changes from prior CABG surgery. IMPRESSION: 1. Since the previous exam, hazy bilateral airspace lung opacities have developed in the central and lower lungs consistent with multifocal COVID-19 pneumonia. Electronically Signed   By: Lajean Manes M.D.   On: 06/20/2019 09:17   DG Chest Port 1 View  Result Date: 06/18/2019 CLINICAL DATA:  Hypoxia, COVID-19 positive EXAM: PORTABLE CHEST 1 VIEW COMPARISON:  06/24/2019 chest radiograph. FINDINGS: Intact sternotomy wires. Stable cardiomediastinal silhouette with mild cardiomegaly. No pneumothorax. No pleural effusion. Lungs appear clear, with no acute consolidative airspace disease and no pulmonary edema. IMPRESSION: Stable mild cardiomegaly without pulmonary edema. No active pulmonary disease. Electronically Signed   By: Ilona Sorrel M.D.   On: 06/18/2019 11:33   DG Chest Port 1 View  Result Date: 07/01/2019 CLINICAL DATA:  74 year old female with shortness of breath, hypoxia. EXAM: PORTABLE CHEST 1 VIEW COMPARISON:  Chest CTA 09/24/2017 and earlier. FINDINGS: Portable AP view at 2119 hours. Mildly lower lung volumes. Sequelae of CABG. Stable cardiac  size and mediastinal contours. Stable thyroidectomy surgical clips. Increased crowding of lung markings at both bases. Allowing for portable technique the lungs are otherwise clear. No pneumothorax. No acute osseous abnormality identified. IMPRESSION: Lower lung volumes with mild bibasilar atelectasis. Electronically Signed   By: Genevie Ann M.D.   On: 06/24/2019 21:32

## 2019-06-25 NOTE — NC FL2 (Signed)
[place] LEVEL OF CARE SCREENING TOOL     IDENTIFICATION  Patient Name: Breanna Hernandez Birthdate: [birth year] Sex: female Admission Date (Current Location): 06/10/2019  [facility] and [place] Number:  Herbalist and Address:  [facility] [place], [place] [address] [place], [place] [address]      Provider Number: [id number]  Attending Physician Name and Address:  Jonetta Osgood, MD  Relative Name and Phone Number:       Current Level of Care: Hospital Recommended Level of Care: [place] Prior Approval Number:    Date Approved/Denied:   PASRR Number: [id number]  Discharge Plan: SNF    Current Diagnoses: Patient Active Problem List   Diagnosis Date Noted  . Acute hypoxemic respiratory failure due to COVID-19 ([place]) 06/27/2019  . Atrial tachycardia ([place]) 07/04/2019  . Anxiety and depression 01/02/2019  . Aortic atherosclerosis ([place]) 06/04/2018  . Hypercholesterolemia 06/04/2018  . Status post total replacement of right hip 03/15/2017  . History of malignant neoplasm of thyroid 02/07/2017  . Pre-operative cardiovascular examination 02/07/2017  . Abnormal EKG 02/07/2017  . Unilateral primary osteoarthritis, right hip 12/11/2016  . Pain of right hip joint 12/11/2016  . Bilateral cataracts 08/10/2016  . Numerous moles 05/10/2016  . CAD (coronary artery disease) 11/16/2015  . Papillary thyroid carcinoma ([place]) 11/16/2015  . Quality of life palliative care encounter 11/03/2015  . Elevated liver enzymes 10/06/2014  . Endometrial ca ([place]) 06/24/2014  . History of endometrial cancer 04/26/2014  . Postmenopausal bleeding 04/13/2014  . Neuropathy 10/02/2013  . Exertional chest pain 08/09/2013  . Atrial fibrillation, postoperative only 06/25/2012  . History of aortic dissection   . Dyslipidemia 05/27/2009  . METABOLIC SYNDROME X [id number]  . NONSPEC ELEVATION OF LEVELS OF TRANSAMINASE/LDH 05/27/2009  . Postoperative  hypothyroidism 03/12/2008  . History of breast cancer 03/12/2008  . COLONIC POLYPS, HX OF 03/12/2008  . Essential hypertension 05/09/2006  . Esophageal reflux 05/09/2006    Orientation RESPIRATION BLADDER Height & Weight     Self, Time, Situation, Place  O2(nasal canula) Incontinent, External catheter Weight: 236 lb 5.3 oz (107.2 kg) Height:  5\' 4"  (162.6 cm)  BEHAVIORAL SYMPTOMS/MOOD NEUROLOGICAL BOWEL NUTRITION STATUS      Continent Diet(see discharge summary)  AMBULATORY STATUS COMMUNICATION OF NEEDS Skin   Extensive Assist Verbally Normal                       Personal Care Assistance Level of Assistance  Bathing, Feeding, Dressing Bathing Assistance: Maximum assistance Feeding assistance: Independent Dressing Assistance: Maximum assistance     Functional Limitations Info  Sight, Hearing, Speech Sight Info: Adequate Hearing Info: Adequate Speech Info: Adequate    SPECIAL CARE FACTORS FREQUENCY  OT (By licensed OT), PT (By licensed PT)     PT Frequency: 5x week OT Frequency: 5x week            Contractures Contractures Info: Not present    Additional Factors Info  Code Status, Allergies, Psychotropic Code Status Info: Full Code Allergies Info: No Known Allergies Psychotropic Info: sertraline (ZOLOFT) tablet 50 mg daily at bedtime         Current Medications (06/25/2019):  This is the current hospital active medication list Current Facility-Administered Medications  Medication Dose Route Frequency Provider Last Rate Last Admin  . acetaminophen (TYLENOL) tablet 650 mg  650 mg Oral Q6H PRN Lenore Cordia, MD   650 mg at 06/25/19 0526  . albuterol (  VENTOLIN HFA) 108 (90 Base) MCG/ACT inhaler 1-2 puff  1-2 puff Inhalation Q4H PRN Zada Finders R, MD      . ascorbic acid (VITAMIN C) tablet 500 mg  500 mg Oral Daily Zada Finders R, MD   500 mg at 06/25/19 0913  . chlorpheniramine-HYDROcodone (TUSSIONEX) 10-8 MG/5ML suspension 5 mL  5 mL Oral Q12H PRN  Lenore Cordia, MD   5 mL at 06/20/19 1032  . docusate sodium (COLACE) capsule 200 mg  200 mg Oral BID Thurnell Lose, MD   200 mg at 06/25/19 0913  . enoxaparin (LOVENOX) injection 110 mg  110 mg Subcutaneous Q12H Karren Cobble, RPH   110 mg at 06/25/19 0912  . feeding supplement (ENSURE ENLIVE) (ENSURE ENLIVE) liquid 237 mL  237 mL Oral TID BM Elgergawy, Silver Huguenin, MD   237 mL at 06/25/19 0913  . guaiFENesin (MUCINEX) 12 hr tablet 1,200 mg  1,200 mg Oral BID Elgergawy, Silver Huguenin, MD   1,200 mg at 06/25/19 0912  . guaiFENesin-dextromethorphan (ROBITUSSIN DM) 100-10 MG/5ML syrup 10 mL  10 mL Oral Q4H PRN Lenore Cordia, MD   10 mL at 06/19/19 2203  . Ipratropium-Albuterol (COMBIVENT) respimat 1 puff  1 puff Inhalation TID Elgergawy, Silver Huguenin, MD   1 puff at 06/25/19 0914  . levothyroxine (SYNTHROID) tablet 175 mcg  175 mcg Oral Q0600 Lenore Cordia, MD   175 mcg at 06/25/19 0525  . LORazepam (ATIVAN) tablet 0.5 mg  0.5 mg Oral Q6H PRN Elgergawy, Silver Huguenin, MD   0.5 mg at 06/25/19 0526  . magic mouthwash  5 mL Oral QID Elgergawy, Silver Huguenin, MD   5 mL at 06/25/19 0912  . MEDLINE mouth rinse  15 mL Mouth Rinse BID Thurnell Lose, MD   15 mL at 06/25/19 0914  . metoprolol tartrate (LOPRESSOR) tablet 50 mg  50 mg Oral BID Elgergawy, Silver Huguenin, MD   50 mg at 06/25/19 0913  . ondansetron (ZOFRAN) tablet 4 mg  4 mg Oral Q6H PRN Lenore Cordia, MD   4 mg at 06/18/19 1844   Or  . ondansetron (ZOFRAN) injection 4 mg  4 mg Intravenous Q6H PRN Lenore Cordia, MD   4 mg at 06/20/19 1120  . pantoprazole (PROTONIX) EC tablet 40 mg  40 mg Oral Daily Thurnell Lose, MD   40 mg at 06/25/19 0913  . phenol (CHLORASEPTIC) mouth spray 1 spray  1 spray Mouth/Throat PRN Thurnell Lose, MD   1 spray at 06/16/19 1834  . potassium chloride SA (KLOR-CON) CR tablet 40 mEq  40 mEq Oral BID Jonetta Osgood, MD      . rosuvastatin (CRESTOR) tablet 10 mg  10 mg Oral QHS Thurnell Lose, MD   10 mg at  06/24/19 2300  . sertraline (ZOLOFT) tablet 50 mg  50 mg Oral QHS Bodenheimer, Charles A, NP   50 mg at 06/24/19 2300  . traMADol (ULTRAM) tablet 50 mg  50 mg Oral Once Gardiner Barefoot, NP   Stopped at 06/17/19 2331  . zinc sulfate capsule 220 mg  220 mg Oral Daily Lenore Cordia, MD   220 mg at 06/25/19 [id number]     Discharge Medications: Please see discharge summary for a list of discharge medications.  Relevant Imaging Results:  Relevant Lab Results:   Additional Information SS#[id number] [place], Onset

## 2019-06-25 NOTE — Progress Notes (Signed)
Called to patients room due to humidity issues with the heated high flow cannula. Patient on heated high flow at 100% and 20LPM along with 100% NRB. Patients Sp02 was 72-75%, increased flow to 50LPM with Sp02 increasing to 75%. Patient turned onto prone position.

## 2019-06-25 NOTE — Progress Notes (Signed)
  Echocardiogram 2D Echocardiogram has been performed.  Breanna Hernandez 06/25/2019, 10:30 AM

## 2019-06-26 ENCOUNTER — Encounter (HOSPITAL_COMMUNITY): Payer: Medicare Other

## 2019-06-26 ENCOUNTER — Inpatient Hospital Stay (HOSPITAL_COMMUNITY): Payer: Medicare Other

## 2019-06-26 DIAGNOSIS — J9601 Acute respiratory failure with hypoxia: Secondary | ICD-10-CM

## 2019-06-26 DIAGNOSIS — U071 COVID-19: Secondary | ICD-10-CM

## 2019-06-26 LAB — COMPREHENSIVE METABOLIC PANEL
ALT: 112 U/L — ABNORMAL HIGH (ref 0–44)
ALT: 162 U/L — ABNORMAL HIGH (ref 0–44)
AST: 144 U/L — ABNORMAL HIGH (ref 15–41)
AST: 201 U/L — ABNORMAL HIGH (ref 15–41)
Albumin: 2.9 g/dL — ABNORMAL LOW (ref 3.5–5.0)
Albumin: 2.6 g/dL — ABNORMAL LOW (ref 3.5–5.0)
Alkaline Phosphatase: 149 U/L — ABNORMAL HIGH (ref 38–126)
Alkaline Phosphatase: 142 U/L — ABNORMAL HIGH (ref 38–126)
Anion gap: 19 — ABNORMAL HIGH (ref 5–15)
Anion gap: 21 — ABNORMAL HIGH (ref 5–15)
BUN: 46 mg/dL — ABNORMAL HIGH (ref 8–23)
BUN: 47 mg/dL — ABNORMAL HIGH (ref 8–23)
CO2: 22 mmol/L (ref 22–32)
CO2: 26 mmol/L (ref 22–32)
Calcium: 8.6 mg/dL — ABNORMAL LOW (ref 8.9–10.3)
Calcium: 8.5 mg/dL — ABNORMAL LOW (ref 8.9–10.3)
Chloride: 102 mmol/L (ref 98–111)
Chloride: 100 mmol/L (ref 98–111)
Creatinine, Ser: 2.26 mg/dL — ABNORMAL HIGH (ref 0.44–1.00)
Creatinine, Ser: 2.4 mg/dL — ABNORMAL HIGH (ref 0.44–1.00)
GFR calc Af Amer: 24 mL/min — ABNORMAL LOW (ref >60)
GFR calc Af Amer: 22 mL/min — ABNORMAL LOW (ref >60)
GFR calc non Af Amer: 21 mL/min — ABNORMAL LOW (ref >60)
GFR calc non Af Amer: 19 mL/min — ABNORMAL LOW (ref >60)
Glucose, Bld: 86 mg/dL (ref 70–99)
Glucose, Bld: 62 mg/dL — ABNORMAL LOW (ref 70–99)
Potassium: 4.8 mmol/L (ref 3.5–5.1)
Potassium: 4.6 mmol/L (ref 3.5–5.1)
Sodium: 143 mmol/L (ref 135–145)
Sodium: 147 mmol/L — ABNORMAL HIGH (ref 135–145)
Total Bilirubin: 2.1 mg/dL — ABNORMAL HIGH (ref 0.3–1.2)
Total Bilirubin: 2.1 mg/dL — ABNORMAL HIGH (ref 0.3–1.2)
Total Protein: 6.1 g/dL — ABNORMAL LOW (ref 6.5–8.1)
Total Protein: 5.5 g/dL — ABNORMAL LOW (ref 6.5–8.1)

## 2019-06-26 LAB — POCT I-STAT 7, (LYTES, BLD GAS, ICA,H+H)
Acid-base deficit: 4 mmol/L — ABNORMAL HIGH (ref 0.0–2.0)
Acid-base deficit: 8 mmol/L — ABNORMAL HIGH (ref 0.0–2.0)
Acid-base deficit: 9 mmol/L — ABNORMAL HIGH (ref 0.0–2.0)
Bicarbonate: 26.3 mmol/L (ref 20.0–28.0)
Bicarbonate: 24.9 mmol/L (ref 20.0–28.0)
Bicarbonate: 21.8 mmol/L (ref 20.0–28.0)
Calcium, Ion: 1.13 mmol/L — ABNORMAL LOW (ref 1.15–1.40)
Calcium, Ion: 1.17 mmol/L (ref 1.15–1.40)
Calcium, Ion: 1 mmol/L — ABNORMAL LOW (ref 1.15–1.40)
HCT: 44 % (ref 36.0–46.0)
HCT: 49 % — ABNORMAL HIGH (ref 36.0–46.0)
HCT: 45 % (ref 36.0–46.0)
Hemoglobin: 15 g/dL (ref 12.0–15.0)
Hemoglobin: 16.7 g/dL — ABNORMAL HIGH (ref 12.0–15.0)
Hemoglobin: 15.3 g/dL — ABNORMAL HIGH (ref 12.0–15.0)
O2 Saturation: 75 %
O2 Saturation: 74 %
O2 Saturation: 70 %
Patient temperature: 97.6
Patient temperature: 97.7
Patient temperature: 98.5
Potassium: 3.5 mmol/L (ref 3.5–5.1)
Potassium: 4.2 mmol/L (ref 3.5–5.1)
Potassium: 4.3 mmol/L (ref 3.5–5.1)
Sodium: 141 mmol/L (ref 135–145)
Sodium: 140 mmol/L (ref 135–145)
Sodium: 143 mmol/L (ref 135–145)
TCO2: 28 mmol/L (ref 22–32)
TCO2: 27 mmol/L (ref 22–32)
TCO2: 24 mmol/L (ref 22–32)
pCO2 arterial: 67.7 mmHg (ref 32.0–48.0)
pCO2 arterial: 82.8 mmHg (ref 32.0–48.0)
pCO2 arterial: 69.9 mmHg (ref 32.0–48.0)
pH, Arterial: 7.195 — CL (ref 7.350–7.450)
pH, Arterial: 7.084 — CL (ref 7.350–7.450)
pH, Arterial: 7.101 — CL (ref 7.350–7.450)
pO2, Arterial: 49 mmHg — ABNORMAL LOW (ref 83.0–108.0)
pO2, Arterial: 54 mmHg — ABNORMAL LOW (ref 83.0–108.0)
pO2, Arterial: 51 mmHg — ABNORMAL LOW (ref 83.0–108.0)

## 2019-06-26 LAB — GLUCOSE, CAPILLARY
Glucose-Capillary: 133 mg/dL — ABNORMAL HIGH (ref 70–99)
Glucose-Capillary: 155 mg/dL — ABNORMAL HIGH (ref 70–99)
Glucose-Capillary: 96 mg/dL (ref 70–99)
Glucose-Capillary: 57 mg/dL — ABNORMAL LOW (ref 70–99)
Glucose-Capillary: 106 mg/dL — ABNORMAL HIGH (ref 70–99)
Glucose-Capillary: 53 mg/dL — ABNORMAL LOW (ref 70–99)
Glucose-Capillary: 137 mg/dL — ABNORMAL HIGH (ref 70–99)

## 2019-06-26 LAB — CBC
HCT: 51.5 % — ABNORMAL HIGH (ref 36.0–46.0)
Hemoglobin: 16.6 g/dL — ABNORMAL HIGH (ref 12.0–15.0)
MCH: 34.9 pg — ABNORMAL HIGH (ref 26.0–34.0)
MCHC: 32.2 g/dL (ref 30.0–36.0)
MCV: 108.2 fL — ABNORMAL HIGH (ref 80.0–100.0)
Platelets: 260 10*3/uL (ref 150–400)
RBC: 4.76 MIL/uL (ref 3.87–5.11)
RDW: 13.7 % (ref 11.5–15.5)
WBC: 50.5 10*3/uL (ref 4.0–10.5)
nRBC: 3.6 % — ABNORMAL HIGH (ref 0.0–0.2)

## 2019-06-26 LAB — C-REACTIVE PROTEIN: CRP: 1.3 mg/dL — ABNORMAL HIGH (ref <1.0)

## 2019-06-26 LAB — BRAIN NATRIURETIC PEPTIDE: B Natriuretic Peptide: 273.7 pg/mL — ABNORMAL HIGH (ref 0.0–100.0)

## 2019-06-26 LAB — MAGNESIUM: Magnesium: 2.7 mg/dL — ABNORMAL HIGH (ref 1.7–2.4)

## 2019-06-26 LAB — PHOSPHORUS: Phosphorus: 11.3 mg/dL — ABNORMAL HIGH (ref 2.5–4.6)

## 2019-06-26 LAB — TRIGLYCERIDES: Triglycerides: 177 mg/dL — ABNORMAL HIGH (ref <150)

## 2019-06-26 MED ORDER — MIDAZOLAM HCL 2 MG/2ML IJ SOLN
2.0000 mg | Freq: Once | INTRAMUSCULAR | Status: AC
  Administered 2019-06-26: 2 mg via INTRAVENOUS

## 2019-06-26 MED ORDER — PANTOPRAZOLE SODIUM 40 MG PO PACK
40.0000 mg | PACK | ORAL | Status: DC
  Administered 2019-06-26: 40 mg
  Filled 2019-06-26: qty 20

## 2019-06-26 MED ORDER — ASPIRIN EC 81 MG PO TBEC
81.0000 mg | DELAYED_RELEASE_TABLET | Freq: Every day | ORAL | Status: DC
  Administered 2019-06-26: 81 mg via ORAL
  Filled 2019-06-26: qty 1

## 2019-06-26 MED ORDER — DEXTROSE 50 % IV SOLN
INTRAVENOUS | Status: AC
  Administered 2019-06-26: 50 mL via INTRAVENOUS
  Filled 2019-06-26: qty 50

## 2019-06-26 MED ORDER — FENTANYL BOLUS VIA INFUSION
25.0000 ug | INTRAVENOUS | Status: DC | PRN
  Filled 2019-06-26: qty 25

## 2019-06-26 MED ORDER — ROCURONIUM BROMIDE 50 MG/5ML IV SOLN
70.0000 mg | Freq: Once | INTRAVENOUS | Status: AC
  Administered 2019-06-26: 70 mg via INTRAVENOUS

## 2019-06-26 MED ORDER — FENTANYL CITRATE (PF) 100 MCG/2ML IJ SOLN
INTRAMUSCULAR | Status: AC
  Filled 2019-06-26: qty 2

## 2019-06-26 MED ORDER — VASOPRESSIN 20 UNIT/ML IV SOLN
0.0300 [IU]/min | INTRAVENOUS | Status: DC
  Administered 2019-06-26: 10:00:00 0.03 [IU]/min via INTRAVENOUS
  Filled 2019-06-26: qty 2

## 2019-06-26 MED ORDER — CISATRACURIUM BESYLATE 20 MG/10ML IV SOLN
0.1000 mg/kg | INTRAVENOUS | Status: DC | PRN
  Administered 2019-06-26: 10.8 mg via INTRAVENOUS
  Filled 2019-06-26: qty 10

## 2019-06-26 MED ORDER — MIDAZOLAM HCL 2 MG/2ML IJ SOLN: 1.0000 mg | INTRAMUSCULAR | Status: DC | PRN

## 2019-06-26 MED ORDER — ENOXAPARIN SODIUM 120 MG/0.8ML ~~LOC~~ SOLN: 110.0000 mg | SUBCUTANEOUS | Status: DC

## 2019-06-26 MED ORDER — FENTANYL CITRATE (PF) 100 MCG/2ML IJ SOLN: 25.0000 ug | INTRAMUSCULAR | Status: DC | PRN

## 2019-06-26 MED ORDER — NOREPINEPHRINE 4 MG/250ML-% IV SOLN
0.0000 ug/min | INTRAVENOUS | Status: DC
  Administered 2019-06-26: 12 ug/min via INTRAVENOUS
  Filled 2019-06-26: qty 250

## 2019-06-26 MED ORDER — SODIUM CHLORIDE 0.9 % IV SOLN: INTRAVENOUS | Status: DC | PRN

## 2019-06-26 MED ORDER — ORAL CARE MOUTH RINSE
15.0000 mL | OROMUCOSAL | Status: DC
  Administered 2019-06-26 (×7): 15 mL via OROMUCOSAL

## 2019-06-26 MED ORDER — FENTANYL CITRATE (PF) 100 MCG/2ML IJ SOLN
100.0000 ug | Freq: Once | INTRAMUSCULAR | Status: AC
  Administered 2019-06-26: 100 ug via INTRAVENOUS

## 2019-06-26 MED ORDER — LEVOTHYROXINE SODIUM 75 MCG PO TABS
175.0000 ug | ORAL_TABLET | Freq: Every day | ORAL | Status: DC
  Administered 2019-06-26: 175 ug
  Filled 2019-06-26: qty 1

## 2019-06-26 MED ORDER — SODIUM BICARBONATE 8.4 % IV SOLN
100.0000 meq | Freq: Once | INTRAVENOUS | Status: AC
  Administered 2019-06-26: 100 meq via INTRAVENOUS

## 2019-06-26 MED ORDER — LACTATED RINGERS IV SOLN: INTRAVENOUS | Status: DC

## 2019-06-26 MED ORDER — DOCUSATE SODIUM 50 MG/5ML PO LIQD: 100.0000 mg | Freq: Two times a day (BID) | ORAL | Status: DC | PRN

## 2019-06-26 MED ORDER — DEXTROSE 50 % IV SOLN
INTRAVENOUS | Status: AC
  Filled 2019-06-26: qty 50

## 2019-06-26 MED ORDER — ETOMIDATE 2 MG/ML IV SOLN
20.0000 mg | Freq: Once | INTRAVENOUS | Status: AC
  Administered 2019-06-26: 20 mg via INTRAVENOUS

## 2019-06-26 MED ORDER — MIDAZOLAM HCL 2 MG/2ML IJ SOLN
INTRAMUSCULAR | Status: AC
  Filled 2019-06-26: qty 2

## 2019-06-26 MED ORDER — PRO-STAT SUGAR FREE PO LIQD
30.0000 mL | Freq: Two times a day (BID) | ORAL | Status: DC
  Administered 2019-06-26 (×2): 30 mL
  Filled 2019-06-26 (×2): qty 30

## 2019-06-26 MED ORDER — STERILE WATER FOR INJECTION IV SOLN
INTRAVENOUS | Status: DC
  Filled 2019-06-26 (×2): qty 850

## 2019-06-26 MED ORDER — SERTRALINE HCL 50 MG PO TABS: 50.0000 mg | ORAL_TABLET | Freq: Every day | ORAL | Status: DC

## 2019-06-26 MED ORDER — CHLORHEXIDINE GLUCONATE 0.12% ORAL RINSE (MEDLINE KIT)
15.0000 mL | Freq: Two times a day (BID) | OROMUCOSAL | Status: DC
  Administered 2019-06-26 (×2): 15 mL via OROMUCOSAL

## 2019-06-26 MED ORDER — NOREPINEPHRINE 16 MG/250ML-% IV SOLN
0.0000 ug/min | INTRAVENOUS | Status: DC
  Administered 2019-06-26 (×3): 40 ug/min via INTRAVENOUS
  Filled 2019-06-26 (×3): qty 250

## 2019-06-26 MED ORDER — ACETAMINOPHEN 325 MG PO TABS: 650.0000 mg | ORAL_TABLET | Freq: Four times a day (QID) | ORAL | Status: DC | PRN

## 2019-06-26 MED ORDER — DEXTROSE 50 % IV SOLN
50.0000 mL | Freq: Once | INTRAVENOUS | Status: AC
  Administered 2019-06-26: 50 mL via INTRAVENOUS

## 2019-06-26 MED ORDER — FENTANYL 2500MCG IN NS 250ML (10MCG/ML) PREMIX INFUSION
25.0000 ug/h | INTRAVENOUS | Status: DC
  Administered 2019-06-26: 50 ug/h via INTRAVENOUS
  Filled 2019-06-26: qty 250

## 2019-06-26 MED ORDER — DEXTROSE 50 % IV SOLN: 1.0000 | Freq: Once | INTRAVENOUS | Status: AC

## 2019-06-26 MED ORDER — BISACODYL 10 MG RE SUPP: 10.0000 mg | Freq: Every day | RECTAL | Status: DC | PRN

## 2019-06-26 MED ORDER — ROSUVASTATIN CALCIUM 5 MG PO TABS: 10.0000 mg | ORAL_TABLET | Freq: Every day | ORAL | Status: DC

## 2019-06-26 MED ORDER — CHLORHEXIDINE GLUCONATE CLOTH 2 % EX PADS
6.0000 | MEDICATED_PAD | Freq: Every day | CUTANEOUS | Status: DC
  Administered 2019-06-26: 6 via TOPICAL

## 2019-06-26 MED ORDER — SODIUM BICARBONATE 8.4 % IV SOLN
INTRAVENOUS | Status: AC
  Filled 2019-06-26: qty 100

## 2019-06-26 MED ORDER — ARTIFICIAL TEARS OPHTHALMIC OINT
1.0000 "application " | TOPICAL_OINTMENT | Freq: Three times a day (TID) | OPHTHALMIC | Status: DC
  Administered 2019-06-26 (×2): 1 via OPHTHALMIC
  Filled 2019-06-26: qty 3.5

## 2019-06-26 MED ORDER — PROPOFOL 1000 MG/100ML IV EMUL
0.0000 ug/kg/min | INTRAVENOUS | Status: DC
  Administered 2019-06-26 (×2): 30 ug/kg/min via INTRAVENOUS
  Administered 2019-06-26: 10 ug/kg/min via INTRAVENOUS
  Administered 2019-06-26: 30 ug/kg/min via INTRAVENOUS
  Filled 2019-06-26 (×4): qty 100

## 2019-06-26 MED ORDER — VITAL HIGH PROTEIN PO LIQD
1000.0000 mL | ORAL | Status: DC
  Administered 2019-06-26: 1000 mL

## 2019-06-26 MED ORDER — FENTANYL CITRATE (PF) 100 MCG/2ML IJ SOLN
25.0000 ug | Freq: Once | INTRAMUSCULAR | Status: AC
  Administered 2019-06-26: 25 ug via INTRAVENOUS
  Filled 2019-06-26: qty 2

## 2019-07-09 NOTE — Discharge Summary (Signed)
   NAME:  Breanna Hernandez, MRN:  [id number], DOB:  07-09-1945, LOS: 12 ADMISSION DATE:  06/25/2019, CONSULTATION DATE:  2019/07/03 REFERRING MD:  Blain Pais, [facility], CHIEF COMPLAINT:  Short of breath   Brief History   74 yo female presented with weakness, cough, n/v/d, headache and fever.  Found to have acute hypoxic respiratory failure from COVID 19 pneumonia.  Developed progressive hypoxia and transferred to ICU July 03, 2022.  Past Medical History  Thyroid cancer s/p thyroidectomy, Hypothyroidism, IBS, HLD, HTN, HA, Aortic dissection, GERD, Fatty liver, A fib, Breast cancer, OA, CAD c/ CABG  Significant Hospital Events   3/07 Admit 3/11 Increased O2 needs, treated with tocilizumab 3/18 worsening hypoxia, transfer to ICU, intubated  Consults:    Procedures:  ETT 07/03/22 >> A line 07/03/2022 >> CVL 07-03-22 >>   Significant Diagnostic Tests:  Echo 3/18 >> dilated RV, flattened septum, EF 55 to 60%  Micro Data:  SARS CoV2 Ag 3/07 >> positive Sputum Jul 03, 2022 >>  COVID Therapy:  Decadron 3/07 >> 3/16 Remdesivir 3/07 >> 3/12 Tocilizumab 3/11  Antimicrobials:     A/P/ Course    Admitted 3/7, developed progressive hypoxia and intubated 3/18 She was sedated, paralyzed in prone but in spite of this hypoxia has been persistent with saturations not improving above 72%  ARDS -increased PEEP to 18, peak pressure increases to 45, driving pressure is high in spite of low tidal volume ventilation. -Add bicarbonate drip to improve acidosis, respiratory rate maximized to 35 -She is not a candidate for ECMO -Echo showed dilated RV, given prior history of thoracic dissection & has been on therapeutic lovenox,we will not give TPA empirically, await Doppler legs, continue treatment dose Lovenox, will consider change to IV heparin   COVID-19 positive test (U07.1, COVID-19) with Acute Pneumonia (J12.89, Other viral pneumonia) -Received Decadron, remdesivir and toci  Septic shock -on max Levophed, add  vasopressin Broad-spectrum antibiotics  AKI -added bicarb drip  Unfortunately prognosis was very poor given sustained hypoxia and now multiorgan failure .  Updated family at bedside, change CODE STATUS to no CPR/no cardioversion request She passed away at 1740 on 07/03/2022  Cause of death - Septic shock, COvid pneumonia, ARDS, AKI   Kara Mead MD. FCCP. [facility]    06/29/2019

## 2019-07-09 NOTE — Significant Event (Signed)
Patient COVID positive. Admitted 3/7. Notified of progressive hypoxia. Now requiring Head High Flow at 100% and 50L with NRB in place. Oxygen saturation 74%. Critical Care consulted. Patient to be tranferred to 44M.

## 2019-07-09 NOTE — Progress Notes (Signed)
Attempted lower extremity venous duplex exam. Patient prone and not doing well.  Per Mendel Ryder, RN try tomorrow morning.

## 2019-07-09 NOTE — Procedures (Signed)
Central venous Catheter Insertion Procedure Note Shaday Kniceley [id number] 07-Nov-1945  Procedure: Insertion of Central Venous Catheter Indications: Assessment of intravascular volume, Drug and/or fluid administration and Frequent blood sampling  Procedure Details Consent: Risks of procedure as well as the alternatives and risks of each were explained to the (patient/caregiver).  Consent for procedure obtained. Time Out: Verified patient identification, verified procedure, site/side was marked, verified correct patient position, special equipment/implants available, medications/allergies/relevent history reviewed, required imaging and test results available.  Performed  Maximum sterile technique was used including antiseptics, cap, gloves, gown, hand hygiene, mask and sheet. Skin prep: Chlorhexidine; local anesthetic administered A antimicrobial bonded/coated triple lumen catheter was placed in the left internal jugular vein using the Seldinger technique. Catheter placed to 20 cm. Blood aspirated via all 3 ports and then flushed x 3. Line sutured x 2 and dressing applied.  Ultrasound guidance used.Yes.    Evaluation Blood flow good Complications: No apparent complications Patient did tolerate procedure well. Chest X-ray ordered to verify placement.  CXR: pending.   Georgann Housekeeper, AGACNP-BC [facility]  See Amion for personal pager PCCM on call pager [phone]  Jul 13, 2019 1:19 AM

## 2019-07-09 NOTE — Progress Notes (Signed)
Critical ABG results given to RN.  Results for Breanna Hernandez, Breanna Hernandez (MRN [id number]) as of Jun 27, 2019 13:23  Ref. Range 06-27-2019 12:46  Sample type Unknown ARTERIAL  pH, Arterial Latest Ref Range: 7.350 - 7.450  7.101 (LL)  pCO2 arterial Latest Ref Range: 32.0 - 48.0 mmHg 69.9 (HH)  pO2, Arterial Latest Ref Range: 83.0 - 108.0 mmHg 51.0 (L)  TCO2 Latest Ref Range: 22 - 32 mmol/L 24  Acid-base deficit Latest Ref Range: 0.0 - 2.0 mmol/L 9.0 (H)  Bicarbonate Latest Ref Range: 20.0 - 28.0 mmol/L 21.8  O2 Saturation Latest Units: % 70.0  Patient temperature Unknown 98.5 F  Sodium Latest Ref Range: 135 - 145 mmol/L 143  Potassium Latest Ref Range: 3.5 - 5.1 mmol/L 4.3  Calcium Ionized Latest Ref Range: 1.15 - 1.40 mmol/L 1.00 (L)  Hemoglobin Latest Ref Range: 12.0 - 15.0 g/dL 15.3 (H)  HCT Latest Ref Range: 36.0 - 46.0 % 45.0

## 2019-07-09 NOTE — Progress Notes (Signed)
Patient passed away at 1740, family aware of passing.  No pupil reaction, no heartbeat, no breath sounds X 2 Nurses, Cecilio Asper, RN and Geroge Baseman, RN All Lines and Tubes removed. Patient has a silver and gold watch on her right wrist. Patient belongings have been given to family Patient has been prepared for transport to [facility].

## 2019-07-09 NOTE — Procedures (Signed)
Arterial Catheter Insertion Procedure Note Walter Greig [id number] 02/10/1946  Procedure: Insertion of Arterial Catheter  Indications: Blood pressure monitoring and Frequent blood sampling  Procedure Details Consent: Unable to obtain consent because of altered level of consciousness. Time Out: Verified patient identification, verified procedure, site/side was marked, verified correct patient position, special equipment/implants available, medications/allergies/relevent history reviewed, required imaging and test results available.  Performed  Maximum sterile technique was used including antiseptics, cap, gloves, gown, hand hygiene, mask and sheet. Skin prep: Chlorhexidine; local anesthetic administered 20 gauge catheter was inserted into right radial artery using the Seldinger technique. ULTRASOUND GUIDANCE USED: NO Evaluation Blood flow good; BP tracing good. Complications: No apparent complications   Placed per MD verbal .   Carolann Littler [facility] 2019-06-30

## 2019-07-09 NOTE — Progress Notes (Signed)
Initial Nutrition Assessment  DOCUMENTATION CODES:   Obesity unspecified  INTERVENTION:   D/C enteral nutrition while MAP remains < 50  Please re-consult for TF initiation when BP stabilizes.   NUTRITION DIAGNOSIS:   Increased nutrient needs related to catabolic [id number] as evidenced by estimated needs.  GOAL:   Provide needs based on ASPEN/SCCM guidelines  MONITOR:   Vent status, I & O's  REASON FOR ASSESSMENT:   Consult, Ventilator Enteral/tube feeding initiation and management  ASSESSMENT:   Pt with PMH of thyroid cancer s/p thyroidectomy, IBS, HLD, HTN, aortic dissection, GERD, CAD who was admitted 3/7 with acute respiratory failure and gastroenteritis due to COVID-19.    Family at bedside during my visit. Spoke with RN. Pt maxed on levo and on vaso as well. MAP sustaining < 30 now. O2 is 72% on 100% FIO2 Per CCM poor prognosis.  Asked RN to stop enteral feeds due to low BP/MAP.   3/11 increasing O2 needs 3/18 worsening hypoxia, transferred to ICU and intubated  Patient is currently intubated on ventilator support MV: 12.6 L/min Temp (24hrs), Avg:98.2 F (36.8 C), Min:97.6 F (36.4 C), Max:99.2 F (37.3 C)  Propofol: 19 ml/hr provides: 501 kcal  Medications reviewed and include:  Quad levo @ 40 mcg Vasopressin @ .03 units Nabicarb @ 75 ml/hr Labs reviewed:  CBG's: 57-106 14 F OG tube  MAP: 45-76  TF: Vital High Protein @ 40 ml/hr with 30 ml Prostat BID Provides: 1160 kcal and 114 grams protein  NUTRITION - FOCUSED PHYSICAL EXAM:  Deferred, family visiting patient  Diet Order:   Diet Order            Diet NPO time specified  Diet effective now              EDUCATION NEEDS:   No education needs have been identified at this time  Skin:  Skin Assessment: Reviewed RN Assessment  Last BM:  3/18 large, type 4  Height:   Ht Readings from Last 1 Encounters:  07-09-2019 5\' 5"  (1.651 m)    Weight:   Wt Readings from Last 1  Encounters:  06/23/19 107.2 kg    Ideal Body Weight:  56.8 kg  BMI:  Body mass index is 39.33 kg/m.  Estimated Nutritional Needs:   Kcal:  1667  Protein:  160 grams  Fluid:  > 1.6 L/day  Lockie Pares., RD, LDN, CNSC See AMiON for contact information

## 2019-07-09 NOTE — Procedures (Signed)
Intubation Procedure Note Breanna Hernandez [id number] August 07, 1945  Procedure: Intubation Indications: Respiratory insufficiency  Procedure Details Consent: Risks of procedure as well as the alternatives and risks of each were explained to the (patient/caregiver).  Consent for procedure obtained. Time Out: Verified patient identification, verified procedure, site/side was marked, verified correct patient position, special equipment/implants available, medications/allergies/relevent history reviewed, required imaging and test results available.  Performed  Maximum sterile technique was used including cap, gloves, gown, hand hygiene and mask.  MAC and 4  Give 2 mg versed, 100 mcg fentanyl, 20 mg etomidate, 70 mg rocuronium.  Inserted 7.5 ETT to 23 cm at lip using video laryngoscope w/o difficulty.  Confirmed with CO2 detector and ausculation.  Evaluation Hemodynamic Status: BP stable throughout; O2 sats: Persistently low. Patient's Current Condition: stable Complications: No apparent complications Patient did tolerate procedure well. Chest X-ray ordered to verify placement.  CXR: pending.   Breanna Mires, MD [facility] Pulmonary/Critical Care 06/28/19, 12:38 AM

## 2019-07-09 NOTE — Significant Event (Addendum)
Rapid Response Event Note  Overview:Called by RT d/t increased FiO2 demands. Pt has been on [place] 20L 100% and NRB with SpO2 in the upper 80s throughout shift. On RT arrival, pt SpO2 was 72%. RT increased pt to 50L with SpO2 increasing to 75%. RT then proned pt with no changed in SpO2.  Time Called: 2350 Arrival Time: 2356 Event Type: Respiratory  Initial Focused Assessment: Pt laying in bed in prone position, alert, will respond in one-word answers. HR-139, BP-107/72, RR-50, SpO2-74% on 50L 100% HHFNC and NRB.   Interventions: Pt placed on NRB at 25L and HFNC at 25L and emergently transferred in prone position to 3M07 with RN, RRT, and RT where pt was immediately intubated. SpO2 during transferred 73-75%.   Event Summary: Name of Physician Notified: Dewaine Oats, NP at 2359    at  [place], [place] at 0001  Outcome: Transferred (Comment)(3M07)  Event End Time: 0100  Dillard [place]

## 2019-07-09 NOTE — Consult Note (Signed)
NAME:  Breanna Hernandez, MRN:  [id number], DOB:  [birth year], LOS: 12 ADMISSION DATE:  07/07/2019, CONSULTATION DATE:  July 16, 2019 REFERRING MD:  Blain Pais, [place], CHIEF COMPLAINT:  Short of breath   Brief History   74 yo female presented with weakness, cough, n/v/d, headache and fever.  Found to have acute hypoxic respiratory failure from COVID 19 pneumonia.  Developed progressive hypoxia and transferred to ICU 3/19.  Past Medical History  Thyroid cancer s/p thyroidectomy, Hypothyroidism, IBS, HLD, HTN, HA, Aortic dissection, GERD, Fatty liver, A fib, Breast cancer, OA, CAD c/ CABG  Significant Hospital Events   3/07 Admit 3/11 Increased O2 needs, treated with tocilizumab 3/18 worsening hypoxia, transfer to ICU, intubated  Consults:    Procedures:  ETT 3/19 >> A line 3/19 >> CVL 3/19 >>   Significant Diagnostic Tests:  Echo 3/18 >> dilated RV, flattened septum, EF 55 to 60%  Micro Data:  SARS CoV2 Ag 3/07 >> positive Sputum 3/19 >>  COVID Therapy:  Decadron 3/07 >> 3/16 Remdesivir 3/07 >> 3/12 Tocilizumab 3/11  Antimicrobials:    Interim history/subjective:    Objective   Blood pressure 107/72, pulse (!) 149, temperature 98.1 F (36.7 C), temperature source Axillary, resp. rate 20, height 5\' 5"  (1.651 m), weight 107.2 kg, SpO2 (!) 84 %.    Vent Mode: PRVC FiO2 (%):  [100 %] 100 % Set Rate:  [28 bmp] 28 bmp Vt Set:  [350 mL] 350 mL PEEP:  [14 cmH20] 14 cmH20   Intake/Output Summary (Last 24 hours) at 07-16-2019 0045 Last data filed at 06/25/2019 1800 Gross per 24 hour  Intake 240 ml  Output --  Net 240 ml   Filed Weights   06/24/2019 2018 06/19/19 0547 06/23/19 0323  Weight: 111.1 kg 108.8 kg 107.2 kg    Examination:  General - anxious, using accessory muscles Eyes - pupils reactive ENT - dry mucosa Cardiac - irregular, tachycardic Chest - poor air movement b/l, decreased BS Abdomen - soft, non tender, + bowel sounds Extremities - 1+ edema Skin - no  rashes Neuro - follows commands   Resolved Hospital Problem list     Assessment & Plan:   Acute hypoxic respiratory failure with ARDS from COVID 19 pneumonia. - ARDS protocol - prone positioning if PaO2:FiO2 < 150 - goal plateau pressure < 30, driving pressure < 15 - f/u CXR, ABG - sputum culture - f/u CXR, ABG - has already received decadron, remdesivir, tocilizumab  Echo changes suggestive of acute PE. - not stable to go to radiology for CTA - continue full dose lovenox - pressors to keep MAP > 65 - check doppler legs  Hx of A fib/flutter. Hx of CAD s/p CABG, HTN, HLD. - continue ASA, crestor, lovenox - hold lopressor  Acute metabolic encephalopathy 2nd to hypoxia, COVID. Hx of depression. - RASS goal -2 to -3 - continue zoloft  Hypothyroidism. - continue synthroid   Best practice:  Diet: tube feeds DVT prophylaxis: Lovenox GI prophylaxis: Protonix Mobility: Bed rest Code Status: Full code Disposition: ICU  Labs   CBC: Recent Labs  Lab 06/19/19 0240 06/20/19 0316 06/21/19 0722 06/22/19 0239 06/23/19 0121 06/24/19 0204 06/25/19 0521  WBC 11.3*   < > 13.4* 12.8* 14.5* 14.2* 12.2*  NEUTROABS 9.1*  --   --   --   --   --   --   HGB 13.9   < > 15.4* 15.0 14.5 15.0 14.7  HCT 40.0   < > 44.0 42.0 40.5  42.0 41.6  MCV 97.1   < > 96.9 94.8 95.7 94.2 95.6  PLT 181   < > 337 314 288 272 226   < > = values in this interval not displayed.    Basic Metabolic Panel: Recent Labs  Lab 06/19/19 0240 06/20/19 0316 06/21/19 0722 06/22/19 0239 06/23/19 0121 06/24/19 0204 06/25/19 0521  NA 139   < > 139 137 136 138 140  K 3.5   < > 4.7 4.0 3.8 3.6 3.1*  CL 97*   < > 99 99 97* 97* 97*  CO2 27   < > 26 27 27 26 27   GLUCOSE 160*   < > 150* 137* 145* 136* 107*  BUN 23   < > 43* 42* 38* 42* 43*  CREATININE 1.02*   < > 1.31* 1.17* 1.12* 1.30* 1.21*  CALCIUM 8.0*   < > 8.2* 8.6* 8.9 9.3 9.0  MG 1.8  --  2.1  --   --   --   --    < > = values in this interval  not displayed.   GFR: Estimated Creatinine Clearance: 48.9 mL/min (A) (by C-G formula based on SCr of 1.21 mg/dL (H)). Recent Labs  Lab 06/19/19 0240 06/20/19 0316 06/22/19 0239 06/23/19 0121 06/24/19 0204 06/25/19 0521  PROCALCITON 0.12  --   --   --   --  0.13  WBC 11.3*   < > 12.8* 14.5* 14.2* 12.2*   < > = values in this interval not displayed.    Liver Function Tests: Recent Labs  Lab 06/21/19 0722 06/22/19 0239 06/23/19 0121 06/24/19 0204 06/25/19 0521  AST 53* 88* 51* 71* 51*  ALT 45* 75* 61* 71* 48*  ALKPHOS 95 113 95 107 96  BILITOT 1.6* 1.7* 1.6* 2.2* 2.1*  PROT 7.1 7.0 6.6 6.4* 5.9*  ALBUMIN 3.3* 3.2* 3.0* 3.0* 2.9*   No results for input(s): LIPASE, AMYLASE in the last 168 hours. No results for input(s): AMMONIA in the last 168 hours.  ABG    Component Value Date/Time   PHART 7.396 05/04/2010 0548   PCO2ART 39.5 05/04/2010 0548   PO2ART 64.0 (L) 05/04/2010 0548   HCO3 24.2 (H) 05/04/2010 0548   TCO2 24 05/11/2010 0429   ACIDBASEDEF 1.0 05/04/2010 0548   O2SAT 92.0 05/04/2010 0548     Coagulation Profile: No results for input(s): INR, PROTIME in the last 168 hours.  Cardiac Enzymes: No results for input(s): CKTOTAL, CKMB, CKMBINDEX, TROPONINI in the last 168 hours.  HbA1C: Hgb A1c MFr Bld  Date/Time Value Ref Range Status  08/17/2014 11:34 AM 5.8 4.6 - 6.5 % Final    Comment:    Glycemic Control Guidelines for People with Diabetes:Non Diabetic:  <6%Goal of Therapy: <7%Additional Action Suggested:  >8%   09/22/2013 09:57 AM 5.8 4.6 - 6.5 % Final    Comment:    Glycemic Control Guidelines for People with Diabetes:Non Diabetic:  <6%Goal of Therapy: <7%Additional Action Suggested:  >8%     CBG: No results for input(s): GLUCAP in the last 168 hours.  Review of Systems:   Reviewed and negative  Past Medical History  She,  has a past medical history of Arthritis, Atrial fibrillation ([place]), Breast cancer ([place]), Diverticulosis, Dysrhythmia,  Fatty liver (01/10/07), GERD (gastroesophageal reflux disease), Headache, History of aortic dissection (05/03/10), HTN (hypertension), Hyperlipidemia, Hyperplastic colon polyp, Hypothyroidism, IBS (irritable bowel syndrome), Lower extremity neuropathy, Obesity, Renal cyst, left, and Thyroid cancer ([place]) (11/2015).   Surgical History  Past Surgical History:  Procedure Laterality Date  . BREAST LUMPECTOMY  2000   Right x2, Left x1  . CHOLECYSTECTOMY    . COLONOSCOPY W/ POLYPECTOMY    . CORONARY ARTERY BYPASS GRAFT  05/03/2010   CABG X1, RCA -Dr Roxan Hockey (with type 1 aortic dissection)  . HYSTEROSCOPY WITH D & C N/A 04/13/2014   Procedure: DILATATION AND CURETTAGE /HYSTEROSCOPY Polypectomy;  Surgeon: Maeola Sarah. Landry Mellow, MD;  Location: [facility];  Service: Gynecology;  Laterality: N/A;  Possible Polypectomy  . JOINT REPLACEMENT     Right total hip 03-15-17 Dr. Ninfa Linden  . Median sternotomy, extracorporeal circulation, repair of type 1 aortic dissection with  tube graft from  sinotubular junction to take off of innominate artery, distal anastomoses under deep bypothermic circulatory arrest  05/03/2010   Dr Roxan Hockey  . ROBOTIC ASSISTED TOTAL HYSTERECTOMY WITH BILATERAL SALPINGO OOPHERECTOMY Bilateral 05/18/2014   Procedure: ROBOTIC ASSISTED TOTAL HYSTERECTOMY WITH BILATERAL SALPINGO OOPHORECTOMY SENTINAL LYMPH NODE MAPPING ;  Surgeon: Everitt Amber, MD;  Location: [facility];  Service: Gynecology;  Laterality: Bilateral;  . THYROIDECTOMY N/A 01/09/2016   Procedure: TOTAL THYROIDECTOMY;  Surgeon: Armandina Gemma, MD;  Location: [facility];  Service: General;  Laterality: N/A;  . TONSILLECTOMY    . TOTAL HIP ARTHROPLASTY Right 03/15/2017   Procedure: RIGHT TOTAL HIP ARTHROPLASTY ANTERIOR APPROACH;  Surgeon: Mcarthur Rossetti, MD;  Location: [facility];  Service: Orthopedics;  Laterality: Right;  . TOTAL THYROIDECTOMY  01/09/2016     Social History   reports that she has never smoked. She has never used smokeless  tobacco. She reports that she does not drink alcohol or use drugs.   Family History   Her family history includes Alzheimer's disease in her mother; Aneurysm in her father; Goiter in her mother; Heart attack in her father; Hypertension in her brother and mother; Kidney disease in her brother. There is no history of Colon cancer.   Allergies No Known Allergies   Home Medications  Prior to Admission medications   Medication Sig Start Date End Date Taking? Authorizing Provider  acetaminophen (TYLENOL) 500 MG tablet Take 1,000 mg by mouth every 6 (six) hours as needed for mild pain.   Yes [provider]  aspirin EC 81 MG tablet Take 81 mg by mouth daily.   Yes [provider]  benazepril (LOTENSIN) 40 MG tablet TAKE 1 TABLET BY MOUTH EVERY DAY Patient taking differently: Take 40 mg by mouth daily.  02/11/19  Yes Croitoru, Mihai, MD  CALCIUM-MAGNESIUM-VITAMIN D PO Take 1 tablet by mouth 2 (two) times daily.    Yes [provider]  guaiFENesin (MUCINEX) 600 MG 12 hr tablet Take 600 mg by mouth 2 (two) times daily as needed for to loosen phlegm.   Yes [provider]  hydrochlorothiazide (MICROZIDE) 12.5 MG capsule Take 1 capsule (12.5 mg total) by mouth daily. Patient taking differently: Take 25 mg by mouth daily.  10/15/18  Yes Croitoru, Mihai, MD  levothyroxine (SYNTHROID) 175 MCG tablet Take 1 tablet (175 mcg total) by mouth daily before breakfast. 08/12/18  Yes Philemon Kingdom, MD  LORazepam (ATIVAN) 0.5 MG tablet TAKE 1 TABLET (0.5 MG TOTAL) BY MOUTH 2 (TWO) TIMES DAILY AS NEEDED FOR ANXIETY. Patient taking differently: Take 0.5 mg by mouth 2 (two) times daily as needed for anxiety.  04/04/19  Yes Ann Held, DO  Magnesium Oxide 400 MG CAPS Take 400 mg daily 07/30/18  Yes Croitoru, Mihai, MD  metoprolol tartrate (LOPRESSOR) 50 MG  tablet TAKE 1 TABLET BY MOUTH TWICE A DAY. Patient taking differently: Take 50 mg by mouth 2 (two) times daily. TAKE 1  TABLET BY MOUTH TWICE A DAY. 04/02/19  Yes Meng, Isaac Laud, PA  Omega-3 Fatty Acids (FISH OIL PO) Take 1 capsule by mouth 2 (two) times daily.   Yes [provider]  OVER THE COUNTER MEDICATION Take 1 tablet by mouth every morning. Product Name: EHT Mind enhancement formula Vitamin d 200 units Vitamin b 1.6 mg Vitamin b-12 10 mcg Selinum 70 mcg Coffee Extract 35 mg Alpha leporic acid 50 mg huperzine-a 50 mcg   Yes [provider]  rosuvastatin (CRESTOR) 10 MG tablet TAKE 1 TABLET BY MOUTH EVERY DAY Patient taking differently: Take 10 mg by mouth daily.  03/31/19  Yes Roma Schanz R, DO  sertraline (ZOLOFT) 50 MG tablet TAKE 1 TABLET BY MOUTH EVERY DAY Patient taking differently: Take 50 mg by mouth daily.  04/01/19  Yes Roma Schanz R, DO  amLODipine-benazepril (LOTREL) 5-20 MG per capsule Take 1 capsule by mouth daily.    07/03/11  [provider]  escitalopram (LEXAPRO) 10 MG tablet Take 10 mg by mouth daily.    07/03/11  [provider]  lisinopril (PRINIVIL,ZESTRIL) 20 MG tablet Take 20 mg by mouth daily.    07/03/11  [provider]     Critical care time: 43 minutes independent of procedure time.    [name], MD [facility] Pulmonary/Critical Care 07/24/19, 1:04 AM

## 2019-07-09 NOTE — Consult Note (Addendum)
NAME:  Breanna Hernandez, MRN:  [id number], DOB:  February 03, 1946, LOS: 12 ADMISSION DATE:  07/04/2019, CONSULTATION DATE:  07/18/2019 REFERRING MD:  Blain Pais, [facility], CHIEF COMPLAINT:  Short of breath   Brief History   74 yo female presented with weakness, cough, n/v/d, headache and fever.  Found to have acute hypoxic respiratory failure from COVID 19 pneumonia.  Developed progressive hypoxia and transferred to ICU 3/19.  Past Medical History  Thyroid cancer s/p thyroidectomy, Hypothyroidism, IBS, HLD, HTN, HA, Aortic dissection, GERD, Fatty liver, A fib, Breast cancer, OA, CAD c/ CABG  Significant Hospital Events   3/07 Admit 3/11 Increased O2 needs, treated with tocilizumab 3/18 worsening hypoxia, transfer to ICU, intubated  Consults:    Procedures:  ETT 3/19 >> A line 3/19 >> CVL 3/19 >>   Significant Diagnostic Tests:  Echo 3/18 >> dilated RV, flattened septum, EF 55 to 60%  Micro Data:  SARS CoV2 Ag 3/07 >> positive Sputum 3/19 >>  COVID Therapy:  Decadron 3/07 >> 3/16 Remdesivir 3/07 >> 3/12 Tocilizumab 3/11  Antimicrobials:    Interim history/subjective:  Respiratory Acidosis per ABG Unable to tolerate attempted PEEP change to 16 ( Hypotension) P/F Ratio 54 Propofol at 30 mcg/kg/min Levo at 36 mcg/min with hypotension and BP in the [id number] systolic T max [id number] Remains on Lovenox 110 mg Q 12    Objective   Blood pressure (!) 75/65, pulse (!) 136, temperature 99.2 F (37.3 C), temperature source Axillary, resp. rate (!) 36, height 5\' 5"  (1.651 m), weight 107.2 kg, SpO2 (!) 74 %.    Vent Mode: PRVC FiO2 (%):  [100 %] 100 % Set Rate:  [28 bmp-35 bmp] 35 bmp Vt Set:  [350 mL] 350 mL PEEP:  [14 cmH20-16 cmH20] 16 cmH20 Plateau Pressure:  [42 cmH20-43 cmH20] 43 cmH20   Intake/Output Summary (Last 24 hours) at July 18, 2019 0853 Last data filed at 2019/07/18 0600 Gross per 24 hour  Intake 623.37 ml  Output 250 ml  Net 373.37 ml   Filed Weights   06/29/2019 2018  06/19/19 0547 06/23/19 0323  Weight: 111.1 kg 108.8 kg 107.2 kg    Examination:  General - sedated and intubated, prone HEENT: ETT secure and in place, prone Cardiac - S1, S2, irregular, tachycardic, No RMG Chest - Bilateral excursion, poor air movement b/l, decreased BS Abdomen - soft, non tender, + bowel sounds Extremities - 1+ edema, no obvious deformities Skin - no rashes, no lesions Neuro - sedated and intubated   Resolved Hospital Problem list     Assessment & Plan:   Acute hypoxic respiratory failure with ARDS from COVID 19 pneumonia. Worsening Respiratory Acidosis Not tolerating increase in PEEP - ARDS protocol - prone positioning if PaO2:FiO2 < 150 - goal plateau pressure < 30, driving pressure < 15 - sputum culture - Will add bicarb gtt - trend  CXR, ABG prn - has already received decadron, remdesivir, tocilizumab  Echo changes suggestive of acute PE. - not stable to go to radiology for CTA - continue full dose lovenox - pressors to keep MAP > 65 - check doppler legs  Multifactorial Shock with sedation, ARDS  Levo at quad strength and almost maxed out - Will add vasopressin - Continue Levophed - MAP Goal of > 65   Hx of A fib/flutter. Hx of CAD s/p CABG, HTN, HLD. - continue ASA, crestor, lovenox - hold lopressor  Acute metabolic encephalopathy 2nd to hypoxia, COVID. Hx of depression. - RASS goal -2 to -3 -  continue zoloft  Hypothyroid - continue synthroid  Goals of Care Discussed gravity of situation with son and daughter at bedside They are going to speak with their Dad about code status.   Best practice:  Diet: tube feeds DVT prophylaxis: Lovenox GI prophylaxis: Protonix Mobility: Bed rest Code Status: Full code Disposition: ICU  Labs   CBC: Recent Labs  Lab 06/21/19 0722 06/21/19 0722 06/22/19 0239 06/22/19 0239 06/23/19 0121 06/24/19 0204 06/25/19 0521 07-11-19 0149 07/11/19 0508  WBC 13.4*  --  12.8*  --  14.5* 14.2*  12.2*  --   --   HGB 15.4*   < > 15.0   < > 14.5 15.0 14.7 15.0 16.7*  HCT 44.0   < > 42.0   < > 40.5 42.0 41.6 44.0 49.0*  MCV 96.9  --  94.8  --  95.7 94.2 95.6  --   --   PLT 337  --  314  --  288 272 226  --   --    < > = values in this interval not displayed.    Basic Metabolic Panel: Recent Labs  Lab 06/21/19 0722 06/21/19 0722 06/22/19 0239 06/22/19 0239 06/23/19 0121 06/24/19 0204 06/25/19 0521 07/11/2019 0149 Jul 11, 2019 0508  NA 139   < > 137   < > 136 138 140 141 140  K 4.7   < > 4.0   < > 3.8 3.6 3.1* 3.5 4.2  CL 99  --  99  --  97* 97* 97*  --   --   CO2 26  --  27  --  27 26 27   --   --   GLUCOSE 150*  --  137*  --  145* 136* 107*  --   --   BUN 43*  --  42*  --  38* 42* 43*  --   --   CREATININE 1.31*  --  1.17*  --  1.12* 1.30* 1.21*  --   --   CALCIUM 8.2*  --  8.6*  --  8.9 9.3 9.0  --   --   MG 2.1  --   --   --   --   --   --   --   --    < > = values in this interval not displayed.   GFR: Estimated Creatinine Clearance: 48.9 mL/min (A) (by C-G formula based on SCr of 1.21 mg/dL (H)). Recent Labs  Lab 06/22/19 0239 06/23/19 0121 06/24/19 0204 06/25/19 0521  PROCALCITON  --   --   --  0.13  WBC 12.8* 14.5* 14.2* 12.2*    Liver Function Tests: Recent Labs  Lab 06/21/19 0722 06/22/19 0239 06/23/19 0121 06/24/19 0204 06/25/19 0521  AST 53* 88* 51* 71* 51*  ALT 45* 75* 61* 71* 48*  ALKPHOS 95 113 95 107 96  BILITOT 1.6* 1.7* 1.6* 2.2* 2.1*  PROT 7.1 7.0 6.6 6.4* 5.9*  ALBUMIN 3.3* 3.2* 3.0* 3.0* 2.9*   No results for input(s): LIPASE, AMYLASE in the last 168 hours. No results for input(s): AMMONIA in the last 168 hours.  ABG    Component Value Date/Time   PHART 7.084 (LL) 2019/07/11 0508   PCO2ART 82.8 (HH) July 11, 2019 0508   PO2ART 54.0 (L) 11-Jul-2019 0508   HCO3 24.9 2019/07/11 0508   TCO2 27 2019/07/11 0508   ACIDBASEDEF 8.0 (H) 07-11-19 0508   O2SAT 74.0 07-11-19 0508     Coagulation Profile: No results for input(s): INR,  PROTIME  in the last 168 hours.  Cardiac Enzymes: No results for input(s): CKTOTAL, CKMB, CKMBINDEX, TROPONINI in the last 168 hours.  HbA1C: Hgb A1c MFr Bld  Date/Time Value Ref Range Status  08/17/2014 11:34 AM 5.8 4.6 - 6.5 % Final    Comment:    Glycemic Control Guidelines for People with Diabetes:Non Diabetic:  <6%Goal of Therapy: <7%Additional Action Suggested:  >8%   09/22/2013 09:57 AM 5.8 4.6 - 6.5 % Final    Comment:    Glycemic Control Guidelines for People with Diabetes:Non Diabetic:  <6%Goal of Therapy: <7%Additional Action Suggested:  >8%     CBG: Recent Labs  Lab Jul 08, 2019 0135 07/08/2019 0330 07-08-19 0812  GLUCAP 133* 155* 96     Past Medical History  She,  has a past medical history of Arthritis, Atrial fibrillation ([place]), Breast cancer ([place]), Diverticulosis, Dysrhythmia, Fatty liver (01/10/07), GERD (gastroesophageal reflux disease), Headache, History of aortic dissection (05/03/10), HTN (hypertension), Hyperlipidemia, Hyperplastic colon polyp, Hypothyroidism, IBS (irritable bowel syndrome), Lower extremity neuropathy, Obesity, Renal cyst, left, and Thyroid cancer ([place]) (11/2015).   Surgical History    Past Surgical History:  Procedure Laterality Date  . BREAST LUMPECTOMY  2000   Right x2, Left x1  . CHOLECYSTECTOMY    . COLONOSCOPY W/ POLYPECTOMY    . CORONARY ARTERY BYPASS GRAFT  05/03/2010   CABG X1, RCA -Dr Roxan Hockey (with type 1 aortic dissection)  . HYSTEROSCOPY WITH D & C N/A 04/13/2014   Procedure: DILATATION AND CURETTAGE /HYSTEROSCOPY Polypectomy;  Surgeon: Maeola Courteny Egler. Landry Mellow, MD;  Location: [facility];  Service: Gynecology;  Laterality: N/A;  Possible Polypectomy  . JOINT REPLACEMENT     Right total hip 03-15-17 Dr. Ninfa Linden  . Median sternotomy, extracorporeal circulation, repair of type 1 aortic dissection with  tube graft from  sinotubular junction to take off of innominate artery, distal anastomoses under deep bypothermic circulatory arrest  05/03/2010   Dr  Roxan Hockey  . ROBOTIC ASSISTED TOTAL HYSTERECTOMY WITH BILATERAL SALPINGO OOPHERECTOMY Bilateral 05/18/2014   Procedure: ROBOTIC ASSISTED TOTAL HYSTERECTOMY WITH BILATERAL SALPINGO OOPHORECTOMY SENTINAL LYMPH NODE MAPPING ;  Surgeon: Everitt Amber, MD;  Location: [facility];  Service: Gynecology;  Laterality: Bilateral;  . THYROIDECTOMY N/A 01/09/2016   Procedure: TOTAL THYROIDECTOMY;  Surgeon: Armandina Gemma, MD;  Location: [facility];  Service: General;  Laterality: N/A;  . TONSILLECTOMY    . TOTAL HIP ARTHROPLASTY Right 03/15/2017   Procedure: RIGHT TOTAL HIP ARTHROPLASTY ANTERIOR APPROACH;  Surgeon: Mcarthur Rossetti, MD;  Location: [facility];  Service: Orthopedics;  Laterality: Right;  . TOTAL THYROIDECTOMY  01/09/2016     Social History   reports that she has never smoked. She has never used smokeless tobacco. She reports that she does not drink alcohol or use drugs.   Family History   Her family history includes Alzheimer's disease in her mother; Aneurysm in her father; Goiter in her mother; Heart attack in her father; Hypertension in her brother and mother; Kidney disease in her brother. There is no history of Colon cancer.   Allergies No Known Allergies   Home Medications  Prior to Admission medications   Medication Sig Start Date End Date Taking? Authorizing Provider  acetaminophen (TYLENOL) 500 MG tablet Take 1,000 mg by mouth every 6 (six) hours as needed for mild pain.   Yes [provider]  aspirin EC 81 MG tablet Take 81 mg by mouth daily.   Yes [provider]  benazepril (LOTENSIN) 40 MG tablet TAKE 1 TABLET BY MOUTH  EVERY DAY Patient taking differently: Take 40 mg by mouth daily.  02/11/19  Yes Croitoru, Mihai, MD  CALCIUM-MAGNESIUM-VITAMIN D PO Take 1 tablet by mouth 2 (two) times daily.    Yes [provider]  guaiFENesin (MUCINEX) 600 MG 12 hr tablet Take 600 mg by mouth 2 (two) times daily as needed for to loosen phlegm.   Yes [provider]    hydrochlorothiazide (MICROZIDE) 12.5 MG capsule Take 1 capsule (12.5 mg total) by mouth daily. Patient taking differently: Take 25 mg by mouth daily.  10/15/18  Yes Croitoru, Mihai, MD  levothyroxine (SYNTHROID) 175 MCG tablet Take 1 tablet (175 mcg total) by mouth daily before breakfast. 08/12/18  Yes Philemon Kingdom, MD  LORazepam (ATIVAN) 0.5 MG tablet TAKE 1 TABLET (0.5 MG TOTAL) BY MOUTH 2 (TWO) TIMES DAILY AS NEEDED FOR ANXIETY. Patient taking differently: Take 0.5 mg by mouth 2 (two) times daily as needed for anxiety.  04/04/19  Yes Ann Held, DO  Magnesium Oxide 400 MG CAPS Take 400 mg daily 07/30/18  Yes Croitoru, Mihai, MD  metoprolol tartrate (LOPRESSOR) 50 MG tablet TAKE 1 TABLET BY MOUTH TWICE A DAY. Patient taking differently: Take 50 mg by mouth 2 (two) times daily. TAKE 1 TABLET BY MOUTH TWICE A DAY. 04/02/19  Yes Meng, Isaac Laud, PA  Omega-3 Fatty Acids (FISH OIL PO) Take 1 capsule by mouth 2 (two) times daily.   Yes [provider]  OVER THE COUNTER MEDICATION Take 1 tablet by mouth every morning. Product Name: EHT Mind enhancement formula Vitamin d 200 units Vitamin b 1.6 mg Vitamin b-12 10 mcg Selinum 70 mcg Coffee Extract 35 mg Alpha leporic acid 50 mg huperzine-a 50 mcg   Yes [provider]  rosuvastatin (CRESTOR) 10 MG tablet TAKE 1 TABLET BY MOUTH EVERY DAY Patient taking differently: Take 10 mg by mouth daily.  03/31/19  Yes Roma Schanz R, DO  sertraline (ZOLOFT) 50 MG tablet TAKE 1 TABLET BY MOUTH EVERY DAY Patient taking differently: Take 50 mg by mouth daily.  04/01/19  Yes Roma Schanz R, DO  amLODipine-benazepril (LOTREL) 5-20 MG per capsule Take 1 capsule by mouth daily.    07/03/11  [provider]  escitalopram (LEXAPRO) 10 MG tablet Take 10 mg by mouth daily.    07/03/11  [provider]  lisinopril (PRINIVIL,ZESTRIL) 20 MG tablet Take 20 mg by mouth daily.    07/03/11  [provider]      Critical care APP time: 30 minutes     Magdalen Spatz, MSN, AGACNP-BC [place] Pager # [phone] After 4 pm please call [phone] 07/18/2019, 8:53 AM

## 2019-07-09 NOTE — Progress Notes (Signed)
Patient transferred to 3rd floor ICU due to low O2 sats.  Mindy, RN was covering for this Probation officer while on break.  RRT was contacted to adjust the humidity on the [facility] and patient was hypoxic.  Rapid response was initiated and order to transfer was placed.  Report given to ICU nurse at the bedside.  All belongings sent with patient.

## 2019-07-09 NOTE — Progress Notes (Signed)
Patient complained of too much humidity in heated high flow cannula.  Contacted RRT to adjust and assess.

## 2019-07-09 NOTE — Progress Notes (Addendum)
[place] Progress Note Patient Name: Breanna Hernandez DOB: January 17, 1946 MRN: [id number]   Date of Service  07/26/19  HPI/Events of Note  7F with acute hypoxemic respiratory failure secondary to COVID-19 (received toci on 3/11). Developed worsened hypoxemia while on ward and was transferred to ICU and intubated.  ABG after stabilization on initial ventilator settings:  ABG    Component Value Date/Time   PHART 7.195 (LL) 07-26-2019 0149   PCO2ART 67.7 (HH) 07-26-19 0149   PO2ART 49.0 (L) July 26, 2019 0149   HCO3 26.3 2019/07/26 0149   TCO2 28 July 26, 2019 0149   ACIDBASEDEF 4.0 (H) 07/26/19 0149   O2SAT 75.0 07-26-2019 0149   Vent Mode: PRVC FiO2 (%):  [100 %] 100 % Set Rate:  [28 bmp] 28 bmp Vt Set:  [350 mL] 350 mL PEEP:  [14 cmH20] 14 cmH20   Bedside team evaluated patient and opted to prone.   eICU Interventions  # Neuro: - Deep sedation in anticipation of paralytics and proning: prop/fent drips. - Intermittent cisatracurium pushes ordered for NMB.  # Resp: - Increase RR to 32 for settings of 350x32, PEEP 14, FiO2 1.0. - No current room to increase PEEP (peak pressures already too high). Will re-evaluate settings and repeat ABG 1 hour after proning.  # Cardiac: - Levophed drip to maintain MAP. - Concentrate meds where able.   Addendum: - Called both the patient's husband and daughter (daughter is Scientist, research (medical)). Updated them both regarding the patient's condition and, unfortunately, her very poor prognosis. Transferred daughter to eICU RNs to help facilitate the logistics of possible visitation either in-person or via video link.  Intervention Category Evaluation Type: New Patient Evaluation  Marily Lente Teran Knittle July 26, 2019, 2:08 AM

## 2019-07-09 DEATH — deceased
